# Patient Record
Sex: Male | Born: 1935 | Race: White | Hispanic: No | Marital: Married | Smoking: Never smoker
Health system: Southern US, Community
[De-identification: ages and names within clinical notes are randomized; demographics above are authoritative.]

## PROBLEM LIST (undated history)

## (undated) DIAGNOSIS — I499 Cardiac arrhythmia, unspecified: Secondary | ICD-10-CM

## (undated) DIAGNOSIS — R251 Tremor, unspecified: Secondary | ICD-10-CM

## (undated) DIAGNOSIS — C801 Malignant (primary) neoplasm, unspecified: Secondary | ICD-10-CM

## (undated) DIAGNOSIS — E785 Hyperlipidemia, unspecified: Secondary | ICD-10-CM

## (undated) DIAGNOSIS — C61 Malignant neoplasm of prostate: Secondary | ICD-10-CM

## (undated) DIAGNOSIS — Z8719 Personal history of other diseases of the digestive system: Secondary | ICD-10-CM

## (undated) DIAGNOSIS — I10 Essential (primary) hypertension: Secondary | ICD-10-CM

## (undated) DIAGNOSIS — C4490 Unspecified malignant neoplasm of skin, unspecified: Secondary | ICD-10-CM

## (undated) DIAGNOSIS — E039 Hypothyroidism, unspecified: Secondary | ICD-10-CM

## (undated) DIAGNOSIS — C9590 Leukemia, unspecified not having achieved remission: Secondary | ICD-10-CM

## (undated) DIAGNOSIS — T8859XA Other complications of anesthesia, initial encounter: Secondary | ICD-10-CM

## (undated) DIAGNOSIS — K5790 Diverticulosis of intestine, part unspecified, without perforation or abscess without bleeding: Secondary | ICD-10-CM

## (undated) DIAGNOSIS — T4145XA Adverse effect of unspecified anesthetic, initial encounter: Secondary | ICD-10-CM

## (undated) DIAGNOSIS — K219 Gastro-esophageal reflux disease without esophagitis: Secondary | ICD-10-CM

## (undated) HISTORY — DX: Leukemia, unspecified not having achieved remission: C95.90

## (undated) HISTORY — DX: Malignant neoplasm of prostate: C61

## (undated) HISTORY — DX: Unspecified malignant neoplasm of skin, unspecified: C44.90

## (undated) HISTORY — PX: OTHER SURGICAL HISTORY: SHX169

## (undated) HISTORY — PX: BLADDER TUMOR EXCISION: SHX238

## (undated) HISTORY — PX: HEMORRHOID SURGERY: SHX153

## (undated) HISTORY — PX: CARDIAC CATHETERIZATION: SHX172

## (undated) HISTORY — PX: HERNIA REPAIR: SHX51

## (undated) HISTORY — PX: SHOULDER ARTHROSCOPY: SHX128

## (undated) HISTORY — PX: JOINT REPLACEMENT: SHX530

## (undated) NOTE — *Deleted (*Deleted)
[facility]    SUBJECTIVE: ***   Vitals:   12/16/19 1126 12/16/19 1130 12/16/19 1215 12/16/19 1300  BP: 101/65 119/60 100/66 (!) 100/59  Pulse: 66 65 (!) 59 60  Resp: 18 20 20 20   Temp:      TempSrc:      SpO2: 100% 98% 100% 100%  Weight:      Height:        No intake or output data in the 24 hours ending 12/16/19 1500    PHYSICAL EXAM  General: Well developed, well nourished, in no acute distress HEENT:  Normocephalic and atramatic Neck:  No JVD.  Lungs: Clear bilaterally to auscultation and percussion. Heart: HRRR . Normal S1 and S2 without gallops or murmurs.  Abdomen: Bowel sounds are positive, abdomen soft and non-tender  Msk:  Back normal, normal gait. Normal strength and tone for age. Extremities: No clubbing, cyanosis or edema.   Neuro: Alert and oriented X 3. Psych:  Good affect, responds appropriately   LABS: Basic Metabolic Panel: Recent Labs    12/15/19 2006  NA 139  K 4.3  CL 104  CO2 25  GLUCOSE 152*  BUN 36*  CREATININE 1.48*  CALCIUM 10.2   Liver Function Tests: Recent Labs    12/15/19 2006  AST 21  ALT 14  ALKPHOS 65  BILITOT 0.7  PROT 7.2  ALBUMIN 3.8   No results for input(s): LIPASE, AMYLASE in the last 72 hours. CBC: Recent Labs    12/15/19 2006  WBC 6.6  NEUTROABS 3.7  HGB 12.7*  HCT 37.2*  MCV 105.4*  PLT 129*   Cardiac Enzymes: No results for input(s): CKTOTAL, CKMB, CKMBINDEX, TROPONINI in the last 72 hours. BNP: Invalid input(s): POCBNP D-Dimer: No results for input(s): DDIMER in the last 72 hours. Hemoglobin A1C: No results for input(s): HGBA1C in the last 72 hours. Fasting Lipid Panel: No results for input(s): CHOL, HDL, LDLCALC, TRIG, CHOLHDL, LDLDIRECT in the last 72 hours. Thyroid Function Tests: No results for input(s): TSH, T4TOTAL, T3FREE, THYROIDAB in the last 72 hours.  Invalid input(s): FREET3 Anemia Panel: No results for input(s): VITAMINB12, FOLATE, FERRITIN, TIBC, IRON, RETICCTPCT in the  last 72 hours.  DG Chest 2 View  Result Date: 12/15/2019 CLINICAL DATA:  Upper chest pain which began after eating supper EXAM: CHEST - 2 VIEW COMPARISON:  Esophagram 04/13/2019, radiograph 03/31/2017, CT 07/09/2006 FINDINGS: Large air and fluid containing hiatal hernia which appears similar to most recent to soften g in February though certainly increased in size since 2019. The aorta is calcified. The remaining cardiomediastinal contours are unremarkable. Streaky opacities in the lung bases likely reflect areas atelectatic changes adjacent the hiatal hernia. Additional chronically coarsened interstitial changes are present with apical lucency suggesting some emphysematous changes present comparison CT. No convincing features of edema. No pneumothorax or effusion. No acute osseous or soft tissue abnormality. Degenerative changes are present in the imaged spine and shoulders. IMPRESSION: 1. Large hiatal hernia which appears similar to comparison esophagram from February 2021 though increased in size from more remote comparison is. 2. Streaky opacities in the bases, likely atelectasis and/or scarring. 3. No other acute cardiopulmonary abnormality. 4.  Aortic Atherosclerosis (ICD10-I70.0). Electronically Signed   By: Kreg Shropshire M.D.   On: 12/15/2019 20:18   CT Angio Chest PE W and/or Wo Contrast  Result Date: 12/16/2019 CLINICAL DATA:  New onset atrial fibrillation. Dyspnea and chest pain EXAM: CT ANGIOGRAPHY CHEST WITH CONTRAST TECHNIQUE: Multidetector CT imaging of the chest  was performed using the standard protocol during bolus administration of intravenous contrast. Multiplanar CT image reconstructions and MIPs were obtained to evaluate the vascular anatomy. CONTRAST:  75mL OMNIPAQUE IOHEXOL 350 MG/ML SOLN COMPARISON:  None. FINDINGS: Cardiovascular: Contrast injection is sufficient to demonstrate satisfactory opacification of the pulmonary arteries to the segmental level. There is no pulmonary embolus  or evidence of right heart strain. The size of the main pulmonary artery is normal. Heart size is normal, with no pericardial effusion. The course and caliber of the aorta are normal. There is mild atherosclerotic calcification. Opacification decreased due to pulmonary arterial phase contrast bolus timing. Mediastinum/Nodes: Large hiatal hernia.  No adenopathy. Lungs/Pleura: Airways are patent. No pleural effusion, lobar consolidation, pneumothorax or pulmonary infarction. Upper Abdomen: Contrast bolus timing is not optimized for evaluation of the abdominal organs. The visualized portions of the organs of the upper abdomen are normal. Musculoskeletal: No chest wall abnormality. No bony spinal canal stenosis. Review of the MIP images confirms the above findings. IMPRESSION: 1. No pulmonary embolus or other acute thoracic abnormality. 2. Large hiatal hernia. 3.  Aortic atherosclerosis (ICD10-I70.0). Electronically Signed   By: Deatra Robinson M.D.   On: 12/16/2019 02:58     Echo ***  TELEMETRY: ***:  ASSESSMENT AND PLAN:  Principal Problem:   Chest pain Active Problems:   Thrombocytopenia (HCC)   CLL (chronic lymphocytic leukemia) (HCC)   Acquired hypothyroidism   Benign essential hypertension   Chronic kidney disease (CKD) stage G3a/A1, moderately decreased glomerular filtration rate (GFR) between 45-59 mL/min/1.73 square meter and albuminuria creatinine ratio less than 30 mg/g (HCC)   Diabetes mellitus (HCC)   Hiatal hernia   Postural dizziness with presyncope   New onset Atrial fibrillation with slow ventricular response [facility])   Plan   Plan   Alwyn Pea, MD 12/16/2019 3:00 PM

---

## 2003-12-06 ENCOUNTER — Ambulatory Visit: Payer: Self-pay | Admitting: Internal Medicine

## 2003-12-19 ENCOUNTER — Ambulatory Visit: Payer: Self-pay | Admitting: Internal Medicine

## 2004-04-03 ENCOUNTER — Ambulatory Visit: Payer: Self-pay | Admitting: Internal Medicine

## 2004-04-17 ENCOUNTER — Ambulatory Visit: Payer: Self-pay | Admitting: Internal Medicine

## 2004-10-23 ENCOUNTER — Ambulatory Visit: Payer: Self-pay | Admitting: Ophthalmology

## 2005-09-25 ENCOUNTER — Ambulatory Visit: Payer: Self-pay | Admitting: Physician Assistant

## 2006-02-24 ENCOUNTER — Encounter: Payer: Self-pay | Admitting: Internal Medicine

## 2006-03-20 ENCOUNTER — Encounter: Payer: Self-pay | Admitting: Internal Medicine

## 2006-07-09 ENCOUNTER — Ambulatory Visit: Payer: Self-pay | Admitting: Internal Medicine

## 2006-08-04 ENCOUNTER — Emergency Department: Payer: Self-pay | Admitting: Emergency Medicine

## 2006-08-04 ENCOUNTER — Other Ambulatory Visit: Payer: Self-pay

## 2006-10-16 ENCOUNTER — Ambulatory Visit: Payer: Self-pay | Admitting: Surgery

## 2007-01-25 ENCOUNTER — Ambulatory Visit: Payer: Self-pay | Admitting: Unknown Physician Specialty

## 2007-03-03 ENCOUNTER — Ambulatory Visit: Payer: Self-pay | Admitting: Unknown Physician Specialty

## 2007-03-24 ENCOUNTER — Ambulatory Visit: Payer: Self-pay | Admitting: Unknown Physician Specialty

## 2008-05-11 ENCOUNTER — Emergency Department: Payer: Self-pay | Admitting: Emergency Medicine

## 2009-06-06 ENCOUNTER — Ambulatory Visit: Payer: Self-pay | Admitting: Surgery

## 2009-10-05 ENCOUNTER — Emergency Department: Payer: Self-pay | Admitting: Emergency Medicine

## 2010-02-07 ENCOUNTER — Ambulatory Visit: Payer: Self-pay | Admitting: Surgery

## 2010-02-15 ENCOUNTER — Ambulatory Visit: Payer: Self-pay | Admitting: Surgery

## 2010-02-17 DIAGNOSIS — K5790 Diverticulosis of intestine, part unspecified, without perforation or abscess without bleeding: Secondary | ICD-10-CM

## 2010-02-17 HISTORY — DX: Diverticulosis of intestine, part unspecified, without perforation or abscess without bleeding: K57.90

## 2010-02-19 LAB — PATHOLOGY REPORT

## 2010-04-04 ENCOUNTER — Ambulatory Visit: Payer: Self-pay | Admitting: Internal Medicine

## 2010-08-07 ENCOUNTER — Emergency Department: Payer: Self-pay | Admitting: Emergency Medicine

## 2011-06-27 ENCOUNTER — Ambulatory Visit: Payer: Self-pay | Admitting: Surgery

## 2011-06-30 LAB — PATHOLOGY REPORT

## 2012-02-25 ENCOUNTER — Ambulatory Visit: Payer: Self-pay | Admitting: Orthopedic Surgery

## 2013-01-29 LAB — CBC WITH DIFFERENTIAL/PLATELET
Basophil #: 0 10*3/uL (ref 0.0–0.1)
Basophil %: 0.4 %
Eosinophil #: 0.2 10*3/uL (ref 0.0–0.7)
Eosinophil %: 1.4 %
HGB: 13.9 g/dL (ref 13.0–18.0)
Lymphocyte #: 2.6 10*3/uL (ref 1.0–3.6)
Monocyte %: 10.2 %
Neutrophil #: 7.4 10*3/uL — ABNORMAL HIGH (ref 1.4–6.5)
Neutrophil %: 65.5 %
Platelet: 155 10*3/uL (ref 150–440)
RBC: 4.09 10*6/uL — ABNORMAL LOW (ref 4.40–5.90)
RDW: 12.6 % (ref 11.5–14.5)
WBC: 11.4 10*3/uL — ABNORMAL HIGH (ref 3.8–10.6)

## 2013-01-29 LAB — COMPREHENSIVE METABOLIC PANEL
Albumin: 3.3 g/dL — ABNORMAL LOW (ref 3.4–5.0)
Alkaline Phosphatase: 78 U/L
Bilirubin,Total: 0.8 mg/dL (ref 0.2–1.0)
Calcium, Total: 9.3 mg/dL (ref 8.5–10.1)
Chloride: 105 mmol/L (ref 98–107)
Co2: 29 mmol/L (ref 21–32)
Creatinine: 1.33 mg/dL — ABNORMAL HIGH (ref 0.60–1.30)
EGFR (African American): 59 — ABNORMAL LOW
Glucose: 111 mg/dL — ABNORMAL HIGH (ref 65–99)
Total Protein: 7.6 g/dL (ref 6.4–8.2)

## 2013-01-29 LAB — URINALYSIS, COMPLETE
Bilirubin,UR: NEGATIVE
Ketone: NEGATIVE
Leukocyte Esterase: NEGATIVE
Nitrite: NEGATIVE
Ph: 5 (ref 4.5–8.0)
RBC,UR: <1 /HPF (ref 0–5)
Specific Gravity: 1.014 (ref 1.003–1.030)
WBC UR: <1 /HPF (ref 0–5)

## 2013-01-30 LAB — BASIC METABOLIC PANEL
Anion Gap: 2 — ABNORMAL LOW (ref 7–16)
Calcium, Total: 8.3 mg/dL — ABNORMAL LOW (ref 8.5–10.1)
Chloride: 108 mmol/L — ABNORMAL HIGH (ref 98–107)
Co2: 30 mmol/L (ref 21–32)
EGFR (African American): 57 — ABNORMAL LOW
EGFR (Non-African Amer.): 49 — ABNORMAL LOW
Glucose: 101 mg/dL — ABNORMAL HIGH (ref 65–99)
Osmolality: 280 (ref 275–301)
Potassium: 4.4 mmol/L (ref 3.5–5.1)

## 2013-01-30 LAB — CBC WITH DIFFERENTIAL/PLATELET
Basophil #: 0.1 10*3/uL (ref 0.0–0.1)
Basophil %: 0.8 %
HCT: 35.3 % — ABNORMAL LOW (ref 40.0–52.0)
Lymphocyte %: 49 %
MCH: 34.1 pg — ABNORMAL HIGH (ref 26.0–34.0)
MCHC: 33.4 g/dL (ref 32.0–36.0)
MCV: 102 fL — ABNORMAL HIGH (ref 80–100)
Monocyte #: 0.7 x10 3/mm (ref 0.2–1.0)
Neutrophil #: 2.2 10*3/uL (ref 1.4–6.5)
Neutrophil %: 34.6 %
Platelet: 122 10*3/uL — ABNORMAL LOW (ref 150–440)
RBC: 3.46 10*6/uL — ABNORMAL LOW (ref 4.40–5.90)
RDW: 12.5 % (ref 11.5–14.5)
WBC: 6.4 10*3/uL (ref 3.8–10.6)

## 2013-01-31 ENCOUNTER — Inpatient Hospital Stay: Payer: Self-pay | Admitting: Internal Medicine

## 2013-01-31 LAB — CBC WITH DIFFERENTIAL/PLATELET
Basophil #: 0.1 10*3/uL (ref 0.0–0.1)
Basophil %: 1.1 %
Eosinophil %: 5.2 %
HCT: 34.1 % — ABNORMAL LOW (ref 40.0–52.0)
HGB: 11.6 g/dL — ABNORMAL LOW (ref 13.0–18.0)
Lymphocyte #: 2.6 10*3/uL (ref 1.0–3.6)
Lymphocyte %: 48.3 %
MCH: 34.2 pg — ABNORMAL HIGH (ref 26.0–34.0)
MCHC: 33.9 g/dL (ref 32.0–36.0)
MCV: 101 fL — ABNORMAL HIGH (ref 80–100)
Monocyte #: 0.6 x10 3/mm (ref 0.2–1.0)
Monocyte %: 10.4 %

## 2013-01-31 LAB — BASIC METABOLIC PANEL
Anion Gap: 4 — ABNORMAL LOW (ref 7–16)
BUN: 11 mg/dL (ref 7–18)
Calcium, Total: 8.1 mg/dL — ABNORMAL LOW (ref 8.5–10.1)
EGFR (African American): >60
EGFR (Non-African Amer.): 54 — ABNORMAL LOW
Potassium: 3.9 mmol/L (ref 3.5–5.1)

## 2013-02-01 LAB — OCCULT BLOOD X 1 CARD TO LAB, STOOL: Occult Blood, Feces: NEGATIVE

## 2013-05-02 DIAGNOSIS — I10 Essential (primary) hypertension: Secondary | ICD-10-CM | POA: Diagnosis present

## 2013-05-02 DIAGNOSIS — E119 Type 2 diabetes mellitus without complications: Secondary | ICD-10-CM

## 2013-05-02 DIAGNOSIS — E785 Hyperlipidemia, unspecified: Secondary | ICD-10-CM | POA: Insufficient documentation

## 2013-05-22 ENCOUNTER — Emergency Department: Payer: Self-pay | Admitting: Emergency Medicine

## 2013-05-22 LAB — CBC
HCT: 41.8 % (ref 40.0–52.0)
HGB: 13.6 g/dL (ref 13.0–18.0)
MCH: 32.8 pg (ref 26.0–34.0)
MCHC: 32.5 g/dL (ref 32.0–36.0)
MCV: 101 fL — ABNORMAL HIGH (ref 80–100)
Platelet: 130 10*3/uL — ABNORMAL LOW (ref 150–440)
RBC: 4.13 10*6/uL — ABNORMAL LOW (ref 4.40–5.90)
RDW: 13.2 % (ref 11.5–14.5)
WBC: 4.9 10*3/uL (ref 3.8–10.6)

## 2013-05-22 LAB — COMPREHENSIVE METABOLIC PANEL
ALBUMIN: 3.9 g/dL (ref 3.4–5.0)
ALK PHOS: 68 U/L
Anion Gap: 6 — ABNORMAL LOW (ref 7–16)
BUN: 21 mg/dL — ABNORMAL HIGH (ref 7–18)
Bilirubin,Total: 0.6 mg/dL (ref 0.2–1.0)
CHLORIDE: 107 mmol/L (ref 98–107)
Calcium, Total: 8.8 mg/dL (ref 8.5–10.1)
Co2: 26 mmol/L (ref 21–32)
Creatinine: 1.3 mg/dL (ref 0.60–1.30)
EGFR (African American): >60
EGFR (Non-African Amer.): 52 — ABNORMAL LOW
Glucose: 90 mg/dL (ref 65–99)
Osmolality: 280 (ref 275–301)
Potassium: 4.3 mmol/L (ref 3.5–5.1)
SGOT(AST): 18 U/L (ref 15–37)
SGPT (ALT): 18 U/L (ref 12–78)
SODIUM: 139 mmol/L (ref 136–145)
Total Protein: 8 g/dL (ref 6.4–8.2)

## 2013-05-22 LAB — URINALYSIS, COMPLETE
Bacteria: NONE SEEN
Bilirubin,UR: NEGATIVE
Blood: NEGATIVE
GLUCOSE, UR: NEGATIVE mg/dL (ref 0–75)
KETONE: NEGATIVE
Leukocyte Esterase: NEGATIVE
Nitrite: NEGATIVE
Ph: 5 (ref 4.5–8.0)
Protein: NEGATIVE
Specific Gravity: 1.016 (ref 1.003–1.030)
Squamous Epithelial: <1

## 2013-08-10 ENCOUNTER — Ambulatory Visit: Payer: Self-pay | Admitting: Surgery

## 2013-08-13 LAB — PATHOLOGY REPORT

## 2014-05-30 ENCOUNTER — Observation Stay
Admit: 2014-05-30 | Disposition: A | Discharge status: Home / Self Care | Payer: Self-pay | Attending: Internal Medicine | Admitting: Internal Medicine

## 2014-05-30 LAB — BASIC METABOLIC PANEL
ANION GAP: 5 — AB (ref 7–16)
BUN: 24 mg/dL — AB
CHLORIDE: 103 mmol/L
CREATININE: 1.29 mg/dL — AB
Calcium, Total: 9.4 mg/dL
Co2: 28 mmol/L
EGFR (African American): >60
GFR CALC NON AF AMER: 52 — AB
GLUCOSE: 171 mg/dL — AB
Potassium: 4.3 mmol/L
Sodium: 136 mmol/L

## 2014-05-30 LAB — CBC
HCT: 40.3 % (ref 40.0–52.0)
HGB: 13.3 g/dL (ref 13.0–18.0)
MCH: 33.6 pg (ref 26.0–34.0)
MCHC: 33.1 g/dL (ref 32.0–36.0)
MCV: 101 fL — AB (ref 80–100)
PLATELETS: 126 10*3/uL — AB (ref 150–440)
RBC: 3.97 10*6/uL — AB (ref 4.40–5.90)
RDW: 13.3 % (ref 11.5–14.5)
WBC: 10.1 10*3/uL (ref 3.8–10.6)

## 2014-05-30 LAB — TROPONIN I
TROPONIN-I: 0.04 ng/mL — AB
Troponin-I: 0.03 ng/mL

## 2014-05-31 LAB — CK-MB: CK-MB: 1 ng/mL

## 2014-05-31 LAB — TROPONIN I: Troponin-I: 0.04 ng/mL — ABNORMAL HIGH

## 2014-05-31 LAB — BASIC METABOLIC PANEL
ANION GAP: 4 — AB (ref 7–16)
BUN: 27 mg/dL — AB
Calcium, Total: 8.5 mg/dL — ABNORMAL LOW
Chloride: 107 mmol/L
Co2: 26 mmol/L
Creatinine: 1.23 mg/dL
EGFR (African American): >60
GFR CALC NON AF AMER: 55 — AB
GLUCOSE: 144 mg/dL — AB
POTASSIUM: 4.6 mmol/L
SODIUM: 137 mmol/L

## 2014-05-31 LAB — CBC WITH DIFFERENTIAL/PLATELET
BASOS ABS: 0 10*3/uL (ref 0.0–0.1)
Basophil %: 0.1 %
Eosinophil #: 0 10*3/uL (ref 0.0–0.7)
Eosinophil %: 0 %
HCT: 37.2 % — ABNORMAL LOW (ref 40.0–52.0)
HGB: 12.4 g/dL — ABNORMAL LOW (ref 13.0–18.0)
LYMPHS PCT: 16 %
Lymphocyte #: 1.4 10*3/uL (ref 1.0–3.6)
MCH: 33.6 pg (ref 26.0–34.0)
MCHC: 33.3 g/dL (ref 32.0–36.0)
MCV: 101 fL — ABNORMAL HIGH (ref 80–100)
Monocyte #: 0.5 x10 3/mm (ref 0.2–1.0)
Monocyte %: 5.3 %
NEUTROS PCT: 78.6 %
Neutrophil #: 7 10*3/uL — ABNORMAL HIGH (ref 1.4–6.5)
Platelet: 126 10*3/uL — ABNORMAL LOW (ref 150–440)
RBC: 3.68 10*6/uL — ABNORMAL LOW (ref 4.40–5.90)
RDW: 13.4 % (ref 11.5–14.5)
WBC: 8.9 10*3/uL (ref 3.8–10.6)

## 2014-05-31 LAB — LIPID PANEL
Cholesterol: 183 mg/dL
HDL Cholesterol: 38 mg/dL — ABNORMAL LOW
Ldl Cholesterol, Calc: 124 mg/dL — ABNORMAL HIGH
Triglycerides: 106 mg/dL
VLDL CHOLESTEROL, CALC: 21 mg/dL

## 2014-06-01 LAB — CBC WITH DIFFERENTIAL/PLATELET
BASOS PCT: 0.1 %
Basophil #: 0 10*3/uL (ref 0.0–0.1)
EOS ABS: 0 10*3/uL (ref 0.0–0.7)
EOS PCT: 0.1 %
HCT: 36.1 % — ABNORMAL LOW (ref 40.0–52.0)
HGB: 11.9 g/dL — ABNORMAL LOW (ref 13.0–18.0)
LYMPHS PCT: 24.6 %
Lymphocyte #: 1.8 10*3/uL (ref 1.0–3.6)
MCH: 33.2 pg (ref 26.0–34.0)
MCHC: 32.9 g/dL (ref 32.0–36.0)
MCV: 101 fL — ABNORMAL HIGH (ref 80–100)
Monocyte #: 0.5 x10 3/mm (ref 0.2–1.0)
Monocyte %: 6.8 %
NEUTROS PCT: 68.4 %
Neutrophil #: 5 10*3/uL (ref 1.4–6.5)
PLATELETS: 122 10*3/uL — AB (ref 150–440)
RBC: 3.58 10*6/uL — AB (ref 4.40–5.90)
RDW: 13.2 % (ref 11.5–14.5)
WBC: 7.4 10*3/uL (ref 3.8–10.6)

## 2014-06-01 LAB — BASIC METABOLIC PANEL
Anion Gap: 6 — ABNORMAL LOW (ref 7–16)
BUN: 34 mg/dL — ABNORMAL HIGH
CHLORIDE: 109 mmol/L
CREATININE: 1.22 mg/dL
Calcium, Total: 8.1 mg/dL — ABNORMAL LOW
Co2: 23 mmol/L
EGFR (African American): >60
EGFR (Non-African Amer.): 56 — ABNORMAL LOW
Glucose: 131 mg/dL — ABNORMAL HIGH
POTASSIUM: 4.3 mmol/L
Sodium: 138 mmol/L

## 2014-06-09 NOTE — H&P (Signed)
PATIENT NAME:  Allen Chang, Allen Chang MR#:  [id number] DATE OF BIRTH:  May 14, 1932  DATE OF ADMISSION:  01/29/2013  PRIMARY CARE PHYSICIAN:  Dr. Ginette Pitman.   CHIEF COMPLAINT:  Left lower quadrant abdominal pain.   HISTORY OF PRESENT ILLNESS:  This is a very pleasant 81 year old male with a history of hypertension, tremors, who presents with the above complaint. Over the past few days, the patient has had increasing left lower quadrant abdominal pain to the point that he has had decreased p.o. appetite. He came in this morning due to the severity of the pain, rating at about an 8/10, no exacerbating or relieving factors. He does state that over the past couple days, he has had more popcorn and nuts, and he notes he is not supposed to be eating these.    REVIEW OF SYSTEMS: CONSTITUTIONAL:  No fatigue, fever and weakness. No weight loss or weight gain.  EYES:  No blurred or double vision, glaucoma or cataracts.  EAR, NOSE, THROAT: No ear pain, hearing loss, seasonal allergies, postnasal drip.  RESPIRATORY:   No cough, wheezing, hemoptysis, COPD.  CARDIOVASCULAR: No chest pain, orthopnea, edema, dyspnea on exertion, palpitations or syncope.  GASTROINTESTINAL:  No nausea, vomiting, diarrhea. Positive left lower quadrant abdominal pain, no melena or ulcers. GENITOURINARY:  No dysuria, hematuria.  ENDOCRINE:  No polyuria or polydipsia.  HEMATOLOGIC AND LYMPHATICS:  No anemia or easy bruising.  SKIN:  No rash or lesions.  MUSCULOSKELETAL:  No limited activity. He is a Psychologist, sport and exercise.  NEUROLOGIC:  No history of CVA, TIA or seizures.  PSYCHIATRIC:  No history of anxiety or depression.   PAST MEDICAL HISTORY:  1.  Hypertension.  2.  Prostate cancer.  3.  Tremors.  4.  GERD.   MEDICATIONS:  1.  Primidone 50 mg daily.  2.  Lisinopril 10 mg daily.  3.  Omeprazole 20 mg daily.   ALLERGIES:  No known drug allergies.    PAST SURGICAL HISTORY: 1.  Hernia.  2.  Polyps.  3.  Hemorrhoidectomy.  4.  Prostate cancer  seeds.   SOCIAL HISTORY:  No tobacco, alcohol or drug use.   FAMILY HISTORY:  No history of hypertension or cancers.   PHYSICAL EXAMINATION:  VITAL SIGNS:  Temperature is 98.3, pulse is 75, respirations 20, blood pressure 125/74, 94% on room air.  GENERAL:  The patient is alert, oriented, not in acute distress.  HEENT:  Head is atraumatic. Pupils are round and reactive. Sclerae anicteric. Mucous membranes are moist.  OROPHARYNX:  Clear.  NECK:  Supple without JVD, carotid bruit or enlarged thyroid.  CARDIOVASCULAR:  Regular rate and rhythm. No murmurs, gallops, or rubs. PMI is not displaced.  LUNGS:  Clear to auscultation without crackles, rales, rhonchi or wheezing. Normal to percussion. Normal respiratory rise and fall.  GASTROINTESTINAL:  He has got diffuse tenderness at the left lower quadrant with some guarding. No rebound. No hernia is noted. Good bowel sounds.   Abdomen is nondistended. No ecchymosis or rigidity.  MUSCULOSKELETAL:  No pathology to digits or nails. Extremities move x 4.  SKIN:  Inspection within normal limits, well-hydrated, no diaphoresis  NEUROLOGIC:  Cranial nerves II through XII are grossly intact. Motor strength is 4/4 bilateral upper and lower.  BACK:  No CVA or vertebral tenderness.   LABORATORY, DIAGNOSTIC AND RADIOLOGICAL DATA:  White blood cells is 11.4, hemoglobin 13.9, hematocrit 41.1, platelets are 155, sodium 136, potassium 4.5, chloride 105, bicarbonate 29, BUN 18, creatinine 1.33, glucose is 111. Bilirubin  is 0.8, alk phos 78, AST is 23, ALT 18, total protein 7.6, albumin 3.3.   Urinalysis shows no LCE or nitrites.   CT scan of the abdomen showed mild, uncomplicated low descending colon diverticulitis. No abscess or extraluminal air.   ASSESSMENT AND PLAN:  This is a 80-year-old male who presents with left lower quadrant pain, found to have a diverticulitis.  1.  Diverticulitis. The patient does have some significant guarding but no rebound on  physical examination. His CT is positive for diverticulitis but no complications so this is an uncomplicated diverticulitis. I have admitted the patient for observation. He will need IV antibiotics, clear liquid diet and surgical consultation with Dr. Wilson Smith, his previous surgeon. The patient should avoid any nuts and seeds and we will ask for a dietian consult as well.  2.  Hypertension. We will continue lisinopril.  3.  A history tremors. Continue primidone.  4.  A history of gastroesophageal reflux disease. Continue omeprazole.   CODE STATUS:  The patient is FULL CODE.   TIME SPENT:  Approximately 40 minutes.   ____________________________  P. , MD spm:jm D: 01/29/2013 11:24:00 ET T: 01/29/2013 11:53:38 ET JOB#: [id number]  cc:  P. , MD, <Dictator> Vishwanath Hande, MD  P  MD ELECTRONICALLY SIGNED 01/29/2013 13:29 

## 2014-06-09 NOTE — Discharge Summary (Signed)
PATIENT NAME:  CHRISTIAAN, STREBECK MR#:  [id number] DATE OF BIRTH:  Jun 02, 1932  DATE OF ADMISSION:  01/31/2013 DATE OF DISCHARGE:  02/01/2013  DIAGNOSES AT TIME OF DISCHARGE:  1.  Acute diverticulitis.  2.  Hypertension.  3.  History of tremors. 4.  History of gastroesophageal reflux disease.   CHIEF COMPLAINT:  Left lower quadrant abdominal pain.  HISTORY OF PRESENT ILLNESS: Allen Chang is a 80 year old man with a history of hypertension and tremors, who presented to the ED complaining of the left lower quadrant abdominal pain. The patient states that the pain had been increasing gradually, and also stated that his appetite was down. He denies any vomiting. No fevers. No chills.   PAST MEDICAL HISTORY: Significant hypertension, prostate cancer, tremors and gastroesophageal reflux disease.   PHYSICAL EXAMINATION:  VITAL SIGNS: Temp was 98.3, pulse was 75, respirations 20, blood pressure 125/74, O2 sat 94% on room air.  GENERAL:  He was not in distress.  HEENT:  [place]/AT. Oropharynx was clear.  NECK: supple.  HEART: S1, S2.  RESPIRATORY:  Lungs were clear to auscultation.  ABDOMEN:  Soft. Left lower quadrant tenderness noted with some guarding. No rebound. EXTREMITIES:  No edema.  NEUROLOGIC:  Nonfocal.   LABORATORY DATA: WBC count 11.4, hemoglobin 13.9, hematocrit 41.1, platelets 155. Sodium 136, potassium 4.5, chloride 105, bicarb 29, BUN 18, creatinine 1.32, glucose 111, bilirubin 0.8. Alk phos was 78. AST was 23, ALT of 18.   CT scan of the abdomen showed mild uncomplicated descending colon diverticulitis. No abscess or extraluminal air.  The patient was admitted to [facility] and started on intravenous Cipro and Flagyl. He was also seen in consultation by surgeon, Dr. Jamal Collin, who felt that he could be continued on his antibiotic regimen, and the patient symptomatically improved. His diet was gradually advanced, and he was stable at the time of discharge. The patient was discharged home on the  following medications: Cipro 5 mg p.o. bid for 10 days, Flagyl 5 mg t.i.d. for 10 days, docusate sodium 100 mg p.o. b.i.d., omeprazole 20 mg once a day, lisinopril 20 mg once a day, multivitamin 1 tablet a day, and primidone 50 mg p.o. b.i.d.   The patient was advised low-sodium diet and follow up with me, Dr. Ginette Pitman, in 1 to 2 weeks' time.   Total time spent on discharging this patient, 35 minutes.   ____________________________ Tracie Harrier, MD vh:dmm D: 02/01/2013 12:08:00 ET T: 02/01/2013 12:39:08 ET JOB#: [id number]  cc: Tracie Harrier, MD, <Dictator> Tracie Harrier MD ELECTRONICALLY SIGNED 02/08/2013 17:19

## 2014-06-09 NOTE — Consult Note (Signed)
PATIENT NAME:  Allen Chang, Allen Chang MR#:  [id number] DATE OF BIRTH:  [birth year]  DATE OF CONSULTATION:  01/29/2013  REQUESTING PHYSICIAN:  Dr. Bettey Costa    CONSULTING PHYSICIAN:  S.G. Jamal Collin, MD  REASON FOR CONSULTATION: Sigmoid diverticulitis.   HISTORY OF PRESENT ILLNESS:  This is a pleasant 81 year old male who was doing well up until 2 days ago when he developed some pain in the left side of the abdomen. The pain has since persisted and got more and more uncomfortable and he presented to the Emergency Room today. His bowel movements have been normal. He has had no nausea or vomiting and no fever or chills. The pain is fairly significant and fairly well localized to the left side of the abdomen in the mid to lower part. The patient reports that he had had a similar episode many years ago and was told he had a mild diverticulitis. He has had no intervening symptoms since that time until now.   PAST MEDICAL HISTORY: The patient has had a history of prostate cancer with radiation seeds and apparently has done well. He has had a previous left inguinal hernia repair and hemorrhoidectomy. He has history of colon polyps and has had surveillance colonoscopies and is due for one according to him, the mid part of next year. He has hypertension, under control and history of some tremors and GERD.   PHYSICAL EXAMINATION: GENERAL: The patient appears to be a very pleasant male who was not in any acute distress.  VITAL SIGNS:  He is afebrile. His vital signs are noted to be in normal range  NECK: Supple. No nodes or masses palpable.  EYES:  Clear with no icterus.  LUNGS: Clear to auscultation and percussion.  HEART: Sinus rhythm without any murmurs.  ABDOMEN: Reveals some mild distention and some fairly well localized tenderness in the left mid abdominal area, encroaching the left lower quadrant. There is some minimal voluntary guarding and mild rebound. Bowel sounds are active. No hernias noted in the left  groin area, but the patient does have a small right inguinal hernia.   LABORATORY DATA: Shows white count of 11,000. His chemistries are remarkable for creatinine of 1.3, BUN is 18. A CT scan has been done. This was reviewed and shows there is a focal area of diverticulitis in the lower descending colon, and this corresponds in location to the site of the patient's pain. This is fairly well localized with no evidence of free fluid or any loculated fluid or abscess. No free air is identified. A small right inguinal hernia was also noted on this exam.   IMPRESSION: A patient with diverticulitis involving the left lower descending colon which appears to be fairly mild at this point.   RECOMMENDATIONS: The patient can be continued on current antibiotic and if stable and improved, can be discharged home in the next day or 2 and continue treatment as outpatient with oral antibiotics. The right inguinal hernia is relatively small and the patient was not even aware of this and it is obviously asymptomatic and can be followed. If any further developments occur within this hospital stay and requires repeat evaluation, I will be available and I will let Dr. [name] about the patient's admission here since he has done his prior surgeries.   Thank you for allowing me to evaluate and help in the care of this patient.     ____________________________ S.Robinette Haines, MD sgs:dp D: 01/29/2013 14:54:24 ET T: 01/29/2013 15:34:31 ET JOB#: [id number]  cc: S.G. Jamal Collin, MD, <Dictator> [facility] Robinette Haines MD ELECTRONICALLY SIGNED 01/30/2013 10:10

## 2014-06-11 NOTE — Op Note (Signed)
PATIENT NAME:  Allen Chang, Allen Chang MR#:  [id number] DATE OF BIRTH:  January 12, 1933  DATE OF PROCEDURE:  06/27/2011  PREOPERATIVE DIAGNOSIS: History colonic polyps.   POSTOPERATIVE DIAGNOSES:  1. Colonic polyp.  2. Diverticulosis.   PROCEDURES:  1. Colonoscopy. 2. Snare polypectomy.   SURGEON: Loreli Dollar, MD   ANESTHESIA: Intravenous sedation.   INDICATIONS: This 81 year old male had a history of a tubular adenoma removed from the ascending colon and another tubular adenoma removed from the rectum and a hyperplastic polyp removed from the rectum in 2011, also previous findings of diverticulosis and is now brought back for follow-up colonoscopy.   PROCEDURE: The patient was placed on the stretcher in the left lateral decubitus position and monitored with pulse oximetry, intermittent blood pressure recordings, and cardiac monitor. He was given oxygen via nasal cannula at a rate of 2 L/min. He was sedated by the anesthesia staff.   The Olympus video colonoscope was inserted through the rectum and manipulated up into the colon. There was marked tortuosity of the colon and advanced the scope around to the ascending colon and appeared to be very near the cecum but did not identify the ileocecal valve. The light was far into the right lower abdomen. Scope was gradually pulled back. Colon preparation was good. A small amount of bilious feculent material was aspirated. The mucosa was examined and this part of the bowel saw no polyps or tumors. The scope was gradually pulled back and did identify a number of diverticula of the sigmoid colon. The scope was pulled back to some 12 cm from the anal os and there was a finding of a small polyp on the anterior wall which initially did a biopsy. There was some scant bleeding and subsequently the polyp was completely resected with the snare and cautery. There was no bleeding after the polypectomy and the polyp was retrieved and submitted with the biopsy in formalin  for routine pathology. The scope was reinserted and passed back up to the polypectomy site. Hemostasis was intact. The scope was gradually pulled back seeing no other polyps in the rectum. The scope was retroflexed to view the distal rectum which appeared normal. The scope was then removed. Digital anorectal exam demonstrated no palpable mass.   The patient tolerated the procedure satisfactorily and was then moved to the recovery room for postoperative care.   ____________________________ J. [name], MD jws:drc D: 06/27/2011 09:18:25 ET T: 06/27/2011 11:15:44 ET JOB#: [id number]  cc: Loreli Dollar, MD, <Dictator> Loreli Dollar MD ELECTRONICALLY SIGNED 06/27/2011 18:55

## 2014-06-12 ENCOUNTER — Ambulatory Visit
Admit: 2014-06-12 | Disposition: A | Discharge status: Home / Self Care | Payer: Self-pay | Attending: Family Medicine | Admitting: Family Medicine

## 2014-06-18 NOTE — Consult Note (Signed)
PATIENT NAME:  Allen Chang, GEDNEY MR#:  [id number] DATE OF BIRTH:  1932/07/11  DATE OF CONSULTATION:  05/31/2014  REFERRING PHYSICIAN:   CONSULTING PHYSICIAN:  Isaias Cowman, MD  PRIMARY CARE PHYSICIAN: Tracie Harrier, MD  CHIEF COMPLAINT: Chest pain.   REASON FOR CONSULTATION: Consultation requested for evaluation of chest pain and elevated troponin.   HISTORY OF PRESENT ILLNESS: The patient is a 81 year old gentleman with history of essential hypertension, who presents with new onset chest pain. The patient reports that he was in his usual state of health until the day of admission when he developed substernal chest discomfort which was severe in nature, rated 8/10. Discomfort radiated up to his neck and jaw. The patient went to the local fire station where apparent telemetry strip was performed. The episode lasted 20 to 25 minutes. He was brought to [facility] Emergency Room via EMS. Initial EKG was unremarkable. The patient had borderline elevated troponin of 0.04. The patient was admitted to telemetry where he has remained chest pain-free. He ate a full breakfast this morning.   PAST MEDICAL HISTORY: 1.  Essential hypertension.  2.  Gastroesophageal reflux disease.   MEDICATIONS: Lisinopril 20 mg daily, Aleve 220 mg b.i.d., primidone 50 mg b.i.d., flaxseed oil 1 capsule daily, Colace 100 mg b.i.d., MiraLax 17 grams daily, fish oil capsules 1000 mg daily, Prilosec 20 mg daily.   SOCIAL HISTORY: The patient denies tobacco abuse.   FAMILY HISTORY: Positive for coronary artery disease.   REVIEW OF SYSTEMS: CONSTITUTIONAL: No fever or chills.  EYES: No blurry vision.  EARS: No hearing loss.  RESPIRATORY: No shortness of breath.  CARDIOVASCULAR: Chest pain as described above.  GASTROINTESTINAL: The patient has had a history of reflux disease.  GENITOURINARY: No dysuria or hematuria.  ENDOCRINE: No polyuria or polydipsia.  MUSCULOSKELETAL: No arthralgias or myalgias.  NEUROLOGICAL: No  focal muscle weakness or numbness.  PSYCHOLOGICAL: No depression or anxiety.   PHYSICAL EXAMINATION: VITAL SIGNS: Blood pressure 143/81, pulse 72, respirations 18, temperature 97.7, pulse oximetry 96%.  HEENT: Pupils equal and reactive to light and accommodation.  NECK: Supple without thyromegaly.  LUNGS: Clear.  CARDIOVASCULAR: Normal JVP. Normal PMI. Regular rate and rhythm. Normal S1, S2. No appreciable gallop, murmur, or rub.  ABDOMEN: Soft and nontender. Pulses were intact bilaterally.  MUSCULOSKELETAL: Normal muscle tone.  NEUROLOGIC: The patient is alert and oriented x 3. Motor and sensory both grossly intact.   IMPRESSION: A 81 year old gentleman with new onset chest pain lasting 20 to 25 minutes with borderline elevated troponin and nondiagnostic EKG. Presentation worrisome for unstable angina. The patient had full breakfast today.   RECOMMENDATIONS: 1. Agree with overall current therapy.  2. After a lengthy discussion with the patient considering the risks, benefits, and alternatives of stress test versus cardiac catheterization, we have agreed to proceed with cardiac catheterization in light of severe symptoms at rest. The risks, benefits, and alternatives were explained and informed written consent was obtained. Cardiac catheterization is scheduled for 06/01/2014.     ____________________________ Isaias Cowman, MD ap:at D: 05/31/2014 09:23:35 ET T: 05/31/2014 09:50:19 ET JOB#: [id number]  cc: Isaias Cowman, MD, <Dictator> Isaias Cowman MD ELECTRONICALLY SIGNED 06/13/2014 17:17

## 2014-06-18 NOTE — Discharge Summary (Signed)
PATIENT NAME:  Allen Chang, Allen Chang MR#:  [id number] DATE OF BIRTH:  17-Jun-1932  DATE OF ADMISSION:  05/30/2014 DATE OF DISCHARGE:  06/01/2014  DISCHARGE DIAGNOSES:  1.  Chest pain with cardiac catheterization showing normal coronaries.  2.  Gastroesophageal reflux disease.  3.  Acute renal failure secondary to dehydration.  4.  Hypertension.   CHIEF COMPLAINT: Chest pain.   HISTORY OF PRESENT ILLNESS: Allen Chang is a 81 year old gentleman with a history of hypertension and gastroesophageal reflux disease who presents to the ED complaining of chest pain. The patient describes pain as retrosternal in location and rates it as 7 to 8 out of 10, sharp in quality and lasted approximately 30 minutes. The patient managed to drive home and subsequently came to the Emergency Room for further evaluation.   PAST MEDICAL HISTORY: Significant for hypertension and GERD. Please see H and P for full details.   HOSPITAL COURSE: The patient was admitted to [facility]. Initial labs showed a mild elevation of troponin of 0.04. He was also seen by cardiology. On initial labs, glucose 171, BUN 24, creatinine 1.29 and that improved to 1.22. Troponin was 0.04. Hemoglobin initially was 13.3, WBC count 10.1, and platelet count 126,000. The patient underwent cardiac catheterization by Dr. Saralyn Pilar, cardiologist, and EF was calculated at 54%. The coronary circulation was left dominant. Angiography showed minor luminal irregularities of the proximal LAD. Distal LAD also showed minor luminal irregularities, but otherwise normal. The patient was stable. He was chest pain-free. He was ambulated and it was felt that his chest pain is most likely noncardiac. He was discharged in stable condition.  DISCHARGE MEDICATIONS:  1.  Aspirin 81 mg a day. 2.  Atorvastatin 20 mg once daily. 3.  Omeprazole 20 mg p.o. b.i.d. 4.  Polyethylene glycol 17 grams once a day. 5.  Flaxseed oil 1 capsule once a day. 6.  Bifidobacterium infantis 4 mg once  a day. 7.  Docusate sodium 1 capsule b.i.d. 8.  Lisinopril 20 mg once a day. 9.  Multivitamin 1 tablet once a day. 10.  Fish oil 1000 mg once a day. 11.  Primidone 50 mg p.o. b.i.d.  DISCHARGE INSTRUCTIONS: He has been advised to stop using NSAIDs such as Aleve and advised to follow up with me, Dr. Ginette Pitman, in 1 to 2 weeks' time. He has been advised to call the office with any questions or concerns. The patient is stable at the time of discharge.   TOTAL TIME SPENT IN DISCHARGING THE PATIENT: 35 minutes.  ____________________________ Tracie Harrier, MD vh:sb D: 06/02/2014 13:02:59 ET T: 06/02/2014 14:08:32 ET JOB#: [id number]  cc: Tracie Harrier, MD, <Dictator> Tracie Harrier MD ELECTRONICALLY SIGNED 06/14/2014 13:28

## 2014-06-18 NOTE — H&P (Signed)
PATIENT NAME:  Allen Chang, Allen Chang MR#:  [id number] DATE OF BIRTH:  Jul 11, 1932  DATE OF ADMISSION:  05/30/2014  REFERRING PHYSICIAN:  Larae Grooms, MD.    PRIMARY CARE PHYSICIAN:  Dr. Ginette Pitman, [facility].    CHIEF COMPLAINT:  Chest pain.     HISTORY OF PRESENT ILLNESS:  A 81 year old Caucasian male with history of  hypertension essential, gastroesophageal reflux disease, presenting with chest pain described as acute onset of chest pain which occurred at rest, retrosternal location, is was sharp in quality, radiation to head, intensity 7-8 out of 10. No worsening or relieving factors. No associated symptoms. Total lasted about 20-25 minutes. Symptoms currently resolved. No further chest pain.   REVIEW OF SYSTEMS:   CONSTITUTIONAL: Denies fevers, chills, fatigue, weakness.  EYES: No blurred vision, double vision, or eye pain.  EARS, NOSE, AND THROAT:  Denies tinnitus, ear pain, hearing loss,  RESPIRATORY:  Denies cough, wheeze, shortness of breath.  CARDIOVASCULAR: Positive for chest pain as described above.  Denies palpitations or edema.  GASTROINTESTINAL: Denies nausea, vomiting, diarrhea, abdominal pain.   GENITOURINARY:  Denies dysuria or hematuria.   ENDOCRINE:  Denies nocturia or thyroid problems.   HEMATOLOGIC:  Denies easy bruising, bleeding.  SKIN:  Denies rashes, lesion. MUSCULOSKELETAL: Denies pain in the neck, back, shoulders, knees, hips, or arthritic symptoms.  NEUROLOGIC: Denies paralysis, paresthesias.  PSYCHIATRIC: Denies anxiety or depressive symptoms.   Otherwise full review of systems by me is negative.   PAST MEDICAL HISTORY: Essential hypertension, gastroesophageal reflux disease,  history of prostate cancer.   SOCIAL HISTORY: No alcohol, tobacco, or drug usage.    FAMILY HISTORY:  Positive for coronary artery disease.   ALLERGIES: TO VOLTAREN.    HOME MEDICATIONS: Include Aleve 220 mg p.o. b.i.d., lisinopril 20 mg p.o. daily, primidone 50 mg p.o. b.i.d.,  flaxseed oil 1 capsule daily, Colace 100 mg p.o. b.i.d., MiraLax 17 grams daily, fish oil 1000 mg p.o. daily, Prilosec 20 mg p.o. daily.   PHYSICAL EXAMINATION:  VITAL SIGNS: Temperature 98.3, heart rate 69, respirations 18, blood pressure 155/76, saturating 100% on room air. Weight 78.5 kilos, BMI 26.3.  GENERAL: Well-nourished, well-developed Caucasian gentleman, in no distress.  HEAD: Normocephalic, atraumatic.  EYES: Pupils equal, reactive to light. Extraocular muscles intact. No scleral icterus. MOUTH: Moist mucosal membranes. Dentition intact. No abscess noted.  EARS, NOSE, AND THROAT: Clear without exudates. No external lesions. NECK: Supple. No thyromegaly or nodules. No JVD. PULMONARY: Clear to auscultation bilaterally without wheezes, rales, rhonchi  Good respiratory effort.   CHEST:  Nontender to palpation.  CARDIOVASCULAR: S1, S2, regular rate and rhythm.  No murmurs, rubs, or gallops. No edema. Pedal pulses 2 + bilaterally.  GASTROINTESTINAL: Soft, nontender, nondistended. No masses.  Positive bowel sounds.  No hepatosplenomegaly.   MUSCULOSKELETAL: No swelling, clubbing, or edema. Range of motion is full in all extremities.   NEUROLOGIC:  Cranial nerves II-XII intact. No gross focal neurologic deficits. Sensation intact. Reflexes intact.  SKIN: No ulceration, lesion, rash, or cyanosis. Skin warm and dry, turgor intact. PSYCHIATRIC: Mood and affect within normal limits. The patient awake, alert, and oriented x 3.  Sensation intact.    LABORATORY DATA: Sodium 136, potassium 4.3, chloride 103, bicarbonate 28, BUN 24, creatinine 1.29, glucose 171. Troponin 0.04. WBC 10.1, hemoglobin 13.3, platelets of 126,000. EKG performed, normal sinus rhythm, no ST or T wave abnormalities, occasional PVCs.   ASSESSMENT AND PLAN:  A 81 year old Caucasian man with history of hypertension essential, gastroesophageal reflux disease presents  with chest pain.    1.  Chest pain, central in location.   Initiate aspirin and statin therapy. Place on telemetry. Cardiac symptoms x 3.   2.  Acute kidney injury: IV fluid hydration. Hold ACE inhibitors. Follow urine output and renal function.   3.  Hypertension, essential. Continue with home medications.  4.  Gastroesophageal esophageal reflux diseasewithout esophagitis PPI therapy.   5.  Venous thromboembolism prophylaxis.  Heparin subcutaneous.    CODE STATUS: Full code.    TIME SPENT:  45 minutes.    ____________________________ Aaron Mose. Hower, MD dkh:bu D: 05/30/2014 20:42:48 ET T: 05/30/2014 21:05:13 ET JOB#: [id number]  cc: Aaron Mose. Hower, MD, <Dictator> DAVID Woodfin Ganja MD ELECTRONICALLY SIGNED 05/31/2014 2:13

## 2014-09-27 ENCOUNTER — Other Ambulatory Visit: Payer: Self-pay | Admitting: Surgery

## 2014-09-27 DIAGNOSIS — M75102 Unspecified rotator cuff tear or rupture of left shoulder, not specified as traumatic: Secondary | ICD-10-CM

## 2014-09-27 DIAGNOSIS — M1712 Unilateral primary osteoarthritis, left knee: Secondary | ICD-10-CM

## 2014-09-27 DIAGNOSIS — M65812 Other synovitis and tenosynovitis, left shoulder: Secondary | ICD-10-CM

## 2014-09-29 ENCOUNTER — Other Ambulatory Visit: Payer: Self-pay

## 2014-09-29 ENCOUNTER — Emergency Department: Payer: Medicare Other

## 2014-09-29 ENCOUNTER — Emergency Department
Admission: EM | Admit: 2014-09-29 | Discharge: 2014-09-30 | Disposition: A | Discharge status: Home / Self Care | Payer: Medicare Other | Attending: Emergency Medicine | Admitting: Emergency Medicine

## 2014-09-29 DIAGNOSIS — K8020 Calculus of gallbladder without cholecystitis without obstruction: Secondary | ICD-10-CM | POA: Insufficient documentation

## 2014-09-29 DIAGNOSIS — R109 Unspecified abdominal pain: Secondary | ICD-10-CM

## 2014-09-29 DIAGNOSIS — R079 Chest pain, unspecified: Secondary | ICD-10-CM | POA: Diagnosis present

## 2014-09-29 DIAGNOSIS — I10 Essential (primary) hypertension: Secondary | ICD-10-CM | POA: Diagnosis not present

## 2014-09-29 DIAGNOSIS — Z79899 Other long term (current) drug therapy: Secondary | ICD-10-CM | POA: Diagnosis not present

## 2014-09-29 HISTORY — DX: Essential (primary) hypertension: I10

## 2014-09-29 LAB — BASIC METABOLIC PANEL
ANION GAP: 8 (ref 5–15)
BUN: 35 mg/dL — AB (ref 6–20)
CALCIUM: 9.6 mg/dL (ref 8.9–10.3)
CHLORIDE: 104 mmol/L (ref 101–111)
CO2: 26 mmol/L (ref 22–32)
Creatinine, Ser: 1.55 mg/dL — ABNORMAL HIGH (ref 0.61–1.24)
GFR calc Af Amer: 47 mL/min — ABNORMAL LOW (ref >60)
GFR calc non Af Amer: 41 mL/min — ABNORMAL LOW (ref >60)
GLUCOSE: 136 mg/dL — AB (ref 65–99)
Potassium: 4.2 mmol/L (ref 3.5–5.1)
Sodium: 138 mmol/L (ref 135–145)

## 2014-09-29 LAB — CBC
HEMATOCRIT: 41.3 % (ref 40.0–52.0)
HEMOGLOBIN: 13.8 g/dL (ref 13.0–18.0)
MCH: 33.7 pg (ref 26.0–34.0)
MCHC: 33.5 g/dL (ref 32.0–36.0)
MCV: 100.7 fL — ABNORMAL HIGH (ref 80.0–100.0)
Platelets: 147 10*3/uL — ABNORMAL LOW (ref 150–440)
RBC: 4.1 MIL/uL — ABNORMAL LOW (ref 4.40–5.90)
RDW: 13.2 % (ref 11.5–14.5)
WBC: 9 10*3/uL (ref 3.8–10.6)

## 2014-09-29 LAB — AMYLASE: Amylase: 38 U/L (ref 28–100)

## 2014-09-29 LAB — TROPONIN I: Troponin I: <0.03 ng/mL (ref <0.031)

## 2014-09-29 LAB — LIPASE, BLOOD: LIPASE: 16 U/L — AB (ref 22–51)

## 2014-09-29 MED ORDER — MORPHINE SULFATE 4 MG/ML IJ SOLN
INTRAMUSCULAR | Status: AC
  Administered 2014-09-29: 4 mg via INTRAVENOUS
  Filled 2014-09-29: qty 1

## 2014-09-29 MED ORDER — ONDANSETRON HCL 4 MG/2ML IJ SOLN
INTRAMUSCULAR | Status: AC
  Administered 2014-09-29: 4 mg via INTRAVENOUS
  Filled 2014-09-29: qty 2

## 2014-09-29 MED ORDER — MORPHINE SULFATE 4 MG/ML IJ SOLN
4.0000 mg | Freq: Once | INTRAMUSCULAR | Status: AC
  Administered 2014-09-29: 4 mg via INTRAVENOUS

## 2014-09-29 MED ORDER — ONDANSETRON HCL 4 MG/2ML IJ SOLN
4.0000 mg | Freq: Once | INTRAMUSCULAR | Status: AC
  Administered 2014-09-29: 4 mg via INTRAVENOUS

## 2014-09-29 NOTE — ED Provider Notes (Signed)
[facility] Emergency Department Provider Note ___________________________________________  Time seen: Approximately 9:29 PM  I have reviewed the triage vital signs and the nursing notes.   HISTORY  Chief Complaint Abdominal Pain and Chest Pain  HPI Allen Chang is a 81 y.o. male who is complaining that he's having severe right upper quadrant and midepigastric abdominal pain this been off and on today but got much worse this evening. Patient states that he was out of his tractor vomiting and the pain just doubled him over. Patient was diagnosed back in April with gallstones and even had a cardiac catheter at that time that showed he had normal coronary arteries. Patient denies any associated vomiting or change in his bowels but states that he did get some nauseated when the pain was really persistent. Patient states that he is just at the point where he doesn't feel like he can handle this pain at home. Patient denies any fever, chills, urinary symptoms, or again change in his bowels. Patient states his pain on scale of 0-10 right now is about a 2 but it was up to a 10 prior to arrival.   Past Medical History  Diagnosis Date  . Hypertension     There are no active problems to display for this patient.   Past Surgical History  Procedure Laterality Date  . Cardiac catheterization      Current Outpatient Rx  Name  Route  Sig  Dispense  Refill  . Flaxseed, Linseed, (FLAX SEED OIL) 1000 MG CAPS   Oral   Take 1 capsule by mouth daily.         Marland Kitchen lisinopril (PRINIVIL,ZESTRIL) 20 MG tablet   Oral   Take 20 mg by mouth daily.         . Multiple Vitamins-Minerals (MULTIVITAMIN WITH MINERALS) tablet   Oral   Take 1 tablet by mouth daily.         . Omega-3 Fatty Acids (FISH OIL) 1000 MG CAPS   Oral   Take 1 capsule by mouth daily.         Marland Kitchen omeprazole (PRILOSEC) 20 MG capsule   Oral   Take 20 mg by mouth daily.         . primidone  (MYSOLINE) 50 MG tablet   Oral   Take 50 mg by mouth 2 (two) times daily.           Allergies Voltaren  History reviewed. No pertinent family history.  Social History Social History  Substance Use Topics  . Smoking status: Never Smoker   . Smokeless tobacco: Never Used  . Alcohol Use: No    Review of Systems Constitutional: No fever/chills Eyes: No visual changes. ENT: No sore throat. Cardiovascular: Denies chest pain. Respiratory: Denies shortness of breath. Gastrointestinal: Severe midepigastric and right upper quadrant abdominal pain that doubled him over in pain..  Mild associated nausea but, no vomiting.  No diarrhea.  No constipation. Genitourinary: Negative for dysuria. Musculoskeletal: Negative for back pain. Skin: Negative for rash. Neurological: Negative for headaches, focal weakness or numbness. 10-point ROS otherwise negative.  ____________________________________________   PHYSICAL EXAM:  VITAL SIGNS: ED Triage Vitals  Enc Vitals Group     BP 09/29/14 1827 162/81 mmHg     Pulse Rate 09/29/14 1827 61     Resp 09/29/14 1827 20     Temp 09/29/14 1827 98 F (36.7 C)     Temp Source 09/29/14 1827 Oral     SpO2 09/29/14  1827 99 %     Weight 09/29/14 1827 174 lb (78.926 kg)     Height 09/29/14 1827 5\' 9"  (1.753 m)     Head Cir --      Peak Flow --      Pain Score 09/29/14 1827 6     Pain Loc --      Pain Edu? --      Excl. in [place]? --     Constitutional: Alert and oriented. Well appearing and in mild distress secondary to his pain. Patient though states his pain has eased up some since he's in the ER. While he was in the waiting room patient was given some morphine and Zofran and he said that helped tremendously.. Eyes: Conjunctivae are normal. PERRL. EOMI. Head: Atraumatic. Nose: No congestion/rhinnorhea. Mouth/Throat: Mucous membranes are moist.  Oropharynx non-erythematous. Neck: No stridor.   Cardiovascular: Normal rate, regular rhythm. Grossly  normal heart sounds.  Good peripheral circulation. Respiratory: Normal respiratory effort.  No retractions. Lungs CTAB. Gastrointestinal: Soft and tender to palpation in his midepigastric and right upper quadrant with positive Murphy sign.. No distention. No abdominal bruits. No CVA tenderness. Musculoskeletal: No lower extremity tenderness nor edema.  No joint effusions. Neurologic:  Normal speech and language. No gross focal neurologic deficits are appreciated. No gait instability. Skin:  Skin is warm, dry and intact. No rash noted. Psychiatric: Mood and affect are normal. Speech and behavior are normal.  ____________________________________________   LABS (all labs ordered are listed, but only abnormal results are displayed)  Labs Reviewed  BASIC METABOLIC PANEL - Abnormal; Notable for the following:    Glucose, Bld 136 (*)    BUN 35 (*)    Creatinine, Ser 1.55 (*)    GFR calc non Af Amer 41 (*)    GFR calc Af Amer 47 (*)    All other components within normal limits  CBC - Abnormal; Notable for the following:    RBC 4.10 (*)    MCV 100.7 (*)    Platelets 147 (*)    All other components within normal limits  LIPASE, BLOOD - Abnormal; Notable for the following:    Lipase 16 (*)    All other components within normal limits  TROPONIN I  AMYLASE   ____________________________________________  EKG  ED ECG REPORT I, Ruby Cola, the attending physician, personally viewed and interpreted this ECG.   Date: 09/29/2014  EKG Time: 1821  Rate: 60  Rhythm: normal sinus rhythm  Axis: Normal axis deviation  Intervals:none  ST&T Change: No changes  ____________________________________________  RADIOLOGY ultrasound pending  Dg Chest 2 View  09/29/2014   CLINICAL DATA:  Sudden onset of epigastric/chest pain, nausea  EXAM: CHEST  2 VIEW  COMPARISON:  05/30/2014  FINDINGS: Lungs are clear.  No pleural effusion or pneumothorax.  The heart is normal in size.  Degenerative changes  of the visualized thoracolumbar spine.  IMPRESSION: No evidence of acute cardiopulmonary disease.   Electronically Signed   By: Julian Hy M.D.   On: 09/29/2014 19:26    ____________________________________________   PROCEDURES  Procedure(s) performed: None  Critical Care performed: No  ____________________________________________   INITIAL IMPRESSION / ASSESSMENT AND PLAN / ED COURSE  Pertinent labs & imaging results that were available during my care of the patient were reviewed by me and considered in my medical decision making (see chart for details).  ----------------------------------------- 10:27 PM on 09/29/2014 ----------------------------------------- Patient is going to get a repeat ultrasound. Ultrasound reviewed  from April 2016 did show that he had multiple gallstones. Patient is comfortable on his pain at this time and is awaiting ultrasound.  ____________________________________________ ----------------------------------------- 11:41 PM on 09/29/2014 -----------------------------------------  Patient has been discussed with Dr. Burt Knack who is awaiting his ultrasound results.Pt will be signed out to Dr. Thomasene Lot at 12 midnight.  FINAL CLINICAL IMPRESSION(S) / ED DIAGNOSES  Final diagnoses:  Acute abdominal pain  Gallstones  Intractable pain      Ruby Cola, MD 09/29/14 2343

## 2014-09-29 NOTE — ED Notes (Signed)
Pt reports epigastric pain x 1 day, worse when trying to swallow.  Pt reports similar pain in past and dx with gall stones.  Pt NAD at this time.

## 2014-09-29 NOTE — ED Notes (Signed)
Pt c/o sudden onset epigastric/chest pain that started around 5pm or just before with nausa..denies SOB or other sx..states he had similar sx in the past 6 months and had cardiac cath that was normal, states after cath, had ultrasound that showed a gall stone.Marland Kitchen

## 2014-09-30 ENCOUNTER — Emergency Department: Payer: Medicare Other

## 2014-09-30 DIAGNOSIS — K8020 Calculus of gallbladder without cholecystitis without obstruction: Secondary | ICD-10-CM | POA: Diagnosis not present

## 2014-09-30 MED ORDER — OXYCODONE-ACETAMINOPHEN 5-325 MG PO TABS
0.5000 | ORAL_TABLET | ORAL | Status: DC | PRN
Start: 2014-09-30 — End: 2014-12-14

## 2014-09-30 NOTE — ED Provider Notes (Signed)
-----------------------------------------   2:16 AM on 09/30/2014 -----------------------------------------  The patient's ultrasound shows cholelithiasis but no other abnormality. He is not having any thickening. There is no cholecystic fluid. His white blood cell count is normal at 9000. He has no elevation of LFTs.  At this time, he is pain-free and appears comfortable. He does report he had some pain earlier and that comes and goes. The family tells me that he has had 3-4 attacks total, including his first episode in May. They're concerned because he attacks seem to be occurring more frequently.  I have spoken with Dr. Phoebe Perch about the patient's condition and ultrasound report. He advises that the patient can go home if his pain is under control. They will see him in the office on Monday.  I have spoke with the patient about the options of going home versus admission to the hospital. He prefers to go home.   I will prescribe Percocet for him. Given his advanced age, I will ask him to take only half a tablet of time, at least until he and his family see how the medication affects him.    Final diagnosis: Cholelithiasis Pain, right upper quadrant  Ahmed Prima, MD 09/30/14 0225

## 2014-09-30 NOTE — ED Notes (Signed)
Pt taken to US

## 2014-09-30 NOTE — Discharge Instructions (Signed)
You do have a gallstone, however there is no sign of swelling or inflammation around the gallbladder. There is no fluid around the gallbladder. Your blood tests overall look good. We spoke of admission to the hospital for observation versus discharge home. He preferred to go home if possible. This was discussed with Dr. Phoebe Perch. He thinks it's reasonable for you to go home and follow-up with him or his colleagues in the office on Monday. Take Percocet if needed for the pain. Take one half of a tablet, but if the pain is more severely may take one whole tablet. Return to the emergency department if your pain is uncontrolled, if you have any fevers, if you're throwing up, or feel other urgent concerns.  Biliary Colic  Biliary colic is a steady or irregular pain in the upper abdomen. It is usually under the right side of the rib cage. It happens when gallstones interfere with the normal flow of bile from the gallbladder. Bile is a liquid that helps to digest fats. Bile is made in the liver and stored in the gallbladder. When you eat a meal, bile passes from the gallbladder through the cystic duct and the common bile duct into the small intestine. There, it mixes with partially digested food. If a gallstone blocks either of these ducts, the normal flow of bile is blocked. The muscle cells in the bile duct contract forcefully to try to move the stone. This causes the pain of biliary colic.  SYMPTOMS   A person with biliary colic usually complains of pain in the upper abdomen. This pain can be:  In the center of the upper abdomen just below the breastbone.  In the upper-right part of the abdomen, near the gallbladder and liver.  Spread back toward the right shoulder blade.  Nausea and vomiting.  The pain usually occurs after eating.  Biliary colic is usually triggered by the digestive system's demand for bile. The demand for bile is high after fatty meals. Symptoms can also occur when a person  who has been fasting suddenly eats a very large meal. Most episodes of biliary colic pass after 1 to 5 hours. After the most intense pain passes, your abdomen may continue to ache mildly for about 24 hours. DIAGNOSIS  After you describe your symptoms, your caregiver will perform a physical exam. He or she will pay attention to the upper right portion of your belly (abdomen). This is the area of your liver and gallbladder. An ultrasound will help your caregiver look for gallstones. Specialized scans of the gallbladder may also be done. Blood tests may be done, especially if you have fever or if your pain persists. PREVENTION  Biliary colic can be prevented by controlling the risk factors for gallstones. Some of these risk factors, such as heredity, increasing age, and pregnancy are a normal part of life. Obesity and a high-fat diet are risk factors you can change through a healthy lifestyle. Women going through menopause who take hormone replacement therapy (estrogen) are also more likely to develop biliary colic. TREATMENT   Pain medication may be prescribed.  You may be encouraged to eat a fat-free diet.  If the first episode of biliary colic is severe, or episodes of colic keep retuning, surgery to remove the gallbladder (cholecystectomy) is usually recommended. This procedure can be done through small incisions using an instrument called a laparoscope. The procedure often requires a brief stay in the hospital. Some people can leave the hospital the same day. It  is the most widely used treatment in people troubled by painful gallstones. It is effective and safe, with no complications in more than 90% of cases.  If surgery cannot be done, medication that dissolves gallstones may be used. This medication is expensive and can take months or years to work. Only small stones will dissolve.  Rarely, medication to dissolve gallstones is combined with a procedure called shock-wave lithotripsy. This  procedure uses carefully aimed shock waves to break up gallstones. In many people treated with this procedure, gallstones form again within a few years. PROGNOSIS  If gallstones block your cystic duct or common bile duct, you are at risk for repeated episodes of biliary colic. There is also a 25% chance that you will develop a gallbladder infection(acute cholecystitis), or some other complication of gallstones within 10 to 20 years. If you have surgery, schedule it at a time that is convenient for you and at a time when you are not sick. HOME CARE INSTRUCTIONS   Drink plenty of clear fluids.  Avoid fatty, greasy or fried foods, or any foods that make your pain worse.  Take medications as directed. SEEK MEDICAL CARE IF:   You develop a fever over 100.5 F (38.1 C).  Your pain gets worse over time.  You develop nausea that prevents you from eating and drinking.  You develop vomiting. SEEK IMMEDIATE MEDICAL CARE IF:   You have continuous or severe belly (abdominal) pain which is not relieved with medications.  You develop nausea and vomiting which is not relieved with medications.  You have symptoms of biliary colic and you suddenly develop a fever and shaking chills. This may signal cholecystitis. Call your caregiver immediately.  You develop a yellow color to your skin or the white part of your eyes (jaundice). Document Released: 07/07/2005 Document Revised: 04/28/2011 Document Reviewed: 09/16/2007 [facility] Patient Information 2015 [place], [place]. This information is not intended to replace advice given to you by your health care provider. Make sure you discuss any questions you have with your health care provider.

## 2014-10-05 ENCOUNTER — Ambulatory Visit
Admission: RE | Admit: 2014-10-05 | Discharge: 2014-10-05 | Disposition: A | Discharge status: Home / Self Care | Payer: Medicare Other | Source: Ambulatory Visit | Attending: Surgery | Admitting: Surgery

## 2014-10-05 DIAGNOSIS — X58XXXA Exposure to other specified factors, initial encounter: Secondary | ICD-10-CM | POA: Diagnosis not present

## 2014-10-05 DIAGNOSIS — M1712 Unilateral primary osteoarthritis, left knee: Secondary | ICD-10-CM | POA: Diagnosis present

## 2014-10-05 DIAGNOSIS — M75112 Incomplete rotator cuff tear or rupture of left shoulder, not specified as traumatic: Secondary | ICD-10-CM | POA: Insufficient documentation

## 2014-10-05 DIAGNOSIS — M65812 Other synovitis and tenosynovitis, left shoulder: Secondary | ICD-10-CM

## 2014-10-05 DIAGNOSIS — M75102 Unspecified rotator cuff tear or rupture of left shoulder, not specified as traumatic: Secondary | ICD-10-CM | POA: Diagnosis present

## 2014-10-05 DIAGNOSIS — M7552 Bursitis of left shoulder: Secondary | ICD-10-CM | POA: Diagnosis not present

## 2014-10-05 DIAGNOSIS — S83242A Other tear of medial meniscus, current injury, left knee, initial encounter: Secondary | ICD-10-CM | POA: Insufficient documentation

## 2014-10-09 ENCOUNTER — Encounter
Admission: RE | Admit: 2014-10-09 | Discharge: 2014-10-09 | Disposition: A | Discharge status: Home / Self Care | Payer: Medicare Other | Source: Ambulatory Visit | Attending: Surgery | Admitting: Surgery

## 2014-10-09 DIAGNOSIS — N183 Chronic kidney disease, stage 3 (moderate): Secondary | ICD-10-CM | POA: Diagnosis not present

## 2014-10-09 DIAGNOSIS — Z7982 Long term (current) use of aspirin: Secondary | ICD-10-CM | POA: Diagnosis not present

## 2014-10-09 DIAGNOSIS — R1013 Epigastric pain: Secondary | ICD-10-CM | POA: Diagnosis not present

## 2014-10-09 DIAGNOSIS — K8010 Calculus of gallbladder with chronic cholecystitis without obstruction: Secondary | ICD-10-CM | POA: Diagnosis not present

## 2014-10-09 DIAGNOSIS — M1990 Unspecified osteoarthritis, unspecified site: Secondary | ICD-10-CM | POA: Diagnosis not present

## 2014-10-09 DIAGNOSIS — Z885 Allergy status to narcotic agent status: Secondary | ICD-10-CM | POA: Diagnosis not present

## 2014-10-09 DIAGNOSIS — M25512 Pain in left shoulder: Secondary | ICD-10-CM | POA: Diagnosis not present

## 2014-10-09 DIAGNOSIS — Z886 Allergy status to analgesic agent status: Secondary | ICD-10-CM | POA: Diagnosis not present

## 2014-10-09 DIAGNOSIS — R001 Bradycardia, unspecified: Secondary | ICD-10-CM | POA: Diagnosis not present

## 2014-10-09 DIAGNOSIS — Z7901 Long term (current) use of anticoagulants: Secondary | ICD-10-CM | POA: Diagnosis not present

## 2014-10-09 DIAGNOSIS — Z86010 Personal history of colonic polyps: Secondary | ICD-10-CM | POA: Diagnosis not present

## 2014-10-09 DIAGNOSIS — Z8262 Family history of osteoporosis: Secondary | ICD-10-CM | POA: Diagnosis not present

## 2014-10-09 DIAGNOSIS — R079 Chest pain, unspecified: Secondary | ICD-10-CM | POA: Diagnosis not present

## 2014-10-09 DIAGNOSIS — M94269 Chondromalacia, unspecified knee: Secondary | ICD-10-CM | POA: Diagnosis not present

## 2014-10-09 DIAGNOSIS — Z9889 Other specified postprocedural states: Secondary | ICD-10-CM | POA: Diagnosis not present

## 2014-10-09 DIAGNOSIS — Z8249 Family history of ischemic heart disease and other diseases of the circulatory system: Secondary | ICD-10-CM | POA: Diagnosis not present

## 2014-10-09 DIAGNOSIS — M1712 Unilateral primary osteoarthritis, left knee: Secondary | ICD-10-CM | POA: Diagnosis not present

## 2014-10-09 DIAGNOSIS — K219 Gastro-esophageal reflux disease without esophagitis: Secondary | ICD-10-CM | POA: Diagnosis not present

## 2014-10-09 DIAGNOSIS — Z79891 Long term (current) use of opiate analgesic: Secondary | ICD-10-CM | POA: Diagnosis not present

## 2014-10-09 DIAGNOSIS — Z807 Family history of other malignant neoplasms of lymphoid, hematopoietic and related tissues: Secondary | ICD-10-CM | POA: Diagnosis not present

## 2014-10-09 DIAGNOSIS — I129 Hypertensive chronic kidney disease with stage 1 through stage 4 chronic kidney disease, or unspecified chronic kidney disease: Secondary | ICD-10-CM | POA: Diagnosis not present

## 2014-10-09 HISTORY — DX: Hyperlipidemia, unspecified: E78.5

## 2014-10-09 HISTORY — DX: Gastro-esophageal reflux disease without esophagitis: K21.9

## 2014-10-09 HISTORY — DX: Adverse effect of unspecified anesthetic, initial encounter: T41.45XA

## 2014-10-09 HISTORY — DX: Tremor, unspecified: R25.1

## 2014-10-09 HISTORY — DX: Other complications of anesthesia, initial encounter: T88.59XA

## 2014-10-09 HISTORY — DX: Malignant (primary) neoplasm, unspecified: C80.1

## 2014-10-09 NOTE — Patient Instructions (Addendum)
  Your procedure is scheduled on: 10/09/14 @ 12:00 noon Report to Day Surgery     __x__ 1. Do not eat food or drink liquids after midnight. No gum chewing or hard candies.     ____ 2. No Alcohol for 24 hours before or after surgery.   ____ 3. Bring all medications with you on the day of surgery if instructed.    __x__ 4. Notify your doctor if there is any change in your medical condition     (cold, fever, infections).     Do not wear jewelry, make-up, hairpins, clips or nail polish.  Do not wear lotions, powders, or perfumes. You may wear deodorant.  Do not shave 48 hours prior to surgery. Men may shave face and neck.  Do not bring valuables to the hospital.    [facility] is not responsible for any belongings or valuables.               Contacts, dentures or bridgework may not be worn into surgery.  Leave your suitcase in the car. After surgery it may be brought to your room.  For patients admitted to the hospital, discharge time is determined by your                treatment team.   Patients discharged the day of surgery will not be allowed to drive home.   Please read over the following fact sheets that you were given:      ____ Take these medicines the morning of surgery with A SIP OF WATER:    1. lisinopril (PRINIVIL,ZESTRIL) 20 MG tablet  2. omeprazole (PRILOSEC) 20 MG capsule  3. primidone (MYSOLINE) 50 MG tablet  4.  5.  6.  ____ Fleet Enema (as directed)   _x___ Use CHG Soap as directed  ____ Use inhalers on the day of surgery  ____ Stop metformin 2 days prior to surgery    ____ Take 1/2 of usual insulin dose the night before surgery and none on the morning of surgery.   __x__ Stop Coumadin/Plavix/aspirin on Stopped Aspirin 1 week ago  ____ Stop Anti-inflammatories on    _x_ Stop supplements until after surgery.  Stop fish oil today  ____ Bring C-Pap to the hospital.

## 2014-10-10 ENCOUNTER — Ambulatory Visit: Payer: Medicare Other

## 2014-10-10 ENCOUNTER — Encounter
Admission: RE | Disposition: A | Discharge status: Home / Self Care | Payer: Self-pay | Source: Ambulatory Visit | Attending: Internal Medicine

## 2014-10-10 ENCOUNTER — Ambulatory Visit: Payer: Medicare Other | Admitting: Anesthesiology

## 2014-10-10 ENCOUNTER — Observation Stay
Admission: RE | Admit: 2014-10-10 | Discharge: 2014-10-11 | Disposition: A | Discharge status: Home / Self Care | Payer: Medicare Other | Source: Ambulatory Visit | Attending: Internal Medicine | Admitting: Internal Medicine

## 2014-10-10 ENCOUNTER — Encounter: Payer: Self-pay | Admitting: *Deleted

## 2014-10-10 ENCOUNTER — Other Ambulatory Visit: Payer: Self-pay | Admitting: Internal Medicine

## 2014-10-10 DIAGNOSIS — M94269 Chondromalacia, unspecified knee: Secondary | ICD-10-CM | POA: Insufficient documentation

## 2014-10-10 DIAGNOSIS — Z8262 Family history of osteoporosis: Secondary | ICD-10-CM | POA: Insufficient documentation

## 2014-10-10 DIAGNOSIS — M25512 Pain in left shoulder: Secondary | ICD-10-CM | POA: Insufficient documentation

## 2014-10-10 DIAGNOSIS — Z807 Family history of other malignant neoplasms of lymphoid, hematopoietic and related tissues: Secondary | ICD-10-CM | POA: Insufficient documentation

## 2014-10-10 DIAGNOSIS — R001 Bradycardia, unspecified: Secondary | ICD-10-CM | POA: Diagnosis present

## 2014-10-10 DIAGNOSIS — Z86010 Personal history of colonic polyps: Secondary | ICD-10-CM | POA: Insufficient documentation

## 2014-10-10 DIAGNOSIS — Z7901 Long term (current) use of anticoagulants: Secondary | ICD-10-CM | POA: Insufficient documentation

## 2014-10-10 DIAGNOSIS — Z7982 Long term (current) use of aspirin: Secondary | ICD-10-CM | POA: Insufficient documentation

## 2014-10-10 DIAGNOSIS — N209 Urinary calculus, unspecified: Secondary | ICD-10-CM

## 2014-10-10 DIAGNOSIS — Z885 Allergy status to narcotic agent status: Secondary | ICD-10-CM | POA: Insufficient documentation

## 2014-10-10 DIAGNOSIS — K219 Gastro-esophageal reflux disease without esophagitis: Secondary | ICD-10-CM | POA: Insufficient documentation

## 2014-10-10 DIAGNOSIS — K8010 Calculus of gallbladder with chronic cholecystitis without obstruction: Secondary | ICD-10-CM | POA: Diagnosis not present

## 2014-10-10 DIAGNOSIS — R079 Chest pain, unspecified: Secondary | ICD-10-CM | POA: Insufficient documentation

## 2014-10-10 DIAGNOSIS — I129 Hypertensive chronic kidney disease with stage 1 through stage 4 chronic kidney disease, or unspecified chronic kidney disease: Secondary | ICD-10-CM | POA: Insufficient documentation

## 2014-10-10 DIAGNOSIS — R1013 Epigastric pain: Secondary | ICD-10-CM | POA: Insufficient documentation

## 2014-10-10 DIAGNOSIS — Z9889 Other specified postprocedural states: Secondary | ICD-10-CM | POA: Insufficient documentation

## 2014-10-10 DIAGNOSIS — Z8249 Family history of ischemic heart disease and other diseases of the circulatory system: Secondary | ICD-10-CM | POA: Insufficient documentation

## 2014-10-10 DIAGNOSIS — M1990 Unspecified osteoarthritis, unspecified site: Secondary | ICD-10-CM | POA: Insufficient documentation

## 2014-10-10 DIAGNOSIS — Z79891 Long term (current) use of opiate analgesic: Secondary | ICD-10-CM | POA: Insufficient documentation

## 2014-10-10 DIAGNOSIS — Z886 Allergy status to analgesic agent status: Secondary | ICD-10-CM | POA: Insufficient documentation

## 2014-10-10 DIAGNOSIS — M1712 Unilateral primary osteoarthritis, left knee: Secondary | ICD-10-CM | POA: Insufficient documentation

## 2014-10-10 DIAGNOSIS — N183 Chronic kidney disease, stage 3 (moderate): Secondary | ICD-10-CM | POA: Insufficient documentation

## 2014-10-10 HISTORY — PX: CHOLECYSTECTOMY: SHX55

## 2014-10-10 LAB — BASIC METABOLIC PANEL
Anion gap: 6 (ref 5–15)
BUN: 18 mg/dL (ref 6–20)
CHLORIDE: 104 mmol/L (ref 101–111)
CO2: 27 mmol/L (ref 22–32)
CREATININE: 1.29 mg/dL — AB (ref 0.61–1.24)
Calcium: 8.9 mg/dL (ref 8.9–10.3)
GFR calc non Af Amer: 51 mL/min — ABNORMAL LOW (ref >60)
GFR, EST AFRICAN AMERICAN: 59 mL/min — AB (ref >60)
Glucose, Bld: 141 mg/dL — ABNORMAL HIGH (ref 65–99)
Potassium: 4.7 mmol/L (ref 3.5–5.1)
SODIUM: 137 mmol/L (ref 135–145)

## 2014-10-10 LAB — CBC
HCT: 39 % — ABNORMAL LOW (ref 40.0–52.0)
HEMOGLOBIN: 12.8 g/dL — AB (ref 13.0–18.0)
MCH: 33.2 pg (ref 26.0–34.0)
MCHC: 32.8 g/dL (ref 32.0–36.0)
MCV: 101.2 fL — ABNORMAL HIGH (ref 80.0–100.0)
Platelets: 116 10*3/uL — ABNORMAL LOW (ref 150–440)
RBC: 3.85 MIL/uL — AB (ref 4.40–5.90)
RDW: 13 % (ref 11.5–14.5)
WBC: 13.8 10*3/uL — AB (ref 3.8–10.6)

## 2014-10-10 LAB — MRSA PCR SCREENING: MRSA BY PCR: NEGATIVE

## 2014-10-10 SURGERY — LAPAROSCOPIC CHOLECYSTECTOMY WITH INTRAOPERATIVE CHOLANGIOGRAM
Anesthesia: General | Wound class: Clean

## 2014-10-10 MED ORDER — GARLIC 1000 MG PO CAPS: 1.0000 | ORAL_CAPSULE | Freq: Every morning | ORAL | Status: DC

## 2014-10-10 MED ORDER — SODIUM CHLORIDE 0.9 % IV SOLN
INTRAVENOUS | Status: DC | PRN
  Administered 2014-10-10: 700 mL via INTRAMUSCULAR

## 2014-10-10 MED ORDER — FLAX SEED OIL 1000 MG PO CAPS: 1.0000 | ORAL_CAPSULE | Freq: Every day | ORAL | Status: DC

## 2014-10-10 MED ORDER — FENTANYL CITRATE (PF) 100 MCG/2ML IJ SOLN
INTRAMUSCULAR | Status: AC
  Filled 2014-10-10: qty 2

## 2014-10-10 MED ORDER — OMEGA-3-ACID ETHYL ESTERS 1 G PO CAPS
1.0000 g | ORAL_CAPSULE | Freq: Every day | ORAL | Status: DC
  Administered 2014-10-11: 1 g via ORAL
  Filled 2014-10-10: qty 1

## 2014-10-10 MED ORDER — OXYCODONE-ACETAMINOPHEN 5-325 MG PO TABS: 1.0000 | ORAL_TABLET | ORAL | Status: DC | PRN

## 2014-10-10 MED ORDER — PROPOFOL 10 MG/ML IV BOLUS
INTRAVENOUS | Status: DC | PRN
  Administered 2014-10-10: 120 mg via INTRAVENOUS
  Administered 2014-10-10: 30 mg via INTRAVENOUS
  Administered 2014-10-10: 20 mg via INTRAVENOUS

## 2014-10-10 MED ORDER — LISINOPRIL 10 MG PO TABS
10.0000 mg | ORAL_TABLET | Freq: Every day | ORAL | Status: DC
  Administered 2014-10-11: 10 mg via ORAL
  Filled 2014-10-10 (×2): qty 1

## 2014-10-10 MED ORDER — HEPARIN SODIUM (PORCINE) 5000 UNIT/ML IJ SOLN
INTRAMUSCULAR | Status: AC
  Filled 2014-10-10: qty 1

## 2014-10-10 MED ORDER — ROCURONIUM BROMIDE 100 MG/10ML IV SOLN
INTRAVENOUS | Status: DC | PRN
  Administered 2014-10-10: 10 mg via INTRAVENOUS
  Administered 2014-10-10: 35 mg via INTRAVENOUS
  Administered 2014-10-10: 5 mg via INTRAVENOUS

## 2014-10-10 MED ORDER — LORATADINE 10 MG PO TBDP: 10.0000 mg | ORAL_TABLET | Freq: Every day | ORAL | Status: DC

## 2014-10-10 MED ORDER — GLUCOSAMINE-CHONDROITIN 500-400 MG PO TABS: 1.0000 | ORAL_TABLET | Freq: Two times a day (BID) | ORAL | Status: DC

## 2014-10-10 MED ORDER — ONDANSETRON HCL 4 MG/2ML IJ SOLN
INTRAMUSCULAR | Status: AC
  Filled 2014-10-10: qty 2

## 2014-10-10 MED ORDER — ENOXAPARIN SODIUM 40 MG/0.4ML ~~LOC~~ SOLN: 40.0000 mg | SUBCUTANEOUS | Status: DC

## 2014-10-10 MED ORDER — ACETAMINOPHEN 500 MG PO TABS
1000.0000 mg | ORAL_TABLET | Freq: Once | ORAL | Status: AC
  Administered 2014-10-10: 1000 mg via ORAL

## 2014-10-10 MED ORDER — ACETAMINOPHEN 325 MG PO TABS
650.0000 mg | ORAL_TABLET | ORAL | Status: DC | PRN
  Administered 2014-10-11 (×2): 650 mg via ORAL
  Filled 2014-10-10 (×2): qty 2

## 2014-10-10 MED ORDER — ADULT MULTIVITAMIN W/MINERALS CH
1.0000 | ORAL_TABLET | Freq: Every day | ORAL | Status: DC
  Administered 2014-10-11: 1 via ORAL
  Filled 2014-10-10: qty 1

## 2014-10-10 MED ORDER — NEOSTIGMINE METHYLSULFATE 10 MG/10ML IV SOLN
INTRAVENOUS | Status: DC | PRN
  Administered 2014-10-10: 3 mg via INTRAVENOUS

## 2014-10-10 MED ORDER — BLACK CHERRY CONCENTRATE PO LIQD: 2.0000 | Freq: Every day | ORAL | Status: DC

## 2014-10-10 MED ORDER — GLYCOPYRROLATE 0.2 MG/ML IJ SOLN
INTRAMUSCULAR | Status: DC | PRN
  Administered 2014-10-10: 0.2 mg via INTRAVENOUS
  Administered 2014-10-10: 0.4 mg via INTRAVENOUS

## 2014-10-10 MED ORDER — LIDOCAINE HCL (CARDIAC) 20 MG/ML IV SOLN
INTRAVENOUS | Status: DC | PRN
  Administered 2014-10-10: 100 mg via INTRAVENOUS

## 2014-10-10 MED ORDER — LORATADINE 10 MG PO TABS
10.0000 mg | ORAL_TABLET | Freq: Every day | ORAL | Status: DC
  Administered 2014-10-11: 10 mg via ORAL
  Filled 2014-10-10 (×2): qty 1

## 2014-10-10 MED ORDER — LABETALOL HCL 5 MG/ML IV SOLN
INTRAVENOUS | Status: DC | PRN
  Administered 2014-10-10: 10 mg via INTRAVENOUS

## 2014-10-10 MED ORDER — FENTANYL CITRATE (PF) 100 MCG/2ML IJ SOLN
25.0000 ug | INTRAMUSCULAR | Status: DC | PRN
  Administered 2014-10-10 (×4): 25 ug via INTRAVENOUS

## 2014-10-10 MED ORDER — PANTOPRAZOLE SODIUM 40 MG PO TBEC
40.0000 mg | DELAYED_RELEASE_TABLET | Freq: Every day | ORAL | Status: DC
  Administered 2014-10-11: 40 mg via ORAL
  Filled 2014-10-10: qty 1

## 2014-10-10 MED ORDER — PRIMIDONE 50 MG PO TABS
50.0000 mg | ORAL_TABLET | Freq: Two times a day (BID) | ORAL | Status: DC
  Administered 2014-10-11 (×2): 50 mg via ORAL
  Filled 2014-10-10 (×3): qty 1

## 2014-10-10 MED ORDER — FENTANYL CITRATE (PF) 100 MCG/2ML IJ SOLN
INTRAMUSCULAR | Status: DC | PRN
  Administered 2014-10-10: 100 ug via INTRAVENOUS
  Administered 2014-10-10 (×2): 50 ug via INTRAVENOUS

## 2014-10-10 MED ORDER — EPHEDRINE SULFATE 50 MG/ML IJ SOLN
INTRAMUSCULAR | Status: DC | PRN
  Administered 2014-10-10: 10 mg via INTRAVENOUS

## 2014-10-10 MED ORDER — ONDANSETRON HCL 4 MG/2ML IJ SOLN
4.0000 mg | Freq: Once | INTRAMUSCULAR | Status: AC | PRN
  Administered 2014-10-10: 4 mg via INTRAVENOUS

## 2014-10-10 MED ORDER — ONDANSETRON HCL 4 MG/2ML IJ SOLN: 4.0000 mg | Freq: Four times a day (QID) | INTRAMUSCULAR | Status: DC | PRN

## 2014-10-10 MED ORDER — ONDANSETRON HCL 4 MG/2ML IJ SOLN
INTRAMUSCULAR | Status: DC | PRN
  Administered 2014-10-10: 4 mg via INTRAVENOUS

## 2014-10-10 MED ORDER — DOCUSATE SODIUM 100 MG PO CAPS
100.0000 mg | ORAL_CAPSULE | Freq: Two times a day (BID) | ORAL | Status: DC
  Administered 2014-10-11 (×2): 100 mg via ORAL
  Filled 2014-10-10 (×2): qty 1

## 2014-10-10 MED ORDER — LACTATED RINGERS IV SOLN
INTRAVENOUS | Status: DC
  Administered 2014-10-10: 13:00:00 via INTRAVENOUS
  Administered 2014-10-10: 1000 mL via INTRAVENOUS
  Administered 2014-10-11: 06:00:00 via INTRAVENOUS

## 2014-10-10 MED ORDER — ACETAMINOPHEN 500 MG PO TABS
ORAL_TABLET | ORAL | Status: AC
  Filled 2014-10-10: qty 2

## 2014-10-10 MED ORDER — SODIUM CHLORIDE 0.9 % IV SOLN
INTRAVENOUS | Status: DC | PRN
  Administered 2014-10-10: 10 mL

## 2014-10-10 SURGICAL SUPPLY — 37 items
APPLIER CLIP ROT 10 11.4 M/L (STAPLE) ×3
CANISTER SUCT 1200ML W/VALVE (MISCELLANEOUS) ×3 IMPLANT
CANNULA DILATOR 12 W/SLV (CANNULA) ×2 IMPLANT
CANNULA DILATOR 12MM W/SLV (CANNULA) ×1
CATH REDDICK CHOLANGI 4FR 50CM (CATHETERS) ×3 IMPLANT
CHLORAPREP W/TINT 26ML (MISCELLANEOUS) ×3 IMPLANT
CLIP APPLIE ROT 10 11.4 M/L (STAPLE) ×1 IMPLANT
CLOSURE WOUND 1/2 X4 (GAUZE/BANDAGES/DRESSINGS) ×1
DRAPE SHEET LG 3/4 BI-LAMINATE (DRAPES) ×3 IMPLANT
GAUZE SPONGE 4X4 12PLY STRL (GAUZE/BANDAGES/DRESSINGS) ×3 IMPLANT
GLOVE BIO SURGEON STRL SZ7.5 (GLOVE) ×9 IMPLANT
GOWN STRL REUS W/ TWL LRG LVL3 (GOWN DISPOSABLE) ×3 IMPLANT
GOWN STRL REUS W/TWL LRG LVL3 (GOWN DISPOSABLE) ×6
IRRIGATION STRYKERFLOW (MISCELLANEOUS) ×1 IMPLANT
IRRIGATOR STRYKERFLOW (MISCELLANEOUS) ×3
IV NS 1000ML (IV SOLUTION) ×2
IV NS 1000ML BAXH (IV SOLUTION) ×1 IMPLANT
KIT RM TURNOVER STRD PROC AR (KITS) ×3 IMPLANT
LABEL OR SOLS (LABEL) ×3 IMPLANT
NDL INSUFF ACCESS 14 VERSASTEP (NEEDLE) ×3 IMPLANT
NEEDLE FILTER BLUNT 18X 1/2SAF (NEEDLE) ×2
NEEDLE FILTER BLUNT 18X1 1/2 (NEEDLE) ×1 IMPLANT
NS IRRIG 500ML POUR BTL (IV SOLUTION) ×3 IMPLANT
PACK LAP CHOLECYSTECTOMY (MISCELLANEOUS) ×3 IMPLANT
PAD GROUND ADULT SPLIT (MISCELLANEOUS) ×3 IMPLANT
POUCH ENDO CATCH 10MM SPEC (MISCELLANEOUS) ×3 IMPLANT
SCISSORS METZENBAUM CVD 33 (INSTRUMENTS) ×3 IMPLANT
SEAL FOR SCOPE WARMER C3101 (MISCELLANEOUS) ×3 IMPLANT
SLEEVE ENDOPATH XCEL 5M (ENDOMECHANICALS) ×3 IMPLANT
STRIP CLOSURE SKIN 1/2X4 (GAUZE/BANDAGES/DRESSINGS) ×2 IMPLANT
SUT CHROMIC 5 0 RB 1 27 (SUTURE) ×3 IMPLANT
SUT VIC AB 0 CT2 27 (SUTURE) ×3 IMPLANT
SYR 3ML LL SCALE MARK (SYRINGE) ×3 IMPLANT
TROCAR XCEL NON-BLD 11X100MML (ENDOMECHANICALS) ×3 IMPLANT
TROCAR XCEL NON-BLD 5MMX100MML (ENDOMECHANICALS) ×3 IMPLANT
TUBING INSUFFLATOR HI FLOW (MISCELLANEOUS) ×3 IMPLANT
WATER STERILE IRR 1000ML POUR (IV SOLUTION) ×3 IMPLANT

## 2014-10-10 NOTE — OR Nursing (Signed)
Report given to Wylene Men RN

## 2014-10-10 NOTE — H&P (Signed)
  He reports no change in condition since the day of the office visit.  I discussed plan for laparoscopic cholecystectomy

## 2014-10-10 NOTE — Progress Notes (Signed)
After surgery he went to the recovery room and later to the outpatient surgery department.  The heart monitor demonstrated bradycardia with heart rate dipping of slow was 29. Blood pressure was stable.  He reports no dizziness and no difficulty breathing and no chest pain.  He is awake alert and oriented.  Lung sounds are clear.  Heart regular rhythm.  EKG with first-degree AV block and sinus bradycardia  I reviewed the anesthesia record.  He did have hypotension with induction of anesthesia and received ephedrine subsequent blood pressure 1 up to 200. He was given labetalol and blood pressure returned to normal.  I reviewed that he had cardiac catheterization recently which showed some minor luminal irregularities but no significant coronary disease.  Impression 1st degree AV block with sinus bradycardia  I discussed this with Dr. Doy Hutching in internal medicine who will arrange for internal medicine consultation and overnight observation  I anticipate he can take a regular diet and take Tylenol or Percocet if needed for pain

## 2014-10-10 NOTE — Op Note (Signed)
Dr Tressia Miners in to see pt

## 2014-10-10 NOTE — Anesthesia Postprocedure Evaluation (Signed)
  Anesthesia Post-op Note  Patient: Allen Chang  Procedure(s) Performed: Procedure(s): LAPAROSCOPIC CHOLECYSTECTOMY WITH INTRAOPERATIVE CHOLANGIOGRAM (N/A)  Anesthesia type:General  Patient location: PACU  Post pain: Pain level controlled  Post assessment: Post-op Vital signs reviewed, Patient's Cardiovascular Status Stable, Respiratory Function Stable, Patent Airway and No signs of Nausea or vomiting  Post vital signs: Reviewed and stable  Last Vitals:  Filed Vitals:   10/10/14 1645  BP: 125/72  Pulse: 62  Temp:   Resp: 18    Level of consciousness: awake, alert  and patient cooperative  Complications: No apparent anesthesia complications

## 2014-10-10 NOTE — H&P (Signed)
Allen Chang at [facility] NAME: Allen Chang    MR#:  [id number]  DATE OF BIRTH:  1932/06/25  DATE OF ADMISSION:  10/10/2014  PRIMARY CARE PHYSICIAN: Tracie Harrier, MD   REQUESTING/REFERRING PHYSICIAN: Dr. [name]  CHIEF COMPLAINT:  No chief complaint on file. Bradycardia  HISTORY OF PRESENT ILLNESS:  Allen Chang  is a 81 y.o. male with a known history of HTN, GERD, CKD stage 3 at baseline had an elective cholecystectomy done for cholelithiasis and cholecystitis. Post op noted to be bradycardic in 30's and as low as 25 and required medical admission. Pre op cardiac cath done in April 2016- mild luminal irregularities but no significant CAD. Not on beta blockers or calcium channel blockers as outpatient. During surgery, received fentanyl, propofol and 1 dose of labetelol for elevated blood pressure. Asymptomatic now, EKG with sinus brady only- first degree heart block., PR interval of 235ms. Strong radial pulse on exam, but slow Will admit for observation and check labs for electrolytes.  PAST MEDICAL HISTORY:   Past Medical History  Diagnosis Date  . Hypertension   . GERD (gastroesophageal reflux disease)   . Elevated lipids   . Cancer     prostate  . Complication of anesthesia   . Tremors of nervous system   . Renal disease     PAST SURGICAL HISTORY:   Past Surgical History  Procedure Laterality Date  . Cardiac catheterization    . Hemorrhoid surgery    . Hernia repair Left     x2  . Bladder tumor excision    . Shoulder arthroscopy Right   . Prostate seeding      SOCIAL HISTORY:   Social History  Substance Use Topics  . Smoking status: Never Smoker   . Smokeless tobacco: Never Used  . Alcohol Use: No    FAMILY HISTORY:   Family History  Problem Relation Age of Onset  . Bone cancer Father   . Hypertension Mother   . Osteoporosis Mother     DRUG ALLERGIES:   Allergies  Allergen Reactions  .  Percocet [Oxycodone-Acetaminophen] Other (See Comments)    Hallucination   . Voltaren [Diclofenac Sodium] Palpitations    REVIEW OF SYSTEMS:   Review of Systems  Constitutional: Negative for fever, chills, weight loss and malaise/fatigue.  HENT: Negative for ear discharge, ear pain, hearing loss, nosebleeds and tinnitus.   Eyes: Negative for blurred vision, double vision and photophobia.  Respiratory: Negative for cough, hemoptysis, shortness of breath and wheezing.   Cardiovascular: Negative for chest pain, palpitations, orthopnea and leg swelling.  Gastrointestinal: Positive for nausea and abdominal pain. Negative for heartburn, vomiting, diarrhea, constipation and melena.  Genitourinary: Negative for dysuria, urgency, frequency and hematuria.  Musculoskeletal: Negative for myalgias, back pain and neck pain.  Skin: Negative for rash.  Neurological: Negative for dizziness, tingling, tremors, sensory change, speech change, focal weakness and headaches.  Endo/Heme/Allergies: Does not bruise/bleed easily.  Psychiatric/Behavioral: Negative for depression.    MEDICATIONS AT HOME:   Prior to Admission medications   Medication Sig Start Date End Date Taking? Authorizing Provider  Flaxseed, Linseed, (FLAX SEED OIL) 1000 MG CAPS Take 1 capsule by mouth daily.   Yes Historical Provider, MD  Garlic 0962 MG CAPS Take 1 capsule by mouth every morning.   Yes Historical Provider, MD  glucosamine-chondroitin 500-400 MG tablet Take 1 tablet by mouth 2 (two) times daily.   Yes Historical Provider, MD  lisinopril (PRINIVIL,ZESTRIL) 20  MG tablet Take 10 mg by mouth daily.    Yes Historical Provider, MD  loratadine (CLARITIN REDITABS) 10 MG dissolvable tablet Take 10 mg by mouth daily.   Yes Historical Provider, MD  Misc Natural Products (BLACK CHERRY CONCENTRATE) LIQD Take 2 Doses by mouth daily.   Yes Historical Provider, MD  Multiple Vitamins-Minerals (MULTIVITAMIN WITH MINERALS) tablet Take 1 tablet  by mouth daily.   Yes Historical Provider, MD  Omega-3 Fatty Acids (FISH OIL) 1000 MG CAPS Take 1 capsule by mouth daily.   Yes Historical Provider, MD  omeprazole (PRILOSEC) 20 MG capsule Take 20 mg by mouth daily.   Yes Historical Provider, MD  primidone (MYSOLINE) 50 MG tablet Take 50 mg by mouth 2 (two) times daily.   Yes Historical Provider, MD  aspirin 81 MG tablet Take 81 mg by mouth daily.    Historical Provider, MD  docusate sodium (COLACE) 100 MG capsule Take 100 mg by mouth 2 (two) times daily.    Historical Provider, MD  oxyCODONE-acetaminophen (ROXICET) 5-325 MG per tablet Take 0.5 tablets by mouth every 4 (four) hours as needed for moderate pain. Patient not taking: Reported on 10/10/2014 09/30/14   Ahmed Prima, MD      VITAL SIGNS:  Blood pressure 141/65, pulse 45, temperature 96.1 F (35.6 C), temperature source Tympanic, resp. rate 16, height 5\' 8"  (1.727 m), weight 78.926 kg (174 lb), SpO2 98 %.  PHYSICAL EXAMINATION:   Physical Exam  GENERAL:  81 y.o.-year-old patient lying in the bed with no acute distress.  EYES: Pupils equal, round, reactive to light and accommodation. No scleral icterus. Extraocular muscles intact.  HEENT: Head atraumatic, normocephalic. Oropharynx and nasopharynx clear.  NECK:  Supple, no jugular venous distention. No thyroid enlargement, no tenderness.  LUNGS: Normal breath sounds bilaterally, no wheezing, rales,rhonchi or crepitation. No use of accessory muscles of respiration.  CARDIOVASCULAR: S1, S2 normal. Slow rate but regular rhythm, strong radial pulse. No murmurs, rubs, or gallops.  ABDOMEN: Tenderness in RUQ at surgical site, abdomen with no bleeding. Dressing in place. Hypoactive bowel sounds present.. No organomegaly or mass.  EXTREMITIES: No pedal edema, cyanosis, or clubbing.  NEUROLOGIC: Cranial nerves II through XII are intact. Muscle strength 5/5 in all extremities. Sensation intact. Gait not checked.  PSYCHIATRIC: The patient  is alert and oriented x 3.  SKIN: No obvious rash, lesion, or ulcer.   LABORATORY PANEL:   CBC No results for input(s): WBC, HGB, HCT, PLT in the last 168 hours. ------------------------------------------------------------------------------------------------------------------  Chemistries  No results for input(s): NA, K, CL, CO2, GLUCOSE, BUN, CREATININE, CALCIUM, MG, AST, ALT, ALKPHOS, BILITOT in the last 168 hours.  Invalid input(s): GFRCGP ------------------------------------------------------------------------------------------------------------------  Cardiac Enzymes No results for input(s): TROPONINI in the last 168 hours. ------------------------------------------------------------------------------------------------------------------  RADIOLOGY:  Dg Cholangiogram Operative  10/10/2014   CLINICAL DATA:  Cholelithiasis.  EXAM: INTRAOPERATIVE CHOLANGIOGRAM  TECHNIQUE: Cholangiographic images from the C-arm fluoroscopic device were submitted for interpretation post-operatively. Please see the procedural report for the amount of contrast and the fluoroscopy time utilized.  COMPARISON:  Ultrasound 09/30/2014  FINDINGS: Contrast opacification of the extrahepatic biliary system. Contrast drains into the duodenum. No large filling defects or stones.  IMPRESSION: Patent biliary system without an obstructing stone or lesion.   Electronically Signed   By: Markus Daft M.D.   On: 10/10/2014 15:48    EKG:   Orders placed or performed during the hospital encounter of 10/10/14  . EKG 12-Lead  . EKG 12-Lead  IMPRESSION AND PLAN:   Demarquis Osley  is a 81 y.o. male with a known history of HTN, GERD, CKD stage 3 at baseline had an elective cholecystectomy done for cholelithiasis and cholecystitis. Post op noted to be bradycardic in 30's and as low as 25 and required medical admission.  #1 Sinus Bradycardia- marked sinus brady - admit under obs to step down unit - pacer pads and prn  atropine - patient is not symptomatic - Cards consult in AM - Check labs for electrolytes - Avoid rate limiting drugs - Recent cardiac cath normal  #2 Post cholecystectomy- s/p lap chole - can be started on regular diet from AM - [facility] for discharge from surgical standpoint per Dr. Tamala Julian when medically cleared - patient cannot take percocet as causes hallucinations- morphine is ok for pain if HR improves - for now, tylenol prn  #3 HTN- on lisinopril  #4 GERD- Protonix  #5 DVT prophylaxis- on lovenox  All the records are reviewed and case discussed with ED provider. Management plans discussed with the patient, family and they are in agreement.  CODE STATUS: FULL CODE  TOTAL TIME TAKING CARE OF THIS PATIENT: 50 minutes.    Gladstone Lighter M.D on 10/10/2014 at 7:29 PM  Between 7am to 6pm - Pager - [phone]  After 6pm go to [email or web address] - password [email or web address]  Office  [phone]  CC: Primary care physician; Tracie Harrier, MD

## 2014-10-10 NOTE — Discharge Instructions (Signed)
Take acetaminophen or acetaminophen with oxycodone as needed for pain. May resume aspirin on Thursday. Remove dressings on Wednesday. May shower Thursday.

## 2014-10-10 NOTE — Progress Notes (Signed)
Bathroom void prior to OR transport

## 2014-10-10 NOTE — Transfer of Care (Signed)
Immediate Anesthesia Transfer of Care Note  Patient: Allen Chang  Procedure(s) Performed: Procedure(s): LAPAROSCOPIC CHOLECYSTECTOMY WITH INTRAOPERATIVE CHOLANGIOGRAM (N/A)  Patient Location: PACU  Anesthesia Type:General  Level of Consciousness: awake, alert , oriented and patient cooperative  Airway & Oxygen Therapy: Patient Spontanous Breathing and Patient connected to nasal cannula oxygen  Post-op Assessment: Report given to RN and Post -op Vital signs reviewed and stable  Post vital signs: Reviewed and stable  Last Vitals:  Filed Vitals:   10/10/14 1629  BP: 172/74  Pulse:   Temp: 36.3 C  Resp: 14    Complications: No apparent anesthesia complications

## 2014-10-10 NOTE — Anesthesia Preprocedure Evaluation (Signed)
Anesthesia Evaluation  Patient identified by MRN, date of birth, ID band Patient awake    Reviewed: Allergy & Precautions, NPO status , Patient's Chart, lab work & pertinent test results  Airway Mallampati: II  TM Distance: >3 FB     Dental  (+) Chipped   Pulmonary neg pulmonary ROS,  breath sounds clear to auscultation  Pulmonary exam normal       Cardiovascular hypertension, Normal cardiovascular exam    Neuro/Psych negative neurological ROS  negative psych ROS   GI/Hepatic GERD-  Medicated and Controlled,  Endo/Other  negative endocrine ROS  Renal/GU Renal InsufficiencyRenal disease  negative genitourinary   Musculoskeletal negative musculoskeletal ROS (+)   Abdominal Normal abdominal exam  (+)   Peds negative pediatric ROS (+)  Hematology negative hematology ROS (+)   Anesthesia Other Findings Patient denies problem with previous  Reproductive/Obstetrics                             Anesthesia Physical Anesthesia Plan  ASA: II  Anesthesia Plan: General   Post-op Pain Management:    Induction: Intravenous  Airway Management Planned: Oral ETT  Additional Equipment:   Intra-op Plan:   Post-operative Plan: Extubation in OR  Informed Consent: I have reviewed the patients History and Physical, chart, labs and discussed the procedure including the risks, benefits and alternatives for the proposed anesthesia with the patient or authorized representative who has indicated his/her understanding and acceptance.   Dental advisory given  Plan Discussed with: CRNA and Surgeon  Anesthesia Plan Comments:         Anesthesia Quick Evaluation

## 2014-10-10 NOTE — Op Note (Signed)
OPERATIVE REPORT  PREOPERATIVE DIAGNOSIS:  Chronic cholecystitis cholelithiasis  POSTOPERATIVE DIAGNOSIS: Chronic cholecystitis cholelithiasis  PROCEDURE: Laparoscopic cholecystectomy with cholangiogram  ANESTHESIA: General  SURGEON: [name] M.D.  INDICATIONS: He is a history of epigastric pain and ultrasound findings of gallstones.   With the patient on the operating table in the supine position under general endotracheal anesthesia the abdomen was prepared with ChloraPrep solution and draped in a sterile manner. A short incision was made in the inferior aspect of the umbilicus and carried down to the deep fascia which was grasped with a laryngeal hook. A Veress needle was inserted aspirated and irrigated with a saline solution. The peritoneal cavity was insufflated with carbon dioxide. The 10 mm 0 laparoscope was inserted to view the peritoneal cavity. Another incision was made in the epigastrium slightly to the right of the midline to introduce an 11 mm cannula. 2 incisions were made in the lateral aspect of the right upper quadrant to introduce 2   5 mm cannulas. Initial inspection revealed that there was a smooth appearance of the liver. There was some moderate gaseous distention in the stomach. I had the anesthetist insert an oral gastric tube which decompressed the stomach.  The gallbladder was retracted towards the right shoulder.  The gallbladder neck was retracted inferiorly and laterally.  The porta hepatis was identified. The gallbladder was mobilized with incision of the visceral peritoneum. The cystic duct was dissected free from surrounding structures. The cystic artery was dissected free from surrounding structures. A critical view of safety was demonstrated the cystic artery was controlled with endoclips and divided which allowed better exposure of the cystic duct. An Endo Clip was placed across the cystic duct adjacent to the gallbladder neck. An incision was made in the  cystic duct to introduce a Reddick catheter. The cholangiogram was done with injection of half-strength Conray 60 dye. This demonstrated the bile ducts and flow of dye into the duodenum. No retained stones were identified. The cholangiogram appeared normal. The Reddick catheter was removed. The cystic duct was doubly ligated with endoclips and divided. The gallbladder was dissected free from the liver with use of hook and cautery and blunt dissection. There was some leakage of bile out of the gallbladder which was aspirated. The site was irrigated with a heparinized saline solution and further aspirated. Bleeding was minimal and hemostasis subsequently was intact. The gallbladder was placed into an Endo Catch bag and brought out through the infraumbilical incision and was sent with palpable stones for routine pathology. The infraumbilical port was reinserted. The right upper quadrant was further inspected and could see hemostasis was intact. The site was irrigated and aspirated with a heparinized saline solution. The laparoscopic instruments and ports were removed. The fascial defect below the umbilicus was closed with a 0 Vicryl suture.The skin incisions were closed with interrupted 5-0 chromic subcutaneous suture benzoin and Steri-Strips. Gauze dressings were applied with paper tape.  The patient appeared to be in satisfactory condition and was prepared for transfer to the recovery room  [place].D.

## 2014-10-10 NOTE — OR Nursing (Signed)
Dr Nicholes Stairs in with pt

## 2014-10-10 NOTE — Anesthesia Procedure Notes (Signed)
Procedure Name: Intubation Date/Time: 10/10/2014 2:44 PM Performed by: Rosaria Ferries, Valgene Deloatch Pre-anesthesia Checklist: Patient identified, Emergency Drugs available, Suction available and Patient being monitored Patient Re-evaluated:Patient Re-evaluated prior to inductionOxygen Delivery Method: Circle system utilized Preoxygenation: Pre-oxygenation with 100% oxygen Intubation Type: IV induction Laryngoscope Size: Mac and 3 Grade View: Grade I Tube size: 7.0 mm Number of attempts: 1 Secured at: 23 cm Tube secured with: Tape Dental Injury: Teeth and Oropharynx as per pre-operative assessment

## 2014-10-10 NOTE — OR Nursing (Signed)
Dr Rosey Bath in to review v/s and ekg

## 2014-10-10 NOTE — OR Nursing (Signed)
Dr Rosey Bath notified of hr in 64s. 12-lead ekg ordered.

## 2014-10-11 ENCOUNTER — Encounter: Payer: Self-pay | Admitting: Surgery

## 2014-10-11 DIAGNOSIS — K8010 Calculus of gallbladder with chronic cholecystitis without obstruction: Secondary | ICD-10-CM | POA: Diagnosis not present

## 2014-10-11 LAB — BASIC METABOLIC PANEL
Anion gap: 5 (ref 5–15)
BUN: 18 mg/dL (ref 6–20)
CO2: 25 mmol/L (ref 22–32)
CREATININE: 1.23 mg/dL (ref 0.61–1.24)
Calcium: 8.5 mg/dL — ABNORMAL LOW (ref 8.9–10.3)
Chloride: 105 mmol/L (ref 101–111)
GFR calc Af Amer: >60 mL/min (ref >60)
GFR, EST NON AFRICAN AMERICAN: 54 mL/min — AB (ref >60)
Glucose, Bld: 124 mg/dL — ABNORMAL HIGH (ref 65–99)
POTASSIUM: 4.5 mmol/L (ref 3.5–5.1)
SODIUM: 135 mmol/L (ref 135–145)

## 2014-10-11 LAB — CBC
HCT: 37.5 % — ABNORMAL LOW (ref 40.0–52.0)
HEMOGLOBIN: 12.6 g/dL — AB (ref 13.0–18.0)
MCH: 33.7 pg (ref 26.0–34.0)
MCHC: 33.7 g/dL (ref 32.0–36.0)
MCV: 99.9 fL (ref 80.0–100.0)
Platelets: 111 10*3/uL — ABNORMAL LOW (ref 150–440)
RBC: 3.75 MIL/uL — ABNORMAL LOW (ref 4.40–5.90)
RDW: 13 % (ref 11.5–14.5)
WBC: 12.5 10*3/uL — ABNORMAL HIGH (ref 3.8–10.6)

## 2014-10-11 NOTE — Discharge Summary (Signed)
Physician Discharge Summary  Allen Chang GGY:[id number] DOB: 1932-04-21 DOA: 10/10/2014  PCP: Tracie Harrier, MD  Admit date: 10/10/2014 Discharge date: 10/11/2014  Time spent: 30 minutes  Recommendations for Outpatient Follow-up:  D/c Home today. Call office to make appt in 1-2 weeks    Discharge Diagnoses:   1  Bradycardia following Cholecystectomy secondary to meds    Discharge Condition: Stable  Diet recommendation: 2 gms sodium  Filed Weights   10/10/14 1152  Weight: 78.926 kg (174 lb)    History of present illness:  Allen Chang is a 81 year old male with a history of Hypertension, GERD stage III CKD who had an elective cholecystectomy done for cholelithiasis and postoperatively was noted to be bradycardic with heart rate as low as 25. Patient had received intravenous labetalol for hypertensive episode earlier and was felt that the bradycardia was most likely secondary to the labetalol .  Hospital Course:  Patient was observed in the intensive care unit with close cardiac monitoring  He was also seen in consultation by cardiologist Dr. Randalyn Rhea Patient had a recent cardiac catheterization with that was unremarkable . He did not have any syncopal episodes . Felt that he did not need any further cardiac workup at this time unless he develops new symptom patient was ambulated and was stable at time of discharge He remained in sinus rhythm.  his heart rate was around 58 at time of discharge . He has been advised follow-up with Dr. Tamala Julian -general surgeon and also cardiologist Dr. fat as an outpatient .   Procedures:  Lap cholecystectomy  Consultations: Dr. Ubaldo Glassing: Cardiologist Dr. Tamala Julian- Surgeon  Discharge Exam: Filed Vitals:   10/11/14 1300  BP: 127/75  Pulse: 57  Temp:   Resp: 15    General: Not in distress Cardiovascular: S1 S2 Respiratory: Clear to auscultation Abdomen: Mild tenderness over incision  Discharge Instructions   Discharge  Instructions    Discharge instructions    Complete by:  As directed   Ambulate as tolerated.          Current Discharge Medication List    CONTINUE these medications which have NOT CHANGED   Details  Flaxseed, Linseed, (FLAX SEED OIL) 1000 MG CAPS Take 1 capsule by mouth daily.    Garlic 0350 MG CAPS Take 1 capsule by mouth every morning.    glucosamine-chondroitin 500-400 MG tablet Take 1 tablet by mouth 2 (two) times daily.    lisinopril (PRINIVIL,ZESTRIL) 20 MG tablet Take 10 mg by mouth daily.     loratadine (CLARITIN REDITABS) 10 MG dissolvable tablet Take 10 mg by mouth daily.    Misc Natural Products (BLACK CHERRY CONCENTRATE) LIQD Take 2 Doses by mouth daily.    Multiple Vitamins-Minerals (MULTIVITAMIN WITH MINERALS) tablet Take 1 tablet by mouth daily.    Omega-3 Fatty Acids (FISH OIL) 1000 MG CAPS Take 1 capsule by mouth daily.    omeprazole (PRILOSEC) 20 MG capsule Take 20 mg by mouth daily.    primidone (MYSOLINE) 50 MG tablet Take 50 mg by mouth 2 (two) times daily.    aspirin 81 MG tablet Take 81 mg by mouth daily.    docusate sodium (COLACE) 100 MG capsule Take 100 mg by mouth 2 (two) times daily.    oxyCODONE-acetaminophen (ROXICET) 5-325 MG per tablet Take 0.5 tablets by mouth every 4 (four) hours as needed for moderate pain. Qty: 12 tablet, Refills: 0       Allergies  Allergen Reactions  . Percocet [Oxycodone-Acetaminophen] Other (See Comments)  Hallucination   . Voltaren [Diclofenac Sodium] Palpitations   Follow-up Information    Follow up with [name], MD In 2 weeks.   Specialty:  Surgery   Contact information:   [address] [place] [id number] [phone]        The results of significant diagnostics from this hospitalization (including imaging, microbiology, ancillary and laboratory) are listed below for reference.    Significant Diagnostic Studies: Dg Chest 2 View  09/29/2014   CLINICAL DATA:  Sudden onset of  epigastric/chest pain, nausea  EXAM: CHEST  2 VIEW  COMPARISON:  05/30/2014  FINDINGS: Lungs are clear.  No pleural effusion or pneumothorax.  The heart is normal in size.  Degenerative changes of the visualized thoracolumbar spine.  IMPRESSION: No evidence of acute cardiopulmonary disease.   Electronically Signed   By: Julian Hy M.D.   On: 09/29/2014 19:26   Dg Cholangiogram Operative  10/10/2014   CLINICAL DATA:  Cholelithiasis.  EXAM: INTRAOPERATIVE CHOLANGIOGRAM  TECHNIQUE: Cholangiographic images from the C-arm fluoroscopic device were submitted for interpretation post-operatively. Please see the procedural report for the amount of contrast and the fluoroscopy time utilized.  COMPARISON:  Ultrasound 09/30/2014  FINDINGS: Contrast opacification of the extrahepatic biliary system. Contrast drains into the duodenum. No large filling defects or stones.  IMPRESSION: Patent biliary system without an obstructing stone or lesion.   Electronically Signed   By: Markus Daft M.D.   On: 10/10/2014 15:48   Mr Shoulder Left Wo Contrast  10/05/2014   CLINICAL DATA:  Left shoulder and upper arm pain for 1 year.  EXAM: MRI OF THE LEFT SHOULDER WITHOUT CONTRAST  TECHNIQUE: Multiplanar, multisequence MR imaging of the shoulder was performed. No intravenous contrast was administered.  COMPARISON:  None.  FINDINGS: Rotator cuff: Severe tendinosis of the supraspinatus tendon with a high-grade partial-thickness articular surface tear. Moderate tendinosis of the infraspinatus tendon without a tear. Teres minor tendon is intact. Partial tear of the subscapularis with severe attenuation of the subscapularis tendon.  Muscles: No atrophy or fatty replacement of nor abnormal signal within, the muscles of the rotator cuff.  Biceps long head: Medial subluxation of the long head of the biceps tendon with moderate tendinosis.  Acromioclavicular Joint: Severe degenerative changes of the acromioclavicular joint. Type II acromion.  Small amount of subacromial/subdeltoid bursal fluid.  Glenohumeral Joint: No significant joint effusion. No chondral defect.  Labrum: Limited evaluation of the labrum secondary to lack of intra-articular fluid. There is a posterior inferior labral tear with a small paralabral cyst.  Bones:  No marrow signal abnormality.  No fracture or dislocation.  IMPRESSION: 1. Severe tendinosis of the supraspinatus tendon with a high-grade partial-thickness articular surface tear. 2. Moderate tendinosis of the infraspinatus tendon without a tear. 3. Partial tear of the subscapularis with severe attenuation of the subscapularis tendon. 4. Medial subluxation of the long head of the biceps tendon with moderate tendinosis. 5. Mild subacromial/subdeltoid bursitis.   Electronically Signed   By: Kathreen Devoid   On: 10/05/2014 14:54   Mr Knee Left  Wo Contrast  10/05/2014   CLINICAL DATA:  Left knee pain.  Osteoarthritis of the left knee.  EXAM: MRI OF THE LEFT KNEE WITHOUT CONTRAST  TECHNIQUE: Multiplanar, multisequence MR imaging of the knee was performed. No intravenous contrast was administered.  COMPARISON:  None.  FINDINGS: MENISCI  Medial meniscus: Attenuation of the posterior horn of the medial meniscus consistent with a tear.  Lateral meniscus:  Intact.  LIGAMENTS  Cruciates:  Intact ACL and PCL.  Collaterals: Mild thickening of the proximal MCL likely secondary to prior injury. MCL is otherwise intact. Lateral collateral ligament complex is intact.  CARTILAGE  Patellofemoral: High-grade partial-thickness cartilage loss of the patellofemoral compartment.  Medial: Full-thickness cartilage loss of the medial femoral condyle and medial tibial plateau with mild subchondral reactive marrow changes and marginal osteophytosis.  Lateral:  Mild chondromalacia of the lateral femoral condyle.  Joint: Moderate joint effusion. Thickened suprapatellar plica. Normal Hoffa's fat.  Popliteal Fossa:  Small Baker cyst.  Intact popliteus tendon.   Extensor Mechanism:  Intact.  Bones: No focal marrow signal abnormality. No fracture or dislocation.  IMPRESSION: 1. Attenuation of the posterior horn of the medial meniscus consistent with a tear. 2. High-grade partial-thickness cartilage loss of the patellofemoral compartment. 3. Full-thickness cartilage loss of the medial femoral condyle and medial tibial plateau with mild subchondral reactive marrow changes and marginal osteophytosis.   Electronically Signed   By: Kathreen Devoid   On: 10/05/2014 15:27   US Abdomen Limited Ruq  09/30/2014   CLINICAL DATA:  Acute onset of epigastric abdominal pain. Initial encounter.  EXAM: US ABDOMEN LIMITED - RIGHT UPPER QUADRANT  COMPARISON:  CT of the abdomen and pelvis performed 05/22/2013, and right upper quadrant ultrasound performed 06/12/2014  FINDINGS: Gallbladder:  Multiple prominent mobile stones are seen, measuring up to 1.6 cm. No gallbladder wall thickening or pericholecystic fluid is seen. No ultrasonographic Murphy's sign is elicited.  Common bile duct:  Diameter: 0.3 cm, within normal limits in caliber.  Liver:  A vague 1.5 x 0.9 x 0.8 cm hypoechoic lesion within the right hepatic lobe is grossly stable from 2015 and likely benign. Within normal limits in parenchymal echogenicity.  IMPRESSION: 1. Cholelithiasis; gallbladder otherwise unremarkable. 2. 1.5 cm hypoechoic lesion within the right hepatic lobe is grossly stable from 2015 and likely benign.   Electronically Signed   By: Garald Balding M.D.   On: 09/30/2014 01:20    Microbiology: Recent Results (from the past 240 hour(s))  MRSA PCR Screening     Status: None   Collection Time: 10/10/14  9:30 PM  Result Value Ref Range Status   MRSA by PCR NEGATIVE NEGATIVE Final    Comment:        The GeneXpert MRSA Assay (FDA approved for NASAL specimens only), is one component of a comprehensive MRSA colonization surveillance program. It is not intended to diagnose MRSA infection nor to guide  or monitor treatment for MRSA infections.      Labs: Basic Metabolic Panel:  Recent Labs Lab 10/10/14 2132 10/11/14 0613  NA 137 135  K 4.7 4.5  CL 104 105  CO2 27 25  GLUCOSE 141* 124*  BUN 18 18  CREATININE 1.29* 1.23  CALCIUM 8.9 8.5*   Liver Function Tests: No results for input(s): AST, ALT, ALKPHOS, BILITOT, PROT, ALBUMIN in the last 168 hours. No results for input(s): LIPASE, AMYLASE in the last 168 hours. No results for input(s): AMMONIA in the last 168 hours. CBC:  Recent Labs Lab 10/10/14 2132 10/11/14 0613  WBC 13.8* 12.5*  HGB 12.8* 12.6*  HCT 39.0* 37.5*  MCV 101.2* 99.9  PLT 116* 111*   Cardiac Enzymes: No results for input(s): CKTOTAL, CKMB, CKMBINDEX, TROPONINI in the last 168 hours. BNP: BNP (last 3 results) No results for input(s): BNP in the last 8760 hours.  ProBNP (last 3 results) No results for input(s): PROBNP in the last 8760 hours.  CBG: No  results for input(s): GLUCAP in the last 168 hours.     SignedTracie Harrier   10/11/2014, 1:23 PM

## 2014-10-11 NOTE — Progress Notes (Signed)
RN notified surgeon, Dr. [name], about patient's right shoulder pain post surgery (patient also has a history of surgery in that shoulder). Patient requested ice and RN applied. MD stated that could be positional from surgery but also could be pain from surgery (diaphragm can be irritated and refer pain to shoulder, if this case MD told RN ice might not help but regular pain medication should). No other orders. MD did okay patient for discharge and informed RN that follow up appointment already made with MD and patient already has his pain medication at home for post surgery.

## 2014-10-11 NOTE — Consult Note (Signed)
[facility]  CARDIOLOGY CONSULT NOTE  Patient ID: Allen Chang MRN: [id number] DOB/AGE: 1932/12/16 81 y.o.  Admit date: 10/10/2014 Referring Physician Hande Primary Physician [facility] Primary Cardiologist   Reason for Consultation   Bradycardia perioperatively HPI:  Patient is an 81 year old male with a history of insignificant coronary disease by cardiac catheterization several years ago.   he underwent cholecystectomy for acute cholecystitis.  He was noted to be bradycardic during induction of anesthesia.  His heart rate was in the 25-35 range.  He did well with the remainder of his surgery.  He has had improved heart rate postop.  He is on no rate-related medications.  His heart rate currently is 65-75.  Denies rapid or irregular heartbeat syncope or presyncope.  He denies chest pain.  Denies previously noting slow heart rate.  ROS Review of Systems - History obtained from the patient General ROS: positive for  - Abdominal pain Respiratory ROS: no cough, shortness of breath, or wheezing Cardiovascular ROS: no chest pain or dyspnea on exertion Gastrointestinal ROS: positive for - abdominal pain Neurological ROS: no TIA or stroke symptoms   Past Medical History  Diagnosis Date  . Hypertension   . GERD (gastroesophageal reflux disease)   . Elevated lipids   . Cancer     prostate  . Complication of anesthesia   . Tremors of nervous system   . Renal disease     CKD stage 3    Family History  Problem Relation Age of Onset  . Bone cancer Father   . Hypertension Mother   . Osteoporosis Mother     Social History   Social History  . Marital Status: Married    Spouse Name: N/A  . Number of Children: N/A  . Years of Education: N/A   Occupational History  . Not on file.   Social History Main Topics  . Smoking status: Never Smoker   . Smokeless tobacco: Never Used  . Alcohol Use: No  . Drug Use: No  . Sexual Activity: Yes   Other  Topics Concern  . Not on file   Social History Narrative    Past Surgical History  Procedure Laterality Date  . Cardiac catheterization    . Hemorrhoid surgery    . Hernia repair Left     x2  . Bladder tumor excision    . Shoulder arthroscopy Right   . Prostate seeding    . Cholecystectomy N/A 10/10/2014    Procedure: LAPAROSCOPIC CHOLECYSTECTOMY WITH INTRAOPERATIVE CHOLANGIOGRAM;  Surgeon: Leonie Green, MD;  Location: [facility] ORS;  Service: General;  Laterality: N/A;     Prescriptions prior to admission  Medication Sig Dispense Refill Last Dose  . Flaxseed, Linseed, (FLAX SEED OIL) 1000 MG CAPS Take 1 capsule by mouth daily.   6/60/6301 at am  . Garlic 6010 MG CAPS Take 1 capsule by mouth every morning.   10/09/2014 at am  . glucosamine-chondroitin 500-400 MG tablet Take 1 tablet by mouth 2 (two) times daily.   10/09/2014 at am  . lisinopril (PRINIVIL,ZESTRIL) 20 MG tablet Take 10 mg by mouth daily.    10/09/2014 at 0800  . loratadine (CLARITIN REDITABS) 10 MG dissolvable tablet Take 10 mg by mouth daily.   10/09/2014 at am  . Misc Natural Products (BLACK CHERRY CONCENTRATE) LIQD Take 2 Doses by mouth daily.   10/09/2014 at am  . Multiple Vitamins-Minerals (MULTIVITAMIN WITH MINERALS) tablet Take 1 tablet by mouth daily.  10/09/2014 at am  . Omega-3 Fatty Acids (FISH OIL) 1000 MG CAPS Take 1 capsule by mouth daily.   10/09/2014 at am  . omeprazole (PRILOSEC) 20 MG capsule Take 20 mg by mouth daily.   10/10/2014 at 0800  . primidone (MYSOLINE) 50 MG tablet Take 50 mg by mouth 2 (two) times daily.   10/10/2014 at 0800  . aspirin 81 MG tablet Take 81 mg by mouth daily.   "it's been 3 or 4 days"  . docusate sodium (COLACE) 100 MG capsule Take 100 mg by mouth 2 (two) times daily.   Not Taking at "it's been a while since I took that"  . oxyCODONE-acetaminophen (ROXICET) 5-325 MG per tablet Take 0.5 tablets by mouth every 4 (four) hours as needed for moderate pain. (Patient not taking: Reported  on 10/10/2014) 12 tablet 0 Not Taking at Unknown time    Physical Exam: Blood pressure 106/56, pulse 57, temperature 98.4 F (36.9 C), temperature source Oral, resp. rate 18, height 5\' 8"  (1.727 m), weight 78.926 kg (174 lb), SpO2 95 %.    General appearance: alert and cooperative Resp: clear to auscultation bilaterally Cardio: regular rate and rhythm, S1, S2 normal, no murmur, click, rub or gallop GI: abnormal findings:  mild tenderness in the upper abdomen Extremities: extremities normal, atraumatic, no cyanosis or edema Pulses: 2+ and symmetric Neurologic: Grossly normal Labs:   Lab Results  Component Value Date   WBC 12.5* 10/11/2014   HGB 12.6* 10/11/2014   HCT 37.5* 10/11/2014   MCV 99.9 10/11/2014   PLT 111* 10/11/2014     Recent Labs Lab 10/11/14 0613  NA 135  K 4.5  CL 105  CO2 25  BUN 18  CREATININE 1.23  CALCIUM 8.5*  GLUCOSE 124*   Lab Results  Component Value Date   TROPONINI <0.03 09/29/2014      EKG:  Initially sinus bradycardia currently sinus rhythm with no arrhythmias or ischemia  ASSESSMENT AND PLAN:   81 year old with no prior cardiac history with unremarkable cardiac catheterization recently admitted with acute cholecystitis.  Noted be bradycardic during induction of anesthesia for cholecystectomy.  He has had no syncopal episodes prior to or since the surgery.  His heart rate currently is 60-70 in sinus rhythm.  Episode was likely secondary to anesthesia induction.  Would not proceed with any further cardiac workup in hospital.  Okay for discharge from cardiac standpoint.  Should follow-up with his primary care provider and if desired with Cardiology for further evaluation to a consider a 48 hour Holter monitor as an outpatient.  Would avoid rate-related medications also. Signed: Teodoro Spray MD, [facility] 10/11/2014, 9:10 AM

## 2014-10-11 NOTE — Progress Notes (Signed)
Pt received from PACU. Marland Kitchen A xox 3. No complaints of CP or SOB.  S brady on CM. Sao2 97%. Lung sound clear. LR is in progress.  Resting iin bed with out any distress.

## 2014-10-11 NOTE — Progress Notes (Signed)
Patient ID: Allen Chang, male   DOB: 1932-07-28, 81 y.o.   MRN: [id number] He reports minimal discomfort at his navel.  He does feel hungry.  No nausea.  Vital signs are stable.  Abdomen soft with no significant tenderness.  Dressings are dry.  Impression satisfactory progress following laparoscopic cholecystectomy with findings of chronic cholecystitis cholelithiasis.  Postoperative bradycardia is improved.  Can likely go home today.  He has an appointment for 2 weeks from now with me.  Patient will remove dressings later today  [name]  MD

## 2014-10-11 NOTE — Progress Notes (Signed)
PROGRESS NOTE  Allen Chang:[id number] DOB: 23-Jul-1932 DOA: 10/10/2014 PCP: Tracie Harrier, MD  Subjective  81 y/o male with hx of HTN, GERD, CKD admitted after he was noted to be bradycardic following Cholecystectomy. Pt had received a dose of labetelol during surgery for elevated BP. This am in SR- h.rate 19 Feels well Consultants:  Dr. Tamala Julian- Surgery   Objective: BP 106/56 mmHg  Pulse 57  Temp(Src) 98.4 F (36.9 C) (Oral)  Resp 18  Ht 5\' 8"  (1.727 m)  Wt 78.926 kg (174 lb)  BMI 26.46 kg/m2  SpO2 95%  Intake/Output Summary (Last 24 hours) at 10/11/14 0827 Last data filed at 10/11/14 0737  Gross per 24 hour  Intake   2000 ml  Output    775 ml  Net   1225 ml   Filed Weights   10/10/14 1152  Weight: 78.926 kg (174 lb)    Exam:  General: Well developed, well nourished male, in NAD HEENT: PERRL; OP moist without lesions. Neck: supple, trachea midline, no thyromegaly Chest: normal to palpation Lungs: clear bilaterally without retractions or wheezes Cardiovascular: RRR, no murmur, no gallop; distal pulses 2+ Abdomen: soft, nontender, nondistended, positive bowel sounds Rt UQ- mild tenderness at incision site  Extremities: no clubbing, cyanosis, edema Neuro: alert and oriented, moves all extremities Derm: no significant rashes or nodules; good skin turgor Lymph: no cervical or supraclavicular lymphadenopathy   Labs and imaging studies were reviewed*  Data Reviewed: Basic Metabolic Panel:  Recent Labs Lab 10/10/14 2132 10/11/14 0613  NA 137 135  K 4.7 4.5  CL 104 105  CO2 27 25  GLUCOSE 141* 124*  BUN 18 18  CREATININE 1.29* 1.23  CALCIUM 8.9 8.5*   Liver Function Tests: No results for input(s): AST, ALT, ALKPHOS, BILITOT, PROT, ALBUMIN in the last 168 hours. No results for input(s): LIPASE, AMYLASE in the last 168 hours. No results for input(s): AMMONIA in the last 168 hours. CBC:  Recent Labs Lab 10/10/14 2132 10/11/14 0613  WBC  13.8* 12.5*  HGB 12.8* 12.6*  HCT 39.0* 37.5*  MCV 101.2* 99.9  PLT 116* 111*   Cardiac Enzymes:   No results for input(s): CKTOTAL, CKMB, CKMBINDEX, TROPONINI in the last 168 hours. BNP (last 3 results) No results for input(s): BNP in the last 8760 hours.  ProBNP (last 3 results) No results for input(s): PROBNP in the last 8760 hours.  CBG: No results for input(s): GLUCAP in the last 168 hours.  Recent Results (from the past 240 hour(s))  MRSA PCR Screening     Status: None   Collection Time: 10/10/14  9:30 PM  Result Value Ref Range Status   MRSA by PCR NEGATIVE NEGATIVE Final    Comment:        The GeneXpert MRSA Assay (FDA approved for NASAL specimens only), is one component of a comprehensive MRSA colonization surveillance program. It is not intended to diagnose MRSA infection nor to guide or monitor treatment for MRSA infections.      Studies: Dg Cholangiogram Operative  10/10/2014   CLINICAL DATA:  Cholelithiasis.  EXAM: INTRAOPERATIVE CHOLANGIOGRAM  TECHNIQUE: Cholangiographic images from the C-arm fluoroscopic device were submitted for interpretation post-operatively. Please see the procedural report for the amount of contrast and the fluoroscopy time utilized.  COMPARISON:  Ultrasound 09/30/2014  FINDINGS: Contrast opacification of the extrahepatic biliary system. Contrast drains into the duodenum. No large filling defects or stones.  IMPRESSION: Patent biliary system without an obstructing stone or lesion.  Electronically Signed   By: Markus Daft M.D.   On: 10/10/2014 15:48    Scheduled Meds: . docusate sodium  100 mg Oral BID  . enoxaparin (LOVENOX) injection  40 mg Subcutaneous Q24H  . lisinopril  10 mg Oral Daily  . loratadine  10 mg Oral Daily  . multivitamin with minerals  1 tablet Oral Daily  . omega-3 acid ethyl esters  1 g Oral Daily  . pantoprazole  40 mg Oral Daily  . primidone  50 mg Oral BID    Continuous Infusions: . lactated ringers 50  mL/hr at 10/11/14 0559    Assessment/Plan: 1 Sinus Bradycardia most likely secondary to Labetolol Currently in SR . H rate of 57 2 S/p Lab Chole- Doing well 3 HTN: On Lisinopril 4 GERD; On protonix. Ambulate today. If h.rate remains stable- may be able to go home later today   Code Status: Full  Family Communication: Wife        10/11/2014, 8:27 AM  LOS: 1 day

## 2014-10-11 NOTE — Progress Notes (Signed)
Discharge instructions reviewed with patient. Questions answered. Patient to follow up per MD instructions.

## 2014-10-12 LAB — SURGICAL PATHOLOGY

## 2014-12-06 ENCOUNTER — Encounter
Admission: RE | Admit: 2014-12-06 | Discharge: 2014-12-06 | Disposition: A | Discharge status: Home / Self Care | Payer: Medicare Other | Source: Ambulatory Visit | Attending: Surgery | Admitting: Surgery

## 2014-12-06 DIAGNOSIS — M179 Osteoarthritis of knee, unspecified: Secondary | ICD-10-CM | POA: Insufficient documentation

## 2014-12-06 DIAGNOSIS — Z01818 Encounter for other preprocedural examination: Secondary | ICD-10-CM | POA: Diagnosis not present

## 2014-12-06 HISTORY — DX: Diverticulosis of intestine, part unspecified, without perforation or abscess without bleeding: K57.90

## 2014-12-06 LAB — SURGICAL PCR SCREEN
MRSA, PCR: NEGATIVE
STAPHYLOCOCCUS AUREUS: NEGATIVE

## 2014-12-06 LAB — URINALYSIS COMPLETE WITH MICROSCOPIC (ARMC ONLY)
Bacteria, UA: NONE SEEN
Bilirubin Urine: NEGATIVE
GLUCOSE, UA: NEGATIVE mg/dL
HGB URINE DIPSTICK: NEGATIVE
Ketones, ur: NEGATIVE mg/dL
Leukocytes, UA: NEGATIVE
Nitrite: NEGATIVE
Protein, ur: NEGATIVE mg/dL
Specific Gravity, Urine: 1.024 (ref 1.005–1.030)
pH: 5 (ref 5.0–8.0)

## 2014-12-06 LAB — BASIC METABOLIC PANEL
ANION GAP: 8 (ref 5–15)
BUN: 25 mg/dL — ABNORMAL HIGH (ref 6–20)
CALCIUM: 9.6 mg/dL (ref 8.9–10.3)
CO2: 31 mmol/L (ref 22–32)
Chloride: 102 mmol/L (ref 101–111)
Creatinine, Ser: 1.24 mg/dL (ref 0.61–1.24)
GFR, EST NON AFRICAN AMERICAN: 53 mL/min — AB (ref >60)
Glucose, Bld: 71 mg/dL (ref 65–99)
POTASSIUM: 3.9 mmol/L (ref 3.5–5.1)
Sodium: 141 mmol/L (ref 135–145)

## 2014-12-06 LAB — CBC
HCT: 41.7 % (ref 40.0–52.0)
Hemoglobin: 13.8 g/dL (ref 13.0–18.0)
MCH: 33.5 pg (ref 26.0–34.0)
MCHC: 33.2 g/dL (ref 32.0–36.0)
MCV: 101.1 fL — ABNORMAL HIGH (ref 80.0–100.0)
PLATELETS: 136 10*3/uL — AB (ref 150–440)
RBC: 4.13 MIL/uL — AB (ref 4.40–5.90)
RDW: 13.5 % (ref 11.5–14.5)
WBC: 7.3 10*3/uL (ref 3.8–10.6)

## 2014-12-06 LAB — ABO/RH: ABO/RH(D): A POS

## 2014-12-06 LAB — PROTIME-INR
INR: 0.98
PROTHROMBIN TIME: 13.2 s (ref 11.4–15.0)

## 2014-12-06 LAB — TYPE AND SCREEN
ABO/RH(D): A POS
ANTIBODY SCREEN: NEGATIVE

## 2014-12-06 LAB — APTT: APTT: 29 s (ref 24–36)

## 2014-12-06 LAB — SEDIMENTATION RATE: SED RATE: 7 mm/h (ref 0–20)

## 2014-12-06 NOTE — Patient Instructions (Signed)
  Your procedure is scheduled on: Tuesday December 12, 2014. Report to Same Day Surgery. To find out your arrival time please call [phone] between 1PM - 3PM on Monday December 11, 2014.  Remember: Instructions that are not followed completely may result in serious medical risk, up to and including death, or upon the discretion of your surgeon and anesthesiologist your surgery may need to be rescheduled.    _x___ 1. Do not eat food or drink liquids after midnight. No gum chewing or hard candies.     ____ 2. No Alcohol for 24 hours before or after surgery.   ____ 3. Bring all medications with you on the day of surgery if instructed.    _x___ 4. Notify your doctor if there is any change in your medical condition     (cold, fever, infections).     Do not wear jewelry, make-up, hairpins, clips or nail polish.  Do not wear lotions, powders, or perfumes. You may wear deodorant.  Do not shave 48 hours prior to surgery. Men may shave face and neck.  Do not bring valuables to the hospital.    [facility] is not responsible for any belongings or valuables.               Contacts, dentures or bridgework may not be worn into surgery.  Leave your suitcase in the car. After surgery it may be brought to your room.  For patients admitted to the hospital, discharge time is determined by your treatment team.   Patients discharged the day of surgery will not be allowed to drive home.    Please read over the following fact sheets that you were given:   [facility] Preparing for Surgery  _x___ Take these medicines the morning of surgery with A SIP OF WATER:    1. lisinopril (PRINIVIL,ZESTRIL)   2. omeprazole (PRILOSEC)    ____ Fleet Enema (as directed)   _x___ Use CHG Soap as directed  ____ Use inhalers on the day of surgery  ____ Stop metformin 2 days prior to surgery    ____ Take 1/2 of usual insulin dose the night before surgery and none on the morning of surgery.   _x___ Stopped  aspirin 11/19/2014.  _x__ Stop Anti-inflammatories Aleve.  Can take Tylenol for pain.   _x___ Stop supplements until after surgery.  Flaxseed, garlic,glucosamine-chondroitin, fish oil.  ____ Bring C-Pap to the hospital.

## 2014-12-08 LAB — URINE CULTURE: SPECIAL REQUESTS: NORMAL

## 2014-12-12 ENCOUNTER — Inpatient Hospital Stay: Payer: Medicare Other | Admitting: *Deleted

## 2014-12-12 ENCOUNTER — Encounter
Admission: RE | Disposition: A | Discharge status: Home / Self Care | Payer: Self-pay | Source: Ambulatory Visit | Attending: Surgery

## 2014-12-12 ENCOUNTER — Encounter: Payer: Self-pay | Admitting: *Deleted

## 2014-12-12 ENCOUNTER — Inpatient Hospital Stay: Payer: Medicare Other

## 2014-12-12 ENCOUNTER — Inpatient Hospital Stay
Admission: RE | Admit: 2014-12-12 | Discharge: 2014-12-14 | DRG: 470 | Disposition: A | Discharge status: Home / Self Care | Payer: Medicare Other | Source: Ambulatory Visit | Attending: Surgery | Admitting: Surgery

## 2014-12-12 DIAGNOSIS — Z8546 Personal history of malignant neoplasm of prostate: Secondary | ICD-10-CM | POA: Diagnosis not present

## 2014-12-12 DIAGNOSIS — K219 Gastro-esophageal reflux disease without esophagitis: Secondary | ICD-10-CM | POA: Diagnosis present

## 2014-12-12 DIAGNOSIS — I10 Essential (primary) hypertension: Secondary | ICD-10-CM | POA: Diagnosis present

## 2014-12-12 DIAGNOSIS — M179 Osteoarthritis of knee, unspecified: Secondary | ICD-10-CM | POA: Diagnosis present

## 2014-12-12 DIAGNOSIS — Z96652 Presence of left artificial knee joint: Secondary | ICD-10-CM

## 2014-12-12 HISTORY — DX: Cardiac arrhythmia, unspecified: I49.9

## 2014-12-12 HISTORY — PX: PARTIAL KNEE ARTHROPLASTY: SHX2174

## 2014-12-12 SURGERY — ARTHROPLASTY, KNEE, UNICOMPARTMENTAL
Anesthesia: Spinal | Site: Knee | Laterality: Left | Wound class: Clean

## 2014-12-12 MED ORDER — CEFAZOLIN SODIUM-DEXTROSE 2-3 GM-% IV SOLR
INTRAVENOUS | Status: AC
  Filled 2014-12-12: qty 50

## 2014-12-12 MED ORDER — PROPOFOL 500 MG/50ML IV EMUL
INTRAVENOUS | Status: DC | PRN
  Administered 2014-12-12: 25 ug/kg/min via INTRAVENOUS

## 2014-12-12 MED ORDER — GLUCOSAMINE-CHONDROITIN 500-400 MG PO TABS: 1.0000 | ORAL_TABLET | Freq: Two times a day (BID) | ORAL | Status: DC

## 2014-12-12 MED ORDER — OXYCODONE HCL 5 MG PO TABS
5.0000 mg | ORAL_TABLET | ORAL | Status: DC | PRN
  Administered 2014-12-13 (×2): 5 mg via ORAL
  Administered 2014-12-13: 10 mg via ORAL
  Administered 2014-12-13 – 2014-12-14 (×2): 5 mg via ORAL
  Filled 2014-12-12 (×3): qty 1
  Filled 2014-12-12: qty 2
  Filled 2014-12-12: qty 1

## 2014-12-12 MED ORDER — DIPHENHYDRAMINE HCL 12.5 MG/5ML PO ELIX: 12.5000 mg | ORAL_SOLUTION | ORAL | Status: DC | PRN

## 2014-12-12 MED ORDER — FENTANYL CITRATE (PF) 100 MCG/2ML IJ SOLN: 25.0000 ug | INTRAMUSCULAR | Status: DC | PRN

## 2014-12-12 MED ORDER — BLACK CHERRY CONCENTRATE PO LIQD: 2.0000 | Freq: Every day | ORAL | Status: DC

## 2014-12-12 MED ORDER — ACETAMINOPHEN 500 MG PO TABS
1000.0000 mg | ORAL_TABLET | Freq: Four times a day (QID) | ORAL | Status: AC
  Administered 2014-12-12 – 2014-12-13 (×4): 1000 mg via ORAL
  Filled 2014-12-12 (×4): qty 2

## 2014-12-12 MED ORDER — OMEGA-3-ACID ETHYL ESTERS 1 G PO CAPS
1.0000 g | ORAL_CAPSULE | Freq: Every day | ORAL | Status: DC
  Administered 2014-12-13 – 2014-12-14 (×2): 1 g via ORAL
  Filled 2014-12-12 (×2): qty 1

## 2014-12-12 MED ORDER — ADULT MULTIVITAMIN W/MINERALS CH
1.0000 | ORAL_TABLET | Freq: Every day | ORAL | Status: DC
  Administered 2014-12-13 – 2014-12-14 (×2): 1 via ORAL
  Filled 2014-12-12 (×2): qty 1

## 2014-12-12 MED ORDER — ACETAMINOPHEN 650 MG RE SUPP: 650.0000 mg | Freq: Four times a day (QID) | RECTAL | Status: DC | PRN

## 2014-12-12 MED ORDER — DOCUSATE SODIUM 100 MG PO CAPS
100.0000 mg | ORAL_CAPSULE | Freq: Two times a day (BID) | ORAL | Status: DC
  Administered 2014-12-12 – 2014-12-14 (×4): 100 mg via ORAL
  Filled 2014-12-12 (×4): qty 1

## 2014-12-12 MED ORDER — LORATADINE 10 MG PO TBDP: 10.0000 mg | ORAL_TABLET | Freq: Every day | ORAL | Status: DC

## 2014-12-12 MED ORDER — NEOMYCIN-POLYMYXIN B GU 40-200000 IR SOLN
Status: DC | PRN
  Administered 2014-12-12: 16 mL

## 2014-12-12 MED ORDER — ACETAMINOPHEN 325 MG PO TABS: 650.0000 mg | ORAL_TABLET | Freq: Four times a day (QID) | ORAL | Status: DC | PRN

## 2014-12-12 MED ORDER — ONDANSETRON HCL 4 MG PO TABS: 4.0000 mg | ORAL_TABLET | Freq: Four times a day (QID) | ORAL | Status: DC | PRN

## 2014-12-12 MED ORDER — ONDANSETRON HCL 4 MG/2ML IJ SOLN: 4.0000 mg | Freq: Once | INTRAMUSCULAR | Status: DC | PRN

## 2014-12-12 MED ORDER — GARLIC 1000 MG PO CAPS: 1.0000 | ORAL_CAPSULE | Freq: Every morning | ORAL | Status: DC

## 2014-12-12 MED ORDER — ASPIRIN 81 MG PO TABS: 81.0000 mg | ORAL_TABLET | Freq: Every day | ORAL | Status: DC

## 2014-12-12 MED ORDER — NEOMYCIN-POLYMYXIN B GU 40-200000 IR SOLN
Status: AC
  Filled 2014-12-12: qty 20

## 2014-12-12 MED ORDER — BUPIVACAINE LIPOSOME 1.3 % IJ SUSP
INTRAMUSCULAR | Status: AC
  Filled 2014-12-12: qty 20

## 2014-12-12 MED ORDER — METOCLOPRAMIDE HCL 10 MG PO TABS
5.0000 mg | ORAL_TABLET | Freq: Three times a day (TID) | ORAL | Status: DC | PRN
Start: 2014-12-12 — End: 2014-12-14

## 2014-12-12 MED ORDER — LISINOPRIL 10 MG PO TABS
10.0000 mg | ORAL_TABLET | Freq: Every day | ORAL | Status: DC
  Administered 2014-12-12 – 2014-12-14 (×3): 10 mg via ORAL
  Filled 2014-12-12 (×3): qty 1

## 2014-12-12 MED ORDER — LACTATED RINGERS IV SOLN
INTRAVENOUS | Status: DC
  Administered 2014-12-12 (×2): via INTRAVENOUS

## 2014-12-12 MED ORDER — TRAMADOL HCL 50 MG PO TABS
50.0000 mg | ORAL_TABLET | Freq: Four times a day (QID) | ORAL | Status: DC | PRN
  Administered 2014-12-13: 50 mg via ORAL
  Filled 2014-12-12: qty 2

## 2014-12-12 MED ORDER — BUPIVACAINE-EPINEPHRINE (PF) 0.5% -1:200000 IJ SOLN
INTRAMUSCULAR | Status: AC
  Filled 2014-12-12: qty 30

## 2014-12-12 MED ORDER — ASPIRIN 81 MG PO CHEW
81.0000 mg | CHEWABLE_TABLET | Freq: Every day | ORAL | Status: DC
  Administered 2014-12-13 – 2014-12-14 (×2): 81 mg via ORAL
  Filled 2014-12-12 (×2): qty 1

## 2014-12-12 MED ORDER — PANTOPRAZOLE SODIUM 40 MG PO TBEC
40.0000 mg | DELAYED_RELEASE_TABLET | Freq: Every day | ORAL | Status: DC
  Administered 2014-12-13 – 2014-12-14 (×2): 40 mg via ORAL
  Filled 2014-12-12 (×2): qty 1

## 2014-12-12 MED ORDER — ONDANSETRON HCL 4 MG/2ML IJ SOLN: 4.0000 mg | Freq: Four times a day (QID) | INTRAMUSCULAR | Status: DC | PRN

## 2014-12-12 MED ORDER — FLEET ENEMA 7-19 GM/118ML RE ENEM: 1.0000 | ENEMA | Freq: Once | RECTAL | Status: DC | PRN

## 2014-12-12 MED ORDER — HYDROMORPHONE HCL 1 MG/ML IJ SOLN
0.5000 mg | INTRAMUSCULAR | Status: DC | PRN
  Administered 2014-12-12 – 2014-12-13 (×4): 0.5 mg via INTRAVENOUS
  Filled 2014-12-12 (×3): qty 1

## 2014-12-12 MED ORDER — MAGNESIUM HYDROXIDE 400 MG/5ML PO SUSP
30.0000 mL | Freq: Every day | ORAL | Status: DC | PRN
Start: 2014-12-12 — End: 2014-12-14

## 2014-12-12 MED ORDER — PRIMIDONE 50 MG PO TABS
50.0000 mg | ORAL_TABLET | Freq: Two times a day (BID) | ORAL | Status: DC
  Administered 2014-12-12 – 2014-12-14 (×5): 50 mg via ORAL
  Filled 2014-12-12 (×5): qty 1

## 2014-12-12 MED ORDER — TRANEXAMIC ACID 1000 MG/10ML IV SOLN
INTRAVENOUS | Status: AC
  Filled 2014-12-12: qty 10

## 2014-12-12 MED ORDER — CEFAZOLIN SODIUM-DEXTROSE 2-3 GM-% IV SOLR
2.0000 g | Freq: Three times a day (TID) | INTRAVENOUS | Status: AC
  Administered 2014-12-12 – 2014-12-13 (×3): 2 g via INTRAVENOUS
  Filled 2014-12-12 (×3): qty 50

## 2014-12-12 MED ORDER — SODIUM CHLORIDE 0.9 % IJ SOLN
INTRAMUSCULAR | Status: AC
  Filled 2014-12-12: qty 50

## 2014-12-12 MED ORDER — ENOXAPARIN SODIUM 40 MG/0.4ML ~~LOC~~ SOLN
40.0000 mg | SUBCUTANEOUS | Status: DC
  Administered 2014-12-13 – 2014-12-14 (×2): 40 mg via SUBCUTANEOUS
  Filled 2014-12-12 (×2): qty 0.4

## 2014-12-12 MED ORDER — KCL IN DEXTROSE-NACL 20-5-0.9 MEQ/L-%-% IV SOLN
INTRAVENOUS | Status: DC
Start: 2014-12-12 — End: 2014-12-14
  Administered 2014-12-12 – 2014-12-13 (×2): via INTRAVENOUS
  Filled 2014-12-12 (×5): qty 1000

## 2014-12-12 MED ORDER — LORATADINE 10 MG PO TABS
10.0000 mg | ORAL_TABLET | Freq: Every day | ORAL | Status: DC
  Administered 2014-12-13 – 2014-12-14 (×2): 10 mg via ORAL
  Filled 2014-12-12 (×2): qty 1

## 2014-12-12 MED ORDER — BUPIVACAINE-EPINEPHRINE (PF) 0.5% -1:200000 IJ SOLN
INTRAMUSCULAR | Status: DC | PRN
  Administered 2014-12-12: 30 mL via PERINEURAL

## 2014-12-12 MED ORDER — METOCLOPRAMIDE HCL 5 MG/ML IJ SOLN: 5.0000 mg | Freq: Three times a day (TID) | INTRAMUSCULAR | Status: DC | PRN

## 2014-12-12 MED ORDER — CEFAZOLIN SODIUM-DEXTROSE 2-3 GM-% IV SOLR
2.0000 g | Freq: Once | INTRAVENOUS | Status: AC
  Administered 2014-12-12: 2 g via INTRAVENOUS

## 2014-12-12 MED ORDER — EPHEDRINE SULFATE 50 MG/ML IJ SOLN
INTRAMUSCULAR | Status: DC | PRN
  Administered 2014-12-12 (×4): 10 mg via INTRAVENOUS

## 2014-12-12 MED ORDER — SODIUM CHLORIDE 0.9 % IV SOLN
INTRAVENOUS | Status: DC | PRN
  Administered 2014-12-12: 60 mL

## 2014-12-12 MED ORDER — FLAX SEED OIL 1000 MG PO CAPS: 1.0000 | ORAL_CAPSULE | Freq: Every day | ORAL | Status: DC

## 2014-12-12 MED ORDER — BISACODYL 10 MG RE SUPP
10.0000 mg | Freq: Every day | RECTAL | Status: DC | PRN
Start: 2014-12-12 — End: 2014-12-14

## 2014-12-12 MED ORDER — TRANEXAMIC ACID 1000 MG/10ML IV SOLN
1000.0000 mg | INTRAVENOUS | Status: DC | PRN
  Administered 2014-12-12: 1000 mg via TOPICAL

## 2014-12-12 SURGICAL SUPPLY — 75 items
BANDAGE ELASTIC 6 CLIP ST LF (GAUZE/BANDAGES/DRESSINGS) ×3 IMPLANT
BLADE BOVIE TIP EXT 4 (BLADE) ×3 IMPLANT
BLADE SURG SZ10 CARB STEEL (BLADE) ×12 IMPLANT
BNDG COHESIVE 4X5 TAN STRL (GAUZE/BANDAGES/DRESSINGS) ×3 IMPLANT
BNDG COHESIVE 6X5 TAN STRL LF (GAUZE/BANDAGES/DRESSINGS) ×3 IMPLANT
BNDG ESMARK 6X12 TAN STRL LF (GAUZE/BANDAGES/DRESSINGS) ×3 IMPLANT
BONE CEMENT PALACOSE (Orthopedic Implant) ×3 IMPLANT
BOWL CEMENT MIX W SPATULA BONE (MISCELLANEOUS) ×3 IMPLANT
BOWL CEMENT MIX W/ADAPTER (MISCELLANEOUS) ×3 IMPLANT
CANISTER SUCT 1200ML W/VALVE (MISCELLANEOUS) ×3 IMPLANT
CANISTER SUCT 3000ML (MISCELLANEOUS) ×3 IMPLANT
CAPT KNEE PARTIAL 2 ×3 IMPLANT
CATH FOL LEG HOLDER (MISCELLANEOUS) ×3 IMPLANT
CATH TRAY METER 16FR LF (MISCELLANEOUS) ×3 IMPLANT
CEMENT BONE PALACOSE (Orthopedic Implant) ×1 IMPLANT
CHLORAPREP W/TINT 26ML (MISCELLANEOUS) ×3 IMPLANT
CNTNR SPEC C3OZ STD GRAD LEK (MISCELLANEOUS) ×1 IMPLANT
CONT SPEC 3OZ W/LID STRL (MISCELLANEOUS) ×2
COOLER POLAR GLACIER W/PUMP (MISCELLANEOUS) ×3 IMPLANT
COVER MAYO STAND STRL (DRAPES) ×3 IMPLANT
DRAPE C-ARM XRAY 36X54 (DRAPES) ×3 IMPLANT
DRAPE INCISE IOBAN 66X45 STRL (DRAPES) ×3 IMPLANT
DRAPE POUCH INSTRU U-SHP 10X18 (DRAPES) ×3 IMPLANT
DRAPE TABLE BACK 80X90 (DRAPES) ×3 IMPLANT
DRSG OPSITE POSTOP 4X12 (GAUZE/BANDAGES/DRESSINGS) IMPLANT
DRSG OPSITE POSTOP 4X14 (GAUZE/BANDAGES/DRESSINGS) IMPLANT
DRSG OPSITE POSTOP 4X6 (GAUZE/BANDAGES/DRESSINGS) IMPLANT
ELECT CAUTERY BLADE 6.4 (BLADE) ×3 IMPLANT
GAUZE PETRO XEROFOAM 1X8 (MISCELLANEOUS) ×3 IMPLANT
GAUZE SPONGE 4X4 12PLY STRL (GAUZE/BANDAGES/DRESSINGS) ×3 IMPLANT
GLOVE BIO SURGEON STRL SZ7.5 (GLOVE) ×6 IMPLANT
GLOVE BIO SURGEON STRL SZ8 (GLOVE) ×6 IMPLANT
GLOVE BIOGEL PI IND STRL 8 (GLOVE) ×1 IMPLANT
GLOVE BIOGEL PI INDICATOR 8 (GLOVE) ×2
GLOVE INDICATOR 8.0 STRL GRN (GLOVE) ×3 IMPLANT
GOWN STRL REUS W/ TWL LRG LVL3 (GOWN DISPOSABLE) ×2 IMPLANT
GOWN STRL REUS W/ TWL XL LVL3 (GOWN DISPOSABLE) ×1 IMPLANT
GOWN STRL REUS W/TWL LRG LVL3 (GOWN DISPOSABLE) ×4
GOWN STRL REUS W/TWL XL LVL3 (GOWN DISPOSABLE) ×2
GRADUATE 1200CC STRL 31836 (MISCELLANEOUS) ×3 IMPLANT
HANDLE YANKAUER SUCT BULB TIP (MISCELLANEOUS) ×3 IMPLANT
HANDPIECE SUCTION TUBG SURGILV (MISCELLANEOUS) ×3 IMPLANT
HOOD PEEL AWAY FACE SHEILD DIS (HOOD) ×9 IMPLANT
MAT BLUE FLOOR 46X72 FLO (MISCELLANEOUS) ×3 IMPLANT
NDL SAFETY 18GX1.5 (NEEDLE) ×3 IMPLANT
NEEDLE SPNL 18GX3.5 QUINCKE PK (NEEDLE) ×3 IMPLANT
NEEDLE SPNL 20GX3.5 QUINCKE YW (NEEDLE) ×3 IMPLANT
NS IRRIG 1000ML POUR BTL (IV SOLUTION) ×3 IMPLANT
PACK ARTHROSCOPY KNEE (MISCELLANEOUS) ×3 IMPLANT
PACK BLADE SAW RECIP 70 3 PT (BLADE) ×3 IMPLANT
PAD GROUND ADULT SPLIT (MISCELLANEOUS) ×3 IMPLANT
PAD WRAPON POLAR KNEE (MISCELLANEOUS) ×1 IMPLANT
PADDING CAST 4IN STRL (MISCELLANEOUS) ×4
PADDING CAST BLEND 4X4 STRL (MISCELLANEOUS) ×2 IMPLANT
PENCIL ELECTRO HAND CTR (MISCELLANEOUS) ×3 IMPLANT
SOL .9 NS 3000ML IRR  AL (IV SOLUTION) ×2
SOL .9 NS 3000ML IRR UROMATIC (IV SOLUTION) ×1 IMPLANT
SPONGE LAP 18X18 5 PK (GAUZE/BANDAGES/DRESSINGS) IMPLANT
SPONGE XRAY 4X4 16PLY STRL (MISCELLANEOUS) IMPLANT
STAPLER SKIN PROX 35W (STAPLE) ×3 IMPLANT
STRAP SAFETY BODY (MISCELLANEOUS) ×3 IMPLANT
SUCTION FRAZIER TIP 10 FR DISP (SUCTIONS) ×3 IMPLANT
SUT VIC AB 0 CT1 36 (SUTURE) ×12 IMPLANT
SUT VIC AB 2-0 CT1 27 (SUTURE) ×8
SUT VIC AB 2-0 CT1 TAPERPNT 27 (SUTURE) ×4 IMPLANT
SUT VIC AB 2-0 CT2 27 (SUTURE) ×6 IMPLANT
SYR 20CC LL (SYRINGE) ×3 IMPLANT
SYR 30ML LL (SYRINGE) ×6 IMPLANT
SYR BULB IRRIG 60ML STRL (SYRINGE) ×3 IMPLANT
SYRINGE 10CC LL (SYRINGE) ×3 IMPLANT
TAPE TRANSPORE STRL 2 31045 (GAUZE/BANDAGES/DRESSINGS) ×3 IMPLANT
TUBING CONNECTING 10 (TUBING) ×2 IMPLANT
TUBING CONNECTING 10' (TUBING) ×1
WATER STERILE IRR 1000ML POUR (IV SOLUTION) IMPLANT
WRAPON POLAR PAD KNEE (MISCELLANEOUS) ×3

## 2014-12-12 NOTE — Anesthesia Preprocedure Evaluation (Addendum)
Anesthesia Evaluation  Patient identified by MRN, date of birth, ID band Patient awake    Reviewed: Allergy & Precautions, NPO status , Patient's Chart, lab work & pertinent test results  History of Anesthesia Complications (+) POST - OP SPINAL HEADACHE and history of anesthetic complications  Airway Mallampati: III  TM Distance: >3 FB Neck ROM: Limited    Dental  (+) Chipped   Pulmonary neg pulmonary ROS,    Pulmonary exam normal breath sounds clear to auscultation       Cardiovascular hypertension, Pt. on medications Normal cardiovascular exam+ dysrhythmias Atrial Fibrillation      Neuro/Psych negative neurological ROS  negative psych ROS   GI/Hepatic Neg liver ROS, GERD  Medicated and Controlled,  Endo/Other  negative endocrine ROS  Renal/GU negative Renal ROS  negative genitourinary   Musculoskeletal negative musculoskeletal ROS (+)   Abdominal Normal abdominal exam  (+)   Peds negative pediatric ROS (+)  Hematology negative hematology ROS (+)   Anesthesia Other Findings   Reproductive/Obstetrics                            Anesthesia Physical Anesthesia Plan  ASA: III  Anesthesia Plan: Spinal   Post-op Pain Management:    Induction: Intravenous  Airway Management Planned: Nasal Cannula  Additional Equipment:   Intra-op Plan:   Post-operative Plan:   Informed Consent: I have reviewed the patients History and Physical, chart, labs and discussed the procedure including the risks, benefits and alternatives for the proposed anesthesia with the patient or authorized representative who has indicated his/her understanding and acceptance.   Dental advisory given  Plan Discussed with: CRNA and Surgeon  Anesthesia Plan Comments: (Appears patient had a spinal headache about 45-50 years ago, but patient reassured that we use smaller needles now and the chances are less.  Patient  prefers spinal and is agreeable to procedure)        Anesthesia Quick Evaluation

## 2014-12-12 NOTE — Progress Notes (Signed)
PT Cancellation Note  Patient Details Name: Allen Chang MRN: [id number] DOB: 1932-07-16   Cancelled Treatment:    Reason Eval/Treat Not Completed: Other (comment) (See PT note for further details) PT attempted consult, however pt currently with no sensation surrounding proximal and distal LLE. Pt currently not appropriate for PT consult; will attempt at later time/date when pt has full strength and sensation.    Janyth Contes 12/12/2014, 3:32 PM Janyth Contes, SPT. [phone]

## 2014-12-12 NOTE — NC FL2 (Signed)
[place] LEVEL OF CARE SCREENING TOOL     IDENTIFICATION  Patient Name: ISIAC BREIGHNER Birthdate: 05-24-1932 Sex: male Admission Date (Current Location): 12/12/2014  [facility] and [place] Number:  [facility] )  Facility and Address:      Provider Number: [id number]   Attending Physician Name and Address:  Corky Mull, MD  Relative Name and Address:       Current Level of Care: Hospital Recommended Level of Care: Nursing Facility Prior Approval Number:    Date Approved/Denied:   PASRR Number:    Discharge Plan: SNF    Current Diagnoses: Patient Active Problem List   Diagnosis Date Noted  . Status post left partial knee replacement 12/12/2014  . Bradycardia 10/10/2014  . Severe sinus bradycardia 10/10/2014    DISORIENTED AMBULATORY STATUS BLADDER BOWEL  Constantly Semi-Ambulatory Indwelling catheter Continent  INAPPROPRIATE BEHAVIOR FUNCTIONAL LIMITATIONS COMMUNICATION OF NEEDS RESPIRATION   (N/A)  (N/A) Verbally Normal (Room Air )  PERSONAL CARE ASSISTANCE ACTIVITIES/SOCIAL SKIN NUTRITION STATUS  Bathing, Dressing (Extensive Assist ) Active Other (Comment) (Incision: Left Knee) Diet (Diet: Heart Healthy )  PHYSICIAN VISITS NEUROLOGICAL    30 days  (N/A)     SPECIAL CARE FACTORS FREQUENCY  PT (By licensed PT) (PT and OT )     PT Frequency:  (5)             Current Medications (12/12/2014): Current Facility-Administered Medications  Medication Dose Route Frequency Provider Last Rate Last Dose  . [START ON 12/13/2014] acetaminophen (TYLENOL) tablet 650 mg  650 mg Oral Q6H PRN Corky Mull, MD       Or  . Derrill Memo ON 12/13/2014] acetaminophen (TYLENOL) suppository 650 mg  650 mg Rectal Q6H PRN Corky Mull, MD      . acetaminophen (TYLENOL) tablet 1,000 mg  1,000 mg Oral 4 times per day Corky Mull, MD   1,000 mg at 12/12/14 1414  . aspirin chewable tablet 81 mg  81 mg Oral Daily Corky Mull, MD   81 mg at 12/12/14 1221  . bisacodyl  (DULCOLAX) suppository 10 mg  10 mg Rectal Daily PRN Corky Mull, MD      . ceFAZolin (ANCEF) 2-3 GM-% IVPB SOLR           . ceFAZolin (ANCEF) IVPB 2 g/50 mL premix  2 g Intravenous 3 times per day Corky Mull, MD      . dextrose 5 % and 0.9 % NaCl with KCl 20 mEq/L infusion   Intravenous Continuous Corky Mull, MD 75 mL/hr at 12/12/14 1237    . diphenhydrAMINE (BENADRYL) 12.5 MG/5ML elixir 12.5-25 mg  12.5-25 mg Oral Q4H PRN Corky Mull, MD      . docusate sodium (COLACE) capsule 100 mg  100 mg Oral BID Corky Mull, MD   100 mg at 12/12/14 1221  . [START ON 12/13/2014] enoxaparin (LOVENOX) injection 40 mg  40 mg Subcutaneous Q24H Corky Mull, MD      . HYDROmorphone (DILAUDID) injection 0.5-1 mg  0.5-1 mg Intravenous Q2H PRN Corky Mull, MD   0.5 mg at 12/12/14 1243  . lisinopril (PRINIVIL,ZESTRIL) tablet 10 mg  10 mg Oral Daily Corky Mull, MD   10 mg at 12/12/14 1415  . loratadine (CLARITIN) tablet 10 mg  10 mg Oral Daily Corky Mull, MD   10 mg at 12/12/14 1222  . magnesium hydroxide (MILK OF MAGNESIA) suspension 30 mL  30 mL Oral Daily PRN Corky Mull, MD      . metoCLOPramide (REGLAN) tablet 5-10 mg  5-10 mg Oral Q8H PRN Corky Mull, MD       Or  . metoCLOPramide (REGLAN) injection 5-10 mg  5-10 mg Intravenous Q8H PRN Corky Mull, MD      . multivitamin with minerals tablet 1 tablet  1 tablet Oral Daily Corky Mull, MD   1 tablet at 12/12/14 1223  . omega-3 acid ethyl esters (LOVAZA) capsule 1 g  1 g Oral Daily Corky Mull, MD   1 g at 12/12/14 1223  . ondansetron (ZOFRAN) tablet 4 mg  4 mg Oral Q6H PRN Corky Mull, MD       Or  . ondansetron [facility]) injection 4 mg  4 mg Intravenous Q6H PRN Corky Mull, MD      . oxyCODONE (Oxy IR/ROXICODONE) immediate release tablet 5-10 mg  5-10 mg Oral Q4H PRN Corky Mull, MD      . pantoprazole (PROTONIX) EC tablet 40 mg  40 mg Oral Daily Corky Mull, MD   40 mg at 12/12/14 1223  . primidone (MYSOLINE) tablet 50 mg  50 mg Oral BID  Corky Mull, MD   50 mg at 12/12/14 1415  . sodium phosphate (FLEET) 7-19 GM/118ML enema 1 enema  1 enema Rectal Once PRN Corky Mull, MD      . traMADol Veatrice Bourbon) tablet 50-100 mg  50-100 mg Oral Q6H PRN Corky Mull, MD       Do not use this list as official medication orders. Please verify with discharge summary.  Discharge Medications:   Medication List    ASK your doctor about these medications        aspirin 81 MG tablet  Take 81 mg by mouth daily. In am     Black Cherry Concentrate Liqd  Take 2 Doses by mouth daily.     docusate sodium 100 MG capsule  Commonly known as:  COLACE  Take 200 mg by mouth daily.     Fish Oil 1000 MG Caps  Take 1 capsule by mouth daily.     Flax Seed Oil 1000 MG Caps  Take 1 capsule by mouth daily.     Garlic 8185 MG Caps  Take 1 capsule by mouth every morning.     glucosamine-chondroitin 500-400 MG tablet  Take 1 tablet by mouth 2 (two) times daily. Glucosamine 1500 mg and Chondrotoin 1200 mg     lisinopril 20 MG tablet  Commonly known as:  PRINIVIL,ZESTRIL  Take 10 mg by mouth daily. In am     loratadine 10 MG dissolvable tablet  Commonly known as:  CLARITIN REDITABS  Take 10 mg by mouth daily. In am     multivitamin with minerals tablet  Take 1 tablet by mouth daily. In am     omeprazole 20 MG capsule  Commonly known as:  PRILOSEC  Take 20 mg by mouth 2 (two) times daily before a meal.     oxyCODONE-acetaminophen 5-325 MG tablet  Commonly known as:  ROXICET  Take 0.5 tablets by mouth every 4 (four) hours as needed for moderate pain.     primidone 50 MG tablet  Commonly known as:  MYSOLINE  Take 50 mg by mouth 2 (two) times daily.        Relevant Imaging Results:  Relevant Lab Results:  Recent Labs    Additional Information  (  Full Code. Allergies: Percocet Oxycodone-acetaminophen, Voltaren Diclofenac Sodium)  Loralyn Freshwater, LCSW

## 2014-12-12 NOTE — H&P (Signed)
Paper H&P to be scanned into permanent record. H&P reviewed. No changes. 

## 2014-12-12 NOTE — Progress Notes (Signed)
Pt admitted from PACU. Clear liquids tolerated, diet advanced. Tolerates CPM machine to L knee. No further c/o pain, IV Dilaudid given x 1 for breakthrough pain. Sensation, pulses intact to LLE. Family at bedside. No DME needs at this time, plans to discharge to home w/ spouse, date unknown.

## 2014-12-12 NOTE — NC FL2 (Cosign Needed)
[place] LEVEL OF CARE SCREENING TOOL     IDENTIFICATION  Patient Name: Allen Chang Birthdate: 1932-07-10 Sex: male Admission Date (Current Location): 12/12/2014  [facility] and [place] Number:  [facility] )  Facility and Address:      Provider Number: [id number]   Attending Physician Name and Address:  Corky Mull, MD  Relative Name and Address:       Current Level of Care: Hospital Recommended Level of Care: Nursing Facility Prior Approval Number:    Date Approved/Denied:   PASRR Number:    Discharge Plan: SNF    Current Diagnoses: Patient Active Problem List   Diagnosis Date Noted  . Status post left partial knee replacement 12/12/2014  . Bradycardia 10/10/2014  . Severe sinus bradycardia 10/10/2014    DISORIENTED AMBULATORY STATUS BLADDER BOWEL  Constantly Semi-Ambulatory Indwelling catheter Continent  INAPPROPRIATE BEHAVIOR FUNCTIONAL LIMITATIONS COMMUNICATION OF NEEDS RESPIRATION   (N/A)  (N/A) Verbally Normal (Room Air )  PERSONAL CARE ASSISTANCE ACTIVITIES/SOCIAL SKIN NUTRITION STATUS  Bathing, Dressing (Extensive Assist ) Active Other (Comment) (Incision: Left Knee) Diet (Diet: Heart Healthy )  PHYSICIAN VISITS NEUROLOGICAL    30 days  (N/A)     SPECIAL CARE FACTORS FREQUENCY  PT (By licensed PT) (PT and OT )     PT Frequency:  (5)             Current Medications (12/12/2014): Current Facility-Administered Medications  Medication Dose Route Frequency Provider Last Rate Last Dose  . [START ON 12/13/2014] acetaminophen (TYLENOL) tablet 650 mg  650 mg Oral Q6H PRN Corky Mull, MD       Or  . Derrill Memo ON 12/13/2014] acetaminophen (TYLENOL) suppository 650 mg  650 mg Rectal Q6H PRN Corky Mull, MD      . acetaminophen (TYLENOL) tablet 1,000 mg  1,000 mg Oral 4 times per day Corky Mull, MD   1,000 mg at 12/12/14 1414  . aspirin chewable tablet 81 mg  81 mg Oral Daily Corky Mull, MD   81 mg at 12/12/14 1221  . bisacodyl  (DULCOLAX) suppository 10 mg  10 mg Rectal Daily PRN Corky Mull, MD      . ceFAZolin (ANCEF) 2-3 GM-% IVPB SOLR           . ceFAZolin (ANCEF) IVPB 2 g/50 mL premix  2 g Intravenous 3 times per day Corky Mull, MD      . dextrose 5 % and 0.9 % NaCl with KCl 20 mEq/L infusion   Intravenous Continuous Corky Mull, MD 75 mL/hr at 12/12/14 1237    . diphenhydrAMINE (BENADRYL) 12.5 MG/5ML elixir 12.5-25 mg  12.5-25 mg Oral Q4H PRN Corky Mull, MD      . docusate sodium (COLACE) capsule 100 mg  100 mg Oral BID Corky Mull, MD   100 mg at 12/12/14 1221  . [START ON 12/13/2014] enoxaparin (LOVENOX) injection 40 mg  40 mg Subcutaneous Q24H Corky Mull, MD      . HYDROmorphone (DILAUDID) injection 0.5-1 mg  0.5-1 mg Intravenous Q2H PRN Corky Mull, MD   0.5 mg at 12/12/14 1243  . lisinopril (PRINIVIL,ZESTRIL) tablet 10 mg  10 mg Oral Daily Corky Mull, MD   10 mg at 12/12/14 1415  . loratadine (CLARITIN) tablet 10 mg  10 mg Oral Daily Corky Mull, MD   10 mg at 12/12/14 1222  . magnesium hydroxide (MILK OF MAGNESIA) suspension 30 mL  30 mL Oral Daily PRN Corky Mull, MD      . metoCLOPramide (REGLAN) tablet 5-10 mg  5-10 mg Oral Q8H PRN Corky Mull, MD       Or  . metoCLOPramide (REGLAN) injection 5-10 mg  5-10 mg Intravenous Q8H PRN Corky Mull, MD      . multivitamin with minerals tablet 1 tablet  1 tablet Oral Daily Corky Mull, MD   1 tablet at 12/12/14 1223  . omega-3 acid ethyl esters (LOVAZA) capsule 1 g  1 g Oral Daily Corky Mull, MD   1 g at 12/12/14 1223  . ondansetron (ZOFRAN) tablet 4 mg  4 mg Oral Q6H PRN Corky Mull, MD       Or  . ondansetron [facility]) injection 4 mg  4 mg Intravenous Q6H PRN Corky Mull, MD      . oxyCODONE (Oxy IR/ROXICODONE) immediate release tablet 5-10 mg  5-10 mg Oral Q4H PRN Corky Mull, MD      . pantoprazole (PROTONIX) EC tablet 40 mg  40 mg Oral Daily Corky Mull, MD   40 mg at 12/12/14 1223  . primidone (MYSOLINE) tablet 50 mg  50 mg Oral BID  Corky Mull, MD   50 mg at 12/12/14 1415  . sodium phosphate (FLEET) 7-19 GM/118ML enema 1 enema  1 enema Rectal Once PRN Corky Mull, MD      . traMADol Veatrice Bourbon) tablet 50-100 mg  50-100 mg Oral Q6H PRN Corky Mull, MD       Do not use this list as official medication orders. Please verify with discharge summary.  Discharge Medications:   Medication List    ASK your doctor about these medications        aspirin 81 MG tablet  Take 81 mg by mouth daily. In am     Black Cherry Concentrate Liqd  Take 2 Doses by mouth daily.     docusate sodium 100 MG capsule  Commonly known as:  COLACE  Take 200 mg by mouth daily.     Fish Oil 1000 MG Caps  Take 1 capsule by mouth daily.     Flax Seed Oil 1000 MG Caps  Take 1 capsule by mouth daily.     Garlic 8502 MG Caps  Take 1 capsule by mouth every morning.     glucosamine-chondroitin 500-400 MG tablet  Take 1 tablet by mouth 2 (two) times daily. Glucosamine 1500 mg and Chondrotoin 1200 mg     lisinopril 20 MG tablet  Commonly known as:  PRINIVIL,ZESTRIL  Take 10 mg by mouth daily. In am     loratadine 10 MG dissolvable tablet  Commonly known as:  CLARITIN REDITABS  Take 10 mg by mouth daily. In am     multivitamin with minerals tablet  Take 1 tablet by mouth daily. In am     omeprazole 20 MG capsule  Commonly known as:  PRILOSEC  Take 20 mg by mouth 2 (two) times daily before a meal.     oxyCODONE-acetaminophen 5-325 MG tablet  Commonly known as:  ROXICET  Take 0.5 tablets by mouth every 4 (four) hours as needed for moderate pain.     primidone 50 MG tablet  Commonly known as:  MYSOLINE  Take 50 mg by mouth 2 (two) times daily.        Relevant Imaging Results:  Relevant Lab Results:  Recent Labs    Additional Information  (  Full Code. Allergies: Percocet Oxycodone-acetaminophen, Voltaren Diclofenac Sodium)  Loralyn Freshwater, LCSW

## 2014-12-12 NOTE — Anesthesia Procedure Notes (Signed)
Spinal Patient location during procedure: OR Start time: 12/12/2014 7:35 AM End time: 12/12/2014 7:39 AM Staffing Anesthesiologist: Alvin Critchley Performed by: anesthesiologist  Preanesthetic Checklist Completed: patient identified, site marked, surgical consent, pre-op evaluation, timeout performed, IV checked, risks and benefits discussed and monitors and equipment checked Spinal Block Patient position: sitting Prep: Betadine and site prepped and draped Patient monitoring: heart rate, cardiac monitor, continuous pulse ox and blood pressure Approach: right paramedian Location: L3-4 Injection technique: single-shot Needle Needle type: [place]  Needle gauge: 25 G Needle length: 9 cm Assessment Sensory level: T8 Additional Notes Time out called.  Patient placed in sitting position and prepped and draped in sterile fashion.   Easy spinal as above.  No blood or paresthesias.  Clear, colorless CSF in all 4 quadrants.   Patient tolerated the procedure well.  Skin wheal made with 1% Lidocaine plain  Easy X 1 attempt.

## 2014-12-12 NOTE — Transfer of Care (Signed)
Immediate Anesthesia Transfer of Care Note  Patient: Allen Chang  Procedure(s) Performed: Procedure(s): UNICOMPARTMENTAL KNEE (Left)  Patient Location: PACU  Anesthesia Type:MAC and Spinal  Level of Consciousness: awake, alert  and oriented  Airway & Oxygen Therapy: Patient Spontanous Breathing and Patient connected to nasal cannula oxygen  Post-op Assessment: Report given to RN and Post -op Vital signs reviewed and stable  Post vital signs: Reviewed and stable  Last Vitals:  Filed Vitals:   12/12/14 1012  BP: 112/65  Pulse: 62  Temp: 36.2 C  Resp: 22    Complications: No apparent anesthesia complications

## 2014-12-12 NOTE — Op Note (Signed)
12/12/2014  10:05 AM  Patient:   Allen Chang  Pre-Op Diagnosis:   Osteoarthritis of medial compartment, left knee.  Post-Op Diagnosis:   Same.  Procedure:   Left unicondylar knee arthroplasty.  Surgeon:   Pascal Lux, MD  Assistant:   Cameron Proud, PA-C  Anesthesia:   Spinal  Findings:   As above.  Complications:   None  EBL:   10 cc  Fluids:   600 cc crystalloid  UOP:   125 cc  TT:   100 minutes at 300 mmHg  Drains:   None  Closure:   Staples  Implants:   All-cemented Biomet Oxford system with a Small femoral component, an "A" sized tibial tray, and a 4 mm meniscal bearing insert.  Brief Clinical Note:   The patient is an 81 year old male with a several year history of progressively worsening medial sided left knee pain. His symptoms have worsened despite medications, activity modification, injections, etc. His history and examination consistent with degenerative joint disease, primarily involving the medial compartment, as confirmed by MRI scan. The patient presents at this time for a left partial knee replacement.  Procedure:   The patient was brought into the operating room and a spinal placed by the anesthesiologist. The patient was lain in the supine position and a Foley catheter inserted. The patient was repositioned so that the non-surgical leg was placed in a flexed and abducted position in the yellow fin leg holder while the surgical site was placed over the Biomet leg holder. The left lower extremity was prepped with ChloraPrep solution before being draped sterilely. Preoperative antibiotics were administered. After performing a timeout to verify the appropriate surgical site, the limb was exsanguinated with an Esmarch and the tourniquet inflated to 300 mmHg. A standard anterior approach to the knee was made through an approximately 3.5-4 inch incision. The incision was carried down through the subcutaneous tissues to expose the superficial retinaculum. This  was split the length the incision and medial flap elevated sufficiently to expose the medial retinaculum. This was incised along the medial border of the patella tendon and extended proximally along the medial border of the patella, leaving a 3-4 mm cuff of tissue. The soft tissues were elevated off the anteromedial aspect of the proximal tibia. The anterior portion of the meniscus was removed after performing a subtotal excision of the infrapatellar fat pad. The anterior cruciate ligament was inspected and found to be in excellent condition. Osteophytes were removed from the notch using a quarter-inch osteotome. There were significant degenerative changes of both the femur and tibia on the medial side. The medial femoral condyle was sized using the small and medium sizers. It was felt that the small guide best optimized the contour of the femur. This was left in place and the external tibial guide positioned. The coupling device was used to connect the guide to the medial femoral condylar sizer to optimize appropriate orientation. Two guide pins were inserted into the cutting block before the coupling device and sizer were removed. The appropriate tibial cut was made using the oscillating and reciprocating saws. The piece was removed in its entirety and taken to the back table where it was sized and found to be optimally replicated by an "A" sized component. The 8 mm spacer was inserted to verify that sufficient bone had been removed.  Attention was directed to femoral side. The intramedullary canal was accessed through a 4 mm drill hole. The intramedullary guide was positioned before the guide  for the femoral condylar holes was positioned. The appropriate coupling device connected this guide to the intramedullary guide before both drill holes were created in the distal aspect of the medial femoral condyle. The devices were removed and the posterior condylar cutting block inserted. The appropriate cut was made  using the reciprocating saw and this piece removed. The #0 spigot was inserted and the initial bone milling performed. A trial femoral component was inserted and both the flexion and extension gaps measured. In flexion, the gap measured 8 mm whereas in extension, it measured 7 mm. Therefore, the #1 spigot was selected and the secondary bone milling performed. Repeat sizing demonstrated symmetric flexion and extension gaps. The bone was removed from the postero-medial and postero-lateral aspects of the femoral condyle, as well as from the beneath the collar of the spigot. Bone also was removed from the anterior portion of the femur so as to minimize any potential impingement with the meniscal bearing insert. The trial components removed and several drill holes placed into the distal femoral condyle to further augment cement fixation.  Attention was redirected to the tibial side. The "A" sized tibial tray was positioned and temporarily secured using the appropriate spiked nail. The keel was created using the bi-bladed reciprocating saw and hoe. The keeled "A" sized trial tibial tray was inserted to be sure that it seated properly. At this point, a total of 20 cc of Exparel diluted out to 60 cc with normal saline and 30 cc of 0.5% Sensorcaine was injected in and around the posterior and medial capsular tissues, as well as the peri-incisional tissues to help with postoperative pain control.  The bony surfaces were prepared for cementing by irrigating them thoroughly with bacitracin saline solution using the jet lavage system before packing them with a dry Ray-Tec sponge. Meanwhile, cement was being mixed on the back table. When the cement was ready, the tibial tray was cemented in first. The excess cement was removed using a Surveyor, quantity after impacting it into place. Next, the femoral component was impacted into place. Again the excess cement was removed using a Surveyor, quantity. The 5 mm spacer was inserted  and the knee brought into near full extension while the cement hardened. Once the cement hardened, the spacer was removed and the 4 mm meniscal bearing insert was trialed. This demonstrated excellent tracking while the knee was placed through a range of motion, and showed no evidence towards subluxation or dislocation. Therefore, the permanent 4 mm meniscal bearing insert was snapped into position after verifying that no cement in the retained posteriorly. Again the knee was placed through a range of motion with the findings as described above.  The wound was copiously irrigated with bacitracin saline solution via the jet lavage system before the retinacular layer was reapproximated using #0 Vicryl interrupted sutures. At this point, 1 g of transexemic acid in 10 cc of normal saline was injected intra-articularly. The subcutaneous tissues were closed in two layers using 2-0 Vicryl interrupted sutures before the skin was closed using staples. A sterile occlusive dressing was applied to the knee before the patient was awakened. The patient was transferred back to his hospital bed and returned to the recovery room in satisfactory condition after tolerating the procedure well. A Polar Care device was applied to the knee as well.

## 2014-12-12 NOTE — NC FL2 (Deleted)
[place] LEVEL OF CARE SCREENING TOOL     IDENTIFICATION  Patient Name: Allen Chang Birthdate: 1932-09-16 Sex: male Admission Date (Current Location): 12/12/2014  [facility] and [place] Number:  [facility] )  Facility and Address:      Provider Number: [id number]   Attending Physician Name and Address:  Corky Mull, MD  Relative Name and Address:       Current Level of Care: Hospital Recommended Level of Care: Nursing Facility Prior Approval Number:    Date Approved/Denied:   PASRR Number:    Discharge Plan: SNF    Current Diagnoses: Patient Active Problem List   Diagnosis Date Noted  . Status post left partial knee replacement 12/12/2014  . Bradycardia 10/10/2014  . Severe sinus bradycardia 10/10/2014  Actinic Keratosis Allergic state Anemia Asthma without status asthmaticus Prostate cancer Chronic Kindey disease stage 3 GERD Hiatal hernia Hyperlipidemia Hypertension Occasional tremors Osteoarthritis (bilateral)   DISORIENTED AMBULATORY STATUS BLADDER BOWEL  Constantly Semi-Ambulatory Indwelling catheter Continent  INAPPROPRIATE BEHAVIOR FUNCTIONAL LIMITATIONS COMMUNICATION OF NEEDS RESPIRATION   (N/A)  (N/A) Verbally Normal (Room Air )  PERSONAL CARE ASSISTANCE ACTIVITIES/SOCIAL SKIN NUTRITION STATUS  Bathing, Dressing (Extensive Assist ) Active Other (Comment) (Incision: Left Knee) Diet (Diet: Heart Healthy )  PHYSICIAN VISITS NEUROLOGICAL    30 days  (N/A)     SPECIAL CARE FACTORS FREQUENCY  PT (By licensed PT) (PT and OT )     PT Frequency:  (5)             Current Medications (12/12/2014): Current Facility-Administered Medications  Medication Dose Route Frequency Provider Last Rate Last Dose  . [START ON 12/13/2014] acetaminophen (TYLENOL) tablet 650 mg  650 mg Oral Q6H PRN Corky Mull, MD       Or  . Derrill Memo ON 12/13/2014] acetaminophen (TYLENOL) suppository 650 mg  650 mg Rectal Q6H PRN Corky Mull, MD       . acetaminophen (TYLENOL) tablet 1,000 mg  1,000 mg Oral 4 times per day Corky Mull, MD   1,000 mg at 12/12/14 1414  . aspirin chewable tablet 81 mg  81 mg Oral Daily Corky Mull, MD   81 mg at 12/12/14 1221  . bisacodyl (DULCOLAX) suppository 10 mg  10 mg Rectal Daily PRN Corky Mull, MD      . ceFAZolin (ANCEF) 2-3 GM-% IVPB SOLR           . ceFAZolin (ANCEF) IVPB 2 g/50 mL premix  2 g Intravenous 3 times per day Corky Mull, MD      . dextrose 5 % and 0.9 % NaCl with KCl 20 mEq/L infusion   Intravenous Continuous Corky Mull, MD 75 mL/hr at 12/12/14 1237    . diphenhydrAMINE (BENADRYL) 12.5 MG/5ML elixir 12.5-25 mg  12.5-25 mg Oral Q4H PRN Corky Mull, MD      . docusate sodium (COLACE) capsule 100 mg  100 mg Oral BID Corky Mull, MD   100 mg at 12/12/14 1221  . [START ON 12/13/2014] enoxaparin (LOVENOX) injection 40 mg  40 mg Subcutaneous Q24H Corky Mull, MD      . HYDROmorphone (DILAUDID) injection 0.5-1 mg  0.5-1 mg Intravenous Q2H PRN Corky Mull, MD   0.5 mg at 12/12/14 1243  . lisinopril (PRINIVIL,ZESTRIL) tablet 10 mg  10 mg Oral Daily Corky Mull, MD   10 mg at 12/12/14 1415  . loratadine (CLARITIN) tablet 10 mg  10 mg Oral Daily Corky Mull, MD   10 mg at 12/12/14 1222  . magnesium hydroxide (MILK OF MAGNESIA) suspension 30 mL  30 mL Oral Daily PRN Corky Mull, MD      . metoCLOPramide (REGLAN) tablet 5-10 mg  5-10 mg Oral Q8H PRN Corky Mull, MD       Or  . metoCLOPramide (REGLAN) injection 5-10 mg  5-10 mg Intravenous Q8H PRN Corky Mull, MD      . multivitamin with minerals tablet 1 tablet  1 tablet Oral Daily Corky Mull, MD   1 tablet at 12/12/14 1223  . omega-3 acid ethyl esters (LOVAZA) capsule 1 g  1 g Oral Daily Corky Mull, MD   1 g at 12/12/14 1223  . ondansetron (ZOFRAN) tablet 4 mg  4 mg Oral Q6H PRN Corky Mull, MD       Or  . ondansetron [facility]) injection 4 mg  4 mg Intravenous Q6H PRN Corky Mull, MD      . oxyCODONE (Oxy IR/ROXICODONE)  immediate release tablet 5-10 mg  5-10 mg Oral Q4H PRN Corky Mull, MD      . pantoprazole (PROTONIX) EC tablet 40 mg  40 mg Oral Daily Corky Mull, MD   40 mg at 12/12/14 1223  . primidone (MYSOLINE) tablet 50 mg  50 mg Oral BID Corky Mull, MD   50 mg at 12/12/14 1415  . sodium phosphate (FLEET) 7-19 GM/118ML enema 1 enema  1 enema Rectal Once PRN Corky Mull, MD      . traMADol Veatrice Bourbon) tablet 50-100 mg  50-100 mg Oral Q6H PRN Corky Mull, MD       Do not use this list as official medication orders. Please verify with discharge summary.  Discharge Medications:   Medication List    ASK your doctor about these medications        aspirin 81 MG tablet  Take 81 mg by mouth daily. In am     Black Cherry Concentrate Liqd  Take 2 Doses by mouth daily.     docusate sodium 100 MG capsule  Commonly known as:  COLACE  Take 200 mg by mouth daily.     Fish Oil 1000 MG Caps  Take 1 capsule by mouth daily.     Flax Seed Oil 1000 MG Caps  Take 1 capsule by mouth daily.     Garlic 9937 MG Caps  Take 1 capsule by mouth every morning.     glucosamine-chondroitin 500-400 MG tablet  Take 1 tablet by mouth 2 (two) times daily. Glucosamine 1500 mg and Chondrotoin 1200 mg     lisinopril 20 MG tablet  Commonly known as:  PRINIVIL,ZESTRIL  Take 10 mg by mouth daily. In am     loratadine 10 MG dissolvable tablet  Commonly known as:  CLARITIN REDITABS  Take 10 mg by mouth daily. In am     multivitamin with minerals tablet  Take 1 tablet by mouth daily. In am     omeprazole 20 MG capsule  Commonly known as:  PRILOSEC  Take 20 mg by mouth 2 (two) times daily before a meal.     oxyCODONE-acetaminophen 5-325 MG tablet  Commonly known as:  ROXICET  Take 0.5 tablets by mouth every 4 (four) hours as needed for moderate pain.     primidone 50 MG tablet  Commonly known as:  MYSOLINE  Take 50 mg by mouth  2 (two) times daily.        Relevant Imaging Results:  Relevant Lab  Results:  Recent Labs    Additional Information  (Full Code. Allergies: Percocet Oxycodone-acetaminophen, Voltaren Diclofenac Sodium)  Loralyn Freshwater, LCSW

## 2014-12-13 LAB — BASIC METABOLIC PANEL
ANION GAP: 6 (ref 5–15)
BUN: 14 mg/dL (ref 6–20)
CALCIUM: 8.1 mg/dL — AB (ref 8.9–10.3)
CO2: 24 mmol/L (ref 22–32)
Chloride: 107 mmol/L (ref 101–111)
Creatinine, Ser: 1.07 mg/dL (ref 0.61–1.24)
Glucose, Bld: 125 mg/dL — ABNORMAL HIGH (ref 65–99)
Potassium: 4.2 mmol/L (ref 3.5–5.1)
SODIUM: 137 mmol/L (ref 135–145)

## 2014-12-13 LAB — CBC WITH DIFFERENTIAL/PLATELET
BASOS ABS: 0 10*3/uL (ref 0–0.1)
Basophils Relative: 0 %
EOS PCT: 3 %
Eosinophils Absolute: 0.3 10*3/uL (ref 0–0.7)
HCT: 35 % — ABNORMAL LOW (ref 40.0–52.0)
Hemoglobin: 12 g/dL — ABNORMAL LOW (ref 13.0–18.0)
Lymphocytes Relative: 36 %
Lymphs Abs: 3.4 10*3/uL (ref 1.0–3.6)
MCH: 34.5 pg — ABNORMAL HIGH (ref 26.0–34.0)
MCHC: 34.4 g/dL (ref 32.0–36.0)
MCV: 100.4 fL — ABNORMAL HIGH (ref 80.0–100.0)
MONO ABS: 0.7 10*3/uL (ref 0.2–1.0)
Monocytes Relative: 8 %
NEUTROS ABS: 4.9 10*3/uL (ref 1.4–6.5)
Neutrophils Relative %: 53 %
PLATELETS: 105 10*3/uL — AB (ref 150–440)
RBC: 3.48 MIL/uL — ABNORMAL LOW (ref 4.40–5.90)
RDW: 12.9 % (ref 11.5–14.5)
WBC: 9.2 10*3/uL (ref 3.8–10.6)

## 2014-12-13 LAB — SURGICAL PATHOLOGY

## 2014-12-13 NOTE — Progress Notes (Signed)
Patient ambulated down hallway and around nurses station this afternoon with PT. Was able to climb stairs, and met all goals with PT. Plan for D/C  w/ home health most likely tomorrow. Tolerated activity well, PRN Oxy given with good effect. Family continues at bedside. No BM passed, resident requested prune juice at lunchtime. Will continue to monitor.

## 2014-12-13 NOTE — Progress Notes (Signed)
Pt ambulated to chair with P/T this AM. Tolerated well. Foley cath D/C'd by night shift nursing staff, pt able to urinate this AM without difficulty. Uses urinal with assistance d/t tremors of both hands. Ate 100% of breakfast this AM. No BM,  is passing large amts of gas per pt. Family visiting at bedside. Mood is pleasant and motivation is good.

## 2014-12-13 NOTE — Evaluation (Signed)
Physical Therapy Evaluation Patient Details Name: Allen Chang MRN: [id number] DOB: 13-Jan-1933 Today's Date: 12/13/2014   History of Present Illness  Pt was admitted to the hospital s/p L partial knee replacement on 12/12/14  Clinical Impression  Pt presents with hx of HTN, GERD, tremors, cancer, and dysrhythmia. Pt was able to perform 10 SLRs successfully and without assistance, therefore KI was not donned on LLE. Examination reveals that pt performs bed mobility at mod I, transfers at supervision, and ambulation of 30 ft at [facility]. Pt shows great strength with transfers and ambulation. He has no buckling and tolerates his pain very well. Pt will be progressed in ambulation this afternoon and possibly will attempt stairs if within pt's tolerance. 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     Follow Up Recommendations Home health PT;Supervision - Intermittent    Equipment Recommendations  None recommended by PT    Recommendations for Other Services       Precautions / Restrictions Precautions Precautions: Fall;Knee Precaution Booklet Issued: No Restrictions Weight Bearing Restrictions: Yes Other Position/Activity Restrictions: WBAT (Per Dr. Roland Rack and Cameron Proud )      Mobility  Bed Mobility Overal bed mobility: Modified Independent             General bed mobility comments: Pt performs bed mobility with good use of side rails and is able to control his   Transfers Overall transfer level: Needs assistance Equipment used: Rolling walker (2 wheeled) Transfers: Sit to/from Stand Sit to Stand: Supervision         General transfer comment: Pt able to perform transfer with good confidence and functional strength. He needs cue on hand placement. Steady with no LOB during transfers  Ambulation/Gait Ambulation/Gait assistance: Min  guard Ambulation Distance (Feet): 30 Feet (F/B) Assistive device: Rolling walker (2 wheeled) Gait Pattern/deviations: Decreased step length - right;Decreased step length - left;Step-through pattern;Decreased stride length Gait velocity: decreased Gait velocity interpretation: Below normal speed for age/gender General Gait Details: Pt demonstrates reciprocal gait pattern with decreased step lengths bilaterally. He needs minor cues for sequencing of RW with gait. He is very stable with forward/back ambulation of 30 ft  Stairs            Wheelchair Mobility    Modified Rankin (Stroke Patients Only)       Balance Overall balance assessment: No apparent balance deficits (not formally assessed)                                           Pertinent Vitals/Pain Pain Assessment: 0-10 Pain Score: 4  Pain Location: R knee Pain Intervention(s): Limited activity within patient's tolerance;Monitored during session;Premedicated before session;Ice applied    Home Living Family/patient expects to be discharged to:: Private residence Living Arrangements: Spouse/significant other Available Help at Discharge: Family;Available 24 hours/day (wife) Type of Home: House Home Access: Stairs to enter Entrance Stairs-Rails: Right Entrance Stairs-Number of Steps: 3 Home Layout: One level Home Equipment: Walker - 2 wheels;Cane - single point      Prior Function Level of Independence: Independent with assistive device(s)         Comments: Pt is currently still working as a Psychologist, sport and exercise. He is very active on a daily basis and indep with ambulation, ADLs, and  still driving      Hand Dominance        Extremity/Trunk Assessment   Upper Extremity Assessment: Overall WFL for tasks assessed           Lower Extremity Assessment: LLE deficits/detail   LLE Deficits / Details: Gross MMT 3/5 LLE  (able to perform full AROM)     Communication   Communication: No difficulties   Cognition Arousal/Alertness: Awake/alert Behavior During Therapy: WFL for tasks assessed/performed Overall Cognitive Status: Within Functional Limits for tasks assessed                      General Comments      Exercises Total Joint Exercises Goniometric ROM: L knee AAROM: 5 - 116 degrees Other Exercises Other Exercises: Pt performed bilateral therex x 10 reps at supervision for proper technique. Exercises included: ankle pumps, quad sets, glute sets, SLR, hip abd, and heel slides      Assessment/Plan    PT Assessment Patient needs continued PT services  PT Diagnosis Difficulty walking;Abnormality of gait;Acute pain   PT Problem List Decreased strength;Decreased range of motion;Decreased activity tolerance;Decreased mobility;Pain  PT Treatment Interventions DME instruction;Gait training;Stair training;Functional mobility training;Therapeutic activities;Therapeutic exercise;Balance training;Neuromuscular re-education   PT Goals (Current goals can be found in the Care Plan section) Acute Rehab PT Goals Patient Stated Goal: to return home PT Goal Formulation: With patient Time For Goal Achievement: 01/27/15 Potential to Achieve Goals: Good    Frequency BID   Barriers to discharge        Co-evaluation               End of Session Equipment Utilized During Treatment: Gait belt Activity Tolerance: Patient tolerated treatment well Patient left: in chair;with call bell/phone within reach;with chair alarm set;with SCD's reapplied Nurse Communication: Mobility status         Time: 3094-0768 PT Time Calculation (min) (ACUTE ONLY): 24 min   Charges:         PT G CodesJanyth Contes 12-28-2014, 11:40 AM  Janyth Contes, SPT. [phone]

## 2014-12-13 NOTE — Progress Notes (Signed)
POD 1. VSS. Pain relieved with IV pain meds. Neurochecks WDL. Polarcare on and running. IS at bedside and pt. Instructed on use. Rested quietly throughout the night.

## 2014-12-13 NOTE — Progress Notes (Signed)
Clinical Social Worker (CSW) received SNF consult. PT is recommending home health. RN Case Manager aware of above. Please reconsult if future social work needs arise. CSW signing off.   Pier Bosher Morgan, LCSWA [phone] 

## 2014-12-13 NOTE — Progress Notes (Signed)
Physical Therapy Treatment Patient Details Name: Allen Chang MRN: [id number] DOB: [birth year] Today's Date: 12/13/2014    History of Present Illness Pt was admitted to the hospital s/p L partial knee replacement on 12/12/14    PT Comments    Pt has met all PT goals this afternoon by ambulating around nursing station and demonstrating safe navigation of stairs. He is very willing to participate in therapy tasks and continues to show good strength and endurance relative to him being POD 1. He still has strength, ROM, and gait deficits that will benefit from skilled PT in order for him to return home safely. Pt is was given exercises packet and demonstrates eagerness to learn further therex.   Follow Up Recommendations  Home health PT;Supervision - Intermittent     Equipment Recommendations  None recommended by PT    Recommendations for Other Services       Precautions / Restrictions Precautions Precautions: Fall;Knee Precaution Booklet Issued: No Restrictions Weight Bearing Restrictions: Yes Other Position/Activity Restrictions: WBAT    Mobility  Bed Mobility Overal bed mobility: Modified Independent             General bed mobility comments: Pt performs bed mobility with good use of side rails and is able to control his   Transfers Overall transfer level: Needs assistance Equipment used: Rolling walker (2 wheeled) Transfers: Sit to/from Stand Sit to Stand: Supervision         General transfer comment: Pt able to perform transfer with good confidence and functional strength. He needs cue on hand placement. Steady with no LOB during transfers  Ambulation/Gait Ambulation/Gait assistance: Min guard Ambulation Distance (Feet): 220 Feet Assistive device: Rolling walker (2 wheeled) Gait Pattern/deviations: Step-through pattern;Decreased stance time - right;Decreased stance time - left Gait velocity: decreased Gait velocity interpretation: Below normal speed for  age/gender General Gait Details: Pt able to ambulate considerable distance this afternoon. He requires cues for step length in order to create symmetrical reciprocal gait pattern   Stairs Stairs: Yes Stairs assistance: Min guard Stair Management: One rail Right Number of Stairs: 4 General stair comments: Pt needs cues for sequence of stepping with stairs, as well as cues for advancing hands on hand rail. He is stable with stair navigation, requiring increased time to perform task.   Wheelchair Mobility    Modified Rankin (Stroke Patients Only)       Balance Overall balance assessment: No apparent balance deficits (not formally assessed)                                  Cognition Arousal/Alertness: Awake/alert Behavior During Therapy: WFL for tasks assessed/performed Overall Cognitive Status: Within Functional Limits for tasks assessed                      Exercises Total Joint Exercises Goniometric ROM: L knee AAROM: 5 - 116 degrees Other Exercises Other Exercises: Pt performed bilateral therex x 10 reps at supervision for proper technique. Exercises included: ankle pumps, quad sets, glute sets, SLR, hip abd, and heel slides    General Comments        Pertinent Vitals/Pain Pain Assessment: 0-10 Pain Score: 4  Pain Location: L knee Pain Intervention(s): Limited activity within patient's tolerance;Monitored during session;Premedicated before session;Utilized relaxation techniques    Home Living Family/patient expects to be discharged to:: Private residence Living Arrangements: Spouse/significant other Available Help at Discharge: Family;Available 24 hours/day (  wife) Type of Home: House Home Access: Stairs to enter Entrance Stairs-Rails: Right Home Layout: One level Home Equipment: Environmental consultant - 2 wheels;Cane - single point      Prior Function Level of Independence: Independent with assistive device(s)      Comments: Pt is currently still  working as a Psychologist, sport and exercise. He is very active on a daily basis and indep with ambulation, ADLs, and still driving    PT Goals (current goals can now be found in the care plan section) Acute Rehab PT Goals Patient Stated Goal: to practice stairs  PT Goal Formulation: With patient Time For Goal Achievement: 01/27/15 Potential to Achieve Goals: Good Progress towards PT goals: Progressing toward goals    Frequency  BID    PT Plan Current plan remains appropriate    Co-evaluation             End of Session Equipment Utilized During Treatment: Gait belt Activity Tolerance: Patient tolerated treatment well Patient left: with call bell/phone within reach;with SCD's reapplied;in bed;with bed alarm set     Time: 1342-1406 PT Time Calculation (min) (ACUTE ONLY): 24 min  Charges:  $Gait Training: 8-22 mins $Therapeutic Exercise: 8-22 mins                    G CodesJanyth Contes 12-29-14, 3:06 PM  Janyth Contes, SPT. [phone]

## 2014-12-13 NOTE — Progress Notes (Signed)
  Subjective: 1 Day Post-Op Procedure(s) (LRB): UNICOMPARTMENTAL KNEE (Left) Patient reports pain as mild.   Patient is well, and has had no acute complaints or problems Plan is to go Home after hospital stay. Negative for chest pain and shortness of breath Fever: no Gastrointestinal:Negative for nausea and vomiting  Objective: Vital signs in last 24 hours: Temp:  [97 F (36.1 C)-98.1 F (36.7 C)] 98.1 F (36.7 C) (10/26 0740) Pulse Rate:  [54-86] 62 (10/26 0740) Resp:  [16-22] 18 (10/26 0740) BP: (108-177)/(60-96) 134/66 mmHg (10/26 0740) SpO2:  [93 %-100 %] 99 % (10/26 0740) FiO2 (%):  [21 %] 21 % (10/25 1109)  Intake/Output from previous day:  Intake/Output Summary (Last 24 hours) at 12/13/14 0807 Last data filed at 12/13/14 0554  Gross per 24 hour  Intake 3346.25 ml  Output   2360 ml  Net 986.25 ml    Intake/Output this shift:    Labs:  Recent Labs  12/13/14 0406  HGB 12.0*    Recent Labs  12/13/14 0406  WBC 9.2  RBC 3.48*  HCT 35.0*  PLT 105*    Recent Labs  12/13/14 0406  NA 137  K 4.2  CL 107  CO2 24  BUN 14  CREATININE 1.07  GLUCOSE 125*  CALCIUM 8.1*   No results for input(s): LABPT, INR in the last 72 hours.   EXAM General - Patient is Alert, Appropriate and Oriented Extremity - Neurologically intact ABD soft Sensation intact distally Intact pulses distally Dorsiflexion/Plantar flexion intact Incision: scant drainage No cellulitis present Dressing/Incision - blood tinged drainage Motor Function - intact, moving foot and toes well on exam.   ACE wrap removed today to observed the underlying incision. Abdomen soft on exam with normal BS.  Past Medical History  Diagnosis Date  . Hypertension   . GERD (gastroesophageal reflux disease)   . Elevated lipids   . Tremors of nervous system   . Complication of anesthesia     bradycardia, in ICU for 24 hour after galbladder surgery.   . Cancer [facility])     prostate   .  Diverticulosis 2012  . Dysrhythmia     Assessment/Plan: 1 Day Post-Op Procedure(s) (LRB): UNICOMPARTMENTAL KNEE (Left) Active Problems:   Status post left partial knee replacement  Estimated body mass index is 26.16 kg/(m^2) as calculated from the following:   Height as of this encounter: 5\' 8"  (1.727 m).   Weight as of this encounter: 78.019 kg (172 lb). Advance diet Up with therapy D/C IV fluids when tolerating PO intake.  Labs reviewed.  CBC and BMP ordered for tomorrow morning. Pt is doing well with no acute complaints. Foley removed this AM.  Instructed patient that he will need to urinate without Foley before discharge. Pt to start with PT today.  Unable to work with PT yesterday due to block still being in-place. Plan is to discharge home tomorrow morning pending PT and pain control.  DVT Prophylaxis - Lovenox, Foot Pumps and TED hose Weight-Bearing as tolerated to left leg  J. Cameron Proud, PA-C [facility] 12/13/2014, 8:07 AM

## 2014-12-13 NOTE — Progress Notes (Signed)
Physical Therapy Treatment Patient Details Name: Allen Chang MRN: [id number] DOB: Feb 20, 1932 Today's Date: 12/13/2014    History of Present Illness Pt was admitted to the hospital s/p L partial knee replacement on 12/12/14    PT Comments    Pt has met all PT goals this afternoon by ambulating around nursing station and demonstrating safe navigation of stairs. He is very willing to participate in therapy tasks and continues to show good strength and endurance relative to him being POD 1. He still has strength, ROM, and gait deficits that will benefit from skilled PT in order for him to return home safely. Pt is was given exercises packet and demonstrates eagerness to learn further therex.   Follow Up Recommendations  Home health PT;Supervision - Intermittent     Equipment Recommendations  None recommended by PT    Recommendations for Other Services       Precautions / Restrictions Precautions Precautions: Fall;Knee Precaution Booklet Issued: No Restrictions Weight Bearing Restrictions: Yes Other Position/Activity Restrictions: WBAT    Mobility  Bed Mobility Overal bed mobility: Modified Independent             General bed mobility comments: Pt performs bed mobility with good use of side rails and is able to control his   Transfers Overall transfer level: Needs assistance Equipment used: Rolling walker (2 wheeled) Transfers: Sit to/from Stand Sit to Stand: Supervision         General transfer comment: Pt able to perform transfer with good confidence and functional strength. He needs cue on hand placement. Steady with no LOB during transfers  Ambulation/Gait Ambulation/Gait assistance: Min guard Ambulation Distance (Feet): 220 Feet Assistive device: Rolling walker (2 wheeled) Gait Pattern/deviations: Step-through pattern;Decreased stance time - right;Decreased stance time - left Gait velocity: decreased Gait velocity interpretation: Below normal speed for  age/gender General Gait Details: Pt able to ambulate considerable distance this afternoon. He requires cues for step length in order to create symmetrical reciprocal gait pattern   Stairs            Wheelchair Mobility    Modified Rankin (Stroke Patients Only)       Balance Overall balance assessment: No apparent balance deficits (not formally assessed)                                  Cognition Arousal/Alertness: Awake/alert Behavior During Therapy: WFL for tasks assessed/performed Overall Cognitive Status: Within Functional Limits for tasks assessed                      Exercises Total Joint Exercises Goniometric ROM: L knee AAROM: 5 - 116 degrees Other Exercises Other Exercises: Pt performed bilateral therex x 10 reps at supervision for proper technique. Exercises included: ankle pumps, quad sets, glute sets, SLR, hip abd, and heel slides    General Comments        Pertinent Vitals/Pain Pain Assessment: 0-10 Pain Score: 4  Pain Location: L knee Pain Intervention(s): Limited activity within patient's tolerance;Monitored during session;Premedicated before session;Utilized relaxation techniques    Home Living Family/patient expects to be discharged to:: Private residence Living Arrangements: Spouse/significant other Available Help at Discharge: Family;Available 24 hours/day (wife) Type of Home: House Home Access: Stairs to enter Entrance Stairs-Rails: Right Home Layout: One level Home Equipment: Environmental consultant - 2 wheels;Cane - single point      Prior Function Level of Independence: Independent with assistive device(s)  Comments: Pt is currently still working as a Psychologist, sport and exercise. He is very active on a daily basis and indep with ambulation, ADLs, and still driving    PT Goals (current goals can now be found in the care plan section) Acute Rehab PT Goals Patient Stated Goal: to practice stairs  PT Goal Formulation: With patient Time For Goal  Achievement: 01/27/15 Potential to Achieve Goals: Good Progress towards PT goals: Progressing toward goals    Frequency  BID    PT Plan Current plan remains appropriate    Co-evaluation             End of Session Equipment Utilized During Treatment: Gait belt Activity Tolerance: Patient tolerated treatment well Patient left: with call bell/phone within reach;with SCD's reapplied;in bed;with bed alarm set     Time: 1342-1406 PT Time Calculation (min) (ACUTE ONLY): 24 min  Charges:  $Therapeutic Exercise: 8-22 mins                    G CodesJanyth Contes Jan 09, 2015, 2:52 PM  Janyth Contes, SPT. [phone]

## 2014-12-13 NOTE — Progress Notes (Signed)
Foley d/c'd at 0600 

## 2014-12-13 NOTE — Care Management Note (Addendum)
Case Management Note  Patient Details  Name: Allen Chang MRN: [id number] Date of Birth: 09-04-1932  Subjective/Objective:                  Met with patient and his wife to discuss discharge planning. Patient agrees to SNF or home health at discharge. He prefers [facility]. He has a bedside commode and rolling walker at home. He uses [facility] for Rx [phone]. PT evaluation pending.   Action/Plan: List of home health agencies left with patient on day of surgery.RNCM will continue to follow. Referral made to Jerry/Tim with [facility]. Lovenox 15m #14 will be called in to [facility] if PT makes HHPT recommendation.   Expected Discharge Date:                  Expected Discharge Plan:     In-House Referral:  Clinical Social Work  Discharge planning Services  CM Consult  Post Acute Care Choice:  Home Health Choice offered to:  Patient, Spouse  DME Arranged:  N/A DME Agency:     HH Arranged:  PT HH Agency:  [facility] Status of Service:  In process, will continue to follow  Medicare Important Message Given:    Date Medicare IM Given:    Medicare IM give by:    Date Additional Medicare IM Given:    Additional Medicare Important Message give by:     If discussed at [facility] Stay Meetings, dates discussed:    Additional Comments: Lovenox $37.45.  AMarshell Garfinkel RN 12/13/2014, 8:50 AM

## 2014-12-14 LAB — CBC
HCT: 37.1 % — ABNORMAL LOW (ref 40.0–52.0)
HEMOGLOBIN: 12.5 g/dL — AB (ref 13.0–18.0)
MCH: 33.8 pg (ref 26.0–34.0)
MCHC: 33.6 g/dL (ref 32.0–36.0)
MCV: 100.6 fL — ABNORMAL HIGH (ref 80.0–100.0)
PLATELETS: 115 10*3/uL — AB (ref 150–440)
RBC: 3.69 MIL/uL — ABNORMAL LOW (ref 4.40–5.90)
RDW: 13.1 % (ref 11.5–14.5)
WBC: 11.1 10*3/uL — ABNORMAL HIGH (ref 3.8–10.6)

## 2014-12-14 LAB — BASIC METABOLIC PANEL
ANION GAP: 8 (ref 5–15)
BUN: 38 mg/dL — ABNORMAL HIGH (ref 6–20)
CALCIUM: 8.5 mg/dL — AB (ref 8.9–10.3)
CO2: 23 mmol/L (ref 22–32)
CREATININE: 1.16 mg/dL (ref 0.61–1.24)
Chloride: 103 mmol/L (ref 101–111)
GFR calc non Af Amer: 58 mL/min — ABNORMAL LOW (ref >60)
GLUCOSE: 168 mg/dL — AB (ref 65–99)
Potassium: 4.5 mmol/L (ref 3.5–5.1)
Sodium: 134 mmol/L — ABNORMAL LOW (ref 135–145)

## 2014-12-14 MED ORDER — OXYCODONE HCL 5 MG PO TABS: 5.0000 mg | ORAL_TABLET | ORAL | Status: DC | PRN

## 2014-12-14 MED ORDER — TRAMADOL HCL 50 MG PO TABS: 50.0000 mg | ORAL_TABLET | Freq: Four times a day (QID) | ORAL | Status: DC | PRN

## 2014-12-14 NOTE — Discharge Instructions (Signed)

## 2014-12-14 NOTE — Progress Notes (Signed)
Physical Therapy Treatment Patient Details Name: Allen Chang MRN: [id number] DOB: 1932-06-20 Today's Date: 12/14/2014    History of Present Illness Pt was admitted to the hospital s/p L partial knee replacement on 12/12/14    PT Comments    Pt continues to improve with quantity and quality of ambulation. He is able to ambulate and stand for up to 3 minutes before continuing ambulation without any significant fatigue. He demonstrates understanding of his HEP before HHPT arrives and his wife continues to be of big support to him. He still has strength, ROM, and gait deficits that will continue to benefit from skilled PT in order for him to return to PLOF.    Follow Up Recommendations  Home health PT;Supervision - Intermittent     Equipment Recommendations  None recommended by PT    Recommendations for Other Services       Precautions / Restrictions Precautions Precautions: Fall;Knee Precaution Booklet Issued: No Restrictions Weight Bearing Restrictions: Yes Other Position/Activity Restrictions: WBAT    Mobility  Bed Mobility Overal bed mobility: Modified Independent             General bed mobility comments: Pt performs bed mobility with good use of side rails and is able to control his   Transfers Overall transfer level: Needs assistance Equipment used: Rolling walker (2 wheeled) Transfers: Sit to/from Stand Sit to Stand: Supervision         General transfer comment: Pt able to perform transfer with good confidence and functional strength. He needs cue on hand placement. Steady with no LOB during transfers  Ambulation/Gait Ambulation/Gait assistance: Min guard Ambulation Distance (Feet): 250 Feet Assistive device: Rolling walker (2 wheeled) Gait Pattern/deviations: Step-through pattern;Decreased stride length Gait velocity: decreased Gait velocity interpretation: Below normal speed for age/gender General Gait Details: Pt performing well with reciprocal  gait. Still requires cues to maintain symmetry of step    Stairs            Wheelchair Mobility    Modified Rankin (Stroke Patients Only)       Balance Overall balance assessment: No apparent balance deficits (not formally assessed)                                  Cognition Arousal/Alertness: Awake/alert Behavior During Therapy: WFL for tasks assessed/performed Overall Cognitive Status: Within Functional Limits for tasks assessed                      Exercises Total Joint Exercises Goniometric ROM: L knee AAROM: 4 - 122 degrees  Other Exercises Other Exercises: Pt performed bilateral therex x 15 reps at supervision for proper technique. Exercises included: ankle pumps, quad sets, glute sets, SLR, hip abd, and heel slides    General Comments        Pertinent Vitals/Pain Pain Assessment: 0-10 Pain Score: 4  Pain Location: L knee Pain Intervention(s): Limited activity within patient's tolerance;Monitored during session;Premedicated before session;Ice applied    Home Living                      Prior Function            PT Goals (current goals can now be found in the care plan section) Acute Rehab PT Goals Patient Stated Goal: to walk PT Goal Formulation: With patient Time For Goal Achievement: 01/27/15 Potential to Achieve Goals: Good Progress towards PT goals:  Progressing toward goals    Frequency  BID    PT Plan Current plan remains appropriate    Co-evaluation             End of Session Equipment Utilized During Treatment: Gait belt Activity Tolerance: Patient tolerated treatment well Patient left: in chair;with call bell/phone within reach;with family/visitor present;with SCD's reapplied;with chair alarm set     Time: 2585-2778 PT Time Calculation (min) (ACUTE ONLY): 23 min  Charges:                       G CodesJanyth Contes 2014-12-17, 4:19 PM Janyth Contes, SPT. [phone]

## 2014-12-14 NOTE — Anesthesia Postprocedure Evaluation (Signed)
2 Anesthesia Post-op Note  Patient: Allen Chang  Procedure(s) Performed: Procedure(s): UNICOMPARTMENTAL KNEE (Left)  Anesthesia type:Spinal  Patient location: PACU  Post pain: Pain level controlled  Post assessment: Post-op Vital signs reviewed, Patient's Cardiovascular Status Stable, Respiratory Function Stable, Patent Airway and No signs of Nausea or vomiting  Post vital signs: Reviewed and stable  Last Vitals:  Filed Vitals:   12/14/14 0400  BP: 122/64  Pulse: 103  Temp: 37.3 C  Resp: 18    Level of consciousness: awake, alert  and patient cooperative  Complications: No apparent anesthesia complications

## 2014-12-14 NOTE — Discharge Summary (Signed)
Physician Discharge Summary  Patient ID: Allen Chang MRN: [id number] DOB/AGE: Jul 07, 1932 81 y.o.  Admit date: 12/12/2014 Discharge date: 12/14/2014  Admission Diagnoses:  OSTEOARTHRITIS Osteoarthritis of medial compartment, left knee  Discharge Diagnoses: Patient Active Problem List   Diagnosis Date Noted  . Status post left partial knee replacement 12/12/2014  . Bradycardia 10/10/2014  . Severe sinus bradycardia 10/10/2014  Osteoarthritis of medial compartment, left knee  Past Medical History  Diagnosis Date  . Hypertension   . GERD (gastroesophageal reflux disease)   . Elevated lipids   . Tremors of nervous system   . Complication of anesthesia     bradycardia, in ICU for 24 hour after galbladder surgery.   . Cancer [facility])     prostate   . Diverticulosis 2012  . Dysrhythmia      Transfusion: none   Consultants (if any):  none  Discharged Condition: Improved  Hospital Course: Allen Chang is an 81 y.o. male who was admitted 12/12/2014 with a diagnosis of Osteoarthritis of medial compartment, left knee and went to the operating room on 12/12/2014 and underwent the above named procedures.    Surgeries: Procedure(s): UNICOMPARTMENTAL KNEE on 12/12/2014 Patient tolerated the surgery well. Taken to PACU where she was stabilized and then transferred to the orthopedic floor.  Started on Lovenox 40mg  q 24 hrs. Foot pumps applied bilaterally at 80 mm. Heels elevated on bed with rolled towels. No evidence of DVT. Negative Homan. Physical therapy started on day #1 for gait training and transfer. OT started day #1 for ADL and assisted devices.  Patient's IV , foley were d/c on POD1  Implants: All-cemented Biomet [place] system with a Small femoral component, an "A" sized tibial tray, and a 4 mm meniscal bearing insert.  He was given perioperative antibiotics:  Anti-infectives    Start     Dose/Rate Route Frequency Ordered Stop   12/12/14 1600  ceFAZolin (ANCEF) IVPB  2 g/50 mL premix     2 g 100 mL/hr over 30 Minutes Intravenous 3 times per day 12/12/14 1413 12/13/14 0618   12/12/14 0551  ceFAZolin (ANCEF) 2-3 GM-% IVPB SOLR    Comments:  Ronnell Freshwater: cabinet override      12/12/14 0551 12/12/14 1759   12/12/14 0115  ceFAZolin (ANCEF) IVPB 2 g/50 mL premix     2 g 100 mL/hr over 30 Minutes Intravenous  Once 12/12/14 0110 12/12/14 0810    .  He was given sequential compression devices, early ambulation, and Lovenox for DVT prophylaxis.  He benefited maximally from the hospital stay and there were no complications.    Recent vital signs:  Filed Vitals:   12/14/14 0819  BP: 138/82  Pulse: 98  Temp: 99.4 F (37.4 C)  Resp:     Recent laboratory studies:  Lab Results  Component Value Date   HGB 12.5* 12/14/2014   HGB 12.0* 12/13/2014   HGB 13.8 12/06/2014   Lab Results  Component Value Date   WBC 11.1* 12/14/2014   PLT 115* 12/14/2014   Lab Results  Component Value Date   INR 0.98 12/06/2014   Lab Results  Component Value Date   NA 134* 12/14/2014   K 4.5 12/14/2014   CL 103 12/14/2014   CO2 23 12/14/2014   BUN 38* 12/14/2014   CREATININE 1.16 12/14/2014   GLUCOSE 168* 12/14/2014    Discharge Medications:     Medication List    STOP taking these medications  oxyCODONE-acetaminophen 5-325 MG tablet  Commonly known as:  ROXICET      TAKE these medications        aspirin 81 MG tablet  Take 81 mg by mouth daily. In am     Black Cherry Concentrate Liqd  Take 2 Doses by mouth daily.     docusate sodium 100 MG capsule  Commonly known as:  COLACE  Take 200 mg by mouth daily.     Fish Oil 1000 MG Caps  Take 1 capsule by mouth daily.     Flax Seed Oil 1000 MG Caps  Take 1 capsule by mouth daily.     Garlic 3428 MG Caps  Take 1 capsule by mouth every morning.     glucosamine-chondroitin 500-400 MG tablet  Take 1 tablet by mouth 2 (two) times daily. Glucosamine 1500 mg and Chondrotoin 1200 mg      lisinopril 20 MG tablet  Commonly known as:  PRINIVIL,ZESTRIL  Take 10 mg by mouth daily. In am     loratadine 10 MG dissolvable tablet  Commonly known as:  CLARITIN REDITABS  Take 10 mg by mouth daily. In am     multivitamin with minerals tablet  Take 1 tablet by mouth daily. In am     omeprazole 20 MG capsule  Commonly known as:  PRILOSEC  Take 20 mg by mouth 2 (two) times daily before a meal.     oxyCODONE 5 MG immediate release tablet  Commonly known as:  Oxy IR/ROXICODONE  Take 1-2 tablets (5-10 mg total) by mouth every 4 (four) hours as needed for breakthrough pain.     primidone 50 MG tablet  Commonly known as:  MYSOLINE  Take 50 mg by mouth 2 (two) times daily.     traMADol 50 MG tablet  Commonly known as:  ULTRAM  Take 1-2 tablets (50-100 mg total) by mouth every 6 (six) hours as needed for moderate pain.        Diagnostic Studies: Dg Knee Left Port  12/12/2014  CLINICAL DATA:  Left knee postoperative films EXAM: PORTABLE LEFT KNEE - 1-2 VIEW COMPARISON:  10/05/2014 FINDINGS: Orthopedic hardware is seen in the tibia and femoral aspects of the medial compartment. Postoperative air in fluid in the patellofemoral joint. IMPRESSION: Postoperative change Electronically Signed   By: Skipper Cliche M.D.   On: 12/12/2014 11:00    Disposition: 01-Home or Self Care Pt is stable and ready for discharge.  Pt is POD1 left partial knee replacement of the medial compartment.  Pt's IV and Foley were d/c on POD1, pt is tolerating po intake with no nausea and is urinating without a foley.  He has had a BM and his pain is under control with po medication.  He was able to ambulate 220 feet with PT.  He is doing well and is ready for d/c home with home health PT following PT session today.  Will discharge him with tramadol, oxycodone and lovenox for DVT prophylaxis.  He will follow-up with [facility] in 10-14 days for staple removal.      Follow-up Information    Follow up with  [facility] .   Specialty:  [place] information:   [address] [place] [phone]      Follow up with Judson Roch, PA-C In 14 days.   Specialty:  Physician Assistant   Why:  For staple removal, For wound re-check   Contact  information:   [place] [address] [phone]      Signed: Judson Roch PA-C 12/14/2014, 8:27 AM

## 2014-12-14 NOTE — Care Management (Signed)
Patient discharging home today followed by [place]. I have notified Jerry/Tim with [facility] of patient discharge. No further RNCM needs. Case closed.

## 2014-12-14 NOTE — Progress Notes (Signed)
  Subjective: 2 Days Post-Op Procedure(s) (LRB): UNICOMPARTMENTAL KNEE (Left) Patient reports pain as mild.   Patient is well, and has had no acute complaints or problems Plan is to go home with homehealth PT after hospital stay. Negative for chest pain and shortness of breath Fever: no Gastrointestinal:Negative for nausea and vomiting  Objective: Vital signs in last 24 hours: Temp:  [98.3 F (36.8 C)-100.5 F (38.1 C)] 99.1 F (37.3 C) (10/27 0400) Pulse Rate:  [103-115] 103 (10/27 0400) Resp:  [18] 18 (10/27 0400) BP: (122-155)/(64-82) 138/82 mmHg (10/27 0819) SpO2:  [93 %-95 %] 95 % (10/27 0400)  Intake/Output from previous day:  Intake/Output Summary (Last 24 hours) at 12/14/14 0820 Last data filed at 12/14/14 0400  Gross per 24 hour  Intake  716.5 ml  Output   1725 ml  Net -1008.5 ml    Intake/Output this shift:    Labs:  Recent Labs  12/13/14 0406 12/14/14 0409  HGB 12.0* 12.5*    Recent Labs  12/13/14 0406 12/14/14 0409  WBC 9.2 11.1*  RBC 3.48* 3.69*  HCT 35.0* 37.1*  PLT 105* 115*    Recent Labs  12/13/14 0406 12/14/14 0409  NA 137 134*  K 4.2 4.5  CL 107 103  CO2 24 23  BUN 14 38*  CREATININE 1.07 1.16  GLUCOSE 125* 168*  CALCIUM 8.1* 8.5*   No results for input(s): LABPT, INR in the last 72 hours.   EXAM General - Patient is Alert, Appropriate and Oriented Extremity - ABD soft Neurovascular intact Dorsiflexion/Plantar flexion intact Incision: scant drainage No cellulitis present Dressing/Incision - blood tinged drainage Motor Function - intact, moving foot and toes well on exam.   Abdomen soft on exam, with normal BS.  Past Medical History  Diagnosis Date  . Hypertension   . GERD (gastroesophageal reflux disease)   . Elevated lipids   . Tremors of nervous system   . Complication of anesthesia     bradycardia, in ICU for 24 hour after galbladder surgery.   . Cancer [facility])     prostate   . Diverticulosis 2012  .  Dysrhythmia     Assessment/Plan: 2 Days Post-Op Procedure(s) (LRB): UNICOMPARTMENTAL KNEE (Left) Active Problems:   Status post left partial knee replacement  Estimated body mass index is 26.16 kg/(m^2) as calculated from the following:   Height as of this encounter: 5\' 8"  (1.727 m).   Weight as of this encounter: 78.019 kg (172 lb). Advance diet Up with therapy Discharge home with home health   Temp 99.4.  Encouraged incentive spirometer. Pt has had a BM and is urinating without the foley. Ambulated 220 feet yesterday with PT with the assistance of a walker. Lovenox has been called into the pharmacy.  Will discharge with tramadol and oxycodone as needed for pain control. Will follow-up in the office in 2 weeks for staple removal. Plan to d/c home today with homehealth.   DVT Prophylaxis - Lovenox, Foot Pumps and TED hose Weight-Bearing as tolerated to left leg  J. Cameron Proud, PA-C [facility] Orthopaedic Surgery 12/14/2014, 8:20 AM

## 2014-12-14 NOTE — Care Management Important Message (Signed)
Important Message  Patient Details  Name: Allen Chang MRN: [id number] Date of Birth: 1932/09/29   Medicare Important Message Given:  Yes-second notification given    Marshell Garfinkel, RN 12/14/2014, 8:04 AM

## 2014-12-14 NOTE — Progress Notes (Signed)
DISCHARGE INSTRUCTIONS:  Pts VSS, discharge instructions and prescription given to pt and wife at the bedside. Pt verbalized understanding. Pt wheeled to car.

## 2015-08-09 ENCOUNTER — Emergency Department
Admission: EM | Admit: 2015-08-09 | Discharge: 2015-08-09 | Disposition: A | Discharge status: Home / Self Care | Payer: Medicare Other | Attending: Emergency Medicine | Admitting: Emergency Medicine

## 2015-08-09 ENCOUNTER — Emergency Department: Payer: Medicare Other

## 2015-08-09 DIAGNOSIS — I10 Essential (primary) hypertension: Secondary | ICD-10-CM | POA: Insufficient documentation

## 2015-08-09 DIAGNOSIS — Z7982 Long term (current) use of aspirin: Secondary | ICD-10-CM | POA: Diagnosis not present

## 2015-08-09 DIAGNOSIS — K5732 Diverticulitis of large intestine without perforation or abscess without bleeding: Secondary | ICD-10-CM | POA: Insufficient documentation

## 2015-08-09 DIAGNOSIS — Z8546 Personal history of malignant neoplasm of prostate: Secondary | ICD-10-CM | POA: Diagnosis not present

## 2015-08-09 DIAGNOSIS — Z79899 Other long term (current) drug therapy: Secondary | ICD-10-CM | POA: Diagnosis not present

## 2015-08-09 DIAGNOSIS — R1012 Left upper quadrant pain: Secondary | ICD-10-CM | POA: Diagnosis present

## 2015-08-09 LAB — URINALYSIS COMPLETE WITH MICROSCOPIC (ARMC ONLY)
BACTERIA UA: NONE SEEN
BILIRUBIN URINE: NEGATIVE
GLUCOSE, UA: NEGATIVE mg/dL
HGB URINE DIPSTICK: NEGATIVE
Ketones, ur: NEGATIVE mg/dL
Leukocytes, UA: NEGATIVE
Nitrite: NEGATIVE
PH: 5 (ref 5.0–8.0)
Protein, ur: NEGATIVE mg/dL
RBC / HPF: NONE SEEN RBC/hpf (ref 0–5)
Specific Gravity, Urine: 1.018 (ref 1.005–1.030)

## 2015-08-09 LAB — COMPREHENSIVE METABOLIC PANEL
ALBUMIN: 4.1 g/dL (ref 3.5–5.0)
ALT: 15 U/L — AB (ref 17–63)
AST: 21 U/L (ref 15–41)
Alkaline Phosphatase: 81 U/L (ref 38–126)
Anion gap: 7 (ref 5–15)
BUN: 23 mg/dL — AB (ref 6–20)
CHLORIDE: 103 mmol/L (ref 101–111)
CO2: 26 mmol/L (ref 22–32)
CREATININE: 1.38 mg/dL — AB (ref 0.61–1.24)
Calcium: 9 mg/dL (ref 8.9–10.3)
GFR calc Af Amer: 54 mL/min — ABNORMAL LOW (ref >60)
GFR calc non Af Amer: 46 mL/min — ABNORMAL LOW (ref >60)
Glucose, Bld: 75 mg/dL (ref 65–99)
POTASSIUM: 4.6 mmol/L (ref 3.5–5.1)
SODIUM: 136 mmol/L (ref 135–145)
Total Bilirubin: 0.6 mg/dL (ref 0.3–1.2)
Total Protein: 7.3 g/dL (ref 6.5–8.1)

## 2015-08-09 LAB — CBC
HEMATOCRIT: 36.3 % — AB (ref 40.0–52.0)
Hemoglobin: 12.5 g/dL — ABNORMAL LOW (ref 13.0–18.0)
MCH: 32.9 pg (ref 26.0–34.0)
MCHC: 34.5 g/dL (ref 32.0–36.0)
MCV: 95.3 fL (ref 80.0–100.0)
PLATELETS: 135 10*3/uL — AB (ref 150–440)
RBC: 3.81 MIL/uL — ABNORMAL LOW (ref 4.40–5.90)
RDW: 15.2 % — AB (ref 11.5–14.5)
WBC: 10.6 10*3/uL (ref 3.8–10.6)

## 2015-08-09 LAB — LIPASE, BLOOD: LIPASE: 15 U/L (ref 11–51)

## 2015-08-09 MED ORDER — METRONIDAZOLE 500 MG PO TABS
500.0000 mg | ORAL_TABLET | Freq: Once | ORAL | Status: AC
  Administered 2015-08-09: 500 mg via ORAL

## 2015-08-09 MED ORDER — METRONIDAZOLE 500 MG PO TABS: 500.0000 mg | ORAL_TABLET | Freq: Three times a day (TID) | ORAL | Status: AC

## 2015-08-09 MED ORDER — CIPROFLOXACIN HCL 500 MG PO TABS: 500.0000 mg | ORAL_TABLET | Freq: Two times a day (BID) | ORAL | Status: AC

## 2015-08-09 MED ORDER — CIPROFLOXACIN HCL 500 MG PO TABS
ORAL_TABLET | ORAL | Status: AC
  Filled 2015-08-09: qty 1

## 2015-08-09 MED ORDER — CIPROFLOXACIN HCL 500 MG PO TABS
500.0000 mg | ORAL_TABLET | Freq: Once | ORAL | Status: AC
  Administered 2015-08-09: 500 mg via ORAL

## 2015-08-09 MED ORDER — SODIUM CHLORIDE 0.9 % IV BOLUS (SEPSIS)
1000.0000 mL | Freq: Once | INTRAVENOUS | Status: AC
  Administered 2015-08-09: 1000 mL via INTRAVENOUS

## 2015-08-09 MED ORDER — TRAMADOL HCL 50 MG PO TABS
100.0000 mg | ORAL_TABLET | Freq: Once | ORAL | Status: AC
  Administered 2015-08-09: 100 mg via ORAL

## 2015-08-09 MED ORDER — TRAMADOL HCL 50 MG PO TABS
ORAL_TABLET | ORAL | Status: AC
  Filled 2015-08-09: qty 2

## 2015-08-09 MED ORDER — METRONIDAZOLE 500 MG PO TABS
ORAL_TABLET | ORAL | Status: AC
  Filled 2015-08-09: qty 1

## 2015-08-09 MED ORDER — DIATRIZOATE MEGLUMINE & SODIUM 66-10 % PO SOLN
15.0000 mL | Freq: Once | ORAL | Status: AC
  Administered 2015-08-09: 15 mL via ORAL

## 2015-08-09 MED ORDER — IOPAMIDOL (ISOVUE-300) INJECTION 61%
75.0000 mL | Freq: Once | INTRAVENOUS | Status: AC | PRN
  Administered 2015-08-09: 75 mL via INTRAVENOUS

## 2015-08-09 MED ORDER — TRAMADOL HCL 50 MG PO TABS: 50.0000 mg | ORAL_TABLET | Freq: Four times a day (QID) | ORAL | Status: DC | PRN

## 2015-08-09 NOTE — ED Notes (Signed)
MD Paduchowski at bedside  

## 2015-08-09 NOTE — ED Notes (Signed)
Patient transported to CT 

## 2015-08-09 NOTE — Discharge Instructions (Signed)
Diverticulitis °Diverticulitis is inflammation or infection of small pouches in your colon that form when you have a condition called diverticulosis. The pouches in your colon are called diverticula. Your colon, or large intestine, is where water is absorbed and stool is formed. °Complications of diverticulitis can include: °· Bleeding. °· Severe infection. °· Severe pain. °· Perforation of your colon. °· Obstruction of your colon. °CAUSES  °Diverticulitis is caused by bacteria. °Diverticulitis happens when stool becomes trapped in diverticula. This allows bacteria to grow in the diverticula, which can lead to inflammation and infection. °RISK FACTORS °People with diverticulosis are at risk for diverticulitis. Eating a diet that does not include enough fiber from fruits and vegetables may make diverticulitis more likely to develop. °SYMPTOMS  °Symptoms of diverticulitis may include: °· Abdominal pain and tenderness. The pain is normally located on the left side of the abdomen, but may occur in other areas. °· Fever and chills. °· Bloating. °· Cramping. °· Nausea. °· Vomiting. °· Constipation. °· Diarrhea. °· Blood in your stool. °DIAGNOSIS  °Your health care provider will ask you about your medical history and do a physical exam. You may need to have tests done because many medical conditions can cause the same symptoms as diverticulitis. Tests may include: °· Blood tests. °· Urine tests. °· Imaging tests of the abdomen, including X-rays and CT scans. °When your condition is under control, your health care provider may recommend that you have a colonoscopy. A colonoscopy can show how severe your diverticula are and whether something else is causing your symptoms. °TREATMENT  °Most cases of diverticulitis are mild and can be treated at home. Treatment may include: °· Taking over-the-counter pain medicines. °· Following a clear liquid diet. °· Taking antibiotic medicines by mouth for 7-10 days. °More severe cases may  be treated at a hospital. Treatment may include: °· Not eating or drinking. °· Taking prescription pain medicine. °· Receiving antibiotic medicines through an IV tube. °· Receiving fluids and nutrition through an IV tube. °· Surgery. °HOME CARE INSTRUCTIONS  °· Follow your health care provider's instructions carefully. °· Follow a full liquid diet or other diet as directed by your health care provider. After your symptoms improve, your health care provider may tell you to change your diet. He or she may recommend you eat a high-fiber diet. Fruits and vegetables are good sources of fiber. Fiber makes it easier to pass stool. °· Take fiber supplements or probiotics as directed by your health care provider. °· Only take medicines as directed by your health care provider. °· Keep all your follow-up appointments. °SEEK MEDICAL CARE IF:  °· Your pain does not improve. °· You have a hard time eating food. °· Your bowel movements do not return to normal. °SEEK IMMEDIATE MEDICAL CARE IF:  °· Your pain becomes worse. °· Your symptoms do not get better. °· Your symptoms suddenly get worse. °· You have a fever. °· You have repeated vomiting. °· You have bloody or black, tarry stools. °MAKE SURE YOU:  °· Understand these instructions. °· Will watch your condition. °· Will get help right away if you are not doing well or get worse. °  °This information is not intended to replace advice given to you by your health care provider. Make sure you discuss any questions you have with your health care provider. °  °Document Released: 11/13/2004 Document Revised: 02/08/2013 Document Reviewed: 12/29/2012 °Elsevier Interactive Patient Education ©2016 [employer] Inc. ° °

## 2015-08-09 NOTE — ED Notes (Signed)
Pt arrives to ER via POV c/o lower left sided abdominal pain X 2 day. Pt hx of diverticulitis. Pt states he is experiencing constipation at this time. Denies NVD. Pt alert and oriented X4, active, cooperative, pt in NAD. RR even and unlabored, color WNL.

## 2015-08-09 NOTE — ED Provider Notes (Signed)
[facility] Emergency Department Provider Note  Time seen: 7:04 PM  I have reviewed the triage vital signs and the nursing notes.   HISTORY  Chief Complaint Abdominal Pain    HPI Allen Chang is a 82 y.o. male with a past medical history of hypertension, gastric reflux, diverticulitis 6 years ago, who presents the emergency department left-sided abdominal pain. According to the patient since yesterday he has been experiencing left upper quadrant pain. Denies diarrhea. States a small bowel movement this morning. Denies nausea or vomiting. Does state decreased appetite. Denies fever. Denies dysuria. Describes his abdominal pain is moderate 6/10 dull aching sensation in the left abdomen.  Past Medical History  Diagnosis Date  . Hypertension   . GERD (gastroesophageal reflux disease)   . Elevated lipids   . Tremors of nervous system   . Complication of anesthesia     bradycardia, in ICU for 24 hour after galbladder surgery.   . Cancer [facility])     prostate   . Diverticulosis 2012  . Dysrhythmia     Patient Active Problem List   Diagnosis Date Noted  . Status post left partial knee replacement 12/12/2014  . Bradycardia 10/10/2014  . Severe sinus bradycardia 10/10/2014    Past Surgical History  Procedure Laterality Date  . Cardiac catheterization    . Hemorrhoid surgery    . Hernia repair Left     x2  . Bladder tumor excision    . Shoulder arthroscopy Right   . Prostate seeding    . Cholecystectomy N/A 10/10/2014    Procedure: LAPAROSCOPIC CHOLECYSTECTOMY WITH INTRAOPERATIVE CHOLANGIOGRAM;  Surgeon: Leonie Green, MD;  Location: [facility] ORS;  Service: General;  Laterality: N/A;  . Partial knee arthroplasty Left 12/12/2014    Procedure: UNICOMPARTMENTAL KNEE;  Surgeon: Corky Mull, MD;  Location: [facility] ORS;  Service: Orthopedics;  Laterality: Left;    Current Outpatient Rx  Name  Route  Sig  Dispense  Refill  . aspirin 81 MG tablet   Oral  Take 81 mg by mouth daily. In am         . docusate sodium (COLACE) 100 MG capsule   Oral   Take 200 mg by mouth daily.          . Flaxseed, Linseed, (FLAX SEED OIL) 1000 MG CAPS   Oral   Take 1 capsule by mouth daily.         . Garlic 123XX123 MG CAPS   Oral   Take 1 capsule by mouth every morning.         Marland Kitchen glucosamine-chondroitin 500-400 MG tablet   Oral   Take 1 tablet by mouth 2 (two) times daily. Glucosamine 1500 mg and Chondrotoin 1200 mg         . lisinopril (PRINIVIL,ZESTRIL) 20 MG tablet   Oral   Take 10 mg by mouth daily. In am         . loratadine (CLARITIN REDITABS) 10 MG dissolvable tablet   Oral   Take 10 mg by mouth daily. In am         . Misc Natural Products (BLACK CHERRY CONCENTRATE) LIQD   Oral   Take 2 Doses by mouth daily.         . Multiple Vitamins-Minerals (MULTIVITAMIN WITH MINERALS) tablet   Oral   Take 1 tablet by mouth daily. In am         . Omega-3 Fatty Acids (FISH OIL) 1000 MG CAPS  Oral   Take 1 capsule by mouth daily.         Marland Kitchen omeprazole (PRILOSEC) 20 MG capsule   Oral   Take 20 mg by mouth 2 (two) times daily before a meal.          . oxyCODONE (OXY IR/ROXICODONE) 5 MG immediate release tablet   Oral   Take 1-2 tablets (5-10 mg total) by mouth every 4 (four) hours as needed for breakthrough pain.   60 tablet   0   . primidone (MYSOLINE) 50 MG tablet   Oral   Take 50 mg by mouth 2 (two) times daily.         . traMADol (ULTRAM) 50 MG tablet   Oral   Take 1-2 tablets (50-100 mg total) by mouth every 6 (six) hours as needed for moderate pain.   40 tablet   0     Allergies Percocet and Voltaren  Family History  Problem Relation Age of Onset  . Bone cancer Father   . Hypertension Mother   . Osteoporosis Mother     Social History Social History  Substance Use Topics  . Smoking status: Never Smoker   . Smokeless tobacco: Never Used  . Alcohol Use: No    Review of Systems Constitutional:  Negative for fever. Cardiovascular: Negative for chest pain. Respiratory: Negative for shortness of breath. Gastrointestinal: Positive for left-sided abdominal pain. Musculoskeletal: Negative for back pain. Neurological: Negative for headache 10-point ROS otherwise negative.  ____________________________________________   PHYSICAL EXAM:  VITAL SIGNS: ED Triage Vitals  Enc Vitals Group     BP 08/09/15 1639 120/66 mmHg     Pulse Rate 08/09/15 1639 64     Resp 08/09/15 1639 20     Temp 08/09/15 1639 98.3 F (36.8 C)     Temp Source 08/09/15 1639 Oral     SpO2 08/09/15 1639 99 %     Weight 08/09/15 1639 180 lb (81.647 kg)     Height --      Head Cir --      Peak Flow --      Pain Score 08/09/15 1640 5     Pain Loc --      Pain Edu? --      Excl. in [place]? --     Constitutional: Alert and oriented. Well appearing and in no distress. Eyes: Normal exam ENT   Head: Normocephalic and atraumatic.   Mouth/Throat: Mucous membranes are moist. Cardiovascular: Normal rate, regular rhythm. No murmur Respiratory: Normal respiratory effort without tachypnea nor retractions. Breath sounds are clear Gastrointestinal: Soft, moderate left upper quadrant tenderness palpation. No rebound or guarding. Musculoskeletal: Nontender with normal range of motion in all extremities.  Neurologic:  Normal speech and language. No gross focal neurologic deficits  Skin:  Skin is warm, dry and intact.  Psychiatric: Mood and affect are normal.   ____________________________________________   RADIOLOGY  CT consistent with sigmoid diverticulitis.  ____________________________________________   INITIAL IMPRESSION / ASSESSMENT AND PLAN / ED COURSE  Pertinent labs & imaging results that were available during my care of the patient were reviewed by me and considered in my medical decision making (see chart for details).  CT consistent with uncomplicated sigmoid diverticulitis. Patient states mild  to moderate discomfort. We'll dose Ultram, Cipro and Flagyl. Labs are largely within normal limits including normal white blood cell count. We'll discharge to 2 weeks of ciprofloxacin, Flagyl and Ultram as needed. Patient agreeable to plan.  ____________________________________________  FINAL CLINICAL IMPRESSION(S) / ED DIAGNOSES  Sigmoid diverticulitis   Harvest Dark, MD 08/09/15 [phone]

## 2015-08-09 NOTE — ED Notes (Signed)
  Reviewed d/c instructions, follow-up care, and prescriptions with pt. Pt verbalized understanding 

## 2016-01-29 ENCOUNTER — Encounter
Admission: RE | Admit: 2016-01-29 | Discharge: 2016-01-29 | Disposition: A | Discharge status: Home / Self Care | Payer: Medicare Other | Source: Ambulatory Visit | Attending: Surgery | Admitting: Surgery

## 2016-01-29 DIAGNOSIS — Z01818 Encounter for other preprocedural examination: Secondary | ICD-10-CM | POA: Insufficient documentation

## 2016-01-29 HISTORY — DX: Personal history of other diseases of the digestive system: Z87.19

## 2016-01-29 LAB — CBC
HCT: 36.5 % — ABNORMAL LOW (ref 40.0–52.0)
Hemoglobin: 12.4 g/dL — ABNORMAL LOW (ref 13.0–18.0)
MCH: 34 pg (ref 26.0–34.0)
MCHC: 34.1 g/dL (ref 32.0–36.0)
MCV: 99.8 fL (ref 80.0–100.0)
PLATELETS: 131 10*3/uL — AB (ref 150–440)
RBC: 3.66 MIL/uL — ABNORMAL LOW (ref 4.40–5.90)
RDW: 13.7 % (ref 11.5–14.5)
WBC: 11.2 10*3/uL — ABNORMAL HIGH (ref 3.8–10.6)

## 2016-01-29 LAB — DIFFERENTIAL
BASOS PCT: 0 %
Basophils Absolute: 0 10*3/uL (ref 0–0.1)
EOS ABS: 0.1 10*3/uL (ref 0–0.7)
EOS PCT: 1 %
Lymphocytes Relative: 60 %
Lymphs Abs: 6.7 10*3/uL — ABNORMAL HIGH (ref 1.0–3.6)
MONO ABS: 0.6 10*3/uL (ref 0.2–1.0)
Monocytes Relative: 5 %
NEUTROS ABS: 3.8 10*3/uL (ref 1.4–6.5)
Neutrophils Relative %: 34 %

## 2016-01-29 NOTE — Patient Instructions (Signed)
  Your procedure is scheduled on: February 07, 2016 (Thursday) Report to Same Day Surgery 2nd floor medical mall [facility] Entrance-take elevator on left to 2nd floor.  Check in with surgery information desk.) To find out your arrival time please call [phone] between 1PM - 3PM on February 06, 2016 (Wednesday)  Remember: Instructions that are not followed completely may result in serious medical risk, up to and including death, or upon the discretion of your surgeon and anesthesiologist your surgery may need to be rescheduled.    _x___ 1. Do not eat food or drink liquids after midnight. No gum chewing or hard candies.     __x__ 2. No Alcohol for 24 hours before or after surgery.   __x__3. No Smoking for 24 prior to surgery.   ____  4. Bring all medications with you on the day of surgery if instructed.    __x__ 5. Notify your doctor if there is any change in your medical condition     (cold, fever, infections).     Do not wear jewelry, make-up, hairpins, clips or nail polish.  Do not wear lotions, powders, or perfumes. You may wear deodorant.  Do not shave 48 hours prior to surgery. Men may shave face and neck.  Do not bring valuables to the hospital.    [facility] is not responsible for any belongings or valuables.               Contacts, dentures or bridgework may not be worn into surgery.  Leave your suitcase in the car. After surgery it may be brought to your room.  For patients admitted to the hospital, discharge time is determined by your treatment team.   Patients discharged the day of surgery will not be allowed to drive home.  You will need someone to drive you home and stay with you the night of your procedure.    Please read over the following fact sheets that you were given:   [facility] Preparing for Surgery and or MRSA Information   _x___ Take these medicines the morning of surgery with A SIP OF WATER:    1. Lisinopril  2. Omeprazole (Omeprazole at bedtime  Wednesday night)  3. Primidone  4.  5.  6.  ____Fleets enema or Magnesium Citrate as directed.   _x___ Use CHG Soap or sage wipes as directed on instruction sheet   ____ Use inhalers on the day of surgery and bring to hospital day of surgery  ____ Stop metformin 2 days prior to surgery    ____ Take 1/2 of usual insulin dose the night before surgery and none on the morning of           surgery.   _x___ Stop Aspirin, Coumadin, Pllavix ,Eliquis, Effient, or Pradaxa (Ask Dr. Roland Rack when to stop Aspirin)  x__ Stop Anti-inflammatories such as Advil, Aleve, Ibuprofen, Motrin, Naproxen,          Naprosyn, Goodies powders or aspirin products. Ok to take Tylenol. (Stop Aleve now)   __x__ Stop supplements until after surgery.  (Stop Omega-3, Garlic, Flaxseed and [place] now)  ____ Bring C-Pap to the hospital.

## 2016-02-06 MED ORDER — BUPIVACAINE HCL (PF) 0.5 % IJ SOLN
INTRAMUSCULAR | Status: AC
Start: 2016-02-06 — End: 2016-02-06
  Filled 2016-02-06: qty 30

## 2016-02-07 ENCOUNTER — Encounter
Admission: RE | Disposition: A | Discharge status: Home / Self Care | Payer: Self-pay | Source: Ambulatory Visit | Attending: Surgery

## 2016-02-07 ENCOUNTER — Ambulatory Visit
Admission: RE | Admit: 2016-02-07 | Discharge: 2016-02-07 | Disposition: A | Discharge status: Home / Self Care | Payer: Medicare Other | Source: Ambulatory Visit | Attending: Surgery | Admitting: Surgery

## 2016-02-07 ENCOUNTER — Ambulatory Visit: Payer: Medicare Other | Admitting: Anesthesiology

## 2016-02-07 ENCOUNTER — Encounter: Payer: Self-pay | Admitting: *Deleted

## 2016-02-07 DIAGNOSIS — Z888 Allergy status to other drugs, medicaments and biological substances status: Secondary | ICD-10-CM | POA: Diagnosis not present

## 2016-02-07 DIAGNOSIS — K449 Diaphragmatic hernia without obstruction or gangrene: Secondary | ICD-10-CM | POA: Diagnosis not present

## 2016-02-07 DIAGNOSIS — Z85828 Personal history of other malignant neoplasm of skin: Secondary | ICD-10-CM | POA: Insufficient documentation

## 2016-02-07 DIAGNOSIS — M7542 Impingement syndrome of left shoulder: Secondary | ICD-10-CM | POA: Diagnosis not present

## 2016-02-07 DIAGNOSIS — E785 Hyperlipidemia, unspecified: Secondary | ICD-10-CM | POA: Diagnosis not present

## 2016-02-07 DIAGNOSIS — Z8546 Personal history of malignant neoplasm of prostate: Secondary | ICD-10-CM | POA: Diagnosis not present

## 2016-02-07 DIAGNOSIS — Z885 Allergy status to narcotic agent status: Secondary | ICD-10-CM | POA: Insufficient documentation

## 2016-02-07 DIAGNOSIS — M19012 Primary osteoarthritis, left shoulder: Secondary | ICD-10-CM | POA: Insufficient documentation

## 2016-02-07 DIAGNOSIS — M75122 Complete rotator cuff tear or rupture of left shoulder, not specified as traumatic: Secondary | ICD-10-CM | POA: Diagnosis not present

## 2016-02-07 DIAGNOSIS — I129 Hypertensive chronic kidney disease with stage 1 through stage 4 chronic kidney disease, or unspecified chronic kidney disease: Secondary | ICD-10-CM | POA: Diagnosis not present

## 2016-02-07 DIAGNOSIS — S43432A Superior glenoid labrum lesion of left shoulder, initial encounter: Secondary | ICD-10-CM | POA: Insufficient documentation

## 2016-02-07 DIAGNOSIS — J45909 Unspecified asthma, uncomplicated: Secondary | ICD-10-CM | POA: Diagnosis not present

## 2016-02-07 DIAGNOSIS — Z7982 Long term (current) use of aspirin: Secondary | ICD-10-CM | POA: Insufficient documentation

## 2016-02-07 DIAGNOSIS — X58XXXA Exposure to other specified factors, initial encounter: Secondary | ICD-10-CM | POA: Insufficient documentation

## 2016-02-07 DIAGNOSIS — K219 Gastro-esophageal reflux disease without esophagitis: Secondary | ICD-10-CM | POA: Diagnosis not present

## 2016-02-07 DIAGNOSIS — N183 Chronic kidney disease, stage 3 (moderate): Secondary | ICD-10-CM | POA: Insufficient documentation

## 2016-02-07 HISTORY — PX: SHOULDER ARTHROSCOPY WITH OPEN ROTATOR CUFF REPAIR: SHX6092

## 2016-02-07 SURGERY — ARTHROSCOPY, SHOULDER WITH REPAIR, ROTATOR CUFF, OPEN
Anesthesia: General | Laterality: Left | Wound class: Clean

## 2016-02-07 MED ORDER — FENTANYL CITRATE (PF) 100 MCG/2ML IJ SOLN: 25.0000 ug | INTRAMUSCULAR | Status: DC | PRN

## 2016-02-07 MED ORDER — BUPIVACAINE HCL (PF) 0.5 % IJ SOLN
INTRAMUSCULAR | Status: AC
  Filled 2016-02-07: qty 30

## 2016-02-07 MED ORDER — SUGAMMADEX SODIUM 200 MG/2ML IV SOLN
INTRAVENOUS | Status: AC
  Filled 2016-02-07: qty 2

## 2016-02-07 MED ORDER — PROPOFOL 10 MG/ML IV BOLUS
INTRAVENOUS | Status: AC
  Filled 2016-02-07: qty 20

## 2016-02-07 MED ORDER — OXYCODONE HCL 5 MG PO TABS: 5.0000 mg | ORAL_TABLET | ORAL | 0 refills | Status: DC | PRN

## 2016-02-07 MED ORDER — ONDANSETRON HCL 4 MG/2ML IJ SOLN
INTRAMUSCULAR | Status: DC | PRN
  Administered 2016-02-07: 4 mg via INTRAVENOUS

## 2016-02-07 MED ORDER — PROPOFOL 10 MG/ML IV BOLUS
INTRAVENOUS | Status: DC | PRN
  Administered 2016-02-07: 120 mg via INTRAVENOUS

## 2016-02-07 MED ORDER — MIDAZOLAM HCL 2 MG/2ML IJ SOLN
INTRAMUSCULAR | Status: AC
  Administered 2016-02-07: 1 mg via INTRAVENOUS
  Filled 2016-02-07: qty 2

## 2016-02-07 MED ORDER — BUPIVACAINE-EPINEPHRINE 0.5% -1:200000 IJ SOLN
INTRAMUSCULAR | Status: DC | PRN
  Administered 2016-02-07: 30 mL

## 2016-02-07 MED ORDER — LIDOCAINE HCL (PF) 1 % IJ SOLN
INTRAMUSCULAR | Status: AC
  Filled 2016-02-07: qty 5

## 2016-02-07 MED ORDER — OXYCODONE HCL 5 MG PO TABS: 5.0000 mg | ORAL_TABLET | ORAL | Status: DC | PRN

## 2016-02-07 MED ORDER — SUGAMMADEX SODIUM 200 MG/2ML IV SOLN
INTRAVENOUS | Status: DC | PRN
  Administered 2016-02-07: 50 mg via INTRAVENOUS

## 2016-02-07 MED ORDER — CEFAZOLIN SODIUM-DEXTROSE 2-4 GM/100ML-% IV SOLN
INTRAVENOUS | Status: AC
  Filled 2016-02-07: qty 100

## 2016-02-07 MED ORDER — GLYCOPYRROLATE 0.2 MG/ML IJ SOLN
INTRAMUSCULAR | Status: DC | PRN
  Administered 2016-02-07: 0.2 mg via INTRAVENOUS

## 2016-02-07 MED ORDER — EPINEPHRINE PF 1 MG/ML IJ SOLN
INTRAMUSCULAR | Status: DC | PRN
  Administered 2016-02-07: 1 mg

## 2016-02-07 MED ORDER — DEXAMETHASONE SODIUM PHOSPHATE 4 MG/ML IJ SOLN
INTRAMUSCULAR | Status: DC | PRN
  Administered 2016-02-07: 5 mg via INTRAVENOUS

## 2016-02-07 MED ORDER — ONDANSETRON HCL 4 MG/2ML IJ SOLN
INTRAMUSCULAR | Status: AC
  Filled 2016-02-07: qty 2

## 2016-02-07 MED ORDER — LIDOCAINE 2% (20 MG/ML) 5 ML SYRINGE
INTRAMUSCULAR | Status: AC
  Filled 2016-02-07: qty 5

## 2016-02-07 MED ORDER — LIDOCAINE HCL (PF) 2 % IJ SOLN
INTRAMUSCULAR | Status: DC | PRN
  Administered 2016-02-07: 3 mL via INTRADERMAL

## 2016-02-07 MED ORDER — EPHEDRINE SULFATE 50 MG/ML IJ SOLN
INTRAMUSCULAR | Status: DC | PRN
  Administered 2016-02-07 (×2): 5 mg via INTRAVENOUS

## 2016-02-07 MED ORDER — ROPIVACAINE HCL 5 MG/ML IJ SOLN
INTRAMUSCULAR | Status: DC | PRN
  Administered 2016-02-07: 30 mL via EPIDURAL

## 2016-02-07 MED ORDER — EPINEPHRINE PF 1 MG/ML IJ SOLN
INTRAMUSCULAR | Status: AC
  Filled 2016-02-07: qty 3

## 2016-02-07 MED ORDER — METOCLOPRAMIDE HCL 5 MG/ML IJ SOLN: 5.0000 mg | Freq: Three times a day (TID) | INTRAMUSCULAR | Status: DC | PRN

## 2016-02-07 MED ORDER — ROPIVACAINE HCL 5 MG/ML IJ SOLN
INTRAMUSCULAR | Status: AC
  Filled 2016-02-07: qty 40

## 2016-02-07 MED ORDER — ACETAMINOPHEN 10 MG/ML IV SOLN
INTRAVENOUS | Status: DC | PRN
  Administered 2016-02-07: 1000 mg via INTRAVENOUS

## 2016-02-07 MED ORDER — ONDANSETRON HCL 4 MG/2ML IJ SOLN: 4.0000 mg | Freq: Four times a day (QID) | INTRAMUSCULAR | Status: DC | PRN

## 2016-02-07 MED ORDER — ACETAMINOPHEN 10 MG/ML IV SOLN
INTRAVENOUS | Status: AC
  Filled 2016-02-07: qty 100

## 2016-02-07 MED ORDER — FENTANYL CITRATE (PF) 250 MCG/5ML IJ SOLN
INTRAMUSCULAR | Status: AC
  Filled 2016-02-07: qty 5

## 2016-02-07 MED ORDER — FENTANYL CITRATE (PF) 100 MCG/2ML IJ SOLN
INTRAMUSCULAR | Status: AC
  Filled 2016-02-07: qty 2

## 2016-02-07 MED ORDER — LIDOCAINE HCL (CARDIAC) 20 MG/ML IV SOLN
INTRAVENOUS | Status: DC | PRN
  Administered 2016-02-07: 100 mg via INTRAVENOUS

## 2016-02-07 MED ORDER — ROCURONIUM BROMIDE 100 MG/10ML IV SOLN
INTRAVENOUS | Status: DC | PRN
  Administered 2016-02-07: 50 mg via INTRAVENOUS

## 2016-02-07 MED ORDER — PHENYLEPHRINE HCL 10 MG/ML IJ SOLN
INTRAVENOUS | Status: DC | PRN
  Administered 2016-02-07: 30 ug/min via INTRAVENOUS

## 2016-02-07 MED ORDER — METOCLOPRAMIDE HCL 10 MG PO TABS: 5.0000 mg | ORAL_TABLET | Freq: Three times a day (TID) | ORAL | Status: DC | PRN

## 2016-02-07 MED ORDER — MIDAZOLAM HCL 2 MG/2ML IJ SOLN
1.0000 mg | Freq: Once | INTRAMUSCULAR | Status: AC
  Administered 2016-02-07: 1 mg via INTRAVENOUS

## 2016-02-07 MED ORDER — CEFAZOLIN SODIUM-DEXTROSE 2-4 GM/100ML-% IV SOLN
2.0000 g | Freq: Once | INTRAVENOUS | Status: AC
  Administered 2016-02-07: 2 g via INTRAVENOUS

## 2016-02-07 MED ORDER — FENTANYL CITRATE (PF) 100 MCG/2ML IJ SOLN
INTRAMUSCULAR | Status: DC | PRN
  Administered 2016-02-07: 100 ug via INTRAVENOUS
  Administered 2016-02-07: 250 ug via INTRAVENOUS

## 2016-02-07 MED ORDER — LACTATED RINGERS IV SOLN
INTRAVENOUS | Status: DC
  Administered 2016-02-07: 10:00:00 via INTRAVENOUS

## 2016-02-07 MED ORDER — ROCURONIUM BROMIDE 10 MG/ML (PF) SYRINGE
PREFILLED_SYRINGE | INTRAVENOUS | Status: AC
  Filled 2016-02-07: qty 10

## 2016-02-07 MED ORDER — ONDANSETRON HCL 4 MG PO TABS: 4.0000 mg | ORAL_TABLET | Freq: Four times a day (QID) | ORAL | Status: DC | PRN

## 2016-02-07 MED ORDER — ONDANSETRON HCL 4 MG/2ML IJ SOLN: 4.0000 mg | Freq: Once | INTRAMUSCULAR | Status: DC | PRN

## 2016-02-07 SURGICAL SUPPLY — 45 items
ANCHOR JUGGERKNOT WTAP NDL 2.9 (Anchor) ×12 IMPLANT
ANCHOR SUT QUATTRO KNTLS 4.5 (Anchor) ×3 IMPLANT
BIT DRILL JUGRKNT W/NDL BIT2.9 (DRILL) ×3 IMPLANT
BLADE FULL RADIUS 3.5 (BLADE) ×3 IMPLANT
BLADE SHAVER 4.5X7 STR FR (MISCELLANEOUS) ×3 IMPLANT
BUR ACROMIONIZER 4.0 (BURR) ×3 IMPLANT
BUR BR 5.5 WIDE MOUTH (BURR) ×3 IMPLANT
CANNULA SHAVER 8MMX76MM (CANNULA) ×3 IMPLANT
CHLORAPREP W/TINT 26ML (MISCELLANEOUS) ×6 IMPLANT
COVER MAYO STAND STRL (DRAPES) ×3 IMPLANT
DRAPE IMP U-DRAPE 54X76 (DRAPES) ×6 IMPLANT
DRILL JUGGERKNOT W/NDL BIT 2.9 (DRILL) ×9
DRSG OPSITE POSTOP 4X8 (GAUZE/BANDAGES/DRESSINGS) ×3 IMPLANT
ELECT REM PT RETURN 9FT ADLT (ELECTROSURGICAL) ×3
ELECTRODE REM PT RTRN 9FT ADLT (ELECTROSURGICAL) ×1 IMPLANT
GAUZE PETRO XEROFOAM 1X8 (MISCELLANEOUS) ×3 IMPLANT
GAUZE SPONGE 4X4 12PLY STRL (GAUZE/BANDAGES/DRESSINGS) ×3 IMPLANT
GLOVE BIO SURGEON STRL SZ7.5 (GLOVE) ×6 IMPLANT
GLOVE BIO SURGEON STRL SZ8 (GLOVE) ×6 IMPLANT
GLOVE BIOGEL PI IND STRL 8 (GLOVE) ×1 IMPLANT
GLOVE BIOGEL PI INDICATOR 8 (GLOVE) ×2
GLOVE INDICATOR 8.0 STRL GRN (GLOVE) ×3 IMPLANT
GOWN STRL REUS W/ TWL LRG LVL3 (GOWN DISPOSABLE) ×2 IMPLANT
GOWN STRL REUS W/ TWL XL LVL3 (GOWN DISPOSABLE) ×1 IMPLANT
GOWN STRL REUS W/TWL LRG LVL3 (GOWN DISPOSABLE) ×4
GOWN STRL REUS W/TWL XL LVL3 (GOWN DISPOSABLE) ×2
GRASPER SUT 15 45D LOW PRO (SUTURE) ×6 IMPLANT
IV LACTATED RINGER IRRG 3000ML (IV SOLUTION) ×4
IV LR IRRIG 3000ML ARTHROMATIC (IV SOLUTION) ×2 IMPLANT
MANIFOLD NEPTUNE II (INSTRUMENTS) ×3 IMPLANT
MASK FACE SPIDER DISP (MASK) ×3 IMPLANT
MAT BLUE FLOOR 46X72 FLO (MISCELLANEOUS) ×3 IMPLANT
NEEDLE REVERSE CUT 1/2 CRC (NEEDLE) ×3 IMPLANT
PACK ARTHROSCOPY SHOULDER (MISCELLANEOUS) ×3 IMPLANT
SLING ARM LRG DEEP (SOFTGOODS) ×3 IMPLANT
STAPLER SKIN PROX 35W (STAPLE) ×3 IMPLANT
STRAP SAFETY BODY (MISCELLANEOUS) ×3 IMPLANT
SUT ETHIBOND 0 MO6 C/R (SUTURE) ×3 IMPLANT
SUT VIC AB 2-0 CT1 27 (SUTURE) ×4
SUT VIC AB 2-0 CT1 TAPERPNT 27 (SUTURE) ×2 IMPLANT
TAPE MICROFOAM 4IN (TAPE) ×3 IMPLANT
TUBING ARTHRO INFLOW-ONLY STRL (TUBING) ×3 IMPLANT
TUBING CONNECTING 10 (TUBING) ×2 IMPLANT
TUBING CONNECTING 10' (TUBING) ×1
WAND HAND CNTRL MULTIVAC 90 (MISCELLANEOUS) ×3 IMPLANT

## 2016-02-07 NOTE — Anesthesia Preprocedure Evaluation (Signed)
Anesthesia Evaluation  Patient identified by MRN, date of birth, ID band Patient awake    Reviewed: Allergy & Precautions, NPO status , Patient's Chart, lab work & pertinent test results  History of Anesthesia Complications (+) history of anesthetic complications  Airway Mallampati: II  TM Distance: >3 FB     Dental  (+) Chipped   Pulmonary neg pulmonary ROS,    Pulmonary exam normal        Cardiovascular hypertension, Pt. on medications Normal cardiovascular exam+ dysrhythmias      Neuro/Psych negative neurological ROS  negative psych ROS   GI/Hepatic Neg liver ROS, hiatal hernia, GERD  Medicated and Controlled,  Endo/Other  negative endocrine ROS  Renal/GU negative Renal ROS  negative genitourinary   Musculoskeletal  (+) Arthritis , Osteoarthritis,    Abdominal Normal abdominal exam  (+)   Peds negative pediatric ROS (+)  Hematology negative hematology ROS (+)   Anesthesia Other Findings   Reproductive/Obstetrics                             Anesthesia Physical Anesthesia Plan  ASA: III  Anesthesia Plan: General   Post-op Pain Management:  Regional for Post-op pain   Induction: Intravenous  Airway Management Planned: Oral ETT  Additional Equipment:   Intra-op Plan:   Post-operative Plan: Extubation in OR  Informed Consent: I have reviewed the patients History and Physical, chart, labs and discussed the procedure including the risks, benefits and alternatives for the proposed anesthesia with the patient or authorized representative who has indicated his/her understanding and acceptance.   Dental advisory given  Plan Discussed with: CRNA and Surgeon  Anesthesia Plan Comments:         Anesthesia Quick Evaluation

## 2016-02-07 NOTE — Transfer of Care (Signed)
Immediate Anesthesia Transfer of Care Note  Patient: Allen Chang  Procedure(s) Performed: Procedure(s): SHOULDER ARTHROSCOPY WITH OPEN ROTATOR CUFF REPAIR (Left)  Patient Location: PACU  Anesthesia Type:General  Level of Consciousness: awake, alert , oriented and patient cooperative  Airway & Oxygen Therapy: Patient Spontanous Breathing and Patient connected to nasal cannula oxygen  Post-op Assessment: Report given to RN and Post -op Vital signs reviewed and stable  Post vital signs: Reviewed and stable  Last Vitals:  Vitals:   02/07/16 1115 02/07/16 1130  BP:    Pulse: (!) 50 (!) 47  Resp: 16 15  Temp:      Last Pain:  Vitals:   02/07/16 1100  TempSrc:   PainSc: 0-No pain         Complications: No apparent anesthesia complications

## 2016-02-07 NOTE — Anesthesia Procedure Notes (Signed)
Anesthesia Regional Block:  Interscalene brachial plexus block  Pre-Anesthetic Checklist: ,, timeout performed, Correct Patient, Correct Site, Correct Laterality, Correct Procedure, Correct Position, site marked, Risks and benefits discussed,  Surgical consent,  Pre-op evaluation,  At surgeon's request and post-op pain management  Laterality: Left  Prep: Maximum Sterile Barrier Precautions used, chloraprep, alcohol swabs       Needles:  Injection technique: Single-shot  Needle Type: Stimiplex     Needle Length: 9cm 9 cm Needle Gauge: 20 and 20 G    Additional Needles:  Procedures: ultrasound guided (picture in chart) and nerve stimulator Interscalene brachial plexus block  Nerve Stimulator or Paresthesia:  Response: biceps flexion, 0.48 mA,   Additional Responses:   Narrative:  Start time: 02/07/2016 10:40 AM End time: 02/07/2016 10:50 AM Injection made incrementally with aspirations every 5 mL.  Performed by: Personally   Additional Notes: Functioning IV was confirmed and monitors were applied.  A 50 mm 22ga Stimuplex needle was used. Sterile prep and drape,hand hygiene and sterile gloves were used.  Negative aspiration and negative test dose prior to incremental administration of local anesthetic. The patient tolerated the procedure well.  Easy injection of 30 cc of 0.5% Ropivacaine.  Prior skin wheal with 2% Lidocaine plain.  Sterile [place] jelly used.

## 2016-02-07 NOTE — Discharge Instructions (Addendum)
AMBULATORY SURGERY  DISCHARGE INSTRUCTIONS   1) The drugs that you were given will stay in your system until tomorrow so for the next 24 hours you should not:  A) Drive an automobile B) Make any legal decisions C) Drink any alcoholic beverage   2) You may resume regular meals tomorrow.  Today it is better to start with liquids and gradually work up to solid foods.  You may eat anything you prefer, but it is better to start with liquids, then soup and crackers, and gradually work up to solid foods.   3) Please notify your doctor immediately if you have any unusual bleeding, trouble breathing, redness and pain at the surgery site, drainage, fever, or pain not relieved by medication.    4) Additional Instructions:    Please contact your physician with any problems or Same Day Surgery at [phone], Monday through Friday 6 am to 4 pm, or [facility] number at [phone].    Keep dressing dry and intact.  May shower after dressing changed on post-op day #4 (Monday).  Cover staples with Band-Aids after drying off. Apply ice frequently to shoulder. Take Aleve 2 tabs BID with meals for 7-10 days, then as necessary. Take oxycodone as prescribed when needed.  May supplement with ES Tylenol if necessary. Keep shoulder immobilizer on at all times except may remove for bathing purposes. Follow-up in 10-14 days or as scheduled.

## 2016-02-07 NOTE — Op Note (Signed)
02/07/2016  1:42 PM  Patient:   Allen Chang  Pre-Op Diagnosis:   Impingement/tendinopathy with near full-thickness rotator cuff tear, left shoulder.  Postoperative diagnosis: Impingement/tendinopathy with full-thickness tears of supraspinatus and subscapularis tendons, degenerative labral tearing, degenerative joint disease, and biceps tendinopathy, left shoulder.  Procedure: Extensive arthroscopic debridement, arthroscopic subacromial decompression, mini-open rotator cuff repairs, and mini-open biceps tenodesis, left shoulder.  Anesthesia: General endotracheal with interscalene block placed preoperatively by the anesthesiologist.  Surgeon:   Pascal Lux, MD  Assistant:   Cameron Proud, PA-C  Findings: As above. There were diffuse grade 2-3 chondromalacial changes involving both the glenoid and humeral head. The subscapularis tendon demonstrated significant articular surface tearing of the superior 50% of the subscapularis tendon and a full-thickness tear of the anterior insertional fibers of the supraspinatus tendon. The biceps tendon demonstrated significant tendinopathy changes with medial subluxation into the torn portion of the subscapularis tendon. There also is extensive labral fraying superiorly, anteriorly, and inferiorly.  Complications: None  Fluids:   950 cc  Estimated blood loss: 10 cc  Tourniquet time: None  Drains: None  Closure: Staples   Brief clinical note: The patient is an 82 year old male with a long history of left shoulder pain. The patient's symptoms have progressed despite medications, activity modification, etc. The patient's history and examination are consistent with impingement/tendinopathy with a rotator cuff tear. These findings were confirmed by MRI scan. The patient presents at this time for definitive management of these shoulder symptoms.  Procedure: The patient underwent placement of an interscalene block by the  anesthesiologist in the preoperative holding area before he was brought into the operating room and lain in the supine position. The patient then underwent general endotracheal intubation and anesthesia before being repositioned in the beach chair position using the beach chair positioner. The left shoulder and upper extremity were prepped with ChloraPrep solution before being draped sterilely. Preoperative antibiotics were administered. A timeout was performed to confirm the proper surgical site before the expected portal sites and incision site were injected with 0.5% Sensorcaine with epinephrine. A posterior portal was created and the glenohumeral joint thoroughly inspected with the findings as described above. An anterior portal was created using an outside-in technique. The labrum and rotator cuff were further probed, again confirming the above-noted findings. The areas of labral fraying and frayed margins of the rotator cuff tears were debrided back to stable margins using the full-radius resector. In addition, areas of loose articular cartilage on the humerus and glenoid also were debrided back to stable margins using the full-radius resector. The ArthroCare wand was inserted and used to release the biceps tendon from its attachment to the labrum superiorly. The ArthroCare wand also was used to obtain hemostasis as well as to "anneal" the labrum superiorly, anteriorly, and inferiorly. The instruments were removed from the joint after suctioning the excess fluid.  The camera was repositioned through the posterior portal into the subacromial space. A separate lateral portal was created using an outside-in technique. The 3.5 mm full-radius resector was introduced and used to perform a subtotal bursectomy. The ArthroCare wand was then inserted and used to remove the periosteal tissue off the undersurface of the anterior third of the acromion as well as to recess the coracoacromial ligament from its attachment  along the anterior and lateral margins of the acromion. The 4.0 mm acromionizing bur was introduced and used to complete the decompression by removing the undersurface of the anterior third of the acromion. The full radius resector  was reintroduced to remove any residual bony debris before the ArthroCare wand was reintroduced to obtain hemostasis. The instruments were then removed from the subacromial space after suctioning the excess fluid.  An approximately 4-5 cm incision was made over the anterolateral aspect of the shoulder beginning at the anterolateral corner of the acromion and extending distally in line with the bicipital groove. This incision was carried down through the subcutaneous tissues to expose the deltoid fascia. The raphae between the anterior and middle thirds was identified and this plane developed to provide access into the subacromial space. Additional bursal tissues were debrided sharply using Metzenbaum scissors. The rotator cuff tear involving the supraspinatus tendon was readily identified. The margins were debrided sharply with a #15 blade and the exposed greater tuberosity roughened with a rongeur. The tear was repaired using two Biomet 2.9 mm JuggerKnot anchors. Several of these sutures were then brought back laterally and secured using a [employer] anchor to create a two-layer closure. An apparent watertight closure was obtained. In addition, the superior fibers of the subscapularis tendon were divided in line with their fibers to expose the denuded portion of the lesser tuberosity. This area also was roughened with a rongeur before a single Biomet 2.9 juggernaut anchor was inserted into the lesser tuberosity. Both sets of sutures were passed through the subscapularis tendon and tied securely to effect the repair of this tendon.  The bicipital groove was identified by palpation and opened for 1-1.5 cm. The biceps tendon stump was retrieved through this defect. The floor of  the bicipital groove was roughened with a curet before another Biomet 2.9 mm JuggerKnot anchor was inserted. Both sets of sutures were passed through the biceps tendon and tied securely to effect the tenodesis. The bicipital sheath was reapproximated using two #0 Ethibond interrupted sutures, incorporating the biceps tendon to further reinforce the tenodesis.  The wound was copiously irrigated with sterile saline solution before the deltoid raphae was reapproximated using 2-0 Vicryl interrupted sutures. The subcutaneous tissues were closed in two layers using 2-0 Vicryl interrupted sutures before the skin was closed using staples. The portal sites also were closed using staples. A sterile bulky dressing was applied to the shoulder before the arm was placed into a shoulder immobilizer. The patient was then awakened, extubated, and returned to the recovery room in satisfactory condition after tolerating the procedure well.

## 2016-02-07 NOTE — H&P (Signed)
Paper H&P to be scanned into permanent record. H&P reviewed. No changes. 

## 2016-02-07 NOTE — Anesthesia Procedure Notes (Signed)
Procedure Name: Intubation Date/Time: 02/07/2016 11:57 AM Performed by: Rosaria Ferries, Adelaine Roppolo Pre-anesthesia Checklist: Patient identified, Emergency Drugs available, Suction available and Patient being monitored Patient Re-evaluated:Patient Re-evaluated prior to inductionOxygen Delivery Method: Circle system utilized Preoxygenation: Pre-oxygenation with 100% oxygen Intubation Type: IV induction Laryngoscope Size: Mac and 3 Grade View: Grade I Number of attempts: 1 Placement Confirmation: ETT inserted through vocal cords under direct vision,  positive ETCO2 and breath sounds checked- equal and bilateral Secured at: 23 cm Tube secured with: Tape Dental Injury: Teeth and Oropharynx as per pre-operative assessment

## 2016-02-08 ENCOUNTER — Encounter: Payer: Self-pay | Admitting: Surgery

## 2016-02-08 ENCOUNTER — Emergency Department: Payer: Medicare Other

## 2016-02-08 ENCOUNTER — Emergency Department
Admission: EM | Admit: 2016-02-08 | Discharge: 2016-02-08 | Disposition: A | Discharge status: Home / Self Care | Payer: Medicare Other | Attending: Emergency Medicine | Admitting: Emergency Medicine

## 2016-02-08 DIAGNOSIS — N189 Chronic kidney disease, unspecified: Secondary | ICD-10-CM | POA: Diagnosis not present

## 2016-02-08 DIAGNOSIS — R42 Dizziness and giddiness: Secondary | ICD-10-CM | POA: Diagnosis present

## 2016-02-08 DIAGNOSIS — M25512 Pain in left shoulder: Secondary | ICD-10-CM | POA: Diagnosis not present

## 2016-02-08 DIAGNOSIS — Z8546 Personal history of malignant neoplasm of prostate: Secondary | ICD-10-CM | POA: Insufficient documentation

## 2016-02-08 DIAGNOSIS — I129 Hypertensive chronic kidney disease with stage 1 through stage 4 chronic kidney disease, or unspecified chronic kidney disease: Secondary | ICD-10-CM | POA: Insufficient documentation

## 2016-02-08 DIAGNOSIS — Z7982 Long term (current) use of aspirin: Secondary | ICD-10-CM | POA: Insufficient documentation

## 2016-02-08 DIAGNOSIS — R079 Chest pain, unspecified: Secondary | ICD-10-CM

## 2016-02-08 DIAGNOSIS — R55 Syncope and collapse: Secondary | ICD-10-CM

## 2016-02-08 DIAGNOSIS — Z79899 Other long term (current) drug therapy: Secondary | ICD-10-CM | POA: Diagnosis not present

## 2016-02-08 LAB — CBC
HCT: 39.7 % — ABNORMAL LOW (ref 40.0–52.0)
HEMOGLOBIN: 13.3 g/dL (ref 13.0–18.0)
MCH: 34 pg (ref 26.0–34.0)
MCHC: 33.6 g/dL (ref 32.0–36.0)
MCV: 101.2 fL — ABNORMAL HIGH (ref 80.0–100.0)
PLATELETS: 161 10*3/uL (ref 150–440)
RBC: 3.92 MIL/uL — AB (ref 4.40–5.90)
RDW: 13.4 % (ref 11.5–14.5)
WBC: 16.6 10*3/uL — ABNORMAL HIGH (ref 3.8–10.6)

## 2016-02-08 LAB — BASIC METABOLIC PANEL
ANION GAP: 9 (ref 5–15)
BUN: 23 mg/dL — ABNORMAL HIGH (ref 6–20)
CALCIUM: 9.1 mg/dL (ref 8.9–10.3)
CO2: 24 mmol/L (ref 22–32)
CREATININE: 1.43 mg/dL — AB (ref 0.61–1.24)
Chloride: 103 mmol/L (ref 101–111)
GFR, EST AFRICAN AMERICAN: 51 mL/min — AB (ref >60)
GFR, EST NON AFRICAN AMERICAN: 44 mL/min — AB (ref >60)
GLUCOSE: 151 mg/dL — AB (ref 65–99)
Potassium: 4 mmol/L (ref 3.5–5.1)
Sodium: 136 mmol/L (ref 135–145)

## 2016-02-08 LAB — TROPONIN I

## 2016-02-08 MED ORDER — TRAMADOL HCL 50 MG PO TABS
ORAL_TABLET | ORAL | Status: AC
  Administered 2016-02-08: 50 mg via ORAL
  Filled 2016-02-08: qty 1

## 2016-02-08 MED ORDER — TRAMADOL HCL 50 MG PO TABS
50.0000 mg | ORAL_TABLET | Freq: Once | ORAL | Status: AC
  Administered 2016-02-08: 50 mg via ORAL

## 2016-02-08 MED ORDER — TRAMADOL HCL 50 MG PO TABS: 50.0000 mg | ORAL_TABLET | Freq: Four times a day (QID) | ORAL | 0 refills | Status: DC | PRN

## 2016-02-08 NOTE — ED Provider Notes (Signed)
[facility] Emergency Department Provider Note  ____________________________________________  Time seen: Approximately 3:26 PM  I have reviewed the triage vital signs and the nursing notes.   HISTORY  Chief Complaint Near Syncope    HPI Allen Chang is a 82 y.o. male who complains of an episode of lightheadedness today around 9:45 AM.  He had outpatient rotator cuff surgery yesterday by Dr. Roland Rack with orthopedics. This was uneventful. He went home and today he is having more pain as expected. At 9 AM he took all his medications including naproxen and 2 tablets of oxycodone 5 mg. 45 minutes later at 945, he started to feel lightheaded. He felt like his vision was darkening and thought he might pass out. He also had an episode of left-sided chest pain which is described as sharp. Nonradiating. No shortness of breath. No vomiting. He was noted to break out in a sweat during that time. He rested and after 5 minutes all symptoms resolved. No exertional component. He was able to walk around this morning without difficulty. Pain was not pleuritic.  . Currently feels a symptomatically, as he has since 10 AM.     Past Medical History:  Diagnosis Date  . Cancer [facility])    prostate   . Complication of anesthesia    bradycardia, in ICU for 24 hour after galbladder surgery.   . Diverticulosis 2012  . Dysrhythmia    Heart skips a beat  . Elevated lipids   . GERD (gastroesophageal reflux disease)   . History of hiatal hernia   . Hypertension   . Tremors of nervous system      Patient Active Problem List   Diagnosis Date Noted  . Status post left partial knee replacement 12/12/2014  . Bradycardia 10/10/2014  . Severe sinus bradycardia 10/10/2014     Past Surgical History:  Procedure Laterality Date  . BLADDER TUMOR EXCISION    . CARDIAC CATHETERIZATION    . CHOLECYSTECTOMY N/A 10/10/2014   Procedure: LAPAROSCOPIC CHOLECYSTECTOMY WITH INTRAOPERATIVE  CHOLANGIOGRAM;  Surgeon: Leonie Green, MD;  Location: [facility] ORS;  Service: General;  Laterality: N/A;  . HEMORRHOID SURGERY    . HERNIA REPAIR Left    x2  . JOINT REPLACEMENT Left    Partial Knee Replacement, Dr. Roland Rack  . PARTIAL KNEE ARTHROPLASTY Left 12/12/2014   Procedure: UNICOMPARTMENTAL KNEE;  Surgeon: Corky Mull, MD;  Location: [facility] ORS;  Service: Orthopedics;  Laterality: Left;  . prostate seeding    . SHOULDER ARTHROSCOPY Right   . SHOULDER ARTHROSCOPY WITH OPEN ROTATOR CUFF REPAIR Left 02/07/2016   Procedure: SHOULDER ARTHROSCOPY WITH OPEN ROTATOR CUFF REPAIR;  Surgeon: Corky Mull, MD;  Location: [facility] ORS;  Service: Orthopedics;  Laterality: Left;     Prior to Admission medications   Medication Sig Start Date End Date Taking? Authorizing Provider  aspirin 81 MG tablet Take 81 mg by mouth daily. In am   Yes Historical Provider, MD  docusate sodium (COLACE) 100 MG capsule Take 200 mg by mouth daily.    Yes Historical Provider, MD  Flaxseed, Linseed, (FLAX SEED OIL) 1000 MG CAPS Take 1 capsule by mouth daily.   Yes Historical Provider, MD  Garlic [id number] MG CAPS Take 1 capsule by mouth every morning.   Yes Historical Provider, MD  latanoprost (XALATAN) 0.005 % ophthalmic solution Place 1 drop into both eyes at bedtime.   Yes Historical Provider, MD  lisinopril (PRINIVIL,ZESTRIL) 20 MG tablet Take 10 mg by mouth daily.  In am   Yes Historical Provider, MD  loratadine (CLARITIN REDITABS) 10 MG dissolvable tablet Take 10 mg by mouth daily. In am   Yes Historical Provider, MD  Misc Natural Products (BLACK CHERRY CONCENTRATE) LIQD Take 15 mLs by mouth daily.    Yes Historical Provider, MD  Multiple Vitamins-Minerals (MULTIVITAMIN WITH MINERALS) tablet Take 1 tablet by mouth daily. In am   Yes Historical Provider, MD  naproxen sodium (ANAPROX) 220 MG tablet Take 440 mg by mouth 2 (two) times daily as needed (pain).   Yes Historical Provider, MD  Omega-3 Fatty Acids (FISH OIL) 1000  MG CAPS Take 1 capsule by mouth daily.   Yes Historical Provider, MD  omeprazole (PRILOSEC) 20 MG capsule Take 20 mg by mouth daily.    Yes Historical Provider, MD  oxyCODONE (ROXICODONE) 5 MG immediate release tablet Take 1-2 tablets (5-10 mg total) by mouth every 4 (four) hours as needed for severe pain. 02/07/16  Yes Corky Mull, MD  polyethylene glycol (MIRALAX / GLYCOLAX) packet Take 17 g by mouth daily.   Yes Historical Provider, MD  primidone (MYSOLINE) 50 MG tablet Take 50 mg by mouth 2 (two) times daily.   Yes Historical Provider, MD  traMADol (ULTRAM) 50 MG tablet Take 1 tablet (50 mg total) by mouth every 6 (six) hours as needed. 02/08/16   Carrie Mew, MD     Allergies Percocet [oxycodone-acetaminophen] and Voltaren [diclofenac sodium]   Family History  Problem Relation Age of Onset  . Bone cancer Father   . Hypertension Mother   . Osteoporosis Mother     Social History Social History  Substance Use Topics  . Smoking status: Never Smoker  . Smokeless tobacco: Never Used  . Alcohol use No    Review of Systems  Constitutional:   No fever or chills.  ENT:   No sore throat. No rhinorrhea. Cardiovascular:   Positive as above chest pain. Respiratory:   No dyspnea or cough. Gastrointestinal:   Negative for abdominal pain, vomiting and diarrhea.  Genitourinary:   Negative for dysuria or difficulty urinating. Musculoskeletal:   Left shoulder pain after surgery. Left upper extremity in an immobilizer Neurological:   Negative for headaches 10-point ROS otherwise negative.  ____________________________________________   PHYSICAL EXAM:  VITAL SIGNS: ED Triage Vitals  Enc Vitals Group     BP 02/08/16 1206 129/75     Pulse Rate 02/08/16 1206 64     Resp 02/08/16 1206 18     Temp 02/08/16 1206 97.9 F (36.6 C)     Temp Source 02/08/16 1206 Oral     SpO2 --      Weight 02/08/16 1208 174 lb (78.9 kg)     Height 02/08/16 1208 5\' 9"  (1.753 m)     Head  Circumference --      Peak Flow --      Pain Score 02/08/16 1208 0     Pain Loc --      Pain Edu? --      Excl. in [place]? --     Vital signs reviewed, nursing assessments reviewed.   Constitutional:   Alert and oriented. Well appearing and in no distress. Eyes:   No scleral icterus. No conjunctival pallor. PERRL. EOMI.  No nystagmus. ENT   Head:   Normocephalic and atraumatic.   Nose:   No congestion/rhinnorhea. No septal hematoma   Mouth/Throat:   MMM, no pharyngeal erythema. No peritonsillar mass.    Neck:   No stridor.  No SubQ emphysema. No meningismus. Hematological/Lymphatic/Immunilogical:   No cervical lymphadenopathy. Cardiovascular:   RRR. Symmetric bilateral radial and DP pulses.  No murmurs.  Respiratory:   Normal respiratory effort without tachypnea nor retractions. Breath sounds are clear and equal bilaterally. No wheezes/rales/rhonchi.Chest wall nontender Gastrointestinal:   Soft and nontender. Non distended. There is no CVA tenderness.  No rebound, rigidity, or guarding. Genitourinary:   deferred Musculoskeletal:   Nontender with normal range of motion in all extremities. No joint effusions.  No lower extremity tenderness.  No edema. No inflammatory changes in the soft tissues. Neurologic:   Normal speech and language.  CN 2-10 normal. Motor grossly intact. No gross focal neurologic deficits are appreciated.  Skin:    Skin is warm, dry and intact. No rash noted.  No petechiae, purpura, or bullae. No crepitus  ____________________________________________    LABS (pertinent positives/negatives) (all labs ordered are listed, but only abnormal results are displayed) Labs Reviewed  BASIC METABOLIC PANEL - Abnormal; Notable for the following:       Result Value   Glucose, Bld 151 (*)    BUN 23 (*)    Creatinine, Ser 1.43 (*)    GFR calc non Af Amer 44 (*)    GFR calc Af Amer 51 (*)    All other components within normal limits  CBC - Abnormal; Notable for  the following:    WBC 16.6 (*)    RBC 3.92 (*)    HCT 39.7 (*)    MCV 101.2 (*)    All other components within normal limits  TROPONIN I  TROPONIN I  URINALYSIS, COMPLETE (UACMP) WITH MICROSCOPIC   ____________________________________________   EKG  Interpreted by me Sinus rhythm rate of 58. Normal axis and intervals. Normal QRS ST segments and T waves. Unremarkable EKG.  ____________________________________________    RADIOLOGY  Chest x-ray unremarkable  ____________________________________________   PROCEDURES Procedures  ____________________________________________   INITIAL IMPRESSION / ASSESSMENT AND PLAN / ED COURSE  Pertinent labs & imaging results that were available during my care of the patient were reviewed by me and considered in my medical decision making (see chart for details).  Patient presents with brief episode of atypical chest pain and lightheadedness that started after taking 10 mg of oxycodone. He is an 82 year old male not normally on opioid medications. He just had surgery yesterday and this was his first dose after the regional nerve block wore off from surgery. Salt is to be a reaction to medication. Due to his age and comorbidities, checked a chest x-ray EKG troponin 2, which were all unremarkable. Labs revealed a leukocytosis of 16,000 which I think is related to his postop state. Don't think he has any acute infection. Does not appear to have any soft tissue infection or pneumonia or UTI.  Case discussed with Dr. Rudene Christians for orthopedics, no new recommendations. I'll change his oxycodone and tramadol. He has follow-up in 4 days. Discharge home, return precautions given.  Considering the patient's symptoms, medical history, and physical examination today, I have low suspicion for ACS, PE, TAD, pneumothorax, carditis, mediastinitis, pneumonia, CHF, or sepsis.       Clinical Course    ____________________________________________   FINAL  CLINICAL IMPRESSION(S) / ED DIAGNOSES  Final diagnoses:  Near syncope  Chronic kidney disease, unspecified CKD stage  Nonspecific chest pain      New Prescriptions   TRAMADOL (ULTRAM) 50 MG TABLET    Take 1 tablet (50 mg total) by mouth every 6 (six) hours  as needed.     Portions of this note were generated with dragon dictation software. Dictation errors may occur despite best attempts at proofreading.    Carrie Mew, MD 02/08/16 1537

## 2016-02-08 NOTE — ED Triage Notes (Signed)
Pt presents to ED from home via EMS c/o near syncope post ingestion of oxycontin due to left shoulder/arm surgery. Pt states he felt that he was going to pass out but didnt

## 2016-02-08 NOTE — Discharge Instructions (Signed)
Discontinue oxycodone.  Start taking tramadol as prescribed for pain, in addition to naproxen and acetaminophen.    We discussed today's events and findings with Dr. Rudene Christians.  There are no new instructions regarding your surgery at this time.  Follow up with Dr. Roland Rack on  Tuesday.    Your EKG and labs today were unremarkable.

## 2016-02-12 NOTE — Anesthesia Postprocedure Evaluation (Signed)
Anesthesia Post Note  Patient: Allen Chang  Procedure(s) Performed: Procedure(s) (LRB): SHOULDER ARTHROSCOPY WITH OPEN ROTATOR CUFF REPAIR (Left)  Patient location during evaluation: PACU Anesthesia Type: General Level of consciousness: awake and alert and oriented Pain management: pain level controlled Vital Signs Assessment: post-procedure vital signs reviewed and stable Respiratory status: spontaneous breathing Cardiovascular status: blood pressure returned to baseline Anesthetic complications: no Comments: Patient tolerated the GOT and Interscalene block well.  No apparent issues.     Last Vitals:  Vitals:   02/07/16 1446 02/07/16 1513  BP: (!) 151/80 (!) 146/79  Pulse: (!) 51 (!) 51  Resp: 16 16  Temp:      Last Pain:  Vitals:   02/08/16 0803  TempSrc:   PainSc: 5                  Kirstyn Lean

## 2016-10-01 DIAGNOSIS — N1831 Chronic kidney disease, stage 3a: Secondary | ICD-10-CM

## 2016-10-01 DIAGNOSIS — E039 Hypothyroidism, unspecified: Secondary | ICD-10-CM | POA: Diagnosis present

## 2016-10-01 DIAGNOSIS — N183 Chronic kidney disease, stage 3 unspecified: Secondary | ICD-10-CM

## 2016-10-01 HISTORY — DX: Chronic kidney disease, stage 3 unspecified: N18.30

## 2016-10-17 ENCOUNTER — Inpatient Hospital Stay: Payer: Medicare Other | Attending: Oncology | Admitting: Oncology

## 2016-10-17 ENCOUNTER — Inpatient Hospital Stay: Payer: Medicare Other

## 2016-10-17 ENCOUNTER — Other Ambulatory Visit: Payer: Self-pay

## 2016-10-17 ENCOUNTER — Encounter: Payer: Self-pay | Admitting: Oncology

## 2016-10-17 VITALS — BP 117/59 | HR 54 | Temp 97.2°F | Ht 69.0 in | Wt 179.3 lb

## 2016-10-17 DIAGNOSIS — K219 Gastro-esophageal reflux disease without esophagitis: Secondary | ICD-10-CM | POA: Diagnosis not present

## 2016-10-17 DIAGNOSIS — Z7982 Long term (current) use of aspirin: Secondary | ICD-10-CM | POA: Insufficient documentation

## 2016-10-17 DIAGNOSIS — Z8546 Personal history of malignant neoplasm of prostate: Secondary | ICD-10-CM | POA: Insufficient documentation

## 2016-10-17 DIAGNOSIS — E611 Iron deficiency: Secondary | ICD-10-CM

## 2016-10-17 DIAGNOSIS — D72820 Lymphocytosis (symptomatic): Secondary | ICD-10-CM

## 2016-10-17 DIAGNOSIS — D539 Nutritional anemia, unspecified: Secondary | ICD-10-CM

## 2016-10-17 DIAGNOSIS — D696 Thrombocytopenia, unspecified: Secondary | ICD-10-CM | POA: Diagnosis not present

## 2016-10-17 DIAGNOSIS — Z85828 Personal history of other malignant neoplasm of skin: Secondary | ICD-10-CM

## 2016-10-17 DIAGNOSIS — I10 Essential (primary) hypertension: Secondary | ICD-10-CM | POA: Diagnosis not present

## 2016-10-17 DIAGNOSIS — K449 Diaphragmatic hernia without obstruction or gangrene: Secondary | ICD-10-CM | POA: Insufficient documentation

## 2016-10-17 LAB — CBC WITH DIFFERENTIAL/PLATELET
Basophils Absolute: 0.1 10*3/uL (ref 0–0.1)
Basophils Relative: 0 %
EOS ABS: 0.3 10*3/uL (ref 0–0.7)
EOS PCT: 1 %
HCT: 39.6 % — ABNORMAL LOW (ref 40.0–52.0)
Hemoglobin: 13.5 g/dL (ref 13.0–18.0)
LYMPHS ABS: 18.3 10*3/uL — AB (ref 1.0–3.6)
LYMPHS PCT: 82 %
MCH: 34.8 pg — AB (ref 26.0–34.0)
MCHC: 34 g/dL (ref 32.0–36.0)
MCV: 102.2 fL — AB (ref 80.0–100.0)
MONO ABS: 0.6 10*3/uL (ref 0.2–1.0)
Monocytes Relative: 3 %
Neutro Abs: 3 10*3/uL (ref 1.4–6.5)
Neutrophils Relative %: 14 %
PLATELETS: 124 10*3/uL — AB (ref 150–440)
RBC: 3.87 MIL/uL — AB (ref 4.40–5.90)
RDW: 13.6 % (ref 11.5–14.5)
WBC: 22.3 10*3/uL — AB (ref 3.8–10.6)

## 2016-10-17 LAB — IRON AND TIBC
Iron: 119 ug/dL (ref 45–182)
Saturation Ratios: 36 % (ref 17.9–39.5)
TIBC: 327 ug/dL (ref 250–450)
UIBC: 208 ug/dL

## 2016-10-17 LAB — FERRITIN: Ferritin: 16 ng/mL — ABNORMAL LOW (ref 24–336)

## 2016-10-17 LAB — VITAMIN B12: Vitamin B-12: 537 pg/mL (ref 180–914)

## 2016-10-17 LAB — FOLATE: Folate: 55.3 ng/mL (ref >5.9)

## 2016-10-17 MED ORDER — FERROUS SULFATE 325 (65 FE) MG PO TBEC: 325.0000 mg | DELAYED_RELEASE_TABLET | Freq: Two times a day (BID) | ORAL | 3 refills | Status: DC

## 2016-10-17 NOTE — Progress Notes (Signed)
Hematology/Oncology Consult note [facility] [phone] [phone]  CONSULT NOTE Patient Care Team: Tracie Harrier, MD as PCP - General (Internal Medicine)  CHIEF COMPLAINTS/PURPOSE OF CONSULTATION:  Lukocytosis   HISTORY OF PRESENTING ILLNESS:  Allen Chang 83 y.o.  male with PMH listed as below who was referred by primary care provider to me for evaluation of leukocytosis. He has a history of prostate cancer which was diagnosed 10 years ago. He underwent brachytherapy. He is not any castration treatment. Per patient, he follows up with Dr.Wolf and most recent PSA is normal.   Patient also reports feeling fatigue lately. Recent labs show leukocytosis, mild anemia, macrocytosis, mild thrombocytopenia, low ferritin. Denies blood in the stool. He was started on over the counter iron supplement but wife who manages patient's medication decides to only let patient to take iron medication every other day.   ROS:  Review of Systems  Constitutional: Positive for fatigue.  HENT:  Negative.   Eyes: Negative.   Respiratory: Negative.   Cardiovascular: Negative.   Gastrointestinal: Negative.   Endocrine: Negative.   Genitourinary: Negative.    Musculoskeletal: Negative.   Skin: Negative.   Neurological: Negative.   Hematological: Negative.   Psychiatric/Behavioral: Negative.     MEDICAL HISTORY:  Past Medical History:  Diagnosis Date  . Cancer [facility])    prostate   . Complication of anesthesia    bradycardia, in ICU for 24 hour after galbladder surgery.   . Diverticulosis 2012  . Dysrhythmia    Heart skips a beat  . Elevated lipids   . GERD (gastroesophageal reflux disease)   . History of hiatal hernia   . Hypertension   . Skin cancer    face, scalp, behind ear,back and hand  . Tremors of nervous system     SURGICAL HISTORY: Past Surgical History:  Procedure Laterality Date  . BLADDER TUMOR EXCISION    . CARDIAC  CATHETERIZATION    . CHOLECYSTECTOMY N/A 10/10/2014   Procedure: LAPAROSCOPIC CHOLECYSTECTOMY WITH INTRAOPERATIVE CHOLANGIOGRAM;  Surgeon: Leonie Green, MD;  Location: [facility] ORS;  Service: General;  Laterality: N/A;  . HEMORRHOID SURGERY    . HERNIA REPAIR Left    x2  . JOINT REPLACEMENT Left    Partial Knee Replacement, Dr. Roland Rack  . PARTIAL KNEE ARTHROPLASTY Left 12/12/2014   Procedure: UNICOMPARTMENTAL KNEE;  Surgeon: Corky Mull, MD;  Location: [facility] ORS;  Service: Orthopedics;  Laterality: Left;  . prostate seeding    . SHOULDER ARTHROSCOPY Right   . SHOULDER ARTHROSCOPY WITH OPEN ROTATOR CUFF REPAIR Left 02/07/2016   Procedure: SHOULDER ARTHROSCOPY WITH OPEN ROTATOR CUFF REPAIR;  Surgeon: Corky Mull, MD;  Location: [facility] ORS;  Service: Orthopedics;  Laterality: Left;    SOCIAL HISTORY: Social History   Social History  . Marital status: Married    Spouse name: N/A  . Number of children: N/A  . Years of education: N/A   Occupational History  . Not on file.   Social History Main Topics  . Smoking status: Never Smoker  . Smokeless tobacco: Never Used  . Alcohol use No  . Drug use: No  . Sexual activity: Yes   Other Topics Concern  . Not on file   Social History Narrative  . No narrative on file    FAMILY HISTORY: Family History  Problem Relation Age of Onset  . Bone cancer Father   . Hypertension Mother   . Osteoporosis Mother   . Breast cancer Sister   .  Cancer Brother   . Cancer Brother   . Lung cancer Brother   . Bladder Cancer Brother   . Melanoma Brother     ALLERGIES:  is allergic to percocet [oxycodone-acetaminophen] and voltaren [diclofenac sodium].  MEDICATIONS:  Current Outpatient Prescriptions  Medication Sig Dispense Refill  . aspirin 81 MG tablet Take 81 mg by mouth daily. In am    . docusate sodium (COLACE) 100 MG capsule Take 200 mg by mouth daily.     . Flaxseed, Linseed, (FLAX SEED OIL) 1000 MG CAPS Take 1 capsule by mouth daily.     . Garlic 5400 MG CAPS Take 1 capsule by mouth every morning.    . latanoprost (XALATAN) 0.005 % ophthalmic solution Place 1 drop into both eyes at bedtime.    Marland Kitchen levothyroxine (SYNTHROID, LEVOTHROID) 25 MCG tablet Take 25 mcg by mouth daily at 12 noon.    Marland Kitchen lisinopril (PRINIVIL,ZESTRIL) 20 MG tablet Take 10 mg by mouth daily. In am    . loratadine (CLARITIN REDITABS) 10 MG dissolvable tablet Take 10 mg by mouth daily. In am    . Misc Natural Products (BLACK CHERRY CONCENTRATE) LIQD Take 15 mLs by mouth daily.     . Multiple Vitamins-Minerals (MULTIVITAMIN WITH MINERALS) tablet Take 1 tablet by mouth daily. In am    . naproxen sodium (ANAPROX) 220 MG tablet Take 440 mg by mouth 2 (two) times daily as needed (pain).    . Omega-3 Fatty Acids (FISH OIL) 1000 MG CAPS Take 1 capsule by mouth daily.    Marland Kitchen omeprazole (PRILOSEC) 20 MG capsule Take 20 mg by mouth daily.     . polyethylene glycol (MIRALAX / GLYCOLAX) packet Take 17 g by mouth daily.    . primidone (MYSOLINE) 50 MG tablet Take 50 mg by mouth 2 (two) times daily.    . ferrous sulfate 325 (65 FE) MG EC tablet Take 1 tablet (325 mg total) by mouth 2 (two) times daily. 60 tablet 3   No current facility-administered medications for this visit.       Marland Kitchen  PHYSICAL EXAMINATION: ECOG PERFORMANCE STATUS: 0 - Asymptomatic Vitals:   10/17/16 1438  BP: (!) 117/59  Pulse: (!) 54  Temp: (!) 97.2 F (36.2 C)   Filed Weights   10/17/16 1438  Weight: 179 lb 5 oz (81.3 kg)    GENERAL:  Alert, no distress and comfortable.  EYES: no pallor or icterus OROPHARYNX: no thrush or ulceration; good dentition  NECK: supple, no masses felt LYMPH:  no palpable lymphadenopathy in the cervical, axillary or inguinal regions LUNGS: clear to auscultation and  No wheeze or crackles HEART/CVS: regular rate & rhythm and no murmurs; No lower extremity edema ABDOMEN: abdomen soft, non-tender and normal bowel sounds Musculoskeletal:no cyanosis of digits and no  clubbing  PSYCH: alert & oriented x 3  NEURO: no focal motor/sensory deficits SKIN:  no rashes or significant lesions  LABORATORY DATA:  I have reviewed the data as listed Lab Results  Component Value Date   WBC 22.3 (H) 10/17/2016   HGB 13.5 10/17/2016   HCT 39.6 (L) 10/17/2016   MCV 102.2 (H) 10/17/2016   PLT 124 (L) 10/17/2016    Recent Labs  02/08/16 1235  NA 136  K 4.0  CL 103  CO2 24  GLUCOSE 151*  BUN 23*  CREATININE 1.43*  CALCIUM 9.1  GFRNONAA 44*  GFRAA 51*    RADIOGRAPHIC STUDIES: I have personally reviewed the radiological images as listed and  agreed with the findings in the report. No results found. no recent images.   ASSESSMENT & PLAN:  1. Lymphocytosis   2. Macrocytic anemia   3. Iron deficiency   4. 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 Will send peripheral blood flow cytometry.  # Iron deficiency anemia Discussed with patient and his wife. Patient subjectively feels improved energy level after taking iron pills.  Discussed about oral iron supplementation or IV iron. Patient choose to take oral iron supplement. Rx sent to pharmacy. If ferritin does not improve in 3 months, can switch to IV iron.  # Macrocytic anemia:  check b12 and folate. ?MDS # Thrombocytopenia; flow cytometry.  All questions were answered. The patient knows to call the clinic with any problems questions or concerns.  Return of visit: 1 week. Thank you for this kind referral and the opportunity to participate in the care of this patient. A copy of today's note is routed to referring provider    [name], MD, PhD Hematology Oncology [facility] at [facility] Pager- [phone] 10/17/2016

## 2016-10-17 NOTE — Progress Notes (Signed)
Patient here today as a new patient  

## 2016-10-21 LAB — COMP PANEL: LEUKEMIA/LYMPHOMA

## 2016-10-21 LAB — COMPREHENSIVE METABOLIC PANEL
ALT: 22 U/L (ref 17–63)
AST: 27 U/L (ref 15–41)
Albumin: 4.4 g/dL (ref 3.5–5.0)
Alkaline Phosphatase: 61 U/L (ref 38–126)
Anion gap: 9 (ref 5–15)
BILIRUBIN TOTAL: 0.4 mg/dL (ref 0.3–1.2)
BUN: 27 mg/dL — ABNORMAL HIGH (ref 6–20)
CHLORIDE: 103 mmol/L (ref 101–111)
CO2: 25 mmol/L (ref 22–32)
CREATININE: 1.64 mg/dL — AB (ref 0.61–1.24)
Calcium: 9.2 mg/dL (ref 8.9–10.3)
GFR, EST AFRICAN AMERICAN: 43 mL/min — AB (ref >60)
GFR, EST NON AFRICAN AMERICAN: 37 mL/min — AB (ref >60)
Glucose, Bld: 85 mg/dL (ref 65–99)
Potassium: 4.9 mmol/L (ref 3.5–5.1)
Sodium: 137 mmol/L (ref 135–145)
TOTAL PROTEIN: 7.1 g/dL (ref 6.5–8.1)

## 2016-10-27 ENCOUNTER — Inpatient Hospital Stay: Payer: Medicare Other

## 2016-10-27 ENCOUNTER — Encounter: Payer: Self-pay | Admitting: Oncology

## 2016-10-27 ENCOUNTER — Other Ambulatory Visit: Payer: Self-pay | Admitting: *Deleted

## 2016-10-27 ENCOUNTER — Inpatient Hospital Stay: Payer: Medicare Other | Attending: Oncology | Admitting: Oncology

## 2016-10-27 VITALS — BP 111/59 | HR 96 | Temp 96.8°F | Resp 18 | Wt 177.2 lb

## 2016-10-27 DIAGNOSIS — C9110 Chronic lymphocytic leukemia of B-cell type not having achieved remission: Secondary | ICD-10-CM

## 2016-10-27 DIAGNOSIS — D696 Thrombocytopenia, unspecified: Secondary | ICD-10-CM | POA: Insufficient documentation

## 2016-10-27 DIAGNOSIS — R251 Tremor, unspecified: Secondary | ICD-10-CM

## 2016-10-27 DIAGNOSIS — R948 Abnormal results of function studies of other organs and systems: Secondary | ICD-10-CM | POA: Diagnosis not present

## 2016-10-27 DIAGNOSIS — I10 Essential (primary) hypertension: Secondary | ICD-10-CM | POA: Diagnosis not present

## 2016-10-27 DIAGNOSIS — Z803 Family history of malignant neoplasm of breast: Secondary | ICD-10-CM | POA: Diagnosis not present

## 2016-10-27 DIAGNOSIS — Z808 Family history of malignant neoplasm of other organs or systems: Secondary | ICD-10-CM

## 2016-10-27 DIAGNOSIS — Z79899 Other long term (current) drug therapy: Secondary | ICD-10-CM

## 2016-10-27 DIAGNOSIS — E611 Iron deficiency: Secondary | ICD-10-CM

## 2016-10-27 DIAGNOSIS — D509 Iron deficiency anemia, unspecified: Secondary | ICD-10-CM | POA: Diagnosis not present

## 2016-10-27 DIAGNOSIS — K449 Diaphragmatic hernia without obstruction or gangrene: Secondary | ICD-10-CM

## 2016-10-27 DIAGNOSIS — Z801 Family history of malignant neoplasm of trachea, bronchus and lung: Secondary | ICD-10-CM

## 2016-10-27 DIAGNOSIS — Z8052 Family history of malignant neoplasm of bladder: Secondary | ICD-10-CM

## 2016-10-27 DIAGNOSIS — K219 Gastro-esophageal reflux disease without esophagitis: Secondary | ICD-10-CM | POA: Diagnosis not present

## 2016-10-27 DIAGNOSIS — Z7982 Long term (current) use of aspirin: Secondary | ICD-10-CM

## 2016-10-27 DIAGNOSIS — C9190 Lymphoid leukemia, unspecified not having achieved remission: Secondary | ICD-10-CM

## 2016-10-27 DIAGNOSIS — Z8719 Personal history of other diseases of the digestive system: Secondary | ICD-10-CM | POA: Diagnosis not present

## 2016-10-27 DIAGNOSIS — Z85828 Personal history of other malignant neoplasm of skin: Secondary | ICD-10-CM

## 2016-10-27 DIAGNOSIS — D72829 Elevated white blood cell count, unspecified: Secondary | ICD-10-CM

## 2016-10-27 DIAGNOSIS — D539 Nutritional anemia, unspecified: Secondary | ICD-10-CM | POA: Diagnosis not present

## 2016-10-27 DIAGNOSIS — Z8546 Personal history of malignant neoplasm of prostate: Secondary | ICD-10-CM

## 2016-10-27 LAB — LACTATE DEHYDROGENASE: LDH: 116 U/L (ref 98–192)

## 2016-10-27 LAB — CBC WITH DIFFERENTIAL/PLATELET
Basophils Absolute: 0.1 10*3/uL (ref 0–0.1)
Basophils Relative: 1 %
EOS PCT: 1 %
Eosinophils Absolute: 0.2 10*3/uL (ref 0–0.7)
HEMATOCRIT: 40 % (ref 40.0–52.0)
Hemoglobin: 13.8 g/dL (ref 13.0–18.0)
LYMPHS PCT: 79 %
Lymphs Abs: 18.6 10*3/uL — ABNORMAL HIGH (ref 1.0–3.6)
MCH: 34.7 pg — ABNORMAL HIGH (ref 26.0–34.0)
MCHC: 34.4 g/dL (ref 32.0–36.0)
MCV: 100.8 fL — AB (ref 80.0–100.0)
MONO ABS: 0.8 10*3/uL (ref 0.2–1.0)
MONOS PCT: 4 %
NEUTROS ABS: 3.5 10*3/uL (ref 1.4–6.5)
Neutrophils Relative %: 15 %
PLATELETS: 137 10*3/uL — AB (ref 150–440)
RBC: 3.97 MIL/uL — ABNORMAL LOW (ref 4.40–5.90)
RDW: 13.5 % (ref 11.5–14.5)
WBC: 23.2 10*3/uL — ABNORMAL HIGH (ref 3.8–10.6)

## 2016-10-27 LAB — URINALYSIS, COMPLETE (UACMP) WITH MICROSCOPIC
Bacteria, UA: NONE SEEN
Bilirubin Urine: NEGATIVE
GLUCOSE, UA: NEGATIVE mg/dL
HGB URINE DIPSTICK: NEGATIVE
KETONES UR: NEGATIVE mg/dL
Leukocytes, UA: NEGATIVE
NITRITE: NEGATIVE
PH: 5 (ref 5.0–8.0)
Protein, ur: NEGATIVE mg/dL
Specific Gravity, Urine: 1.013 (ref 1.005–1.030)
Squamous Epithelial / LPF: NONE SEEN
WBC UA: NONE SEEN WBC/hpf (ref 0–5)

## 2016-10-27 MED ORDER — FERROUS SULFATE 325 (65 FE) MG PO TBEC: 325.0000 mg | DELAYED_RELEASE_TABLET | Freq: Three times a day (TID) | ORAL | 0 refills | Status: DC

## 2016-10-27 NOTE — Progress Notes (Signed)
Hematology/Oncology Follow Up Note [facility] [phone] [phone]  CONSULT NOTE Patient Care Team: Tracie Harrier, MD as PCP - General (Internal Medicine)  CHIEF COMPLAINTS/PURPOSE OF CONSULTATION:  Lukocytosis   HISTORY OF PRESENTING ILLNESS:  Allen Chang 83 y.o.  male with PMH listed as below who was referred by primary care provider to me for evaluation of leukocytosis. He has a history of prostate cancer which was diagnosed 10 years ago. He underwent brachytherapy. He is not any castration treatment. Per patient, he follows up with Dr.Wolf and most recent PSA is normal.   Patient also reports feeling fatigue lately. Recent labs show leukocytosis, mild anemia, macrocytosis, mild thrombocytopenia, low ferritin. Denies blood in the stool. He was started on over the counter iron supplement but wife who manages patient's medication decides to only let patient to take iron medication every other day.   INTERVAL HISTORY Patient presents to discuss about the results. No new complaints. He takes OTC iron supplementation twice a day. He reports energy level has been improving.    ROS:  Review of Systems  Constitutional: Negative.   HENT:  Negative.   Eyes: Negative.   Respiratory: Negative.   Cardiovascular: Negative.   Gastrointestinal: Negative.   Endocrine: Negative.   Genitourinary: Negative.    Musculoskeletal: Negative.   Skin: Negative.   Neurological: Negative.   Hematological: Negative.   Psychiatric/Behavioral: Negative.     MEDICAL HISTORY:  Past Medical History:  Diagnosis Date  . Cancer [facility])    prostate   . Complication of anesthesia    bradycardia, in ICU for 24 hour after galbladder surgery.   . Diverticulosis 2012  . Dysrhythmia    Heart skips a beat  . Elevated lipids   . GERD (gastroesophageal reflux disease)   . History of hiatal hernia   . Hypertension   . Skin cancer    face, scalp, behind  ear,back and hand  . Tremors of nervous system     SURGICAL HISTORY: Past Surgical History:  Procedure Laterality Date  . BLADDER TUMOR EXCISION    . CARDIAC CATHETERIZATION    . CHOLECYSTECTOMY N/A 10/10/2014   Procedure: LAPAROSCOPIC CHOLECYSTECTOMY WITH INTRAOPERATIVE CHOLANGIOGRAM;  Surgeon: Leonie Green, MD;  Location: [facility] ORS;  Service: General;  Laterality: N/A;  . HEMORRHOID SURGERY    . HERNIA REPAIR Left    x2  . JOINT REPLACEMENT Left    Partial Knee Replacement, Dr. Roland Rack  . PARTIAL KNEE ARTHROPLASTY Left 12/12/2014   Procedure: UNICOMPARTMENTAL KNEE;  Surgeon: Corky Mull, MD;  Location: [facility] ORS;  Service: Orthopedics;  Laterality: Left;  . prostate seeding    . SHOULDER ARTHROSCOPY Right   . SHOULDER ARTHROSCOPY WITH OPEN ROTATOR CUFF REPAIR Left 02/07/2016   Procedure: SHOULDER ARTHROSCOPY WITH OPEN ROTATOR CUFF REPAIR;  Surgeon: Corky Mull, MD;  Location: [facility] ORS;  Service: Orthopedics;  Laterality: Left;    SOCIAL HISTORY: Social History   Social History  . Marital status: Married    Spouse name: N/A  . Number of children: N/A  . Years of education: N/A   Occupational History  . Not on file.   Social History Main Topics  . Smoking status: Never Smoker  . Smokeless tobacco: Never Used  . Alcohol use No  . Drug use: No  . Sexual activity: Yes   Other Topics Concern  . Not on file   Social History Narrative  . No narrative on file    FAMILY  HISTORY: Family History  Problem Relation Age of Onset  . Bone cancer Father   . Hypertension Mother   . Osteoporosis Mother   . Breast cancer Sister   . Cancer Brother   . Cancer Brother   . Lung cancer Brother   . Bladder Cancer Brother   . Melanoma Brother     ALLERGIES:  is allergic to percocet [oxycodone-acetaminophen] and voltaren [diclofenac sodium].  MEDICATIONS:  Current Outpatient Prescriptions  Medication Sig Dispense Refill  . aspirin 81 MG tablet Take 81 mg by mouth daily.  In am    . docusate sodium (COLACE) 100 MG capsule Take 200 mg by mouth daily.     . ferrous sulfate 325 (65 FE) MG EC tablet Take 1 tablet (325 mg total) by mouth 3 (three) times daily with meals. 90 tablet 0  . Flaxseed, Linseed, (FLAX SEED OIL) 1000 MG CAPS Take 1 capsule by mouth daily.    . Garlic 7939 MG CAPS Take 1 capsule by mouth every morning.    . latanoprost (XALATAN) 0.005 % ophthalmic solution Place 1 drop into both eyes at bedtime.    Marland Kitchen levothyroxine (SYNTHROID, LEVOTHROID) 25 MCG tablet Take 25 mcg by mouth daily at 12 noon.    Marland Kitchen lisinopril (PRINIVIL,ZESTRIL) 20 MG tablet Take 10 mg by mouth daily. In am    . loratadine (CLARITIN REDITABS) 10 MG dissolvable tablet Take 10 mg by mouth daily. In am    . Misc Natural Products (BLACK CHERRY CONCENTRATE) LIQD Take 15 mLs by mouth daily.     . Multiple Vitamins-Minerals (MULTIVITAMIN WITH MINERALS) tablet Take 1 tablet by mouth daily. In am    . naproxen sodium (ANAPROX) 220 MG tablet Take 440 mg by mouth 2 (two) times daily as needed (pain).    . Omega-3 Fatty Acids (FISH OIL) 1000 MG CAPS Take 1 capsule by mouth daily.    Marland Kitchen omeprazole (PRILOSEC) 20 MG capsule Take 20 mg by mouth daily.     . polyethylene glycol (MIRALAX / GLYCOLAX) packet Take 17 g by mouth daily.    . primidone (MYSOLINE) 50 MG tablet Take 50 mg by mouth 2 (two) times daily.     No current facility-administered medications for this visit.       Marland Kitchen  PHYSICAL EXAMINATION: ECOG PERFORMANCE STATUS: 0 - Asymptomatic Vitals:   10/27/16 1429  BP: (!) 111/59  Pulse: 96  Resp: 18  Temp: (!) 96.8 F (36 C)   Filed Weights   10/27/16 1429  Weight: 177 lb 4 oz (80.4 kg)    GENERAL:  Alert, no distress and comfortable.  EYES: no pallor or icterus OROPHARYNX: no thrush or ulceration; good dentition  NECK: supple, no masses felt LYMPH:  no palpable lymphadenopathy in the cervical, axillary or inguinal regions LUNGS: clear to auscultation and  No wheeze or  crackles HEART/CVS: regular rate & rhythm and no murmurs; No lower extremity edema ABDOMEN: abdomen soft, non-tender and normal bowel sounds Musculoskeletal:no cyanosis of digits and no clubbing  PSYCH: alert & oriented x 3  NEURO: no focal motor/sensory deficits SKIN:  no rashes or significant lesions  LABORATORY DATA:  I have reviewed the data as listed Lab Results  Component Value Date   WBC 23.2 (H) 10/27/2016   HGB 13.8 10/27/2016   HCT 40.0 10/27/2016   MCV 100.8 (H) 10/27/2016   PLT 137 (L) 10/27/2016    Recent Labs  02/08/16 1235 10/17/16 1510  NA 136 137  K 4.0  4.9  CL 103 103  CO2 24 25  GLUCOSE 151* 85  BUN 23* 27*  CREATININE 1.43* 1.64*  CALCIUM 9.1 9.2  GFRNONAA 44* 37*  GFRAA 51* 43*  PROT  --  7.1  ALBUMIN  --  4.4  AST  --  27  ALT  --  22  ALKPHOS  --  61  BILITOT  --  0.4    RADIOGRAPHIC STUDIES: I have personally reviewed the radiological images as listed and agreed with the findings in the report. no recent images.  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   ASSESSMENT & PLAN:  1. CLL (chronic lymphocytic leukemia) ([place])   2. Macrocytic anemia   3. Iron deficiency   4. Thrombocytopenia ([place])    # CLL, will send CLL prognostic FISH panel, LDH, beta microglobulin. Clinically Rai Stage 0.  Obtain US abdomen for evaluation of live and spleen. Thrombocytopenia, maybe CLL related, rule out other etiologies, check hepatitis, HIV  # Iron deficiency anemia, patient subjectively feels improved energy level after taking iron pills.  Discussed about oral iron supplementation or IV iron. Patient choose to take oral iron supplement. Rx sent to pharmacy. If ferritin does not improve in 2 months, can switch to IV iron.  # Macrocytic anemia:  Normal folate and B12. ?MDS or CLL involvement. Possible bone marrow biopsy in the future. Discussed with patient.   # Thrombocytopenia All questions were answered. The  patient knows to call the clinic with any problems questions or concerns.  Return of visit: 2 months.  Total face to face encounter time for this patient visit was 40 min. >50% of the time was  spent in counseling and coordination of care.  Thank you for this kind referral and the opportunity to participate in the care of this patient. A copy of today's note is routed to referring provider Dr.Hande.    [name], MD, PhD Hematology Oncology [facility] at [facility] Pager- [phone] 10/27/2016

## 2016-10-27 NOTE — Progress Notes (Signed)
Patient here today for follow up.  Patient states no new concerns today  

## 2016-10-28 LAB — HEPATITIS PANEL, ACUTE
HEP B S AG: NEGATIVE
Hep A IgM: NEGATIVE
Hep B C IgM: NEGATIVE

## 2016-10-28 LAB — BETA 2 MICROGLOBULIN, SERUM: Beta-2 Microglobulin: 3.3 mg/L — ABNORMAL HIGH (ref 0.6–2.4)

## 2016-10-28 LAB — HIV ANTIBODY (ROUTINE TESTING W REFLEX): HIV SCREEN 4TH GENERATION: NONREACTIVE

## 2016-10-31 LAB — FISH HES LEUKEMIA, 4Q12 REA: PDF LCA: 0

## 2016-11-04 ENCOUNTER — Ambulatory Visit
Admission: RE | Admit: 2016-11-04 | Discharge: 2016-11-04 | Disposition: A | Discharge status: Home / Self Care | Payer: Medicare Other | Source: Ambulatory Visit | Attending: Oncology | Admitting: Oncology

## 2016-11-04 DIAGNOSIS — N281 Cyst of kidney, acquired: Secondary | ICD-10-CM | POA: Diagnosis not present

## 2016-11-04 DIAGNOSIS — Z9049 Acquired absence of other specified parts of digestive tract: Secondary | ICD-10-CM | POA: Diagnosis not present

## 2016-11-04 DIAGNOSIS — D696 Thrombocytopenia, unspecified: Secondary | ICD-10-CM | POA: Diagnosis not present

## 2016-12-28 NOTE — Progress Notes (Signed)
Hematology/Oncology Follow Up Note [facility] [phone] [phone]  CONSULT NOTE Patient Care Team: Tracie Harrier, MD as PCP - General (Internal Medicine)  REASON FOR VISIT Follow up for treatment of CLL and iron deficiency anemia   HISTORY OF PRESENTING ILLNESS:  Allen Chang 83 y.o.  male with PMH listed as below who was referred by primary care provider to me for evaluation of leukocytosis. He has a history of prostate cancer which was diagnosed 10 years ago. He underwent brachytherapy. He is not any castration treatment. Per patient, he follows up with Dr.Wolf and most recent PSA is normal.   Patient also reports feeling fatigue lately. Recent labs show leukocytosis, mild anemia, macrocytosis, mild thrombocytopenia, low ferritin. Denies blood in the stool. He was started on over the counter iron supplement but wife who manages patient's medication decides to only let patient to take iron medication every other day.   INTERVAL HISTORY Patient presents to follow-up for management of CLL and iron deficiency anemia. He has been taking oral ferrous sulfate twice a day. He doesn't have much complaints today. 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 He has good appetite. Denies any fever, chills, chest pain, weight loss, night sweats.  ROS:  Review of Systems  Constitutional: Negative.  Negative for chills and fatigue.  HENT:  Negative.  Negative for lump/mass.   Eyes: Negative.  Negative for eye problems.  Respiratory: Negative.  Negative for chest tightness.   Cardiovascular: Negative.  Negative for chest pain.  Gastrointestinal: Negative.  Negative for abdominal distention.  Endocrine: Negative.  Negative for hot flashes.  Genitourinary: Negative.  Negative for dysuria.   Musculoskeletal: Negative.  Negative for arthralgias.  Skin: Negative.  Negative for  itching.  Neurological: Negative.  Negative for dizziness.  Hematological: Negative.   Psychiatric/Behavioral: Negative.  The patient is not nervous/anxious.     MEDICAL HISTORY:  Past Medical History:  Diagnosis Date  . Cancer [facility])    prostate   . Complication of anesthesia    bradycardia, in ICU for 24 hour after galbladder surgery.   . Diverticulosis 2012  . Dysrhythmia    Heart skips a beat  . Elevated lipids   . GERD (gastroesophageal reflux disease)   . History of hiatal hernia   . Hypertension   . Skin cancer    face, scalp, behind ear,back and hand  . Tremors of nervous system     SURGICAL HISTORY: Past Surgical History:  Procedure Laterality Date  . BLADDER TUMOR EXCISION    . CARDIAC CATHETERIZATION    . HEMORRHOID SURGERY    . HERNIA REPAIR Left    x2  . JOINT REPLACEMENT Left    Partial Knee Replacement, Dr. Roland Rack  . prostate seeding    . SHOULDER ARTHROSCOPY Right     SOCIAL HISTORY: Social History   Socioeconomic History  . Marital status: Married    Spouse name: Not on file  . Number of children: Not on file  . Years of education: Not on file  . Highest education level: Not on file  Social Needs  . Financial resource strain: Not on file  . Food insecurity - worry: Not on file  . Food insecurity - inability: Not on file  . Transportation needs - medical: Not on file  . Transportation needs - non-medical: Not on file  Occupational History  .  Not on file  Tobacco Use  . Smoking status: Never Smoker  . Smokeless tobacco: Never Used  Substance and Sexual Activity  . Alcohol use: No  . Drug use: No  . Sexual activity: Yes  Other Topics Concern  . Not on file  Social History Narrative  . Not on file    FAMILY HISTORY: Family History  Problem Relation Age of Onset  . Bone cancer Father   . Hypertension Mother   . Osteoporosis Mother   . Breast cancer Sister   . Cancer Brother   . Cancer Brother   . Lung cancer Brother   . Bladder  Cancer Brother   . Melanoma Brother     ALLERGIES:  is allergic to percocet [oxycodone-acetaminophen] and voltaren [diclofenac sodium].  MEDICATIONS:  Current Outpatient Medications  Medication Sig Dispense Refill  . aspirin 81 MG tablet Take 81 mg by mouth daily. In am    . docusate sodium (COLACE) 100 MG capsule Take 200 mg by mouth daily.     . ferrous sulfate 325 (65 FE) MG EC tablet Take 1 tablet (325 mg total) by mouth 3 (three) times daily with meals. 90 tablet 0  . Flaxseed, Linseed, (FLAX SEED OIL) 1000 MG CAPS Take 1 capsule by mouth daily.    . Garlic 3244 MG CAPS Take 1 capsule by mouth every morning.    . latanoprost (XALATAN) 0.005 % ophthalmic solution Place 1 drop into both eyes at bedtime.    Marland Kitchen levothyroxine (SYNTHROID, LEVOTHROID) 25 MCG tablet Take 25 mcg by mouth daily at 12 noon.    Marland Kitchen lisinopril (PRINIVIL,ZESTRIL) 20 MG tablet Take 10 mg by mouth daily. In am    . loratadine (CLARITIN REDITABS) 10 MG dissolvable tablet Take 10 mg by mouth daily. In am    . Misc Natural Products (BLACK CHERRY CONCENTRATE) LIQD Take 15 mLs by mouth daily.     . Multiple Vitamins-Minerals (MULTIVITAMIN WITH MINERALS) tablet Take 1 tablet by mouth daily. In am    . naproxen sodium (ANAPROX) 220 MG tablet Take 440 mg by mouth 2 (two) times daily as needed (pain).    . Omega-3 Fatty Acids (FISH OIL) 1000 MG CAPS Take 1 capsule by mouth daily.    Marland Kitchen omeprazole (PRILOSEC) 20 MG capsule Take 20 mg by mouth daily.     . polyethylene glycol (MIRALAX / GLYCOLAX) packet Take 17 g by mouth daily.    . primidone (MYSOLINE) 50 MG tablet Take 50 mg by mouth 2 (two) times daily.     No current facility-administered medications for this visit.       Marland Kitchen  PHYSICAL EXAMINATION: ECOG PERFORMANCE STATUS: 0 - Asymptomatic Vitals:   12/29/16 1413  BP: (!) 143/76  Pulse: (!) 55  Resp: 18  Temp: (!) 97.5 F (36.4 C)   Filed Weights   12/29/16 1413  Weight: 176 lb (79.8 kg)   GENERAL: No distress,  well nourished.  SKIN:  No rashes or significant lesions  HEAD: Normocephalic, No masses, lesions, tenderness or abnormalities  EYES: Conjunctiva are pink, non icteric ENT: External ears normal ,lips , buccal mucosa, and tongue normal and mucous membranes are moist  LYMPH: No palpable cervical and axillary lymphadenopathy  LUNGS: Clear to auscultation, no crackles or wheezes HEART: Regular rate & rhythm, no murmurs, no gallops, S1 normal and S2 normal  ABDOMEN: Abdomen soft, non-tender, normal bowel sounds, I did not appreciate any  masses or organomegaly  MUSCULOSKELETAL: No CVA tenderness and  no tenderness on percussion of the back or rib cage.  EXTREMITIES: No edema, no skin discoloration or tenderness NEURO: Alert & oriented, no focal motor/sensory deficits.    LABORATORY DATA:  I have reviewed the data as listed Lab Results  Component Value Date   WBC 23.2 (H) 10/27/2016   HGB 13.8 10/27/2016   HCT 40.0 10/27/2016   MCV 100.8 (H) 10/27/2016   PLT 137 (L) 10/27/2016   Recent Labs    02/08/16 1235 10/17/16 1510  NA 136 137  K 4.0 4.9  CL 103 103  CO2 24 25  GLUCOSE 151* 85  BUN 23* 27*  CREATININE 1.43* 1.64*  CALCIUM 9.1 9.2  GFRNONAA 44* 37*  GFRAA 51* 43*  PROT  --  7.1  ALBUMIN  --  4.4  AST  --  27  ALT  --  22  ALKPHOS  --  61  BILITOT  --  0.4    RADIOGRAPHIC STUDIES: I have personally reviewed the radiological images as listed and agreed with the findings in the report. no recent images.  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  Cytogenetics revealed Trisomy 12  ASSESSMENT & PLAN:  1. CLL (chronic lymphocytic leukemia) ([place])   2. Macrocytic anemia   3. Iron deficiency   4. Thrombocytopenia ([place])    # CLL, will send CLL prognostic FISH panel, LDH, beta microglobulin. Clinically Rai Stage 0.  US abdomen showed no organomegaly. Thrombocytopenia, maybe CLL related, rule out other etiologies, negative  hepatitis and HIV. Discussed with patient that since he is completely asymptomatic, continue watchful waiting.  # Iron deficiency anemia,patient has been taking oral ferrous sulfate twice a day. His ferritin continue to be mildly low. I discussed with patient after the ferritin labs come back.Plan IV iron with Venofer 253m weeklyfor 4 doses. Allergy reactions/infusion reaction including anaphylactic reaction discussed with patient. Patient voices understanding and willing to proceed. He already has appointment with GI for endoscopy.   # Macrocytic anemia:  Normal folate and B12. ?MDS or CLL involvement. Possible bone marrow biopsy in the future. Discussed with patient.   All questions were answered. The patient knows to call the clinic with any problems questions or concerns.  Return of visit: 3 months. With repeat CBC, iron and ferritin.  ZEarlie Server MD, PhD Hematology Oncology CSturdy Memorial Hospitalat AWestern Nevada Surgical Center IncPager- [phone]/12/2016

## 2016-12-29 ENCOUNTER — Inpatient Hospital Stay: Payer: Medicare Other | Attending: Oncology

## 2016-12-29 ENCOUNTER — Encounter: Payer: Self-pay | Admitting: Oncology

## 2016-12-29 ENCOUNTER — Inpatient Hospital Stay (HOSPITAL_BASED_OUTPATIENT_CLINIC_OR_DEPARTMENT_OTHER): Payer: Medicare Other | Admitting: Oncology

## 2016-12-29 ENCOUNTER — Other Ambulatory Visit: Payer: Self-pay

## 2016-12-29 VITALS — BP 143/76 | HR 55 | Temp 97.5°F | Resp 18 | Wt 176.0 lb

## 2016-12-29 DIAGNOSIS — D696 Thrombocytopenia, unspecified: Secondary | ICD-10-CM | POA: Diagnosis not present

## 2016-12-29 DIAGNOSIS — Z85828 Personal history of other malignant neoplasm of skin: Secondary | ICD-10-CM | POA: Insufficient documentation

## 2016-12-29 DIAGNOSIS — C9110 Chronic lymphocytic leukemia of B-cell type not having achieved remission: Secondary | ICD-10-CM

## 2016-12-29 DIAGNOSIS — K449 Diaphragmatic hernia without obstruction or gangrene: Secondary | ICD-10-CM | POA: Insufficient documentation

## 2016-12-29 DIAGNOSIS — E611 Iron deficiency: Secondary | ICD-10-CM

## 2016-12-29 DIAGNOSIS — Z7982 Long term (current) use of aspirin: Secondary | ICD-10-CM | POA: Diagnosis not present

## 2016-12-29 DIAGNOSIS — D509 Iron deficiency anemia, unspecified: Secondary | ICD-10-CM | POA: Diagnosis not present

## 2016-12-29 DIAGNOSIS — K219 Gastro-esophageal reflux disease without esophagitis: Secondary | ICD-10-CM | POA: Diagnosis not present

## 2016-12-29 DIAGNOSIS — D539 Nutritional anemia, unspecified: Secondary | ICD-10-CM | POA: Insufficient documentation

## 2016-12-29 DIAGNOSIS — I10 Essential (primary) hypertension: Secondary | ICD-10-CM | POA: Insufficient documentation

## 2016-12-29 DIAGNOSIS — Z8546 Personal history of malignant neoplasm of prostate: Secondary | ICD-10-CM | POA: Insufficient documentation

## 2016-12-29 DIAGNOSIS — Z79899 Other long term (current) drug therapy: Secondary | ICD-10-CM | POA: Diagnosis not present

## 2016-12-29 LAB — COMPREHENSIVE METABOLIC PANEL
ALT: 19 U/L (ref 17–63)
ANION GAP: 11 (ref 5–15)
AST: 23 U/L (ref 15–41)
Albumin: 4.3 g/dL (ref 3.5–5.0)
Alkaline Phosphatase: 67 U/L (ref 38–126)
BILIRUBIN TOTAL: 0.7 mg/dL (ref 0.3–1.2)
BUN: 30 mg/dL — AB (ref 6–20)
CHLORIDE: 102 mmol/L (ref 101–111)
CO2: 24 mmol/L (ref 22–32)
Calcium: 9.4 mg/dL (ref 8.9–10.3)
Creatinine, Ser: 1.31 mg/dL — ABNORMAL HIGH (ref 0.61–1.24)
GFR, EST AFRICAN AMERICAN: 57 mL/min — AB (ref >60)
GFR, EST NON AFRICAN AMERICAN: 49 mL/min — AB (ref >60)
Glucose, Bld: 109 mg/dL — ABNORMAL HIGH (ref 65–99)
POTASSIUM: 5.1 mmol/L (ref 3.5–5.1)
Sodium: 137 mmol/L (ref 135–145)
TOTAL PROTEIN: 7.2 g/dL (ref 6.5–8.1)

## 2016-12-29 LAB — CBC WITH DIFFERENTIAL/PLATELET
BASOS ABS: 0.1 10*3/uL (ref 0–0.1)
Basophils Relative: 0 %
Eosinophils Absolute: 0.3 10*3/uL (ref 0–0.7)
Eosinophils Relative: 1 %
HEMATOCRIT: 39.2 % — AB (ref 40.0–52.0)
HEMOGLOBIN: 13 g/dL (ref 13.0–18.0)
LYMPHS ABS: 19.6 10*3/uL — AB (ref 1.0–3.6)
LYMPHS PCT: 83 %
MCH: 35.2 pg — AB (ref 26.0–34.0)
MCHC: 33.3 g/dL (ref 32.0–36.0)
MCV: 105.9 fL — AB (ref 80.0–100.0)
Monocytes Absolute: 0.7 10*3/uL (ref 0.2–1.0)
Monocytes Relative: 3 %
NEUTROS ABS: 3.1 10*3/uL (ref 1.4–6.5)
NEUTROS PCT: 13 %
PLATELETS: 131 10*3/uL — AB (ref 150–440)
RBC: 3.7 MIL/uL — AB (ref 4.40–5.90)
RDW: 13.2 % (ref 11.5–14.5)
WBC: 23.8 10*3/uL — AB (ref 3.8–10.6)

## 2016-12-29 LAB — IRON AND TIBC
Iron: 108 ug/dL (ref 45–182)
SATURATION RATIOS: 31 % (ref 17.9–39.5)
TIBC: 353 ug/dL (ref 250–450)
UIBC: 245 ug/dL

## 2016-12-29 LAB — LACTATE DEHYDROGENASE: LDH: 123 U/L (ref 98–192)

## 2016-12-29 LAB — FERRITIN: FERRITIN: 17 ng/mL — AB (ref 24–336)

## 2016-12-29 MED ORDER — FERROUS SULFATE 325 (65 FE) MG PO TBEC: 325.0000 mg | DELAYED_RELEASE_TABLET | Freq: Three times a day (TID) | ORAL | 0 refills | Status: DC

## 2016-12-29 NOTE — Progress Notes (Signed)
Here for follow up

## 2016-12-30 ENCOUNTER — Other Ambulatory Visit: Payer: Self-pay | Admitting: *Deleted

## 2016-12-30 DIAGNOSIS — E611 Iron deficiency: Secondary | ICD-10-CM

## 2017-01-06 ENCOUNTER — Inpatient Hospital Stay: Payer: Medicare Other

## 2017-01-06 VITALS — BP 109/60 | HR 61 | Temp 95.6°F | Resp 20

## 2017-01-06 DIAGNOSIS — C9110 Chronic lymphocytic leukemia of B-cell type not having achieved remission: Secondary | ICD-10-CM | POA: Diagnosis not present

## 2017-01-06 DIAGNOSIS — E611 Iron deficiency: Secondary | ICD-10-CM

## 2017-01-06 MED ORDER — IRON SUCROSE 20 MG/ML IV SOLN
200.0000 mg | Freq: Once | INTRAVENOUS | Status: AC
  Administered 2017-01-06: 200 mg via INTRAVENOUS
  Filled 2017-01-06: qty 10

## 2017-01-06 MED ORDER — SODIUM CHLORIDE 0.9 % IV SOLN
Freq: Once | INTRAVENOUS | Status: AC
  Administered 2017-01-06: 14:00:00 via INTRAVENOUS
  Filled 2017-01-06: qty 1000

## 2017-01-13 ENCOUNTER — Inpatient Hospital Stay: Payer: Medicare Other

## 2017-01-13 VITALS — BP 108/68 | HR 70

## 2017-01-13 DIAGNOSIS — C9110 Chronic lymphocytic leukemia of B-cell type not having achieved remission: Secondary | ICD-10-CM | POA: Diagnosis not present

## 2017-01-13 DIAGNOSIS — E611 Iron deficiency: Secondary | ICD-10-CM

## 2017-01-13 MED ORDER — SODIUM CHLORIDE 0.9 % IV SOLN
Freq: Once | INTRAVENOUS | Status: AC
  Administered 2017-01-13: 14:00:00 via INTRAVENOUS
  Filled 2017-01-13: qty 1000

## 2017-01-13 MED ORDER — IRON SUCROSE 20 MG/ML IV SOLN
200.0000 mg | Freq: Once | INTRAVENOUS | Status: AC
  Administered 2017-01-13: 200 mg via INTRAVENOUS
  Filled 2017-01-13: qty 10

## 2017-01-20 ENCOUNTER — Inpatient Hospital Stay: Payer: Medicare Other | Attending: Oncology

## 2017-01-20 VITALS — BP 102/59 | HR 60 | Temp 97.1°F | Resp 18

## 2017-01-20 DIAGNOSIS — I10 Essential (primary) hypertension: Secondary | ICD-10-CM | POA: Insufficient documentation

## 2017-01-20 DIAGNOSIS — K219 Gastro-esophageal reflux disease without esophagitis: Secondary | ICD-10-CM | POA: Insufficient documentation

## 2017-01-20 DIAGNOSIS — Z85828 Personal history of other malignant neoplasm of skin: Secondary | ICD-10-CM | POA: Insufficient documentation

## 2017-01-20 DIAGNOSIS — Z7982 Long term (current) use of aspirin: Secondary | ICD-10-CM | POA: Diagnosis not present

## 2017-01-20 DIAGNOSIS — D509 Iron deficiency anemia, unspecified: Secondary | ICD-10-CM | POA: Diagnosis not present

## 2017-01-20 DIAGNOSIS — D539 Nutritional anemia, unspecified: Secondary | ICD-10-CM | POA: Insufficient documentation

## 2017-01-20 DIAGNOSIS — D696 Thrombocytopenia, unspecified: Secondary | ICD-10-CM | POA: Diagnosis not present

## 2017-01-20 DIAGNOSIS — Z79899 Other long term (current) drug therapy: Secondary | ICD-10-CM | POA: Insufficient documentation

## 2017-01-20 DIAGNOSIS — Z8546 Personal history of malignant neoplasm of prostate: Secondary | ICD-10-CM | POA: Diagnosis not present

## 2017-01-20 DIAGNOSIS — K449 Diaphragmatic hernia without obstruction or gangrene: Secondary | ICD-10-CM | POA: Diagnosis not present

## 2017-01-20 DIAGNOSIS — E611 Iron deficiency: Secondary | ICD-10-CM

## 2017-01-20 DIAGNOSIS — C9110 Chronic lymphocytic leukemia of B-cell type not having achieved remission: Secondary | ICD-10-CM | POA: Insufficient documentation

## 2017-01-20 MED ORDER — SODIUM CHLORIDE 0.9 % IV SOLN
Freq: Once | INTRAVENOUS | Status: AC
  Administered 2017-01-20: 14:00:00 via INTRAVENOUS
  Filled 2017-01-20: qty 1000

## 2017-01-20 MED ORDER — IRON SUCROSE 20 MG/ML IV SOLN
200.0000 mg | Freq: Once | INTRAVENOUS | Status: AC
  Administered 2017-01-20: 200 mg via INTRAVENOUS
  Filled 2017-01-20: qty 10

## 2017-01-27 ENCOUNTER — Ambulatory Visit: Payer: Medicare Other

## 2017-02-03 ENCOUNTER — Inpatient Hospital Stay: Payer: Medicare Other

## 2017-02-03 VITALS — BP 116/63 | HR 64 | Temp 98.7°F | Resp 20

## 2017-02-03 DIAGNOSIS — C9110 Chronic lymphocytic leukemia of B-cell type not having achieved remission: Secondary | ICD-10-CM | POA: Diagnosis not present

## 2017-02-03 DIAGNOSIS — E611 Iron deficiency: Secondary | ICD-10-CM

## 2017-02-03 MED ORDER — SODIUM CHLORIDE 0.9 % IV SOLN
Freq: Once | INTRAVENOUS | Status: AC
  Administered 2017-02-03: 14:00:00 via INTRAVENOUS
  Filled 2017-02-03: qty 1000

## 2017-02-03 MED ORDER — IRON SUCROSE 20 MG/ML IV SOLN
200.0000 mg | Freq: Once | INTRAVENOUS | Status: AC
  Administered 2017-02-03: 200 mg via INTRAVENOUS
  Filled 2017-02-03: qty 10

## 2017-02-11 ENCOUNTER — Inpatient Hospital Stay: Payer: Medicare Other

## 2017-02-11 DIAGNOSIS — C9110 Chronic lymphocytic leukemia of B-cell type not having achieved remission: Secondary | ICD-10-CM | POA: Diagnosis not present

## 2017-02-11 DIAGNOSIS — E611 Iron deficiency: Secondary | ICD-10-CM

## 2017-02-11 LAB — CBC WITH DIFFERENTIAL/PLATELET
BASOS ABS: 0.1 10*3/uL (ref 0–0.1)
BASOS PCT: 0 %
EOS ABS: 0.1 10*3/uL (ref 0–0.7)
EOS PCT: 0 %
HCT: 42.3 % (ref 40.0–52.0)
Hemoglobin: 14 g/dL (ref 13.0–18.0)
LYMPHS ABS: 14.5 10*3/uL — AB (ref 1.0–3.6)
Lymphocytes Relative: 61 %
MCH: 34.6 pg — AB (ref 26.0–34.0)
MCHC: 33.1 g/dL (ref 32.0–36.0)
MCV: 104.6 fL — ABNORMAL HIGH (ref 80.0–100.0)
Monocytes Absolute: 0.7 10*3/uL (ref 0.2–1.0)
Monocytes Relative: 3 %
Neutro Abs: 8.5 10*3/uL — ABNORMAL HIGH (ref 1.4–6.5)
Neutrophils Relative %: 36 %
PLATELETS: 133 10*3/uL — AB (ref 150–440)
RBC: 4.04 MIL/uL — AB (ref 4.40–5.90)
RDW: 12.8 % (ref 11.5–14.5)
WBC: 23.8 10*3/uL — AB (ref 3.8–10.6)

## 2017-02-11 LAB — IRON AND TIBC
IRON: 106 ug/dL (ref 45–182)
Saturation Ratios: 41 % — ABNORMAL HIGH (ref 17.9–39.5)
TIBC: 261 ug/dL (ref 250–450)
UIBC: 155 ug/dL

## 2017-02-11 LAB — FERRITIN: FERRITIN: 117 ng/mL (ref 24–336)

## 2017-02-16 ENCOUNTER — Ambulatory Visit: Payer: Medicare Other | Admitting: Anesthesiology

## 2017-02-16 ENCOUNTER — Ambulatory Visit
Admission: RE | Admit: 2017-02-16 | Discharge: 2017-02-16 | Disposition: A | Discharge status: Home / Self Care | Payer: Medicare Other | Source: Ambulatory Visit | Attending: Unknown Physician Specialty | Admitting: Unknown Physician Specialty

## 2017-02-16 ENCOUNTER — Encounter: Payer: Self-pay | Admitting: Anesthesiology

## 2017-02-16 ENCOUNTER — Encounter
Admission: RE | Disposition: A | Discharge status: Home / Self Care | Payer: Self-pay | Source: Ambulatory Visit | Attending: Unknown Physician Specialty

## 2017-02-16 DIAGNOSIS — K640 First degree hemorrhoids: Secondary | ICD-10-CM | POA: Diagnosis not present

## 2017-02-16 DIAGNOSIS — D509 Iron deficiency anemia, unspecified: Secondary | ICD-10-CM | POA: Diagnosis not present

## 2017-02-16 DIAGNOSIS — K5730 Diverticulosis of large intestine without perforation or abscess without bleeding: Secondary | ICD-10-CM | POA: Diagnosis not present

## 2017-02-16 DIAGNOSIS — K2950 Unspecified chronic gastritis without bleeding: Secondary | ICD-10-CM | POA: Diagnosis not present

## 2017-02-16 DIAGNOSIS — C61 Malignant neoplasm of prostate: Secondary | ICD-10-CM | POA: Insufficient documentation

## 2017-02-16 DIAGNOSIS — K219 Gastro-esophageal reflux disease without esophagitis: Secondary | ICD-10-CM | POA: Insufficient documentation

## 2017-02-16 DIAGNOSIS — I10 Essential (primary) hypertension: Secondary | ICD-10-CM | POA: Insufficient documentation

## 2017-02-16 DIAGNOSIS — K2980 Duodenitis without bleeding: Secondary | ICD-10-CM | POA: Diagnosis not present

## 2017-02-16 DIAGNOSIS — Z09 Encounter for follow-up examination after completed treatment for conditions other than malignant neoplasm: Secondary | ICD-10-CM | POA: Diagnosis present

## 2017-02-16 DIAGNOSIS — Z7982 Long term (current) use of aspirin: Secondary | ICD-10-CM | POA: Insufficient documentation

## 2017-02-16 DIAGNOSIS — Z8719 Personal history of other diseases of the digestive system: Secondary | ICD-10-CM | POA: Diagnosis not present

## 2017-02-16 DIAGNOSIS — E039 Hypothyroidism, unspecified: Secondary | ICD-10-CM | POA: Diagnosis not present

## 2017-02-16 DIAGNOSIS — K319 Disease of stomach and duodenum, unspecified: Secondary | ICD-10-CM | POA: Diagnosis not present

## 2017-02-16 DIAGNOSIS — Z888 Allergy status to other drugs, medicaments and biological substances status: Secondary | ICD-10-CM | POA: Diagnosis not present

## 2017-02-16 DIAGNOSIS — Z79899 Other long term (current) drug therapy: Secondary | ICD-10-CM | POA: Insufficient documentation

## 2017-02-16 DIAGNOSIS — D124 Benign neoplasm of descending colon: Secondary | ICD-10-CM | POA: Insufficient documentation

## 2017-02-16 DIAGNOSIS — D128 Benign neoplasm of rectum: Secondary | ICD-10-CM | POA: Insufficient documentation

## 2017-02-16 DIAGNOSIS — Z885 Allergy status to narcotic agent status: Secondary | ICD-10-CM | POA: Insufficient documentation

## 2017-02-16 DIAGNOSIS — K449 Diaphragmatic hernia without obstruction or gangrene: Secondary | ICD-10-CM | POA: Diagnosis not present

## 2017-02-16 HISTORY — DX: Hypothyroidism, unspecified: E03.9

## 2017-02-16 HISTORY — PX: ESOPHAGOGASTRODUODENOSCOPY (EGD) WITH PROPOFOL: SHX5813

## 2017-02-16 HISTORY — PX: COLONOSCOPY WITH PROPOFOL: SHX5780

## 2017-02-16 SURGERY — COLONOSCOPY WITH PROPOFOL
Anesthesia: General

## 2017-02-16 MED ORDER — PROPOFOL 500 MG/50ML IV EMUL
INTRAVENOUS | Status: AC
  Filled 2017-02-16: qty 50

## 2017-02-16 MED ORDER — BUTAMBEN-TETRACAINE-BENZOCAINE 2-2-14 % EX AERO
INHALATION_SPRAY | CUTANEOUS | Status: AC
  Filled 2017-02-16: qty 5

## 2017-02-16 MED ORDER — EPHEDRINE SULFATE 50 MG/ML IJ SOLN
INTRAMUSCULAR | Status: DC | PRN
  Administered 2017-02-16 (×3): 10 mg via INTRAVENOUS

## 2017-02-16 MED ORDER — FENTANYL CITRATE (PF) 100 MCG/2ML IJ SOLN
INTRAMUSCULAR | Status: DC | PRN
  Administered 2017-02-16: 50 ug via INTRAVENOUS

## 2017-02-16 MED ORDER — LIDOCAINE HCL (CARDIAC) 20 MG/ML IV SOLN
INTRAVENOUS | Status: DC | PRN
  Administered 2017-02-16: 30 mg via INTRAVENOUS

## 2017-02-16 MED ORDER — PROPOFOL 500 MG/50ML IV EMUL
INTRAVENOUS | Status: DC | PRN
  Administered 2017-02-16: 100 ug/kg/min via INTRAVENOUS

## 2017-02-16 MED ORDER — LIDOCAINE HCL (PF) 2 % IJ SOLN
INTRAMUSCULAR | Status: AC
  Filled 2017-02-16: qty 10

## 2017-02-16 MED ORDER — SODIUM CHLORIDE 0.9 % IV SOLN
INTRAVENOUS | Status: DC
  Administered 2017-02-16: 08:00:00 via INTRAVENOUS

## 2017-02-16 MED ORDER — BUTAMBEN-TETRACAINE-BENZOCAINE 2-2-14 % EX AERO
INHALATION_SPRAY | CUTANEOUS | Status: DC | PRN
  Administered 2017-02-16: 2 via TOPICAL

## 2017-02-16 MED ORDER — PHENYLEPHRINE HCL 10 MG/ML IJ SOLN
INTRAMUSCULAR | Status: DC | PRN
  Administered 2017-02-16 (×2): 50 ug via INTRAVENOUS

## 2017-02-16 MED ORDER — FENTANYL CITRATE (PF) 100 MCG/2ML IJ SOLN
INTRAMUSCULAR | Status: AC
  Filled 2017-02-16: qty 2

## 2017-02-16 MED ORDER — PIPERACILLIN-TAZOBACTAM 3.375 G IVPB
3.3750 g | Freq: Once | INTRAVENOUS | Status: AC
  Administered 2017-02-16: 3.375 g via INTRAVENOUS

## 2017-02-16 MED ORDER — PIPERACILLIN-TAZOBACTAM 3.375 G IVPB
INTRAVENOUS | Status: AC
  Filled 2017-02-16: qty 50

## 2017-02-16 MED ORDER — EPHEDRINE SULFATE 50 MG/ML IJ SOLN
INTRAMUSCULAR | Status: AC
  Filled 2017-02-16: qty 1

## 2017-02-16 NOTE — Anesthesia Preprocedure Evaluation (Signed)
Anesthesia Evaluation  Patient identified by MRN, date of birth, ID band Patient awake    Reviewed: Allergy & Precautions, NPO status , Patient's Chart, lab work & pertinent test results, reviewed documented beta blocker date and time   History of Anesthesia Complications (+) history of anesthetic complications  Airway Mallampati: III  TM Distance: >3 FB     Dental  (+) Chipped   Pulmonary           Cardiovascular hypertension, Pt. on medications + dysrhythmias      Neuro/Psych    GI/Hepatic hiatal hernia, GERD  ,  Endo/Other    Renal/GU      Musculoskeletal   Abdominal   Peds  Hematology  (+) anemia ,   Anesthesia Other Findings   Reproductive/Obstetrics                             Anesthesia Physical Anesthesia Plan  ASA: III  Anesthesia Plan: General   Post-op Pain Management:    Induction: Intravenous  PONV Risk Score and Plan:   Airway Management Planned:   Additional Equipment:   Intra-op Plan:   Post-operative Plan:   Informed Consent: I have reviewed the patients History and Physical, chart, labs and discussed the procedure including the risks, benefits and alternatives for the proposed anesthesia with the patient or authorized representative who has indicated his/her understanding and acceptance.     Plan Discussed with: CRNA  Anesthesia Plan Comments:         Anesthesia Quick Evaluation

## 2017-02-16 NOTE — H&P (Signed)
Primary Care Physician:  Tracie Harrier, MD Primary Gastroenterologist:  Dr. Vira Agar  Pre-Procedure History & Physical: HPI:  Allen Chang is a 83 y.o. male is here for an colonoscopy and upper endoscopy   Past Medical History:  Diagnosis Date  . Cancer [facility])    prostate   . Complication of anesthesia    bradycardia, in ICU for 24 hour after galbladder surgery.   . Diverticulosis 2012  . Dysrhythmia    Heart skips a beat  . Elevated lipids   . GERD (gastroesophageal reflux disease)   . History of hiatal hernia   . Hypertension   . Hypothyroidism   . Skin cancer    face, scalp, behind ear,back and hand  . Tremors of nervous system     Past Surgical History:  Procedure Laterality Date  . BLADDER TUMOR EXCISION    . CARDIAC CATHETERIZATION    . CHOLECYSTECTOMY N/A 10/10/2014   Procedure: LAPAROSCOPIC CHOLECYSTECTOMY WITH INTRAOPERATIVE CHOLANGIOGRAM;  Surgeon: Leonie Green, MD;  Location: [facility] ORS;  Service: General;  Laterality: N/A;  . HEMORRHOID SURGERY    . HERNIA REPAIR Left    x2  . JOINT REPLACEMENT Left    Partial Knee Replacement, Dr. Roland Rack  . PARTIAL KNEE ARTHROPLASTY Left 12/12/2014   Procedure: UNICOMPARTMENTAL KNEE;  Surgeon: Corky Mull, MD;  Location: [facility] ORS;  Service: Orthopedics;  Laterality: Left;  . prostate seeding    . SHOULDER ARTHROSCOPY Right   . SHOULDER ARTHROSCOPY WITH OPEN ROTATOR CUFF REPAIR Left 02/07/2016   Procedure: SHOULDER ARTHROSCOPY WITH OPEN ROTATOR CUFF REPAIR;  Surgeon: Corky Mull, MD;  Location: [facility] ORS;  Service: Orthopedics;  Laterality: Left;    Prior to Admission medications   Medication Sig Start Date End Date Taking? Authorizing Provider  lisinopril (PRINIVIL,ZESTRIL) 20 MG tablet Take 10 mg by mouth daily. In am   Yes [provider]  aspirin 81 MG tablet Take 81 mg by mouth daily. In am    [provider]  docusate sodium (COLACE) 100 MG capsule Take 200 mg by mouth daily.      [provider]  ferrous sulfate 325 (65 FE) MG EC tablet Take 1 tablet (325 mg total) 3 (three) times daily with meals by mouth. 12/29/16   [name], MD  Flaxseed, Linseed, (FLAX SEED OIL) 1000 MG CAPS Take 1 capsule by mouth daily.    [provider]  Garlic 3810 MG CAPS Take 1 capsule by mouth every morning.    [provider]  latanoprost (XALATAN) 0.005 % ophthalmic solution Place 1 drop into both eyes at bedtime.    [provider]  levothyroxine (SYNTHROID, LEVOTHROID) 25 MCG tablet Take 25 mcg by mouth daily at 12 noon. 04/02/16 04/02/17  [provider]  loratadine (CLARITIN REDITABS) 10 MG dissolvable tablet Take 10 mg by mouth daily. In am    [provider]  Misc Natural Products (BLACK CHERRY CONCENTRATE) LIQD Take 15 mLs by mouth daily.     [provider]  Multiple Vitamins-Minerals (MULTIVITAMIN WITH MINERALS) tablet Take 1 tablet by mouth daily. In am    [provider]  Omega-3 Fatty Acids (FISH OIL) 1000 MG CAPS Take 1 capsule by mouth daily.    [provider]  omeprazole (PRILOSEC) 20 MG capsule Take 20 mg by mouth daily.     [provider]  polyethylene glycol (MIRALAX / GLYCOLAX) packet Take 17 g by mouth daily.    [provider]  primidone (MYSOLINE) 50 MG tablet Take 50 mg by mouth 2 (two) times daily.    [provider]    Allergies as of 02/04/2017 - Review Complete 12/29/2016  Allergen Reaction Noted  . Percocet [oxycodone-acetaminophen] Other (See Comments) 10/09/2014  . Voltaren [diclofenac sodium] Palpitations 09/29/2014    Family History  Problem Relation Age of Onset  . Bone cancer Father   . Hypertension Mother   . Osteoporosis Mother   . Breast cancer Sister   . Cancer Brother   . Cancer Brother   . Lung cancer Brother   . Bladder Cancer Brother   . Melanoma Brother     Social History   Socioeconomic History  . Marital status: Married     Spouse name: Not on file  . Number of children: Not on file  . Years of education: Not on file  . Highest education level: Not on file  Social Needs  . Financial resource strain: Not on file  . Food insecurity - worry: Not on file  . Food insecurity - inability: Not on file  . Transportation needs - medical: Not on file  . Transportation needs - non-medical: Not on file  Occupational History  . Not on file  Tobacco Use  . Smoking status: Never Smoker  . Smokeless tobacco: Never Used  Substance and Sexual Activity  . Alcohol use: No  . Drug use: No  . Sexual activity: Yes  Other Topics Concern  . Not on file  Social History Narrative  . Not on file    Review of Systems: See HPI, otherwise negative ROS  Physical Exam: There were no vitals taken for this visit. General:   Alert,  pleasant and cooperative in NAD Head:  Normocephalic and atraumatic. Neck:  Supple; no masses or thyromegaly. Lungs:  Clear throughout to auscultation.    Heart:  Regular rate and rhythm. Abdomen:  Soft, nontender and nondistended. Normal bowel sounds, without guarding, and without rebound.   Neurologic:  Alert and  oriented x4;  grossly normal neurologically.  Impression/Plan: Allen Chang is here for an colonoscopy to be performed for colonoscopy and EGD  Risks, benefits, limitations, and alternatives regarding  endoscopy and colonoscopy have been reviewed with the patient.  Questions have been answered.  All parties agreeable.   Gaylyn Cheers, MD  02/16/2017, 7:29 AM

## 2017-02-16 NOTE — Anesthesia Postprocedure Evaluation (Signed)
Anesthesia Post Note  Patient: Allen Chang  Procedure(s) Performed: COLONOSCOPY WITH PROPOFOL (N/A ) ESOPHAGOGASTRODUODENOSCOPY (EGD) WITH PROPOFOL (N/A )  Patient location during evaluation: Endoscopy Anesthesia Type: General Level of consciousness: awake and alert Pain management: pain level controlled Vital Signs Assessment: post-procedure vital signs reviewed and stable Respiratory status: spontaneous breathing, nonlabored ventilation, respiratory function stable and patient connected to nasal cannula oxygen Cardiovascular status: blood pressure returned to baseline and stable Postop Assessment: no apparent nausea or vomiting Anesthetic complications: no     Last Vitals:  Vitals:   02/16/17 0857 02/16/17 0907  BP:  (!) 99/44  Pulse:    Resp:    Temp: (!) 36.1 C   SpO2: 100% 100%    Last Pain:  Vitals:   02/16/17 0927  TempSrc:   PainSc: 0-No pain                 Nolie Bignell S

## 2017-02-16 NOTE — Op Note (Signed)
[facility] Gastroenterology Patient Name: Allen Chang Procedure Date: 02/16/2017 8:06 AM MRN: [id number] Account #: [id number] Date of Birth: [birth year] Admit Type: Outpatient Age: 83 Room: [facility] ENDO ROOM 3 Gender: Male Note Status: Finalized Procedure:            Upper GI endoscopy Indications:          Unexplained iron deficiency anemia Providers:            Manya Silvas, MD Referring MD:         Tracie Harrier, MD (Referring MD) Medicines:            Propofol per Anesthesia Complications:        No immediate complications. Procedure:            Pre-Anesthesia Assessment:                       - After reviewing the risks and benefits, the patient                        was deemed in satisfactory condition to undergo the                        procedure.                       After obtaining informed consent, the endoscope was                        passed under direct vision. Throughout the procedure,                        the patient's blood pressure, pulse, and oxygen                        saturations were monitored continuously. The Endoscope                        was introduced through the mouth, and advanced to the                        second part of duodenum. The upper GI endoscopy was                        accomplished without difficulty. The patient tolerated                        the procedure well. Findings:      The examined esophagus was normal. GEJ 34cm.      A medium-sized hiatal hernia was present.      Two, small non-bleeding healing erosions were found in the gastric       antrum. There were no stigmata of recent bleeding. Biopsies were taken       with a cold forceps for histology. Biopsies were taken with a cold       forceps for Helicobacter pylori testing.      A few small nodules with a localized distribution were found in the       duodenal bulb. Biopsies were taken with a cold forceps for histology.       Second  portion normal. Impression:           -  Normal esophagus.                       - Medium-sized hiatal hernia.                       - Non-bleeding erosive gastropathy. Biopsied.                       - Nodule found in the duodenum. Biopsied. Recommendation:       - Await pathology results.                       - Perform a colonoscopy as previously scheduled. Manya Silvas, MD 02/16/2017 8:26:45 AM This report has been signed electronically. Number of Addenda: 0 Note Initiated On: 02/16/2017 8:06 AM      [facility]

## 2017-02-16 NOTE — Anesthesia Procedure Notes (Signed)
Performed by: Cook-Martin, Sibley Rolison Pre-anesthesia Checklist: Patient identified, Emergency Drugs available, Suction available, Patient being monitored and Timeout performed Patient Re-evaluated:Patient Re-evaluated prior to induction Oxygen Delivery Method: Nasal cannula Preoxygenation: Pre-oxygenation with 100% oxygen Induction Type: IV induction Placement Confirmation: positive ETCO2 and CO2 detector       

## 2017-02-16 NOTE — Op Note (Signed)
[facility] Gastroenterology Patient Name: Allen Chang Procedure Date: 02/16/2017 8:06 AM MRN: [id number] Account #: [id number] Date of Birth: 08/04/1932 Admit Type: Outpatient Age: 83 Room: [facility] ENDO ROOM 3 Gender: Male Note Status: Finalized Procedure:            Colonoscopy Indications:          High risk colon cancer surveillance: Personal history                        of colonic polyps Providers:            Manya Silvas, MD Referring MD:         Tracie Harrier, MD (Referring MD) Medicines:            Propofol per Anesthesia Complications:        No immediate complications. Procedure:            Pre-Anesthesia Assessment:                       - After reviewing the risks and benefits, the patient                        was deemed in satisfactory condition to undergo the                        procedure.                       After obtaining informed consent, the colonoscope was                        passed under direct vision. Throughout the procedure,                        the patient's blood pressure, pulse, and oxygen                        saturations were monitored continuously. The                        Colonoscope was introduced through the anus and                        advanced to the the cecum, identified by appendiceal                        orifice and ileocecal valve. The colonoscopy was                        performed without difficulty. The patient tolerated the                        procedure well. The quality of the bowel preparation                        was adequate to identify polyps. Findings:      Three sessile polyps were found in the rectum. The polyps were       diminutive in size. These polyps were removed with a jumbo cold forceps.       Resection and retrieval were complete.      A  diminutive polyp was found in the descending colon. The polyp was       sessile. The polyp was removed with a jumbo cold forceps.  Resection and       retrieval were complete.      Many medium-mouthed diverticula were found in the sigmoid colon,       descending colon, transverse colon and ascending colon.      Internal hemorrhoids were found during endoscopy. The hemorrhoids were       small and Grade I (internal hemorrhoids that do not prolapse).      The exam was otherwise without abnormality. Impression:           - Three diminutive polyps in the rectum, removed with a                        jumbo cold forceps. Resected and retrieved.                       - One diminutive polyp in the descending colon, removed                        with a jumbo cold forceps. Resected and retrieved.                       - Diverticulosis in the sigmoid colon, in the                        descending colon, in the transverse colon and in the                        ascending colon.                       - Internal hemorrhoids.                       - The examination was otherwise normal. Recommendation:       - Await pathology results. Manya Silvas, MD 02/16/2017 8:57:07 AM This report has been signed electronically. Number of Addenda: 0 Note Initiated On: 02/16/2017 8:06 AM Scope Withdrawal Time: 0 hours 9 minutes 20 seconds  Total Procedure Duration: 0 hours 23 minutes 1 second       [facility]

## 2017-02-16 NOTE — Transfer of Care (Signed)
Immediate Anesthesia Transfer of Care Note  Patient: Allen Chang  Procedure(s) Performed: COLONOSCOPY WITH PROPOFOL (N/A ) ESOPHAGOGASTRODUODENOSCOPY (EGD) WITH PROPOFOL (N/A )  Patient Location: PACU  Anesthesia Type:General  Level of Consciousness: awake and sedated  Airway & Oxygen Therapy: Patient Spontanous Breathing and Patient connected to nasal cannula oxygen  Post-op Assessment: Report given to RN and Post -op Vital signs reviewed and stable  Post vital signs: Reviewed and stable  Last Vitals:  Vitals:   02/16/17 0735  BP: 139/69  Pulse: 61  Resp: 16  Temp: (!) 35.6 C  SpO2: 100%    Last Pain:  Vitals:   02/16/17 0735  TempSrc: Tympanic  PainSc: 0-No pain         Complications: No apparent anesthesia complications

## 2017-02-16 NOTE — Anesthesia Post-op Follow-up Note (Signed)
Anesthesia QCDR form completed.        

## 2017-02-18 ENCOUNTER — Inpatient Hospital Stay: Payer: Medicare Other | Admitting: Oncology

## 2017-02-18 ENCOUNTER — Inpatient Hospital Stay: Payer: Medicare Other

## 2017-02-18 ENCOUNTER — Encounter: Payer: Self-pay | Admitting: Unknown Physician Specialty

## 2017-02-18 LAB — SURGICAL PATHOLOGY

## 2017-02-24 NOTE — Progress Notes (Signed)
Hematology/Oncology Follow Up Note [facility] Telephone:([phone] [phone]  CONSULT NOTE Patient Care Team: Tracie Harrier, MD as PCP - General (Internal Medicine)  REASON FOR VISIT Follow up for treatment of CLL and iron deficiency anemia   HISTORY OF PRESENTING ILLNESS:  Allen Chang 84 y.o.  male with PMH listed as below who was referred by primary care provider to me for evaluation of leukocytosis. He has a history of prostate cancer which was diagnosed 10 years ago. He underwent brachytherapy. He is not any castration treatment. Per patient, he follows up with Dr.Wolf and most recent PSA is normal.   Patient also reports feeling fatigue lately. Recent labs show leukocytosis, mild anemia, macrocytosis, mild thrombocytopenia, low ferritin. Denies blood in the stool. He was started on over the counter iron supplement but wife who manages patient's medication decides to only let patient to take iron medication every other day.    INTERVAL HISTORY Allen Chang is a 84 y.o. male who has above history reviewed by me today presents for follow up visit for management of CLL and iron deficiency anemia.  Problems and complaints are listed below: During interval he has received IV iron Venofer x 4. Colonoscopy was done on 02/16/2017 which showed polyps, internal hemorrhoids, negative for malignancy. Patient feels that her energy level has become better. He is able to do more formal work.   ROS:  Review of Systems  Constitutional: Negative for chills and fatigue.  HENT:   Negative for lump/mass and nosebleeds.   Eyes: Negative for eye problems.  Respiratory: Negative for chest tightness and cough.   Cardiovascular: Negative for chest pain and leg swelling.  Gastrointestinal: Negative.  Negative for abdominal distention.  Endocrine: Negative for hot flashes.  Genitourinary: Negative for difficulty urinating and dysuria.   Musculoskeletal:  Negative for arthralgias and back pain.  Skin: Negative for itching.       Skin cancer, recently got topical chemotherapy.  Neurological: Negative for dizziness and headaches.  Psychiatric/Behavioral: The patient is not nervous/anxious.     MEDICAL HISTORY:  Past Medical History:  Diagnosis Date  . Cancer [facility])    prostate   . Complication of anesthesia    bradycardia, in ICU for 24 hour after galbladder surgery.   . Diverticulosis 2012  . Dysrhythmia    Heart skips a beat  . Elevated lipids   . GERD (gastroesophageal reflux disease)   . History of hiatal hernia   . Hypertension   . Hypothyroidism   . Skin cancer    face, scalp, behind ear,back and hand  . Tremors of nervous system     SURGICAL HISTORY: Past Surgical History:  Procedure Laterality Date  . BLADDER TUMOR EXCISION    . CARDIAC CATHETERIZATION    . CHOLECYSTECTOMY N/A 10/10/2014   Procedure: LAPAROSCOPIC CHOLECYSTECTOMY WITH INTRAOPERATIVE CHOLANGIOGRAM;  Surgeon: Leonie Green, MD;  Location: [facility] ORS;  Service: General;  Laterality: N/A;  . COLONOSCOPY WITH PROPOFOL N/A 02/16/2017   Procedure: COLONOSCOPY WITH PROPOFOL;  Surgeon: Manya Silvas, MD;  Location[facility] ENDOSCOPY;  Service: Endoscopy;  Laterality: N/A;  . ESOPHAGOGASTRODUODENOSCOPY (EGD) WITH PROPOFOL N/A 02/16/2017   Procedure: ESOPHAGOGASTRODUODENOSCOPY (EGD) WITH PROPOFOL;  Surgeon: Manya Silvas, MD;  Location: [facility] ENDOSCOPY;  Service: Endoscopy;  Laterality: N/A;  . HEMORRHOID SURGERY    . HERNIA REPAIR Left    x2  . JOINT REPLACEMENT Left    Partial Knee Replacement, Dr. Roland Rack  . PARTIAL KNEE ARTHROPLASTY Left  12/12/2014   Procedure: UNICOMPARTMENTAL KNEE;  Surgeon: Corky Mull, MD;  Location: [facility] ORS;  Service: Orthopedics;  Laterality: Left;  . prostate seeding    . SHOULDER ARTHROSCOPY Right   . SHOULDER ARTHROSCOPY WITH OPEN ROTATOR CUFF REPAIR Left 02/07/2016   Procedure: SHOULDER ARTHROSCOPY WITH OPEN ROTATOR CUFF  REPAIR;  Surgeon: Corky Mull, MD;  Location: [facility] ORS;  Service: Orthopedics;  Laterality: Left;    SOCIAL HISTORY: Social History   Socioeconomic History  . Marital status: Married    Spouse name: Not on file  . Number of children: Not on file  . Years of education: Not on file  . Highest education level: Not on file  Social Needs  . Financial resource strain: Not on file  . Food insecurity - worry: Not on file  . Food insecurity - inability: Not on file  . Transportation needs - medical: Not on file  . Transportation needs - non-medical: Not on file  Occupational History  . Not on file  Tobacco Use  . Smoking status: Never Smoker  . Smokeless tobacco: Never Used  Substance and Sexual Activity  . Alcohol use: No  . Drug use: No  . Sexual activity: Yes  Other Topics Concern  . Not on file  Social History Narrative  . Not on file    FAMILY HISTORY: Family History  Problem Relation Age of Onset  . Bone cancer Father   . Hypertension Mother   . Osteoporosis Mother   . Breast cancer Sister   . Cancer Brother   . Cancer Brother   . Lung cancer Brother   . Bladder Cancer Brother   . Melanoma Brother     ALLERGIES:  is allergic to percocet [oxycodone-acetaminophen] and voltaren [diclofenac sodium].  MEDICATIONS:  Current Outpatient Medications  Medication Sig Dispense Refill  . aspirin 81 MG tablet Take 81 mg by mouth daily. In am    . docusate sodium (COLACE) 100 MG capsule Take 200 mg by mouth daily.     . Flaxseed, Linseed, (FLAX SEED OIL) 1000 MG CAPS Take 1 capsule by mouth daily.    . Garlic 2878 MG CAPS Take 1 capsule by mouth every morning.    . latanoprost (XALATAN) 0.005 % ophthalmic solution Place 1 drop into both eyes at bedtime.    Marland Kitchen levothyroxine (SYNTHROID, LEVOTHROID) 25 MCG tablet Take 25 mcg by mouth daily at 12 noon.    Marland Kitchen lisinopril (PRINIVIL,ZESTRIL) 20 MG tablet Take 10 mg by mouth daily. In am    . loratadine (CLARITIN REDITABS) 10 MG  dissolvable tablet Take 10 mg by mouth daily. In am    . Misc Natural Products (BLACK CHERRY CONCENTRATE) LIQD Take 15 mLs by mouth daily.     . Multiple Vitamins-Minerals (MULTIVITAMIN WITH MINERALS) tablet Take 1 tablet by mouth daily. In am    . Omega-3 Fatty Acids (FISH OIL) 1000 MG CAPS Take 1 capsule by mouth daily.    Marland Kitchen omeprazole (PRILOSEC) 20 MG capsule Take 20 mg by mouth daily.     . polyethylene glycol (MIRALAX / GLYCOLAX) packet Take 17 g by mouth daily.    . primidone (MYSOLINE) 50 MG tablet Take 50 mg by mouth 2 (two) times daily.    . ferrous sulfate 325 (65 FE) MG EC tablet Take 1 tablet (325 mg total) 3 (three) times daily with meals by mouth. (Patient not taking: Reported on 02/25/2017) 90 tablet 0   No current facility-administered medications for this  visit.       Marland Kitchen  PHYSICAL EXAMINATION: ECOG PERFORMANCE STATUS: 0 - Asymptomatic There were no vitals filed for this visit. There were no vitals filed for this visit. GENERAL: No distress, well nourished.  SKIN:  No rashes or significant lesions  HEAD: Normocephalic, No masses, lesions, tenderness or abnormalities  EYES: Conjunctiva are pink, non icteric ENT: External ears normal ,lips , buccal mucosa, and tongue normal and mucous membranes are moist  LYMPH: No palpable cervical and axillary lymphadenopathy  LUNGS: Clear to auscultation, no crackles or wheezes HEART: Regular rate & rhythm, no murmurs, no gallops, S1 normal and S2 normal  ABDOMEN: Abdomen soft, non-tender, normal bowel sounds, I did not appreciate any  masses or organomegaly  MUSCULOSKELETAL: No CVA tenderness and no tenderness on percussion of the back or rib cage.  EXTREMITIES: No edema, no skin discoloration or tenderness NEURO: Alert & oriented, no focal motor/sensory deficits.    LABORATORY DATA:  I have reviewed the data as listed Lab Results  Component Value Date   WBC 23.8 (H) 02/11/2017   HGB 14.0 02/11/2017   HCT 42.3 02/11/2017   MCV  104.6 (H) 02/11/2017   PLT 133 (L) 02/11/2017   Recent Labs    10/17/16 1510 12/29/16 1355  NA 137 137  K 4.9 5.1  CL 103 102  CO2 25 24  GLUCOSE 85 109*  BUN 27* 30*  CREATININE 1.64* 1.31*  CALCIUM 9.2 9.4  GFRNONAA 37* 49*  GFRAA 43* 57*  PROT 7.1 7.2  ALBUMIN 4.4 4.3  AST 27 23  ALT 22 19  ALKPHOS 61 67  BILITOT 0.4 0.7    RADIOGRAPHIC STUDIES: I have personally reviewed the radiological images as listed and agreed with the findings in the report. no recent images.  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 Results for CCND1/IGH, ATM, 13q and TP53 were normal.    CLL patients with trisomy 12 as the sole anomaly have an intermediate prognosis. However, recent studies have  shown that trisomy 12 in conjunction with a NOTCH1 mutation, present in about 20% of patients, represents a distinct  subgroup with poor overall survival (see references below). NOTCH1 mutation analysis is not presently available at  [place]:  1. CLL (chronic lymphocytic leukemia) ([place])   2. Iron deficiency   3. Macrocytic anemia   4. Thrombocytopenia ([place])    # CLL, Clinically Rai Stage 0. Prognostic panel showed Trisomy 12, intermediate risk.  US abdomen showed no organomegaly. Thrombocytopenia, >100,000, not associated with clinic risk.  negative hepatitis and HIV. continue watchful waiting, discuss with patient.   # Iron deficiency anemia,Ferritin improved after IV iron. Ferritin improved, as 117. Hold additional IV iron treatment   # Macrocytic anemia:  Normal folate and B12. ?MDS or CLL involvement. CLL appears to be stable, we'll hold bone marrow biopsy at this time..   All questions were  answered. The patient knows to call the clinic with any problems questions or concerns.  Return of visit: 3 months. With repeat CBC, iron and ferritin.  [name], MD, PhD Hematology Oncology [facility] Pager- [phone] 02/25/2017

## 2017-02-25 ENCOUNTER — Inpatient Hospital Stay: Payer: Medicare Other | Attending: Internal Medicine | Admitting: Oncology

## 2017-02-25 ENCOUNTER — Encounter: Payer: Self-pay | Admitting: Oncology

## 2017-02-25 ENCOUNTER — Inpatient Hospital Stay: Payer: Medicare Other

## 2017-02-25 VITALS — BP 107/61 | HR 71 | Temp 97.7°F | Resp 16 | Wt 174.0 lb

## 2017-02-25 DIAGNOSIS — Z85828 Personal history of other malignant neoplasm of skin: Secondary | ICD-10-CM | POA: Diagnosis not present

## 2017-02-25 DIAGNOSIS — E039 Hypothyroidism, unspecified: Secondary | ICD-10-CM | POA: Diagnosis not present

## 2017-02-25 DIAGNOSIS — D696 Thrombocytopenia, unspecified: Secondary | ICD-10-CM | POA: Diagnosis not present

## 2017-02-25 DIAGNOSIS — C9110 Chronic lymphocytic leukemia of B-cell type not having achieved remission: Secondary | ICD-10-CM | POA: Insufficient documentation

## 2017-02-25 DIAGNOSIS — Z79899 Other long term (current) drug therapy: Secondary | ICD-10-CM | POA: Insufficient documentation

## 2017-02-25 DIAGNOSIS — Z8546 Personal history of malignant neoplasm of prostate: Secondary | ICD-10-CM

## 2017-02-25 DIAGNOSIS — K449 Diaphragmatic hernia without obstruction or gangrene: Secondary | ICD-10-CM | POA: Diagnosis not present

## 2017-02-25 DIAGNOSIS — K219 Gastro-esophageal reflux disease without esophagitis: Secondary | ICD-10-CM | POA: Insufficient documentation

## 2017-02-25 DIAGNOSIS — D509 Iron deficiency anemia, unspecified: Secondary | ICD-10-CM

## 2017-02-25 DIAGNOSIS — Z7982 Long term (current) use of aspirin: Secondary | ICD-10-CM | POA: Diagnosis not present

## 2017-02-25 DIAGNOSIS — I10 Essential (primary) hypertension: Secondary | ICD-10-CM | POA: Diagnosis not present

## 2017-02-25 DIAGNOSIS — D539 Nutritional anemia, unspecified: Secondary | ICD-10-CM

## 2017-02-25 DIAGNOSIS — K648 Other hemorrhoids: Secondary | ICD-10-CM | POA: Insufficient documentation

## 2017-02-25 DIAGNOSIS — E611 Iron deficiency: Secondary | ICD-10-CM

## 2017-03-31 ENCOUNTER — Other Ambulatory Visit: Payer: Medicare Other

## 2017-03-31 ENCOUNTER — Emergency Department
Admission: EM | Admit: 2017-03-31 | Discharge: 2017-03-31 | Disposition: A | Discharge status: Home / Self Care | Payer: Medicare Other | Attending: Emergency Medicine | Admitting: Emergency Medicine

## 2017-03-31 ENCOUNTER — Ambulatory Visit: Payer: Medicare Other | Admitting: Oncology

## 2017-03-31 ENCOUNTER — Other Ambulatory Visit: Payer: Self-pay

## 2017-03-31 ENCOUNTER — Emergency Department: Payer: Medicare Other

## 2017-03-31 DIAGNOSIS — Z7982 Long term (current) use of aspirin: Secondary | ICD-10-CM | POA: Insufficient documentation

## 2017-03-31 DIAGNOSIS — Z85828 Personal history of other malignant neoplasm of skin: Secondary | ICD-10-CM | POA: Insufficient documentation

## 2017-03-31 DIAGNOSIS — E039 Hypothyroidism, unspecified: Secondary | ICD-10-CM | POA: Diagnosis not present

## 2017-03-31 DIAGNOSIS — I10 Essential (primary) hypertension: Secondary | ICD-10-CM | POA: Diagnosis not present

## 2017-03-31 DIAGNOSIS — Z8546 Personal history of malignant neoplasm of prostate: Secondary | ICD-10-CM | POA: Insufficient documentation

## 2017-03-31 DIAGNOSIS — R509 Fever, unspecified: Secondary | ICD-10-CM | POA: Diagnosis present

## 2017-03-31 DIAGNOSIS — J101 Influenza due to other identified influenza virus with other respiratory manifestations: Secondary | ICD-10-CM

## 2017-03-31 DIAGNOSIS — Z79899 Other long term (current) drug therapy: Secondary | ICD-10-CM | POA: Insufficient documentation

## 2017-03-31 LAB — URINALYSIS, ROUTINE W REFLEX MICROSCOPIC
BILIRUBIN URINE: NEGATIVE
Bacteria, UA: NONE SEEN
GLUCOSE, UA: NEGATIVE mg/dL
Hgb urine dipstick: NEGATIVE
Ketones, ur: 5 mg/dL — AB
LEUKOCYTES UA: NEGATIVE
NITRITE: NEGATIVE
PH: 5 (ref 5.0–8.0)
Protein, ur: 30 mg/dL — AB
SPECIFIC GRAVITY, URINE: 1.019 (ref 1.005–1.030)

## 2017-03-31 LAB — COMPREHENSIVE METABOLIC PANEL
ALK PHOS: 72 U/L (ref 38–126)
ALT: 27 U/L (ref 17–63)
AST: 35 U/L (ref 15–41)
Albumin: 3.7 g/dL (ref 3.5–5.0)
Anion gap: 10 (ref 5–15)
BUN: 18 mg/dL (ref 6–20)
CHLORIDE: 104 mmol/L (ref 101–111)
CO2: 22 mmol/L (ref 22–32)
CREATININE: 1.3 mg/dL — AB (ref 0.61–1.24)
Calcium: 8.4 mg/dL — ABNORMAL LOW (ref 8.9–10.3)
GFR calc Af Amer: 57 mL/min — ABNORMAL LOW (ref >60)
GFR calc non Af Amer: 49 mL/min — ABNORMAL LOW (ref >60)
Glucose, Bld: 124 mg/dL — ABNORMAL HIGH (ref 65–99)
Potassium: 3.8 mmol/L (ref 3.5–5.1)
SODIUM: 136 mmol/L (ref 135–145)
Total Bilirubin: 0.9 mg/dL (ref 0.3–1.2)
Total Protein: 6.6 g/dL (ref 6.5–8.1)

## 2017-03-31 LAB — CBC WITH DIFFERENTIAL/PLATELET
Basophils Absolute: 0.1 10*3/uL (ref 0–0.1)
Basophils Relative: 1 %
EOS ABS: 0 10*3/uL (ref 0–0.7)
EOS PCT: 0 %
HCT: 37.8 % — ABNORMAL LOW (ref 40.0–52.0)
Hemoglobin: 12.7 g/dL — ABNORMAL LOW (ref 13.0–18.0)
Lymphocytes Relative: 54 %
Lymphs Abs: 6.5 10*3/uL — ABNORMAL HIGH (ref 1.0–3.6)
MCH: 34.4 pg — ABNORMAL HIGH (ref 26.0–34.0)
MCHC: 33.6 g/dL (ref 32.0–36.0)
MCV: 102.2 fL — AB (ref 80.0–100.0)
MONO ABS: 0.5 10*3/uL (ref 0.2–1.0)
Monocytes Relative: 4 %
NEUTROS PCT: 41 %
Neutro Abs: 5 10*3/uL (ref 1.4–6.5)
PLATELETS: 107 10*3/uL — AB (ref 150–440)
RBC: 3.7 MIL/uL — AB (ref 4.40–5.90)
RDW: 12.9 % (ref 11.5–14.5)
WBC: 12.1 10*3/uL — ABNORMAL HIGH (ref 3.8–10.6)

## 2017-03-31 LAB — INFLUENZA PANEL BY PCR (TYPE A & B)
INFLBPCR: NEGATIVE
Influenza A By PCR: POSITIVE — AB

## 2017-03-31 LAB — LACTIC ACID, PLASMA: Lactic Acid, Venous: 1.4 mmol/L (ref 0.5–1.9)

## 2017-03-31 LAB — TROPONIN I: Troponin I: <0.03 ng/mL (ref <0.03)

## 2017-03-31 MED ORDER — ACETAMINOPHEN 500 MG PO TABS
ORAL_TABLET | ORAL | Status: AC
  Administered 2017-03-31: 1000 mg
  Filled 2017-03-31: qty 2

## 2017-03-31 MED ORDER — OSELTAMIVIR PHOSPHATE 30 MG PO CAPS: 30.0000 mg | ORAL_CAPSULE | Freq: Two times a day (BID) | ORAL | 0 refills | Status: DC

## 2017-03-31 MED ORDER — ONDANSETRON 4 MG PO TBDP
4.0000 mg | ORAL_TABLET | Freq: Once | ORAL | Status: AC
  Administered 2017-03-31: 4 mg via ORAL
  Filled 2017-03-31: qty 1

## 2017-03-31 MED ORDER — ONDANSETRON 4 MG PO TBDP: 4.0000 mg | ORAL_TABLET | Freq: Three times a day (TID) | ORAL | 0 refills | Status: DC | PRN

## 2017-03-31 MED ORDER — OSELTAMIVIR PHOSPHATE 30 MG PO CAPS
30.0000 mg | ORAL_CAPSULE | Freq: Once | ORAL | Status: AC
  Administered 2017-03-31: 30 mg via ORAL
  Filled 2017-03-31: qty 1

## 2017-03-31 NOTE — ED Notes (Signed)
Flu swab performed and sent to lab.

## 2017-03-31 NOTE — ED Notes (Signed)
Patient tolerated PO medication well 

## 2017-03-31 NOTE — ED Notes (Signed)
Patient returned from xray. Mask in place. Wife at bedside.

## 2017-03-31 NOTE — ED Triage Notes (Signed)
Of note, patient also states everytime he coughs, he "can't hold his urine."

## 2017-03-31 NOTE — ED Provider Notes (Signed)
[facility] Emergency Department Provider Note  Time seen: 7:28 PM  I have reviewed the triage vital signs and the nursing notes.   HISTORY  Chief Complaint Fever; Dizziness; and Weakness    HPI Allen Chang is a 84 y.o. male a past medical history of gastric reflux, hypertension, hypothyroidism, CLL, thrombus cytopenia, presents to the emergency department for cough congestion weakness and fever.  According to the patient for the past 2-3 days he has had a cough, weakness, subjective fever at home.  Patient also states over the past several days he has been coughing and having episodes of urinary incontinence especially while coughing and feels like he cannot completely empty his bladder.  Patient's fevers 100.9 upon arrival, his main complaint is cough with white sputum production and generalized fatigue/weakness.   Past Medical History:  Diagnosis Date  . Cancer [facility])    prostate   . Complication of anesthesia    bradycardia, in ICU for 24 hour after galbladder surgery.   . Diverticulosis 2012  . Dysrhythmia    Heart skips a beat  . Elevated lipids   . GERD (gastroesophageal reflux disease)   . History of hiatal hernia   . Hypertension   . Hypothyroidism   . Skin cancer    face, scalp, behind ear,back and hand  . Tremors of nervous system     Patient Active Problem List   Diagnosis Date Noted  . Thrombocytopenia ([place]) 10/27/2016  . Iron deficiency 10/27/2016  . CLL (chronic lymphocytic leukemia) ([place]) 10/27/2016  . Macrocytic anemia 10/27/2016  . Status post left partial knee replacement 12/12/2014  . Bradycardia 10/10/2014  . Severe sinus bradycardia 10/10/2014    Past Surgical History:  Procedure Laterality Date  . BLADDER TUMOR EXCISION    . CARDIAC CATHETERIZATION    . CHOLECYSTECTOMY N/A 10/10/2014   Procedure: LAPAROSCOPIC CHOLECYSTECTOMY WITH INTRAOPERATIVE CHOLANGIOGRAM;  Surgeon: Leonie Green, MD;  Location: [facility] ORS;   Service: General;  Laterality: N/A;  . COLONOSCOPY WITH PROPOFOL N/A 02/16/2017   Procedure: COLONOSCOPY WITH PROPOFOL;  Surgeon: Manya Silvas, MD;  Location: [facility] ENDOSCOPY;  Service: Endoscopy;  Laterality: N/A;  . ESOPHAGOGASTRODUODENOSCOPY (EGD) WITH PROPOFOL N/A 02/16/2017   Procedure: ESOPHAGOGASTRODUODENOSCOPY (EGD) WITH PROPOFOL;  Surgeon: Manya Silvas, MD;  Location: [facility] ENDOSCOPY;  Service: Endoscopy;  Laterality: N/A;  . HEMORRHOID SURGERY    . HERNIA REPAIR Left    x2  . JOINT REPLACEMENT Left    Partial Knee Replacement, Dr. Roland Rack  . PARTIAL KNEE ARTHROPLASTY Left 12/12/2014   Procedure: UNICOMPARTMENTAL KNEE;  Surgeon: Corky Mull, MD;  Location: [facility] ORS;  Service: Orthopedics;  Laterality: Left;  . prostate seeding    . SHOULDER ARTHROSCOPY Right   . SHOULDER ARTHROSCOPY WITH OPEN ROTATOR CUFF REPAIR Left 02/07/2016   Procedure: SHOULDER ARTHROSCOPY WITH OPEN ROTATOR CUFF REPAIR;  Surgeon: Corky Mull, MD;  Location: [facility] ORS;  Service: Orthopedics;  Laterality: Left;    Prior to Admission medications   Medication Sig Start Date End Date Taking? Authorizing Provider  aspirin 81 MG tablet Take 81 mg by mouth daily. In am    [provider]  docusate sodium (COLACE) 100 MG capsule Take 200 mg by mouth daily.     [provider]  ferrous sulfate 325 (65 FE) MG EC tablet Take 1 tablet (325 mg total) 3 (three) times daily with meals by mouth. Patient not taking: Reported on 02/25/2017 12/29/16   [name], MD  Flaxseed, Linseed, (FLAX SEED OIL) 1000 MG CAPS Take 1 capsule by mouth daily.    [provider]  Garlic 1025 MG CAPS Take 1 capsule by mouth every morning.    [provider]  latanoprost (XALATAN) 0.005 % ophthalmic solution Place 1 drop into both eyes at bedtime.    [provider]  levothyroxine (SYNTHROID, LEVOTHROID) 25 MCG tablet Take 25 mcg by mouth daily at 12 noon. 04/02/16 04/02/17  [provider]   lisinopril (PRINIVIL,ZESTRIL) 20 MG tablet Take 10 mg by mouth daily. In am    [provider]  loratadine (CLARITIN REDITABS) 10 MG dissolvable tablet Take 10 mg by mouth daily. In am    [provider]  Misc Natural Products (BLACK CHERRY CONCENTRATE) LIQD Take 15 mLs by mouth daily.     [provider]  Multiple Vitamins-Minerals (MULTIVITAMIN WITH MINERALS) tablet Take 1 tablet by mouth daily. In am    [provider]  Omega-3 Fatty Acids (FISH OIL) 1000 MG CAPS Take 1 capsule by mouth daily.    [provider]  omeprazole (PRILOSEC) 20 MG capsule Take 20 mg by mouth daily.     [provider]  polyethylene glycol (MIRALAX / GLYCOLAX) packet Take 17 g by mouth daily.    [provider]  primidone (MYSOLINE) 50 MG tablet Take 50 mg by mouth 2 (two) times daily.    [provider]    Allergies  Allergen Reactions  . Percocet [Oxycodone-Acetaminophen] Other (See Comments)    Hallucination   . Voltaren [Diclofenac Sodium] Palpitations    Family History  Problem Relation Age of Onset  . Bone cancer Father   . Hypertension Mother   . Osteoporosis Mother   . Breast cancer Sister   . Cancer Brother   . Cancer Brother   . Lung cancer Brother   . Bladder Cancer Brother   . Melanoma Brother     Social History Social History   Tobacco Use  . Smoking status: Never Smoker  . Smokeless tobacco: Never Used  Substance Use Topics  . Alcohol use: No  . Drug use: No    Review of Systems Constitutional: Fever over the past 24-48 hours. Eyes: Negative for visual complaints ENT: Positive for congestion Cardiovascular: Negative for chest pain. Respiratory: Negative for shortness of breath.  Positive for cough with white sputum production. Gastrointestinal: Negative for abdominal pain, vomiting and diarrhea. Genitourinary: States having difficulty completely emptying his bladder and occasional incontinence with  coughing episodes. Musculoskeletal: Negative for leg pain or swelling Skin: Negative for skin complaints  Neurological: Negative for headache All other ROS negative  ____________________________________________   PHYSICAL EXAM:  VITAL SIGNS: ED Triage Vitals  Enc Vitals Group     BP 03/31/17 1919 133/64     Pulse Rate 03/31/17 1919 85     Resp --      Temp 03/31/17 1919 (!) 100.9 F (38.3 C)     Temp Source 03/31/17 1919 Oral     SpO2 03/31/17 1918 93 %     Weight 03/31/17 1921 172 lb (78 kg)     Height 03/31/17 1921 5\' 9"  (1.753 m)     Head Circumference --      Peak Flow --      Pain Score 03/31/17 1920 0     Pain Loc --      Pain Edu? --      Excl. in [place]? --    Constitutional: Alert and  oriented. Well appearing and in no distress. Eyes: Normal exam ENT   Head: Normocephalic and atraumatic.   Nose: Mild congestion   Mouth/Throat: Mucous membranes are moist. Cardiovascular: Normal rate, regular rhythm. No murmur Respiratory: Normal respiratory effort without tachypnea nor retractions. Breath sounds are clear.  Occasional cough during exam. Gastrointestinal: Soft and nontender. No distention.   Musculoskeletal: Nontender with normal range of motion in all extremities. No lower extremity tenderness Neurologic:  Normal speech and language. No gross focal neurologic deficits Skin:  Skin is warm, dry and intact.  Psychiatric: Mood and affect are normal.  ____________________________________________    EKG  EKG reviewed and interpreted by myself shows normal sinus rhythm at 86 bpm with a narrow QRS, normal axis, normal intervals, no concerning ST changes.  ____________________________________________    RADIOLOGY  Chest x-ray negative for acute abnormality  ____________________________________________   INITIAL IMPRESSION / ASSESSMENT AND PLAN / ED COURSE  Pertinent labs & imaging results that were available during my care of the patient were  reviewed by me and considered in my medical decision making (see chart for details).  Patient presents to the emergency department for cough, congestion, low-grade fever.  We will check labs, chest x-ray, cultures and lactic acid.  Differential at this time would include upper respiratory infection, influenza, pneumonia, urinary tract infection.  Patient's labs are largely within normal limits besides a mild leukocytosis of 12,000.  Urinalysis is normal.  Patient's influenza PCR is positive for influenza A.  Troponin negative.  Chest x-ray normal.  Overall the patient appears well, reassuring vitals.  States he feels much better.  Discussed with the patient to discontinue his antibiotic use instead we will prescribe Tamiflu as he is presenting within 48 hours of symptom onset.  I also prescribe a nausea medication for the patient.  He will follow-up with his doctor.  I discussed strict return precautions for any difficulty breathing or chest pain.  Patient agreeable to plan.  Otherwise discussed supportive care at home including plenty of rest fluids Tylenol or ibuprofen.  ____________________________________________   FINAL CLINICAL IMPRESSION(S) / ED DIAGNOSES  Influenza   Harvest Dark, MD 03/31/17 2323

## 2017-03-31 NOTE — ED Notes (Signed)
Patient to ED for dizziness, fever and weakness today. Stated that when he tried to get up he would get dizzy and have to sit down so he didn't fall. Patient did not know he had a fever but felt like he was hot then cold.

## 2017-03-31 NOTE — ED Notes (Signed)
Urine specimen sent to lab. Patient has been sweating. Temperature rechecked. See VS

## 2017-03-31 NOTE — Discharge Instructions (Signed)
Please take your Tamiflu as prescribed.  Please use your prescribed Zofran as needed for nausea.  Please use Tylenol or ibuprofen at home as written on the box for fever or discomfort.  Drink plenty of fluids, obtain plenty of rest.  Return to the emergency department for any difficulty breathing, chest pain, or any other symptom personally concerning to yourself.

## 2017-03-31 NOTE — ED Notes (Signed)
MD in to discuss treatment plan with patient.

## 2017-03-31 NOTE — ED Triage Notes (Signed)
Patient to ED via EMS for dizziness, fever and cough. Seen at [facility] yesterday and prescribed Doxycycline and Benzonatate. Wife reports no chest xray was done but they said "infection in his chest." Patient is alert and oriented, able to answer questions appropriately. Wife at bedside.

## 2017-04-05 LAB — CULTURE, BLOOD (ROUTINE X 2)
CULTURE: NO GROWTH
CULTURE: NO GROWTH
Special Requests: ADEQUATE
Special Requests: ADEQUATE

## 2017-05-27 ENCOUNTER — Encounter: Payer: Self-pay | Admitting: Oncology

## 2017-05-27 ENCOUNTER — Inpatient Hospital Stay (HOSPITAL_BASED_OUTPATIENT_CLINIC_OR_DEPARTMENT_OTHER): Payer: Medicare Other | Admitting: Oncology

## 2017-05-27 ENCOUNTER — Inpatient Hospital Stay: Payer: Medicare Other | Attending: Oncology

## 2017-05-27 VITALS — BP 120/68 | HR 61 | Temp 97.5°F | Resp 18 | Wt 175.6 lb

## 2017-05-27 DIAGNOSIS — Z8546 Personal history of malignant neoplasm of prostate: Secondary | ICD-10-CM | POA: Diagnosis not present

## 2017-05-27 DIAGNOSIS — C9110 Chronic lymphocytic leukemia of B-cell type not having achieved remission: Secondary | ICD-10-CM | POA: Diagnosis not present

## 2017-05-27 DIAGNOSIS — Z79899 Other long term (current) drug therapy: Secondary | ICD-10-CM | POA: Diagnosis not present

## 2017-05-27 DIAGNOSIS — K219 Gastro-esophageal reflux disease without esophagitis: Secondary | ICD-10-CM | POA: Insufficient documentation

## 2017-05-27 DIAGNOSIS — Z85828 Personal history of other malignant neoplasm of skin: Secondary | ICD-10-CM | POA: Diagnosis not present

## 2017-05-27 DIAGNOSIS — D696 Thrombocytopenia, unspecified: Secondary | ICD-10-CM | POA: Diagnosis not present

## 2017-05-27 DIAGNOSIS — E039 Hypothyroidism, unspecified: Secondary | ICD-10-CM | POA: Diagnosis not present

## 2017-05-27 DIAGNOSIS — D539 Nutritional anemia, unspecified: Secondary | ICD-10-CM | POA: Insufficient documentation

## 2017-05-27 DIAGNOSIS — D509 Iron deficiency anemia, unspecified: Secondary | ICD-10-CM | POA: Insufficient documentation

## 2017-05-27 DIAGNOSIS — I10 Essential (primary) hypertension: Secondary | ICD-10-CM | POA: Diagnosis not present

## 2017-05-27 DIAGNOSIS — K449 Diaphragmatic hernia without obstruction or gangrene: Secondary | ICD-10-CM | POA: Insufficient documentation

## 2017-05-27 DIAGNOSIS — E611 Iron deficiency: Secondary | ICD-10-CM

## 2017-05-27 DIAGNOSIS — Z7982 Long term (current) use of aspirin: Secondary | ICD-10-CM | POA: Diagnosis not present

## 2017-05-27 LAB — CBC WITH DIFFERENTIAL/PLATELET
Basophils Absolute: 0.1 10*3/uL (ref 0–0.1)
Basophils Relative: 0 %
EOS ABS: 0.1 10*3/uL (ref 0–0.7)
Eosinophils Relative: 1 %
HCT: 37.2 % — ABNORMAL LOW (ref 40.0–52.0)
HEMOGLOBIN: 12.3 g/dL — AB (ref 13.0–18.0)
LYMPHS ABS: 22.4 10*3/uL — AB (ref 1.0–3.6)
Lymphocytes Relative: 83 %
MCH: 34.9 pg — AB (ref 26.0–34.0)
MCHC: 33.2 g/dL (ref 32.0–36.0)
MCV: 105 fL — ABNORMAL HIGH (ref 80.0–100.0)
Monocytes Absolute: 0.5 10*3/uL (ref 0.2–1.0)
Monocytes Relative: 2 %
NEUTROS ABS: 3.7 10*3/uL (ref 1.4–6.5)
NEUTROS PCT: 14 %
Platelets: 112 10*3/uL — ABNORMAL LOW (ref 150–440)
RBC: 3.54 MIL/uL — AB (ref 4.40–5.90)
RDW: 14 % (ref 11.5–14.5)
WBC: 26.7 10*3/uL — AB (ref 3.8–10.6)

## 2017-05-27 LAB — IRON AND TIBC
Iron: 88 ug/dL (ref 45–182)
SATURATION RATIOS: 32 % (ref 17.9–39.5)
TIBC: 279 ug/dL (ref 250–450)
UIBC: 191 ug/dL

## 2017-05-27 LAB — FERRITIN: Ferritin: 47 ng/mL (ref 24–336)

## 2017-05-27 NOTE — Progress Notes (Signed)
Hematology/Oncology Follow Up Note [facility] [phone] [phone]  CONSULT NOTE Patient Care Team: Tracie Harrier, MD as PCP - General (Internal Medicine)  REASON FOR VISIT Follow up for treatment of CLL and iron deficiency anemia   HISTORY OF PRESENTING ILLNESS:  Allen Chang 84 y.o.  male with PMH listed as below who was referred by primary care provider to me for evaluation of leukocytosis. He has a history of prostate cancer which was diagnosed 10 years ago. He underwent brachytherapy. He is not any castration treatment. Per patient, he follows up with Dr.Wolf and most recent PSA is normal.   Patient also reports feeling fatigue lately. Recent labs show leukocytosis, mild anemia, macrocytosis, mild thrombocytopenia, low ferritin. Denies blood in the stool. He was started on over the counter iron supplement but wife who manages patient's medication decides to only let patient to take iron medication every other day.   # received IV iron Venofer x 4. Colonoscopy was done on 02/16/2017 which showed polyps, internal hemorrhoids, negative for malignancy.   INTERVAL HISTORY Allen Chang is a 84 y.o. male who has above history reviewed by me today presents for follow up visit for management of CLL and iron deficiency anemia. He had an ER visit in February and was diagnosed with Influenza.  Today he feels at baseline, take oral iron supplements as instructed. 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    ROS:  Review of Systems  Constitutional: Negative for chills and fatigue.  HENT:   Negative for hearing loss and nosebleeds.   Eyes: Negative for eye problems.  Respiratory: Negative for chest tightness and cough.   Cardiovascular: Negative for chest pain and leg swelling.  Gastrointestinal: Negative for abdominal distention, blood in stool and constipation.  Endocrine: Negative for hot flashes.  Genitourinary: Negative for difficulty  urinating and dysuria.   Musculoskeletal: Negative for arthralgias, back pain and myalgias.  Skin: Negative for itching.       Skin cancer, recently got topical chemotherapy.  Neurological: Negative for dizziness and headaches.  Psychiatric/Behavioral: Negative for decreased concentration. The patient is not nervous/anxious.     MEDICAL HISTORY:  Past Medical History:  Diagnosis Date  . Cancer [facility])    prostate   . Complication of anesthesia    bradycardia, in ICU for 24 hour after galbladder surgery.   . Diverticulosis 2012  . Dysrhythmia    Heart skips a beat  . Elevated lipids   . GERD (gastroesophageal reflux disease)   . History of hiatal hernia   . Hypertension   . Hypothyroidism   . Skin cancer    face, scalp, behind ear,back and hand  . Tremors of nervous system     SURGICAL HISTORY: Past Surgical History:  Procedure Laterality Date  . BLADDER TUMOR EXCISION    . CARDIAC CATHETERIZATION    . CHOLECYSTECTOMY N/A 10/10/2014   Procedure: LAPAROSCOPIC CHOLECYSTECTOMY WITH INTRAOPERATIVE CHOLANGIOGRAM;  Surgeon: Leonie Green, MD;  Location: [facility] ORS;  Service: General;  Laterality: N/A;  . COLONOSCOPY WITH PROPOFOL N/A 02/16/2017   Procedure: COLONOSCOPY WITH PROPOFOL;  Surgeon: Manya Silvas, MD;  Location: [facility] ENDOSCOPY;  Service: Endoscopy;  Laterality: N/A;  . ESOPHAGOGASTRODUODENOSCOPY (EGD) WITH PROPOFOL N/A 02/16/2017   Procedure: ESOPHAGOGASTRODUODENOSCOPY (EGD) WITH PROPOFOL;  Surgeon: Manya Silvas, MD;  Location: [facility] ENDOSCOPY;  Service: Endoscopy;  Laterality: N/A;  . HEMORRHOID SURGERY    . HERNIA REPAIR Left    x2  .  JOINT REPLACEMENT Left    Partial Knee Replacement, Dr. Roland Rack  . PARTIAL KNEE ARTHROPLASTY Left 12/12/2014   Procedure: UNICOMPARTMENTAL KNEE;  Surgeon: Corky Mull, MD;  Location: [facility] ORS;  Service: Orthopedics;  Laterality: Left;  . prostate seeding    . SHOULDER ARTHROSCOPY Right   . SHOULDER ARTHROSCOPY WITH OPEN  ROTATOR CUFF REPAIR Left 02/07/2016   Procedure: SHOULDER ARTHROSCOPY WITH OPEN ROTATOR CUFF REPAIR;  Surgeon: Corky Mull, MD;  Location: [facility] ORS;  Service: Orthopedics;  Laterality: Left;    SOCIAL HISTORY: Social History   Socioeconomic History  . Marital status: Married    Spouse name: Not on file  . Number of children: Not on file  . Years of education: Not on file  . Highest education level: Not on file  Occupational History  . Not on file  Social Needs  . Financial resource strain: Not on file  . Food insecurity:    Worry: Not on file    Inability: Not on file  . Transportation needs:    Medical: Not on file    Non-medical: Not on file  Tobacco Use  . Smoking status: Never Smoker  . Smokeless tobacco: Never Used  Substance and Sexual Activity  . Alcohol use: No  . Drug use: No  . Sexual activity: Yes  Lifestyle  . Physical activity:    Days per week: Not on file    Minutes per session: Not on file  . Stress: Not on file  Relationships  . Social connections:    Talks on phone: Not on file    Gets together: Not on file    Attends religious service: Not on file    Active member of club or organization: Not on file    Attends meetings of clubs or organizations: Not on file    Relationship status: Not on file  . Intimate partner violence:    Fear of current or ex partner: Not on file    Emotionally abused: Not on file    Physically abused: Not on file    Forced sexual activity: Not on file  Other Topics Concern  . Not on file  Social History Narrative  . Not on file    FAMILY HISTORY: Family History  Problem Relation Age of Onset  . Bone cancer Father   . Hypertension Mother   . Osteoporosis Mother   . Breast cancer Sister   . Cancer Brother   . Cancer Brother   . Lung cancer Brother   . Bladder Cancer Brother   . Melanoma Brother     ALLERGIES:  is allergic to percocet [oxycodone-acetaminophen] and voltaren [diclofenac sodium].  MEDICATIONS:    Current Outpatient Medications  Medication Sig Dispense Refill  . aspirin 81 MG tablet Take 81 mg by mouth daily. In am    . docusate sodium (COLACE) 100 MG capsule Take 200 mg by mouth daily.     . ferrous sulfate 325 (65 FE) MG EC tablet Take 1 tablet (325 mg total) 3 (three) times daily with meals by mouth. (Patient not taking: Reported on 02/25/2017) 90 tablet 0  . Flaxseed, Linseed, (FLAX SEED OIL) 1000 MG CAPS Take 1 capsule by mouth daily.    . Garlic 9798 MG CAPS Take 1 capsule by mouth every morning.    . latanoprost (XALATAN) 0.005 % ophthalmic solution Place 1 drop into both eyes at bedtime.    Marland Kitchen levothyroxine (SYNTHROID, LEVOTHROID) 25 MCG tablet Take 25 mcg by mouth  daily at 12 noon.    Marland Kitchen lisinopril (PRINIVIL,ZESTRIL) 20 MG tablet Take 10 mg by mouth daily. In am    . loratadine (CLARITIN REDITABS) 10 MG dissolvable tablet Take 10 mg by mouth daily. In am    . Misc Natural Products (BLACK CHERRY CONCENTRATE) LIQD Take 15 mLs by mouth daily.     . Multiple Vitamins-Minerals (MULTIVITAMIN WITH MINERALS) tablet Take 1 tablet by mouth daily. In am    . Omega-3 Fatty Acids (FISH OIL) 1000 MG CAPS Take 1 capsule by mouth daily.    Marland Kitchen omeprazole (PRILOSEC) 20 MG capsule Take 20 mg by mouth daily.     . ondansetron (ZOFRAN ODT) 4 MG disintegrating tablet Take 1 tablet (4 mg total) by mouth every 8 (eight) hours as needed for nausea or vomiting. 20 tablet 0  . oseltamivir (TAMIFLU) 30 MG capsule Take 1 capsule (30 mg total) by mouth 2 (two) times daily. 10 capsule 0  . polyethylene glycol (MIRALAX / GLYCOLAX) packet Take 17 g by mouth daily.    . primidone (MYSOLINE) 50 MG tablet Take 50 mg by mouth 2 (two) times daily.     No current facility-administered medications for this visit.       Marland Kitchen  PHYSICAL EXAMINATION: ECOG PERFORMANCE STATUS: 0 - Asymptomatic Vitals:   05/27/17 1355  BP: 120/68  Pulse: 61  Resp: 18  Temp: (!) 97.5 F (36.4 C)   Filed Weights   05/27/17 1355   Weight: 175 lb 9 oz (79.6 kg)   Physical Exam  Constitutional: He is oriented to person, place, and time and well-developed, well-nourished, and in no distress. No distress.  HENT:  Head: Normocephalic and atraumatic.  Mouth/Throat: No oropharyngeal exudate.  Eyes: Pupils are equal, round, and reactive to light. EOM are normal. No scleral icterus.  Neck: Normal range of motion. Neck supple.  Cardiovascular: Normal rate and regular rhythm.  No murmur heard. Pulmonary/Chest: Effort normal and breath sounds normal. No respiratory distress. He has no wheezes.  Abdominal: Soft. Bowel sounds are normal. There is no rebound and no guarding.  Musculoskeletal: Normal range of motion. He exhibits no edema.  Lymphadenopathy:    He has no cervical adenopathy.  Neurological: He is alert and oriented to person, place, and time.  Skin: Skin is warm and dry. No erythema.  Psychiatric: Affect normal.  .    LABORATORY DATA:  I have reviewed the data as listed Lab Results  Component Value Date   WBC 26.7 (H) 05/27/2017   HGB 12.3 (L) 05/27/2017   HCT 37.2 (L) 05/27/2017   MCV 105.0 (H) 05/27/2017   PLT 112 (L) 05/27/2017   Recent Labs    10/17/16 1510 12/29/16 1355 03/31/17 1920  NA 137 137 136  K 4.9 5.1 3.8  CL 103 102 104  CO2 _0 GLUCOSE 85 109* 124*  BUN 27* 30* 18  CREATININE 1.64* 1.31* 1.30*  CALCIUM 9.2 9.4 8.4*  GFRNONAA 37* 49* 49*  GFRAA 43* 57* 57*  PROT 7.1 7.2 6.6  ALBUMIN 4.4 4.3 3.7  AST 27 23 35  ALT _1 ALKPHOS 61 67 72  BILITOT 0.4 0.7 0.9    RADIOGRAPHIC STUDIES: I have personally reviewed the radiological images as listed and agreed with the findings in the report. no recent images. 11/04/2017  US abdomen Stable right renal cyst. Status post cholecystectomy.Previously seen lesion within the liver is not well appreciated on this exam. No Splenomegaly.  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   Cytogenetics revealed Trisomy 12   CLL RELATED CLONE DETECTED   The CLL interphase fluorescence in situ hybridization (FISH) panel analysis was positive for three chromosome 12  centromere signals consistent with trisomy 12. Results for CCND1/IGH, ATM, 13q and TP53 were normal.    CLL patients with trisomy 12 as the sole anomaly have an intermediate prognosis. However, recent studies have  shown that trisomy 12 in conjunction with a NOTCH1 mutation, present in about 20% of patients, represents a distinct  subgroup with poor overall survival (see references below). NOTCH1 mutation analysis is not presently available at  [place]:  1. CLL (chronic lymphocytic leukemia) ([place])   2. Iron deficiency   3. Macrocytic anemia   4. Thrombocytopenia ([place])    # CLL, Clinically Rai Stage 0. Prognostic panel showed Trisomy 12, intermediate risk.  US abdomen showed no organomegaly. Thrombocytopenia, >100,000, not associated with clinic risk.  negative hepatitis and HIV. His WBC counts are stable at baseline. Will continue watchful waiting, discussed with patient.   # Iron deficiency anemia,Ferritin improved after IV iron and gradually decreased again. Advise patient to continue take Ferrous sulfate 2-3 time a day.  # Macrocytic anemia:  Normal folate and B12. ?MDS or CLL involvement. CLL appears to be stable, we'll hold bone marrow biopsy at this time..   All questions were answered. The patient knows to call the clinic with any problems questions or concerns.  Return of visit: 3 months. With repeat CBC, iron and ferritin.  [name], MD, PhD Hematology Oncology [facility] Pager- [phone] 05/27/2017

## 2017-05-27 NOTE — Progress Notes (Signed)
Pt in for follow up states doing well.  Verbalizes no concerns or difficulties.

## 2017-06-06 ENCOUNTER — Other Ambulatory Visit: Payer: Self-pay

## 2017-06-06 DIAGNOSIS — Z7982 Long term (current) use of aspirin: Secondary | ICD-10-CM | POA: Insufficient documentation

## 2017-06-06 DIAGNOSIS — Z8546 Personal history of malignant neoplasm of prostate: Secondary | ICD-10-CM | POA: Insufficient documentation

## 2017-06-06 DIAGNOSIS — I10 Essential (primary) hypertension: Secondary | ICD-10-CM | POA: Diagnosis not present

## 2017-06-06 DIAGNOSIS — Z79899 Other long term (current) drug therapy: Secondary | ICD-10-CM | POA: Diagnosis not present

## 2017-06-06 DIAGNOSIS — L509 Urticaria, unspecified: Secondary | ICD-10-CM | POA: Diagnosis not present

## 2017-06-06 DIAGNOSIS — E039 Hypothyroidism, unspecified: Secondary | ICD-10-CM | POA: Diagnosis not present

## 2017-06-06 DIAGNOSIS — K5732 Diverticulitis of large intestine without perforation or abscess without bleeding: Secondary | ICD-10-CM | POA: Diagnosis not present

## 2017-06-06 NOTE — ED Triage Notes (Signed)
Patient ambulatory to stat desk. Patient reports that he started breaking out in "whelps" all over about an hour ago. Patient took 4 benadryl at home prior to arrival. Patient speaking in complete sentences with no respiratory distress at this time.

## 2017-06-06 NOTE — ED Triage Notes (Signed)
Patient reports noticed hives since approximately 8:30 pm.  Patient reports family gave him benadryl 100 mg at approximately 10 pm.

## 2017-06-07 ENCOUNTER — Emergency Department: Payer: Medicare Other

## 2017-06-07 ENCOUNTER — Emergency Department
Admission: EM | Admit: 2017-06-07 | Discharge: 2017-06-07 | Disposition: A | Discharge status: Home / Self Care | Payer: Medicare Other | Attending: Emergency Medicine | Admitting: Emergency Medicine

## 2017-06-07 DIAGNOSIS — L509 Urticaria, unspecified: Secondary | ICD-10-CM

## 2017-06-07 DIAGNOSIS — K5792 Diverticulitis of intestine, part unspecified, without perforation or abscess without bleeding: Secondary | ICD-10-CM

## 2017-06-07 MED ORDER — PREDNISONE 20 MG PO TABS
60.0000 mg | ORAL_TABLET | Freq: Once | ORAL | Status: AC
  Administered 2017-06-07: 60 mg via ORAL
  Filled 2017-06-07: qty 3

## 2017-06-07 MED ORDER — PREDNISONE 50 MG PO TABS: 50.0000 mg | ORAL_TABLET | Freq: Every day | ORAL | 0 refills | Status: AC

## 2017-06-07 MED ORDER — EPINEPHRINE 0.3 MG/0.3ML IJ SOAJ: 0.3000 mg | Freq: Once | INTRAMUSCULAR | 2 refills | Status: AC

## 2017-06-07 MED ORDER — AMOXICILLIN-POT CLAVULANATE 875-125 MG PO TABS
1.0000 | ORAL_TABLET | Freq: Once | ORAL | Status: AC
  Administered 2017-06-07: 1 via ORAL
  Filled 2017-06-07: qty 1

## 2017-06-07 MED ORDER — AMOXICILLIN-POT CLAVULANATE 875-125 MG PO TABS: 1.0000 | ORAL_TABLET | Freq: Two times a day (BID) | ORAL | 0 refills | Status: AC

## 2017-06-07 NOTE — ED Notes (Signed)
ED Provider at bedside. 

## 2017-06-07 NOTE — Discharge Instructions (Signed)
Please take all of your antibiotics as prescribed and follow-up with your primary care physician in 3 days for recheck.  Return to the emergency department sooner for any concerns whatsoever.  It was a pleasure to take care of you today, and thank you for coming to our emergency department.  If you have any questions or concerns before leaving please ask the nurse to grab me and I'm more than happy to go through your aftercare instructions again.  If you were prescribed any opioid pain medication today such as Norco, Vicodin, Percocet, morphine, hydrocodone, or oxycodone please make sure you do not drive when you are taking this medication as it can alter your ability to drive safely.  If you have any concerns once you are home that you are not improving or are in fact getting worse before you can make it to your follow-up appointment, please do not hesitate to call 911 and come back for further evaluation.  Darel Hong, MD  Results for orders placed or performed in visit on 05/27/17  Iron and TIBC  Result Value Ref Range   Iron 88 45 - 182 ug/dL   TIBC 279 250 - 450 ug/dL   Saturation Ratios 32 17.9 - 39.5 %   UIBC 191 ug/dL  Ferritin  Result Value Ref Range   Ferritin 47 24 - 336 ng/mL  CBC with Differential/Platelet  Result Value Ref Range   WBC 26.7 (H) 3.8 - 10.6 K/uL   RBC 3.54 (L) 4.40 - 5.90 MIL/uL   Hemoglobin 12.3 (L) 13.0 - 18.0 g/dL   HCT 37.2 (L) 40.0 - 52.0 %   MCV 105.0 (H) 80.0 - 100.0 fL   MCH 34.9 (H) 26.0 - 34.0 pg   MCHC 33.2 32.0 - 36.0 g/dL   RDW 14.0 11.5 - 14.5 %   Platelets 112 (L) 150 - 440 K/uL   Neutrophils Relative % 14 %   Neutro Abs 3.7 1.4 - 6.5 K/uL   Lymphocytes Relative 83 %   Lymphs Abs 22.4 (H) 1.0 - 3.6 K/uL   Monocytes Relative 2 %   Monocytes Absolute 0.5 0.2 - 1.0 K/uL   Eosinophils Relative 1 %   Eosinophils Absolute 0.1 0 - 0.7 K/uL   Basophils Relative 0 %   Basophils Absolute 0.1 0 - 0.1 K/uL   Ct Abdomen Pelvis Wo  Contrast  Result Date: 06/07/2017 CLINICAL DATA:  Abdominal pain and swelling. History of prostate cancer. EXAM: CT ABDOMEN AND PELVIS WITHOUT CONTRAST TECHNIQUE: Multidetector CT imaging of the abdomen and pelvis was performed following the standard protocol without IV contrast. COMPARISON:  CT abdomen pelvis 08/09/2015 FINDINGS: LOWER CHEST: No basilar pulmonary nodules or pleural effusion. No apical pericardial effusion. HEPATOBILIARY: Normal hepatic contours and density. No intra- or extrahepatic biliary dilatation. Status post cholecystectomy. PANCREAS: Normal parenchymal contours without ductal dilatation. No peripancreatic fluid collection. SPLEEN: Normal. ADRENALS/URINARY TRACT: --Adrenal glands: Normal. --Right kidney/ureter: No hydronephrosis, nephroureterolithiasis, perinephric stranding or solid renal mass. --Left kidney/ureter: No hydronephrosis, nephroureterolithiasis, perinephric stranding or solid renal mass. --Urinary bladder: Normal for degree of distention STOMACH/BOWEL: --Stomach/Duodenum: No hiatal hernia or other gastric abnormality. Normal duodenal course. --Small bowel: No dilatation or inflammation. --Colon: There is descending and sigmoid colonic diverticulosis with 2 areas of mild inflammation, 1 at the mid descending colon and 1 at the proximal sigmoid colon. No free intraperitoneal air. No fluid collection. --Appendix: Not visualized. No right lower quadrant inflammation or free fluid. VASCULAR/LYMPHATIC: Atherosclerotic calcification is present within the non-aneurysmal abdominal  aorta, without hemodynamically significant stenosis. No abdominal or pelvic lymphadenopathy. REPRODUCTIVE: Brachytherapy seeds in the prostate. MUSCULOSKELETAL. Multilevel degenerative disc disease and facet arthrosis. No bony spinal canal stenosis. OTHER: Left inguinal hernia repair. IMPRESSION: 1. Descending and sigmoid colonic diverticulosis with 2 areas of mild inflammation, suggesting early acute  diverticulitis. 2. No free intraperitoneal air or fluid collection. 3.  Aortic Atherosclerosis (ICD10-I70.0). Electronically Signed   By: Ulyses Jarred M.D.   On: 06/07/2017 05:05

## 2017-06-07 NOTE — ED Notes (Signed)
Patient transported to CT 

## 2017-06-07 NOTE — ED Provider Notes (Signed)
[facility] Emergency Department Provider Note  ____________________________________________   First MD Initiated Contact with Patient 06/07/17 0403     (approximate)  I have reviewed the triage vital signs and the nursing notes.   HISTORY  Chief Complaint Urticaria   HPI Allen Chang is a 84 y.o. male who comes to the emergency department with 2 issues.  First he says for the past hour he has developed an itchy rash across his chest and face.  He took 100 mg of oral Benadryl at home with near immediate improvement in his symptoms.  No shortness of breath.  No throat closing.  He also says that he feels like "I might have diverticulitis again".  He has mild lower abdominal discomfort.  Some constipation.  No nausea vomiting.  No diarrhea.  No fevers or chills.  He is able to eat and drink.  Past Medical History:  Diagnosis Date  . Cancer [facility])    prostate   . Complication of anesthesia    bradycardia, in ICU for 24 hour after galbladder surgery.   . Diverticulosis 2012  . Dysrhythmia    Heart skips a beat  . Elevated lipids   . GERD (gastroesophageal reflux disease)   . History of hiatal hernia   . Hypertension   . Hypothyroidism   . Skin cancer    face, scalp, behind ear,back and hand  . Tremors of nervous system     Patient Active Problem List   Diagnosis Date Noted  . Thrombocytopenia ([place]) 10/27/2016  . Iron deficiency 10/27/2016  . CLL (chronic lymphocytic leukemia) ([place]) 10/27/2016  . Macrocytic anemia 10/27/2016  . Status post left partial knee replacement 12/12/2014  . Bradycardia 10/10/2014  . Severe sinus bradycardia 10/10/2014    Past Surgical History:  Procedure Laterality Date  . BLADDER TUMOR EXCISION    . CARDIAC CATHETERIZATION    . CHOLECYSTECTOMY N/A 10/10/2014   Procedure: LAPAROSCOPIC CHOLECYSTECTOMY WITH INTRAOPERATIVE CHOLANGIOGRAM;  Surgeon: Leonie Green, MD;  Location: [facility] ORS;  Service: General;   Laterality: N/A;  . COLONOSCOPY WITH PROPOFOL N/A 02/16/2017   Procedure: COLONOSCOPY WITH PROPOFOL;  Surgeon: Manya Silvas, MD;  Location: [facility] ENDOSCOPY;  Service: Endoscopy;  Laterality: N/A;  . ESOPHAGOGASTRODUODENOSCOPY (EGD) WITH PROPOFOL N/A 02/16/2017   Procedure: ESOPHAGOGASTRODUODENOSCOPY (EGD) WITH PROPOFOL;  Surgeon: Manya Silvas, MD;  Location: [facility] ENDOSCOPY;  Service: Endoscopy;  Laterality: N/A;  . HEMORRHOID SURGERY    . HERNIA REPAIR Left    x2  . JOINT REPLACEMENT Left    Partial Knee Replacement, Dr. Roland Rack  . PARTIAL KNEE ARTHROPLASTY Left 12/12/2014   Procedure: UNICOMPARTMENTAL KNEE;  Surgeon: Corky Mull, MD;  Location: [facility] ORS;  Service: Orthopedics;  Laterality: Left;  . prostate seeding    . SHOULDER ARTHROSCOPY Right   . SHOULDER ARTHROSCOPY WITH OPEN ROTATOR CUFF REPAIR Left 02/07/2016   Procedure: SHOULDER ARTHROSCOPY WITH OPEN ROTATOR CUFF REPAIR;  Surgeon: Corky Mull, MD;  Location: [facility] ORS;  Service: Orthopedics;  Laterality: Left;    Prior to Admission medications   Medication Sig Start Date End Date Taking? Authorizing Provider  amoxicillin-clavulanate (AUGMENTIN) 875-125 MG tablet Take 1 tablet by mouth 2 (two) times daily for 10 days. 06/07/17 06/17/17  Darel Hong, MD  aspirin 81 MG tablet Take 81 mg by mouth daily. In am    [provider]  docusate sodium (COLACE) 100 MG capsule Take 200 mg by mouth daily.     [provider]  ferrous sulfate 325 (65 FE) MG EC tablet Take 1 tablet (325 mg total) 3 (three) times daily with meals by mouth. 12/29/16   [name], MD  Flaxseed, Linseed, (FLAX SEED OIL) 1000 MG CAPS Take 1 capsule by mouth daily.    [provider]  Garlic 2620 MG CAPS Take 1 capsule by mouth every morning.    [provider]  latanoprost (XALATAN) 0.005 % ophthalmic solution Place 1 drop into both eyes at bedtime.    [provider]  levothyroxine (SYNTHROID, LEVOTHROID) 25 MCG  tablet Take 25 mcg by mouth daily at 12 noon. 04/02/16   [provider]  lisinopril (PRINIVIL,ZESTRIL) 20 MG tablet Take 10 mg by mouth daily. In am    [provider]  loratadine (CLARITIN REDITABS) 10 MG dissolvable tablet Take 10 mg by mouth daily. In am    [provider]  Misc Natural Products (BLACK CHERRY CONCENTRATE) LIQD Take 15 mLs by mouth daily.     [provider]  Multiple Vitamins-Minerals (MULTIVITAMIN WITH MINERALS) tablet Take 1 tablet by mouth daily. In am    [provider]  Omega-3 Fatty Acids (FISH OIL) 1000 MG CAPS Take 1 capsule by mouth daily.    [provider]  omeprazole (PRILOSEC) 20 MG capsule Take 20 mg by mouth daily.     [provider]  polyethylene glycol (MIRALAX / GLYCOLAX) packet Take 17 g by mouth daily.    [provider]  predniSONE (DELTASONE) 50 MG tablet Take 1 tablet (50 mg total) by mouth daily for 4 days. 06/07/17 06/11/17  Darel Hong, MD  primidone (MYSOLINE) 50 MG tablet Take 50 mg by mouth 2 (two) times daily.    [provider]    Allergies Percocet [oxycodone-acetaminophen] and Voltaren [diclofenac sodium]  Family History  Problem Relation Age of Onset  . Bone cancer Father   . Hypertension Mother   . Osteoporosis Mother   . Breast cancer Sister   . Cancer Brother   . Cancer Brother   . Lung cancer Brother   . Bladder Cancer Brother   . Melanoma Brother     Social History Social History   Tobacco Use  . Smoking status: Never Smoker  . Smokeless tobacco: Never Used  Substance Use Topics  . Alcohol use: No  . Drug use: No    Review of Systems Constitutional: No fever/chills ENT: No sore throat. Cardiovascular: Denies chest pain. Respiratory: Denies shortness of breath. Gastrointestinal: Positive for abdominal pain.  No nausea, no vomiting.  No diarrhea.  Positive for constipation. Musculoskeletal: Negative for back pain. Neurological:  Negative for headaches   ____________________________________________   PHYSICAL EXAM:  VITAL SIGNS: ED Triage Vitals  Enc Vitals Group     BP 06/06/17 2340 (!) 134/57     Pulse Rate 06/06/17 2340 (!) 58     Resp 06/06/17 2340 16     Temp 06/06/17 2340 98.5 F (36.9 C)     Temp Source 06/06/17 2340 Oral     SpO2 06/06/17 2340 100 %     Weight 06/06/17 2341 170 lb (77.1 kg)     Height 06/06/17 2341 5\' 9"  (1.753 m)     Head Circumference --      Peak Flow --      Pain Score --      Pain Loc --      Pain Edu? --      Excl. in [place]? --  Constitutional: Alert and oriented x4 joking laughing well-appearing nontoxic no diaphoresis speaks full clear sentences Head: Atraumatic. Nose: No congestion/rhinnorhea. Mouth/Throat: No trismus Neck: No stridor.   Cardiovascular: Bradycardic regular rhythm no murmurs Respiratory: Normal respiratory effort.  No retractions. Gastrointestinal: Soft very mild left lower quadrant tenderness no rebound or guarding no peritonitis Neurologic:  Normal speech and language. No gross focal neurologic deficits are appreciated.  Skin: Faint blanching urticaria across chest    ____________________________________________  LABS (all labs ordered are listed, but only abnormal results are displayed)  Labs Reviewed - No data to display   __________________________________________  EKG   ____________________________________________  RADIOLOGY  CT abdomen pelvis noncontrast reviewed by me consistent with simple uncomplicated diverticulitis ____________________________________________   DIFFERENTIAL includes but not limited to  Allergic reaction, anaphylaxis, diverticulitis, abscess   PROCEDURES  Procedure(s) performed: no  Procedures  Critical Care performed: no  Observation: no ____________________________________________   INITIAL IMPRESSION / ASSESSMENT AND PLAN / ED COURSE  Pertinent labs & imaging results that were  available during my care of the patient were reviewed by me and considered in my medical decision making (see chart for details).  Regarding the patient's allergic reaction it appears urticarial with no evidence of anaphylaxis.  I will treat him with steroids as well as Benadryl and Pepcid as an outpatient and give him an epinephrine pen as he is subsequent allergic reaction in the future could very well be anaphylaxis.  Regarding his abdominal pain CT scan does confirm simple uncomplicated diverticulitis.  I will treat him with Augmentin and refer him back to primary care.  Strict return precautions have been given and the patient verbalizes understanding and agreement the plan.      ____________________________________________   FINAL CLINICAL IMPRESSION(S) / ED DIAGNOSES  Final diagnoses:  Diverticulitis  Urticaria      NEW MEDICATIONS STARTED DURING THIS VISIT:  Discharge Medication List as of 06/07/2017  5:10 AM    START taking these medications   Details  amoxicillin-clavulanate (AUGMENTIN) 875-125 MG tablet Take 1 tablet by mouth 2 (two) times daily for 10 days., Starting Sun 06/07/2017, Until Wed 06/17/2017, Print    EPINEPHrine (ADRENACLICK) 0.3 SN/0.5 mL IJ SOAJ injection Inject 0.3 mLs (0.3 mg total) into the muscle once for 1 dose., Starting Sun 06/07/2017, Print    predniSONE (DELTASONE) 50 MG tablet Take 1 tablet (50 mg total) by mouth daily for 4 days., Starting Sun 06/07/2017, Until Thu 06/11/2017, Print         Note:  This document was prepared using Dragon voice recognition software and may include unintentional dictation errors.      Darel Hong, MD 06/11/17 2153

## 2017-06-07 NOTE — ED Notes (Signed)
Reviewed discharge instructions, follow-up care, and prescriptions with patient. Patient verbalized understanding of all information reviewed. Patient stable, with no distress noted at this time.    

## 2017-07-15 ENCOUNTER — Other Ambulatory Visit: Payer: Self-pay | Admitting: Student

## 2017-07-15 DIAGNOSIS — M19012 Primary osteoarthritis, left shoulder: Secondary | ICD-10-CM

## 2017-07-15 DIAGNOSIS — M7582 Other shoulder lesions, left shoulder: Secondary | ICD-10-CM

## 2017-07-29 ENCOUNTER — Ambulatory Visit
Admission: RE | Admit: 2017-07-29 | Discharge: 2017-07-29 | Disposition: A | Discharge status: Home / Self Care | Payer: Medicare Other | Source: Ambulatory Visit | Attending: Student | Admitting: Student

## 2017-07-29 DIAGNOSIS — M75102 Unspecified rotator cuff tear or rupture of left shoulder, not specified as traumatic: Secondary | ICD-10-CM | POA: Insufficient documentation

## 2017-07-29 DIAGNOSIS — M7582 Other shoulder lesions, left shoulder: Secondary | ICD-10-CM | POA: Diagnosis present

## 2017-07-29 DIAGNOSIS — M19012 Primary osteoarthritis, left shoulder: Secondary | ICD-10-CM | POA: Insufficient documentation

## 2017-08-31 ENCOUNTER — Inpatient Hospital Stay: Payer: Medicare Other | Attending: Oncology

## 2017-08-31 DIAGNOSIS — I10 Essential (primary) hypertension: Secondary | ICD-10-CM | POA: Diagnosis not present

## 2017-08-31 DIAGNOSIS — C9110 Chronic lymphocytic leukemia of B-cell type not having achieved remission: Secondary | ICD-10-CM | POA: Insufficient documentation

## 2017-08-31 DIAGNOSIS — Z85828 Personal history of other malignant neoplasm of skin: Secondary | ICD-10-CM | POA: Diagnosis not present

## 2017-08-31 DIAGNOSIS — Z8546 Personal history of malignant neoplasm of prostate: Secondary | ICD-10-CM | POA: Diagnosis not present

## 2017-08-31 DIAGNOSIS — D696 Thrombocytopenia, unspecified: Secondary | ICD-10-CM | POA: Insufficient documentation

## 2017-08-31 DIAGNOSIS — K449 Diaphragmatic hernia without obstruction or gangrene: Secondary | ICD-10-CM | POA: Insufficient documentation

## 2017-08-31 DIAGNOSIS — K219 Gastro-esophageal reflux disease without esophagitis: Secondary | ICD-10-CM | POA: Insufficient documentation

## 2017-08-31 DIAGNOSIS — D509 Iron deficiency anemia, unspecified: Secondary | ICD-10-CM | POA: Diagnosis not present

## 2017-08-31 DIAGNOSIS — Z79899 Other long term (current) drug therapy: Secondary | ICD-10-CM | POA: Diagnosis not present

## 2017-08-31 DIAGNOSIS — E039 Hypothyroidism, unspecified: Secondary | ICD-10-CM | POA: Insufficient documentation

## 2017-08-31 DIAGNOSIS — D539 Nutritional anemia, unspecified: Secondary | ICD-10-CM

## 2017-08-31 DIAGNOSIS — E611 Iron deficiency: Secondary | ICD-10-CM

## 2017-08-31 LAB — CBC WITH DIFFERENTIAL/PLATELET
BASOS ABS: 0.1 10*3/uL (ref 0–0.1)
BASOS PCT: 0 %
EOS ABS: 0.2 10*3/uL (ref 0–0.7)
EOS PCT: 1 %
HCT: 37.6 % — ABNORMAL LOW (ref 40.0–52.0)
Hemoglobin: 12.6 g/dL — ABNORMAL LOW (ref 13.0–18.0)
Lymphocytes Relative: 83 %
Lymphs Abs: 20.8 10*3/uL — ABNORMAL HIGH (ref 1.0–3.6)
MCH: 34.7 pg — ABNORMAL HIGH (ref 26.0–34.0)
MCHC: 33.6 g/dL (ref 32.0–36.0)
MCV: 103.1 fL — ABNORMAL HIGH (ref 80.0–100.0)
Monocytes Absolute: 0.8 10*3/uL (ref 0.2–1.0)
Monocytes Relative: 3 %
Neutro Abs: 3.1 10*3/uL (ref 1.4–6.5)
Neutrophils Relative %: 13 %
PLATELETS: 98 10*3/uL — AB (ref 150–440)
RBC: 3.64 MIL/uL — AB (ref 4.40–5.90)
RDW: 13.4 % (ref 11.5–14.5)
WBC: 24.9 10*3/uL — AB (ref 3.8–10.6)

## 2017-08-31 LAB — IRON AND TIBC
IRON: 108 ug/dL (ref 45–182)
SATURATION RATIOS: 40 % — AB (ref 17.9–39.5)
TIBC: 271 ug/dL (ref 250–450)
UIBC: 163 ug/dL

## 2017-08-31 LAB — FERRITIN: Ferritin: 47 ng/mL (ref 24–336)

## 2017-09-02 ENCOUNTER — Other Ambulatory Visit: Payer: Self-pay

## 2017-09-02 ENCOUNTER — Inpatient Hospital Stay (HOSPITAL_BASED_OUTPATIENT_CLINIC_OR_DEPARTMENT_OTHER): Payer: Medicare Other | Admitting: Oncology

## 2017-09-02 ENCOUNTER — Encounter: Payer: Self-pay | Admitting: Oncology

## 2017-09-02 VITALS — BP 120/68 | HR 54 | Temp 97.9°F | Resp 18 | Wt 171.6 lb

## 2017-09-02 DIAGNOSIS — D509 Iron deficiency anemia, unspecified: Secondary | ICD-10-CM | POA: Diagnosis not present

## 2017-09-02 DIAGNOSIS — C9110 Chronic lymphocytic leukemia of B-cell type not having achieved remission: Secondary | ICD-10-CM

## 2017-09-02 DIAGNOSIS — D696 Thrombocytopenia, unspecified: Secondary | ICD-10-CM

## 2017-09-02 DIAGNOSIS — Z79899 Other long term (current) drug therapy: Secondary | ICD-10-CM | POA: Diagnosis not present

## 2017-09-02 NOTE — Progress Notes (Signed)
Patient here for follow up. No concerns voiced.  °

## 2017-09-02 NOTE — Progress Notes (Signed)
Hematology/Oncology Follow Up Note [facility] [phone] [phone]  CONSULT NOTE Patient Care Team: Tracie Harrier, MD as PCP - General (Internal Medicine)  REASON FOR VISIT Follow up for treatment of CLL and iron deficiency anemia   HISTORY OF PRESENTING ILLNESS:  Allen Chang 84 y.o.  male with PMH listed as below who was referred by primary care provider to me for evaluation of leukocytosis. He has a history of prostate cancer which was diagnosed 10 years ago. He underwent brachytherapy. He is not any castration treatment. Per patient, he follows up with Dr.Wolf and most recent PSA is normal.   Patient also reports feeling fatigue lately. Recent labs show leukocytosis, mild anemia, macrocytosis, mild thrombocytopenia, low ferritin. Denies blood in the stool. He was started on over the counter iron supplement but wife who manages patient's medication decides to only let patient to take iron medication every other day.   # received IV iron Venofer x 4. Colonoscopy was done on 02/16/2017 which showed polyps, internal hemorrhoids, negative for malignancy.   INTERVAL HISTORY Allen Chang is a 84 y.o. male who has above history reviewed by me presents for follow up visit for management of CLL and iron deficiency anemia.   During the interval, patient presented emergency room for evaluation of abdominal pain.  CT abdomen pelvis without contrast was done in the emergency room which showed descending and sigmoid: Diverticulosis with 2 area of mild inflammation.  Suggesting early acute diverticulitis.  #Patient reports feeling well.  Denies any fatigue, weight loss, fever or chills.  He works every day in the form. #He takes oral iron supplements as instructed.    ROS:  Review of Systems  Constitutional: Negative for chills, diaphoresis, fatigue, fever and unexpected weight change.  HENT:   Negative for hearing loss, lump/mass, nosebleeds  and sore throat.   Eyes: Negative for eye problems and icterus.  Respiratory: Negative for chest tightness, cough, hemoptysis, shortness of breath and wheezing.   Cardiovascular: Negative for chest pain and leg swelling.  Gastrointestinal: Negative for abdominal distention, abdominal pain, blood in stool, constipation, diarrhea, nausea and rectal pain.  Endocrine: Negative for hot flashes.  Genitourinary: Negative for bladder incontinence, difficulty urinating, dysuria, frequency, hematuria and nocturia.   Musculoskeletal: Negative for arthralgias, back pain, flank pain, gait problem and myalgias.  Skin: Negative for itching and rash.  Neurological: Negative for dizziness, gait problem, headaches, numbness and seizures.  Hematological: Negative for adenopathy. Does not bruise/bleed easily.  Psychiatric/Behavioral: Negative for confusion and decreased concentration. The patient is not nervous/anxious.     MEDICAL HISTORY:  Past Medical History:  Diagnosis Date  . Cancer [facility])    prostate   . Complication of anesthesia    bradycardia, in ICU for 24 hour after galbladder surgery.   . Diverticulosis 2012  . Dysrhythmia    Heart skips a beat  . Elevated lipids   . GERD (gastroesophageal reflux disease)   . History of hiatal hernia   . Hypertension   . Hypothyroidism   . Skin cancer    face, scalp, behind ear,back and hand  . Tremors of nervous system     SURGICAL HISTORY: Past Surgical History:  Procedure Laterality Date  . BLADDER TUMOR EXCISION    . CARDIAC CATHETERIZATION    . CHOLECYSTECTOMY N/A 10/10/2014   Procedure: LAPAROSCOPIC CHOLECYSTECTOMY WITH INTRAOPERATIVE CHOLANGIOGRAM;  Surgeon: Leonie Green, MD;  Location: [facility] ORS;  Service: General;  Laterality: N/A;  . COLONOSCOPY WITH  PROPOFOL N/A 02/16/2017   Procedure: COLONOSCOPY WITH PROPOFOL;  Surgeon: Manya Silvas, MD;  Location: [facility] ENDOSCOPY;  Service: Endoscopy;  Laterality: N/A;  .  ESOPHAGOGASTRODUODENOSCOPY (EGD) WITH PROPOFOL N/A 02/16/2017   Procedure: ESOPHAGOGASTRODUODENOSCOPY (EGD) WITH PROPOFOL;  Surgeon: Manya Silvas, MD;  Location: [facility] ENDOSCOPY;  Service: Endoscopy;  Laterality: N/A;  . HEMORRHOID SURGERY    . HERNIA REPAIR Left    x2  . JOINT REPLACEMENT Left    Partial Knee Replacement, Dr. Roland Rack  . PARTIAL KNEE ARTHROPLASTY Left 12/12/2014   Procedure: UNICOMPARTMENTAL KNEE;  Surgeon: Corky Mull, MD;  Location: [facility] ORS;  Service: Orthopedics;  Laterality: Left;  . prostate seeding    . SHOULDER ARTHROSCOPY Right   . SHOULDER ARTHROSCOPY WITH OPEN ROTATOR CUFF REPAIR Left 02/07/2016   Procedure: SHOULDER ARTHROSCOPY WITH OPEN ROTATOR CUFF REPAIR;  Surgeon: Corky Mull, MD;  Location: [facility] ORS;  Service: Orthopedics;  Laterality: Left;    SOCIAL HISTORY: Social History   Socioeconomic History  . Marital status: Married    Spouse name: Not on file  . Number of children: Not on file  . Years of education: Not on file  . Highest education level: Not on file  Occupational History  . Not on file  Social Needs  . Financial resource strain: Not on file  . Food insecurity:    Worry: Not on file    Inability: Not on file  . Transportation needs:    Medical: Not on file    Non-medical: Not on file  Tobacco Use  . Smoking status: Never Smoker  . Smokeless tobacco: Never Used  Substance and Sexual Activity  . Alcohol use: No  . Drug use: No  . Sexual activity: Yes  Lifestyle  . Physical activity:    Days per week: Not on file    Minutes per session: Not on file  . Stress: Not on file  Relationships  . Social connections:    Talks on phone: Not on file    Gets together: Not on file    Attends religious service: Not on file    Active member of club or organization: Not on file    Attends meetings of clubs or organizations: Not on file    Relationship status: Not on file  . Intimate partner violence:    Fear of current or ex partner:  Not on file    Emotionally abused: Not on file    Physically abused: Not on file    Forced sexual activity: Not on file  Other Topics Concern  . Not on file  Social History Narrative  . Not on file    FAMILY HISTORY: Family History  Problem Relation Age of Onset  . Bone cancer Father   . Hypertension Mother   . Osteoporosis Mother   . Breast cancer Sister   . Cancer Brother   . Cancer Brother   . Lung cancer Brother   . Bladder Cancer Brother   . Melanoma Brother     ALLERGIES:  is allergic to percocet [oxycodone-acetaminophen] and voltaren [diclofenac sodium].  MEDICATIONS:  Current Outpatient Medications  Medication Sig Dispense Refill  . aspirin 81 MG tablet Take 81 mg by mouth daily. In am    . docusate sodium (COLACE) 100 MG capsule Take 200 mg by mouth daily.     . ferrous sulfate 325 (65 FE) MG EC tablet Take 1 tablet (325 mg total) 3 (three) times daily with meals by mouth. [place]  tablet 0  . Flaxseed, Linseed, (FLAX SEED OIL) 1000 MG CAPS Take 1 capsule by mouth daily.    . Garlic 1027 MG CAPS Take 1 capsule by mouth every morning.    . latanoprost (XALATAN) 0.005 % ophthalmic solution Place 1 drop into both eyes at bedtime.    Marland Kitchen levothyroxine (SYNTHROID, LEVOTHROID) 25 MCG tablet Take 25 mcg by mouth daily at 12 noon.    Marland Kitchen lisinopril (PRINIVIL,ZESTRIL) 20 MG tablet Take 10 mg by mouth daily. In am    . loratadine (CLARITIN REDITABS) 10 MG dissolvable tablet Take 10 mg by mouth daily. In am    . Misc Natural Products (BLACK CHERRY CONCENTRATE) LIQD Take 15 mLs by mouth daily.     . Multiple Vitamins-Minerals (MULTIVITAMIN WITH MINERALS) tablet Take 1 tablet by mouth daily. In am    . Omega-3 Fatty Acids (FISH OIL) 1000 MG CAPS Take 1 capsule by mouth daily.    Marland Kitchen omeprazole (PRILOSEC) 20 MG capsule Take 20 mg by mouth daily.     . polyethylene glycol (MIRALAX / GLYCOLAX) packet Take 17 g by mouth daily.    . primidone (MYSOLINE) 50 MG tablet Take 50 mg by mouth 2 (two)  times daily.     No current facility-administered medications for this visit.       Marland Kitchen  PHYSICAL EXAMINATION: ECOG PERFORMANCE STATUS: 0 - Asymptomatic Vitals:   09/02/17 1324  BP: 120/68  Pulse: (!) 54  Resp: 18  Temp: 97.9 F (36.6 C)  SpO2: 99%   Filed Weights   09/02/17 1324  Weight: 171 lb 9.6 oz (77.8 kg)   Physical Exam  Constitutional: He is oriented to person, place, and time and well-developed, well-nourished, and in no distress. No distress.  HENT:  Head: Normocephalic and atraumatic.  Nose: Nose normal.  Mouth/Throat: Oropharynx is clear and moist. No oropharyngeal exudate.  Eyes: Pupils are equal, round, and reactive to light. EOM are normal. Left eye exhibits no discharge. No scleral icterus.  Neck: Normal range of motion. Neck supple.  Cardiovascular: Normal rate and regular rhythm.  No murmur heard. Pulmonary/Chest: Effort normal and breath sounds normal. No respiratory distress. He has no wheezes. He has no rales. He exhibits no tenderness.  Abdominal: Soft. Bowel sounds are normal. He exhibits no distension and no mass. There is no tenderness. There is no rebound and no guarding.  Musculoskeletal: Normal range of motion. He exhibits no edema.  Lymphadenopathy:    He has no cervical adenopathy.  Neurological: He is alert and oriented to person, place, and time. No cranial nerve deficit. He exhibits normal muscle tone. Coordination normal.  Skin: Skin is warm and dry. He is not diaphoretic. No erythema.  Psychiatric: Affect normal.  .    LABORATORY DATA:  I have reviewed the data as listed Lab Results  Component Value Date   WBC 24.9 (H) 08/31/2017   HGB 12.6 (L) 08/31/2017   HCT 37.6 (L) 08/31/2017   MCV 103.1 (H) 08/31/2017   PLT 98 (L) 08/31/2017   Recent Labs    10/17/16 1510 12/29/16 1355 03/31/17 1920  NA 137 137 136  K 4.9 5.1 3.8  CL 103 102 104  CO2 '25 24 22  ' GLUCOSE 85 109* 124*  BUN 27* 30* 18  CREATININE 1.64* 1.31* 1.30*    CALCIUM 9.2 9.4 8.4*  GFRNONAA 37* 49* 49*  GFRAA 43* 57* 57*  PROT 7.1 7.2 6.6  ALBUMIN 4.4 4.3 3.7  AST 27 23 35  ALT '22 19 27  ' ALKPHOS 61 67 72  BILITOT 0.4 0.7 0.9    RADIOGRAPHIC STUDIES: I have personally reviewed the radiological images as listed and agreed with the findings in the report. no recent images. 11/04/2017  US abdomen Stable right renal cyst. Status post cholecystectomy.Previously seen lesion within the liver is not well appreciated on this exam. No Splenomegaly.   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 Results for CCND1/IGH, ATM, 13q and TP53 were normal.    CLL patients with trisomy 12 as the sole anomaly have an intermediate prognosis. However, recent studies have  shown that trisomy 12 in conjunction with a NOTCH1 mutation, present in about 20% of patients, represents a distinct  subgroup with poor overall survival (see references below). NOTCH1 mutation analysis is not presently available at  [facility]:  1. CLL (chronic lymphocytic leukemia) ([place])   2. 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 No hepato-splenomegaly.  Thrombocytopenia has previously always above 100,000, however today's labs showed decreased platelet counts to 98,000,  discussed with patient that  will will close monitor his platelet counts.  For now he does not have any symptoms, will continue watchful waiting. #Macrocytic anemia with normal folate and B12 level.  Questionable MDS or CLL involvement. If platelet counts continue to decline, plan bone marrow biopsy to clarify.   # Iron deficiency anemia, iron panel reviewed and discussed with patient.  Stable iron  stores.  Continue oral iron supplementation.  All questions were answered. The patient knows to call the clinic with any problems questions or concerns.  Return of visit: 2 months repeat CBC,  [name], MD, PhD Hematology Oncology [facility] Pager- [phone] 09/02/2017

## 2017-11-02 ENCOUNTER — Inpatient Hospital Stay (HOSPITAL_BASED_OUTPATIENT_CLINIC_OR_DEPARTMENT_OTHER): Payer: Medicare Other | Admitting: Oncology

## 2017-11-02 ENCOUNTER — Inpatient Hospital Stay: Payer: Medicare Other | Attending: Oncology

## 2017-11-02 ENCOUNTER — Encounter: Payer: Self-pay | Admitting: Oncology

## 2017-11-02 ENCOUNTER — Other Ambulatory Visit: Payer: Self-pay

## 2017-11-02 VITALS — BP 123/59 | HR 54 | Temp 97.4°F | Resp 18 | Wt 176.8 lb

## 2017-11-02 DIAGNOSIS — Z8546 Personal history of malignant neoplasm of prostate: Secondary | ICD-10-CM | POA: Insufficient documentation

## 2017-11-02 DIAGNOSIS — E611 Iron deficiency: Secondary | ICD-10-CM

## 2017-11-02 DIAGNOSIS — D509 Iron deficiency anemia, unspecified: Secondary | ICD-10-CM

## 2017-11-02 DIAGNOSIS — C9110 Chronic lymphocytic leukemia of B-cell type not having achieved remission: Secondary | ICD-10-CM | POA: Insufficient documentation

## 2017-11-02 DIAGNOSIS — R7989 Other specified abnormal findings of blood chemistry: Secondary | ICD-10-CM

## 2017-11-02 DIAGNOSIS — D696 Thrombocytopenia, unspecified: Secondary | ICD-10-CM

## 2017-11-02 DIAGNOSIS — D539 Nutritional anemia, unspecified: Secondary | ICD-10-CM

## 2017-11-02 LAB — CBC WITH DIFFERENTIAL/PLATELET
BASOS PCT: 0 %
Basophils Absolute: 0.1 10*3/uL (ref 0–0.1)
Eosinophils Absolute: 0.3 10*3/uL (ref 0–0.7)
Eosinophils Relative: 1 %
HEMATOCRIT: 37.8 % — AB (ref 40.0–52.0)
HEMOGLOBIN: 12.5 g/dL — AB (ref 13.0–18.0)
Lymphocytes Relative: 86 %
Lymphs Abs: 25.1 10*3/uL — ABNORMAL HIGH (ref 1.0–3.6)
MCH: 34.7 pg — ABNORMAL HIGH (ref 26.0–34.0)
MCHC: 33 g/dL (ref 32.0–36.0)
MCV: 105.2 fL — ABNORMAL HIGH (ref 80.0–100.0)
MONO ABS: 0.5 10*3/uL (ref 0.2–1.0)
Monocytes Relative: 2 %
NEUTROS ABS: 3.2 10*3/uL (ref 1.4–6.5)
NEUTROS PCT: 11 %
Platelets: 115 10*3/uL — ABNORMAL LOW (ref 150–440)
RBC: 3.6 MIL/uL — AB (ref 4.40–5.90)
RDW: 13.3 % (ref 11.5–14.5)
WBC: 29.2 10*3/uL — ABNORMAL HIGH (ref 3.8–10.6)

## 2017-11-02 NOTE — Progress Notes (Signed)
Patient here for follow up

## 2017-11-03 NOTE — Progress Notes (Signed)
Hematology/Oncology Follow Up Note [facility] Telephone:([phone] [phone]  CONSULT NOTE Patient Care Team: Tracie Harrier, MD as PCP - General (Internal Medicine)  REASON FOR VISIT Follow up for treatment of CLL and iron deficiency anemia   HISTORY OF PRESENTING ILLNESS:  Allen Chang 84 y.o.  male with PMH listed as below who was referred by primary care provider to me for evaluation of leukocytosis. He has a history of prostate cancer which was diagnosed 10 years ago. He underwent brachytherapy. He is not any castration treatment. Per patient, he follows up with Dr.Wolf and most recent PSA is normal.   Patient also reports feeling fatigue lately. Recent labs show leukocytosis, mild anemia, macrocytosis, mild thrombocytopenia, low ferritin. Denies blood in the stool. He was started on over the counter iron supplement but wife who manages patient's medication decides to only let patient to take iron medication every other day.   # received IV iron Venofer x 4. Colonoscopy was done on 02/16/2017 which showed polyps, internal hemorrhoids, negative for malignancy.   INTERVAL HISTORY Allen Chang is a 84 y.o. male who has above history reviewed by me presents for follow-up visit for management of CLL and iron deficiency anemia.  #Patient reports feeling well.  At baseline.  Denies any fatigue, weight loss, fever or chills.  He works in his farm every day. He takes oral iron supplements once a day.    ROS:  Review of Systems  Constitutional: Negative for chills, diaphoresis, fatigue, fever and unexpected weight change.  HENT:   Negative for hearing loss, lump/mass, nosebleeds and sore throat.   Eyes: Negative for eye problems and icterus.  Respiratory: Negative for chest tightness, cough, hemoptysis, shortness of breath and wheezing.   Cardiovascular: Negative for chest pain and leg swelling.  Gastrointestinal: Negative for abdominal  distention, abdominal pain, blood in stool, constipation, diarrhea, nausea and rectal pain.  Endocrine: Negative for hot flashes.  Genitourinary: Negative for bladder incontinence, difficulty urinating, dysuria, frequency, hematuria and nocturia.   Musculoskeletal: Negative for arthralgias, back pain, flank pain, gait problem and myalgias.  Skin: Negative for itching and rash.  Neurological: Negative for dizziness, gait problem, headaches, numbness and seizures.  Hematological: Negative for adenopathy. Does not bruise/bleed easily.  Psychiatric/Behavioral: Negative for confusion and decreased concentration. The patient is not nervous/anxious.     MEDICAL HISTORY:  Past Medical History:  Diagnosis Date  . Cancer [facility])    prostate   . Complication of anesthesia    bradycardia, in ICU for 24 hour after galbladder surgery.   . Diverticulosis 2012  . Dysrhythmia    Heart skips a beat  . Elevated lipids   . GERD (gastroesophageal reflux disease)   . History of hiatal hernia   . Hypertension   . Hypothyroidism   . Skin cancer    face, scalp, behind ear,back and hand  . Tremors of nervous system     SURGICAL HISTORY: Past Surgical History:  Procedure Laterality Date  . BLADDER TUMOR EXCISION    . CARDIAC CATHETERIZATION    . CHOLECYSTECTOMY N/A 10/10/2014   Procedure: LAPAROSCOPIC CHOLECYSTECTOMY WITH INTRAOPERATIVE CHOLANGIOGRAM;  Surgeon: Leonie Green, MD;  Location: [facility] ORS;  Service: General;  Laterality: N/A;  . COLONOSCOPY WITH PROPOFOL N/A 02/16/2017   Procedure: COLONOSCOPY WITH PROPOFOL;  Surgeon: Manya Silvas, MD;  Location: [facility] ENDOSCOPY;  Service: Endoscopy;  Laterality: N/A;  . ESOPHAGOGASTRODUODENOSCOPY (EGD) WITH PROPOFOL N/A 02/16/2017   Procedure: ESOPHAGOGASTRODUODENOSCOPY (EGD) WITH PROPOFOL;  Surgeon: Manya Silvas, MD;  Location: [facility] ENDOSCOPY;  Service: Endoscopy;  Laterality: N/A;  . HEMORRHOID SURGERY    . HERNIA REPAIR Left    x2  . JOINT  REPLACEMENT Left    Partial Knee Replacement, Dr. Roland Rack  . PARTIAL KNEE ARTHROPLASTY Left 12/12/2014   Procedure: UNICOMPARTMENTAL KNEE;  Surgeon: Corky Mull, MD;  Location: [facility] ORS;  Service: Orthopedics;  Laterality: Left;  . prostate seeding    . SHOULDER ARTHROSCOPY Right   . SHOULDER ARTHROSCOPY WITH OPEN ROTATOR CUFF REPAIR Left 02/07/2016   Procedure: SHOULDER ARTHROSCOPY WITH OPEN ROTATOR CUFF REPAIR;  Surgeon: Corky Mull, MD;  Location: [facility] ORS;  Service: Orthopedics;  Laterality: Left;    SOCIAL HISTORY: Social History   Socioeconomic History  . Marital status: Married    Spouse name: Not on file  . Number of children: Not on file  . Years of education: Not on file  . Highest education level: Not on file  Occupational History  . Not on file  Social Needs  . Financial resource strain: Not on file  . Food insecurity:    Worry: Not on file    Inability: Not on file  . Transportation needs:    Medical: Not on file    Non-medical: Not on file  Tobacco Use  . Smoking status: Never Smoker  . Smokeless tobacco: Never Used  Substance and Sexual Activity  . Alcohol use: No  . Drug use: No  . Sexual activity: Yes  Lifestyle  . Physical activity:    Days per week: Not on file    Minutes per session: Not on file  . Stress: Not on file  Relationships  . Social connections:    Talks on phone: Not on file    Gets together: Not on file    Attends religious service: Not on file    Active member of club or organization: Not on file    Attends meetings of clubs or organizations: Not on file    Relationship status: Not on file  . Intimate partner violence:    Fear of current or ex partner: Not on file    Emotionally abused: Not on file    Physically abused: Not on file    Forced sexual activity: Not on file  Other Topics Concern  . Not on file  Social History Narrative  . Not on file    FAMILY HISTORY: Family History  Problem Relation Age of Onset  . Bone  cancer Father   . Hypertension Mother   . Osteoporosis Mother   . Breast cancer Sister   . Cancer Brother   . Cancer Brother   . Lung cancer Brother   . Bladder Cancer Brother   . Melanoma Brother     ALLERGIES:  is allergic to percocet [oxycodone-acetaminophen] and voltaren [diclofenac sodium].  MEDICATIONS:  Current Outpatient Medications  Medication Sig Dispense Refill  . aspirin 81 MG tablet Take 81 mg by mouth daily. In am    . docusate sodium (COLACE) 100 MG capsule Take 200 mg by mouth daily.     . ferrous sulfate 325 (65 FE) MG EC tablet Take 1 tablet (325 mg total) 3 (three) times daily with meals by mouth. (Patient taking differently: Take 325 mg by mouth daily with breakfast. ) 90 tablet 0  . Flaxseed, Linseed, (FLAX SEED OIL) 1000 MG CAPS Take 1 capsule by mouth daily.    . Garlic 6503 MG CAPS Take 1 capsule  by mouth every morning.    . latanoprost (XALATAN) 0.005 % ophthalmic solution Place 1 drop into both eyes at bedtime.    Marland Kitchen levothyroxine (SYNTHROID, LEVOTHROID) 25 MCG tablet Take 25 mcg by mouth daily at 12 noon.    Marland Kitchen lisinopril (PRINIVIL,ZESTRIL) 20 MG tablet Take 10 mg by mouth daily. In am    . loratadine (CLARITIN REDITABS) 10 MG dissolvable tablet Take 10 mg by mouth daily. In am    . Misc Natural Products (BLACK CHERRY CONCENTRATE) LIQD Take 15 mLs by mouth daily.     . Multiple Vitamins-Minerals (MULTIVITAMIN WITH MINERALS) tablet Take 1 tablet by mouth daily. In am    . Omega-3 Fatty Acids (FISH OIL) 1000 MG CAPS Take 1 capsule by mouth daily.    Marland Kitchen omeprazole (PRILOSEC) 20 MG capsule Take 20 mg by mouth daily.     . polyethylene glycol (MIRALAX / GLYCOLAX) packet Take 17 g by mouth daily.    . primidone (MYSOLINE) 50 MG tablet Take 50 mg by mouth 2 (two) times daily.     No current facility-administered medications for this visit.       Marland Kitchen  PHYSICAL EXAMINATION: ECOG PERFORMANCE STATUS: 0 - Asymptomatic Vitals:   11/02/17 1400  BP: (!) 123/59    Pulse: (!) 54  Resp: 18  Temp: (!) 97.4 F (36.3 C)   Filed Weights   11/02/17 1400  Weight: 176 lb 12.8 oz (80.2 kg)   Physical Exam  Constitutional: He is oriented to person, place, and time. No distress.  HENT:  Head: Normocephalic and atraumatic.  Nose: Nose normal.  Mouth/Throat: Oropharynx is clear and moist. No oropharyngeal exudate.  Eyes: Pupils are equal, round, and reactive to light. EOM are normal. Left eye exhibits no discharge. No scleral icterus.  Neck: Normal range of motion. Neck supple.  Cardiovascular: Normal rate and regular rhythm.  No murmur heard. Pulmonary/Chest: Effort normal and breath sounds normal. No respiratory distress. He has no wheezes. He has no rales. He exhibits no tenderness.  Abdominal: Soft. Bowel sounds are normal. He exhibits no distension and no mass. There is no tenderness. There is no rebound and no guarding.  Musculoskeletal: Normal range of motion. He exhibits no edema.  Lymphadenopathy:    He has no cervical adenopathy.  Neurological: He is alert and oriented to person, place, and time. No cranial nerve deficit. He exhibits normal muscle tone. Coordination normal.  Skin: Skin is warm and dry. He is not diaphoretic. No erythema.  Psychiatric: Affect normal.  .    LABORATORY DATA:  I have reviewed the data as listed Lab Results  Component Value Date   WBC 29.2 (H) 11/02/2017   HGB 12.5 (L) 11/02/2017   HCT 37.8 (L) 11/02/2017   MCV 105.2 (H) 11/02/2017   PLT 115 (L) 11/02/2017   Recent Labs    12/29/16 1355 03/31/17 1920  NA 137 136  K 5.1 3.8  CL 102 104  CO2 24 22  GLUCOSE 109* 124*  BUN 30* 18  CREATININE 1.31* 1.30*  CALCIUM 9.4 8.4*  GFRNONAA 49* 49*  GFRAA 57* 57*  PROT 7.2 6.6  ALBUMIN 4.3 3.7  AST 23 35  ALT 19 27  ALKPHOS 67 72  BILITOT 0.7 0.9    RADIOGRAPHIC STUDIES: I have personally reviewed the radiological images as listed and agreed with the findings in the report. no recent  images. 11/04/2017  US abdomen Stable right renal cyst. Status post cholecystectomy.Previously seen lesion within the  liver is not well appreciated on this exam. No Splenomegaly.   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 Results for CCND1/IGH, ATM, 13q and TP53 were normal.    CLL patients with trisomy 12 as the sole anomaly have an intermediate prognosis. However, recent studies have  shown that trisomy 12 in conjunction with a NOTCH1 mutation, present in about 20% of patients, represents a distinct  subgroup with poor overall survival (see references below). NOTCH1 mutation analysis is not presently available at  [place]:  1. CLL (chronic lymphocytic leukemia) ([place])   2. Thrombocytopenia ([place])   3. Macrocytic anemia   4. Iron deficiency    # CLL, intermediate risk with trisomy 12. Leukocytosis slightly increased.  No hepatosplenomegaly.  Thrombocytopenia level has improved to his baseline. We will continue watchful waiting.  #Thrombocytopenia, improved to his baseline.  Continue to monitor. #Macrocytic anemia, hemoglobin stable at 12.5, MCV persistently elevated.  Normal folate and B12 level check in 2018.   Questionable underlying MDS. Continue monitor. Check folate and vitamin b12 at next visit.  #Iron deficiency, stable hemoglobin  Continue oral iron supplementation.  All questions were answered. The patient knows to call the clinic with any problems questions or concerns.  Return of visit: 3 months.  Orders Placed This Encounter  Procedures  . CBC with Differential/Platelet    Standing Status:   Future    Standing Expiration Date:   11/03/2018  . Iron and TIBC    Standing  Status:   Future    Standing Expiration Date:   11/04/2018  . Ferritin    Standing Status:   Future    Standing Expiration Date:   11/04/2018   Total face to face encounter time for this patient visit was 15 min. >50% of the time was  spent in counseling and coordination of care.    [name], MD, PhD Hematology Oncology [facility] at [facility] Pager- [phone] 11/03/2017

## 2018-02-01 ENCOUNTER — Inpatient Hospital Stay (HOSPITAL_BASED_OUTPATIENT_CLINIC_OR_DEPARTMENT_OTHER): Payer: Medicare Other | Admitting: Oncology

## 2018-02-01 ENCOUNTER — Encounter: Payer: Self-pay | Admitting: Oncology

## 2018-02-01 ENCOUNTER — Other Ambulatory Visit: Payer: Self-pay

## 2018-02-01 ENCOUNTER — Inpatient Hospital Stay: Payer: Medicare Other | Attending: Oncology

## 2018-02-01 VITALS — BP 142/67 | HR 55 | Temp 96.1°F | Resp 18 | Wt 174.2 lb

## 2018-02-01 DIAGNOSIS — D539 Nutritional anemia, unspecified: Secondary | ICD-10-CM

## 2018-02-01 DIAGNOSIS — Z8546 Personal history of malignant neoplasm of prostate: Secondary | ICD-10-CM

## 2018-02-01 DIAGNOSIS — C9110 Chronic lymphocytic leukemia of B-cell type not having achieved remission: Secondary | ICD-10-CM | POA: Insufficient documentation

## 2018-02-01 DIAGNOSIS — Z79899 Other long term (current) drug therapy: Secondary | ICD-10-CM

## 2018-02-01 DIAGNOSIS — D696 Thrombocytopenia, unspecified: Secondary | ICD-10-CM | POA: Diagnosis not present

## 2018-02-01 DIAGNOSIS — Z7982 Long term (current) use of aspirin: Secondary | ICD-10-CM | POA: Diagnosis not present

## 2018-02-01 DIAGNOSIS — D509 Iron deficiency anemia, unspecified: Secondary | ICD-10-CM | POA: Insufficient documentation

## 2018-02-01 DIAGNOSIS — I10 Essential (primary) hypertension: Secondary | ICD-10-CM | POA: Diagnosis not present

## 2018-02-01 DIAGNOSIS — E611 Iron deficiency: Secondary | ICD-10-CM

## 2018-02-01 DIAGNOSIS — Z85828 Personal history of other malignant neoplasm of skin: Secondary | ICD-10-CM | POA: Diagnosis not present

## 2018-02-01 DIAGNOSIS — K5792 Diverticulitis of intestine, part unspecified, without perforation or abscess without bleeding: Secondary | ICD-10-CM

## 2018-02-01 DIAGNOSIS — R1032 Left lower quadrant pain: Secondary | ICD-10-CM | POA: Diagnosis not present

## 2018-02-01 LAB — FERRITIN: FERRITIN: 34 ng/mL (ref 24–336)

## 2018-02-01 LAB — CBC WITH DIFFERENTIAL/PLATELET
HEMATOCRIT: 38 % — AB (ref 39.0–52.0)
Hemoglobin: 12.3 g/dL — ABNORMAL LOW (ref 13.0–17.0)
MCH: 33.3 pg (ref 26.0–34.0)
MCHC: 32.4 g/dL (ref 30.0–36.0)
MCV: 103 fL — AB (ref 80.0–100.0)
Platelets: 127 10*3/uL — ABNORMAL LOW (ref 150–400)
RBC: 3.69 MIL/uL — ABNORMAL LOW (ref 4.22–5.81)
RDW: 13.4 % (ref 11.5–15.5)
WBC: 49.9 10*3/uL — ABNORMAL HIGH (ref 4.0–10.5)
nRBC: 0 % (ref 0.0–0.2)

## 2018-02-01 LAB — DIFFERENTIAL
BASOS PCT: 0 %
Basophils Absolute: 0 10*3/uL (ref 0.0–0.1)
Eosinophils Absolute: 0.5 10*3/uL (ref 0.0–0.5)
Eosinophils Relative: 1 %
Lymphocytes Relative: 77 %
Lymphs Abs: 38.4 10*3/uL — ABNORMAL HIGH (ref 0.7–4.0)
Monocytes Absolute: 1.5 10*3/uL — ABNORMAL HIGH (ref 0.1–1.0)
Monocytes Relative: 3 %
Neutro Abs: 3.5 10*3/uL (ref 1.7–7.7)
Neutrophils Relative %: 7 %
OTHER: 12 %
Smear Review: NORMAL

## 2018-02-01 LAB — IRON AND TIBC
Iron: 52 ug/dL (ref 45–182)
SATURATION RATIOS: 18 % (ref 17.9–39.5)
TIBC: 296 ug/dL (ref 250–450)
UIBC: 244 ug/dL

## 2018-02-01 LAB — VITAMIN B12: VITAMIN B 12: 608 pg/mL (ref 180–914)

## 2018-02-01 LAB — FOLATE: FOLATE: 92 ng/mL (ref >5.9)

## 2018-02-01 LAB — PATHOLOGIST SMEAR REVIEW

## 2018-02-01 MED ORDER — CIPROFLOXACIN HCL 500 MG PO TABS: 500.0000 mg | ORAL_TABLET | Freq: Two times a day (BID) | ORAL | 0 refills | Status: DC

## 2018-02-01 MED ORDER — METRONIDAZOLE 500 MG PO TABS: 500.0000 mg | ORAL_TABLET | Freq: Three times a day (TID) | ORAL | 0 refills | Status: DC

## 2018-02-01 NOTE — Progress Notes (Signed)
Hematology/Oncology Follow Up Note [facility] [phone] [phone]  CONSULT NOTE Patient Care Team: Tracie Harrier, MD as PCP - General (Internal Medicine)  REASON FOR VISIT Follow up for treatment of CLL and iron deficiency anemia   HISTORY OF PRESENTING ILLNESS:  Allen Chang 84 y.o.  male with PMH listed as below who was referred by primary care provider to me for evaluation of leukocytosis. He has a history of prostate cancer which was diagnosed 10 years ago. He underwent brachytherapy. He is not any castration treatment. Per patient, he follows up with Dr.Wolf and most recent PSA is normal.   Patient also reports feeling fatigue lately. Recent labs show leukocytosis, mild anemia, macrocytosis, mild thrombocytopenia, low ferritin. Denies blood in the stool. He was started on over the counter iron supplement but wife who manages patient's medication decides to only let patient to take iron medication every other day.   # received IV iron Venofer x 4. Colonoscopy was done on 02/16/2017 which showed polyps, internal hemorrhoids, negative for malignancy.   INTERVAL HISTORY Allen Chang is a 84 y.o. male who has above history reviewed by me presents for follow-up visit for management of CLL and iron deficiency anemia.  #Reports having left lower quadrant pain for a few days. Not associated with any fever, chills, diarrhea, blood in stool.  Otherwise no new complaints. Has history of multiple diverticulitis and he feels the pain mimics previous diverticulitis pain.  Fatigue level is at baseline.   ROS:  Review of Systems  Constitutional: Negative for appetite change, chills, fatigue, fever and unexpected weight change.  HENT:   Negative for hearing loss and voice change.   Eyes: Negative for eye problems and icterus.  Respiratory: Negative for chest tightness, cough and shortness of breath.   Cardiovascular: Negative for chest pain  and leg swelling.  Gastrointestinal: Positive for abdominal pain. Negative for abdominal distention.  Endocrine: Negative for hot flashes.  Genitourinary: Negative for difficulty urinating, dysuria and frequency.   Musculoskeletal: Negative for arthralgias.  Skin: Negative for itching and rash.  Neurological: Negative for light-headedness and numbness.  Hematological: Negative for adenopathy. Does not bruise/bleed easily.  Psychiatric/Behavioral: Negative for confusion.    MEDICAL HISTORY:  Past Medical History:  Diagnosis Date  . Cancer [facility])    prostate   . Complication of anesthesia    bradycardia, in ICU for 24 hour after galbladder surgery.   . Diverticulosis 2012  . Dysrhythmia    Heart skips a beat  . Elevated lipids   . GERD (gastroesophageal reflux disease)   . History of hiatal hernia   . Hypertension   . Hypothyroidism   . Skin cancer    face, scalp, behind ear,back and hand  . Tremors of nervous system     SURGICAL HISTORY: Past Surgical History:  Procedure Laterality Date  . BLADDER TUMOR EXCISION    . CARDIAC CATHETERIZATION    . CHOLECYSTECTOMY N/A 10/10/2014   Procedure: LAPAROSCOPIC CHOLECYSTECTOMY WITH INTRAOPERATIVE CHOLANGIOGRAM;  Surgeon: Leonie Green, MD;  Location: [facility] ORS;  Service: General;  Laterality: N/A;  . COLONOSCOPY WITH PROPOFOL N/A 02/16/2017   Procedure: COLONOSCOPY WITH PROPOFOL;  Surgeon: Manya Silvas, MD;  Location: [facility] ENDOSCOPY;  Service: Endoscopy;  Laterality: N/A;  . ESOPHAGOGASTRODUODENOSCOPY (EGD) WITH PROPOFOL N/A 02/16/2017   Procedure: ESOPHAGOGASTRODUODENOSCOPY (EGD) WITH PROPOFOL;  Surgeon: Manya Silvas, MD;  Location: [facility] ENDOSCOPY;  Service: Endoscopy;  Laterality: N/A;  . HEMORRHOID SURGERY    .  HERNIA REPAIR Left    x2  . JOINT REPLACEMENT Left    Partial Knee Replacement, Dr. Roland Rack  . PARTIAL KNEE ARTHROPLASTY Left 12/12/2014   Procedure: UNICOMPARTMENTAL KNEE;  Surgeon: Corky Mull, MD;   Location: [facility] ORS;  Service: Orthopedics;  Laterality: Left;  . prostate seeding    . SHOULDER ARTHROSCOPY Right   . SHOULDER ARTHROSCOPY WITH OPEN ROTATOR CUFF REPAIR Left 02/07/2016   Procedure: SHOULDER ARTHROSCOPY WITH OPEN ROTATOR CUFF REPAIR;  Surgeon: Corky Mull, MD;  Location: [facility] ORS;  Service: Orthopedics;  Laterality: Left;    SOCIAL HISTORY: Social History   Socioeconomic History  . Marital status: Married    Spouse name: Not on file  . Number of children: Not on file  . Years of education: Not on file  . Highest education level: Not on file  Occupational History  . Not on file  Social Needs  . Financial resource strain: Not on file  . Food insecurity:    Worry: Not on file    Inability: Not on file  . Transportation needs:    Medical: Not on file    Non-medical: Not on file  Tobacco Use  . Smoking status: Never Smoker  . Smokeless tobacco: Never Used  Substance and Sexual Activity  . Alcohol use: No  . Drug use: No  . Sexual activity: Yes  Lifestyle  . Physical activity:    Days per week: Not on file    Minutes per session: Not on file  . Stress: Not on file  Relationships  . Social connections:    Talks on phone: Not on file    Gets together: Not on file    Attends religious service: Not on file    Active member of club or organization: Not on file    Attends meetings of clubs or organizations: Not on file    Relationship status: Not on file  . Intimate partner violence:    Fear of current or ex partner: Not on file    Emotionally abused: Not on file    Physically abused: Not on file    Forced sexual activity: Not on file  Other Topics Concern  . Not on file  Social History Narrative  . Not on file    FAMILY HISTORY: Family History  Problem Relation Age of Onset  . Bone cancer Father   . Hypertension Mother   . Osteoporosis Mother   . Breast cancer Sister   . Cancer Brother   . Cancer Brother   . Lung cancer Brother   . Bladder  Cancer Brother   . Melanoma Brother     ALLERGIES:  is allergic to percocet [oxycodone-acetaminophen] and voltaren [diclofenac sodium].  MEDICATIONS:  Current Outpatient Medications  Medication Sig Dispense Refill  . aspirin 81 MG tablet Take 81 mg by mouth daily. In am    . docusate sodium (COLACE) 100 MG capsule Take 200 mg by mouth daily.     . ferrous sulfate 325 (65 FE) MG EC tablet Take 1 tablet (325 mg total) 3 (three) times daily with meals by mouth. (Patient taking differently: Take 325 mg by mouth daily with breakfast. ) 90 tablet 0  . Flaxseed, Linseed, (FLAX SEED OIL) 1000 MG CAPS Take 1 capsule by mouth daily.    . Garlic 1660 MG CAPS Take 1 capsule by mouth every morning.    . latanoprost (XALATAN) 0.005 % ophthalmic solution Place 1 drop into both eyes at bedtime.    Marland Kitchen  levothyroxine (SYNTHROID, LEVOTHROID) 25 MCG tablet Take 25 mcg by mouth daily at 12 noon.    Marland Kitchen lisinopril (PRINIVIL,ZESTRIL) 20 MG tablet Take 10 mg by mouth daily. In am    . loratadine (CLARITIN REDITABS) 10 MG dissolvable tablet Take 10 mg by mouth daily. In am    . Misc Natural Products (BLACK CHERRY CONCENTRATE) LIQD Take 15 mLs by mouth daily.     . Multiple Vitamins-Minerals (MULTIVITAMIN WITH MINERALS) tablet Take 1 tablet by mouth daily. In am    . Omega-3 Fatty Acids (FISH OIL) 1000 MG CAPS Take 1 capsule by mouth daily.    Marland Kitchen omeprazole (PRILOSEC) 20 MG capsule Take 20 mg by mouth daily.     . polyethylene glycol (MIRALAX / GLYCOLAX) packet Take 17 g by mouth daily.    . primidone (MYSOLINE) 50 MG tablet Take 50 mg by mouth 2 (two) times daily.    . ciprofloxacin (CIPRO) 500 MG tablet Take 1 tablet (500 mg total) by mouth 2 (two) times daily. 14 tablet 0  . metroNIDAZOLE (FLAGYL) 500 MG tablet Take 1 tablet (500 mg total) by mouth 3 (three) times daily. 21 tablet 0   No current facility-administered medications for this visit.       Marland Kitchen  PHYSICAL EXAMINATION: ECOG PERFORMANCE STATUS: 0 -  Asymptomatic Vitals:   02/01/18 1318  BP: (!) 142/67  Pulse: (!) 55  Resp: 18  Temp: (!) 96.1 F (35.6 C)   Filed Weights   02/01/18 1318  Weight: 174 lb 3.2 oz (79 kg)   Physical Exam  Constitutional: He is oriented to person, place, and time. No distress.  HENT:  Head: Normocephalic and atraumatic.  Nose: Nose normal.  Mouth/Throat: Oropharynx is clear and moist. No oropharyngeal exudate.  Eyes: Pupils are equal, round, and reactive to light. EOM are normal. No scleral icterus.  Neck: Normal range of motion. Neck supple.  Cardiovascular: Normal rate and regular rhythm.  No murmur heard. Pulmonary/Chest: Effort normal. No respiratory distress. He has no rales. He exhibits no tenderness.  Abdominal: Soft. He exhibits no distension. There is abdominal tenderness.  Left lower quadrant tenderness  Musculoskeletal: Normal range of motion.        General: No edema.  Neurological: He is alert and oriented to person, place, and time. No cranial nerve deficit. He exhibits normal muscle tone. Coordination normal.  Skin: Skin is warm and dry. He is not diaphoretic. No erythema.  Psychiatric: Affect normal.  .    LABORATORY DATA:  I have reviewed the data as listed Lab Results  Component Value Date   WBC 49.9 (H) 02/01/2018   HGB 12.3 (L) 02/01/2018   HCT 38.0 (L) 02/01/2018   MCV 103.0 (H) 02/01/2018   PLT 127 (L) 02/01/2018   Recent Labs    03/31/17 1920  NA 136  K 3.8  CL 104  CO2 22  GLUCOSE 124*  BUN 18  CREATININE 1.30*  CALCIUM 8.4*  GFRNONAA 49*  GFRAA 57*  PROT 6.6  ALBUMIN 3.7  AST 35  ALT 27  ALKPHOS 72  BILITOT 0.9    RADIOGRAPHIC STUDIES: I have personally reviewed the radiological images as listed and agreed with the findings in the report. no recent images. 11/04/2017  US abdomen Stable right renal cyst. Status post cholecystectomy.Previously seen lesion within the liver is not well appreciated on this exam. No Splenomegaly.   Peripheral  Flowcytometry Chronic lymphocytic leukemia, B cell, CD38 positive (78%).  Cytogenetics revealed Trisomy 51  CLL RELATED CLONE DETECTED   The CLL interphase fluorescence in situ hybridization (FISH) panel analysis was positive for three chromosome 12  centromere signals consistent with trisomy 12. Results for CCND1/IGH, ATM, 13q and TP53 were normal.    CLL patients with trisomy 12 as the sole anomaly have an intermediate prognosis. However, recent studies have  shown that trisomy 12 in conjunction with a NOTCH1 mutation, present in about 20% of patients, represents a distinct  subgroup with poor overall survival (see references below). NOTCH1 mutation analysis is not presently available at  [place]:  1. CLL (chronic lymphocytic leukemia) ([place])   2. Iron deficiency   3. Diverticulitis   4. Macrocytic anemia    # CLL, intermediate risk with trisomy 12. Leukocytosis has increased, likely due to acute infection.  We will continue watchful waiting.  #Left lower quadrant pain, likely due to recurrent diverticulitis.  I send oral antibiotics with Cipro and Flagyl for 7 days.  I discussed with patient that if symptoms do not improve or get worse, he needs to call his PCP for further management. He has follow up appointment with PCP in 2 weeks.   #Macrocytic anemia, hemoglobin stable at 12.5, MCV persistently elevated.  Normal folate and B12 level check in 2018.   Questionable underlying MDS. pending folate and vitamin b12 levels #Iron deficiency, stable hemoglobin  Continue oral iron supplements.    The patient knows to call the clinic with any problems questions or concerns.  Return of visit: 3 months.  Orders Placed This Encounter  Procedures  . Differential  . Pathologist smear review  Total face to face encounter time for this patient visit was 25 min. >50% of the time was  spent in counseling and coordination of care.    [name], MD, PhD Hematology  Oncology [facility] at [facility] Pager- [phone] 02/01/2018

## 2018-02-01 NOTE — Progress Notes (Signed)
Patient here for follow up. He states he has diverticulitis and he will be seeing Dr. Ginette Pitman.

## 2018-02-15 ENCOUNTER — Other Ambulatory Visit: Payer: Self-pay | Admitting: Internal Medicine

## 2018-02-15 DIAGNOSIS — R1032 Left lower quadrant pain: Secondary | ICD-10-CM

## 2018-02-19 ENCOUNTER — Other Ambulatory Visit: Payer: Self-pay

## 2018-02-19 ENCOUNTER — Emergency Department: Payer: Medicare Other

## 2018-02-19 ENCOUNTER — Encounter: Payer: Self-pay | Admitting: Emergency Medicine

## 2018-02-19 ENCOUNTER — Emergency Department
Admission: EM | Admit: 2018-02-19 | Discharge: 2018-02-19 | Disposition: A | Discharge status: Home / Self Care | Payer: Medicare Other | Attending: Emergency Medicine | Admitting: Emergency Medicine

## 2018-02-19 DIAGNOSIS — I10 Essential (primary) hypertension: Secondary | ICD-10-CM | POA: Diagnosis not present

## 2018-02-19 DIAGNOSIS — E039 Hypothyroidism, unspecified: Secondary | ICD-10-CM | POA: Insufficient documentation

## 2018-02-19 DIAGNOSIS — M7061 Trochanteric bursitis, right hip: Secondary | ICD-10-CM

## 2018-02-19 DIAGNOSIS — Z7982 Long term (current) use of aspirin: Secondary | ICD-10-CM | POA: Insufficient documentation

## 2018-02-19 DIAGNOSIS — M25551 Pain in right hip: Secondary | ICD-10-CM | POA: Diagnosis present

## 2018-02-19 DIAGNOSIS — Y939 Activity, unspecified: Secondary | ICD-10-CM | POA: Insufficient documentation

## 2018-02-19 DIAGNOSIS — Z79899 Other long term (current) drug therapy: Secondary | ICD-10-CM | POA: Insufficient documentation

## 2018-02-19 MED ORDER — PREDNISONE 10 MG PO TABS: 10.0000 mg | ORAL_TABLET | Freq: Every day | ORAL | 0 refills | Status: DC

## 2018-02-19 MED ORDER — DEXAMETHASONE SODIUM PHOSPHATE 10 MG/ML IJ SOLN
10.0000 mg | Freq: Once | INTRAMUSCULAR | Status: AC
  Administered 2018-02-19: 10 mg via INTRAMUSCULAR
  Filled 2018-02-19: qty 1

## 2018-02-19 NOTE — ED Provider Notes (Signed)
[facility] Emergency Department Provider Note  ____________________________________________  Time seen: Approximately 4:17 PM  I have reviewed the triage vital signs and the nursing notes.   HISTORY  Chief Complaint Hip Pain    HPI Allen Chang is a 85 y.o. male who presents the emergency department complaining of sharp right hip pain.  Patient reports that over the past month to month and a half he is developed increasing right hip pain.  Majority of the pain is described in the greater trochanter region.  Typically a burning or sharp sensation.  It is worse with movement or ambulation.  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  Patient did not sustain a fall but since then he has had excruciating pain to the lateral right hip.  Patient is able to ambulate but doing so drastically increases pain.  He reports no pain at rest only with weightbearing or movement.  He denies any groin pain.  He denies any radicular symptoms.  Patient denies any lower back, urinary, GI complaints.  Patient has a history of thrombocytopenia, iron deficiency anemia, CLL, macrocytic anemia, prostate cancer, diverticulosis, GERD, hypertension.  No complaints with chronic medical problems.    Past Medical History:  Diagnosis Date  . Cancer [facility])    prostate   . Complication of anesthesia    bradycardia, in ICU for 24 hour after galbladder surgery.   . Diverticulosis 2012  . Dysrhythmia    Heart skips a beat  . Elevated lipids   . GERD (gastroesophageal reflux disease)   . History of hiatal hernia   . Hypertension   . Hypothyroidism   . Skin cancer    face, scalp, behind ear,back and hand  . Tremors of nervous system     Patient Active Problem List   Diagnosis Date Noted  . Thrombocytopenia ([place]) 10/27/2016  . Iron deficiency 10/27/2016  . CLL (chronic lymphocytic  leukemia) ([place]) 10/27/2016  . Macrocytic anemia 10/27/2016  . Status post left partial knee replacement 12/12/2014  . Bradycardia 10/10/2014  . Severe sinus bradycardia 10/10/2014    Past Surgical History:  Procedure Laterality Date  . BLADDER TUMOR EXCISION    . CARDIAC CATHETERIZATION    . CHOLECYSTECTOMY N/A 10/10/2014   Procedure: LAPAROSCOPIC CHOLECYSTECTOMY WITH INTRAOPERATIVE CHOLANGIOGRAM;  Surgeon: Leonie Green, MD;  Location: [facility] ORS;  Service: General;  Laterality: N/A;  . COLONOSCOPY WITH PROPOFOL N/A 02/16/2017   Procedure: COLONOSCOPY WITH PROPOFOL;  Surgeon: Manya Silvas, MD;  Location: [facility] ENDOSCOPY;  Service: Endoscopy;  Laterality: N/A;  . ESOPHAGOGASTRODUODENOSCOPY (EGD) WITH PROPOFOL N/A 02/16/2017   Procedure: ESOPHAGOGASTRODUODENOSCOPY (EGD) WITH PROPOFOL;  Surgeon: Manya Silvas, MD;  Location: [facility] ENDOSCOPY;  Service: Endoscopy;  Laterality: N/A;  . HEMORRHOID SURGERY    . HERNIA REPAIR Left    x2  . JOINT REPLACEMENT Left    Partial Knee Replacement, Dr. Roland Rack  . PARTIAL KNEE ARTHROPLASTY Left 12/12/2014   Procedure: UNICOMPARTMENTAL KNEE;  Surgeon: Corky Mull, MD;  Location: [facility] ORS;  Service: Orthopedics;  Laterality: Left;  . prostate seeding    . SHOULDER ARTHROSCOPY Right   . SHOULDER ARTHROSCOPY WITH OPEN ROTATOR CUFF REPAIR Left 02/07/2016   Procedure: SHOULDER ARTHROSCOPY WITH OPEN ROTATOR CUFF REPAIR;  Surgeon: Corky Mull, MD;  Location: [facility] ORS;  Service: Orthopedics;  Laterality: Left;    Prior to Admission  medications   Medication Sig Start Date End Date Taking? Authorizing Provider  amoxicillin-clavulanate (AUGMENTIN) 875-125 MG tablet Take 1 tablet by mouth 2 (two) times daily.   Yes [provider]  dicyclomine (BENTYL) 10 MG capsule Take 10 mg by mouth 4 (four) times daily -  before meals and at bedtime.   Yes [provider]  aspirin 81 MG tablet Take 81 mg by mouth daily. In am    [provider]  ciprofloxacin (CIPRO) 500 MG tablet Take 1 tablet (500 mg total) by mouth 2 (two) times daily. 02/01/18   [name], MD  docusate sodium (COLACE) 100 MG capsule Take 200 mg by mouth daily.     [provider]  ferrous sulfate 325 (65 FE) MG EC tablet Take 1 tablet (325 mg total) 3 (three) times daily with meals by mouth. Patient taking differently: Take 325 mg by mouth daily with breakfast.  12/29/16   [name], MD  Flaxseed, Linseed, (FLAX SEED OIL) 1000 MG CAPS Take 1 capsule by mouth daily.    [provider]  Garlic 7858 MG CAPS Take 1 capsule by mouth every morning.    [provider]  latanoprost (XALATAN) 0.005 % ophthalmic solution Place 1 drop into both eyes at bedtime.    [provider]  levothyroxine (SYNTHROID, LEVOTHROID) 25 MCG tablet Take 25 mcg by mouth daily at 12 noon. 04/02/16   [provider]  lisinopril (PRINIVIL,ZESTRIL) 20 MG tablet Take 10 mg by mouth daily. In am    [provider]  loratadine (CLARITIN REDITABS) 10 MG dissolvable tablet Take 10 mg by mouth daily. In am    [provider]  metroNIDAZOLE (FLAGYL) 500 MG tablet Take 1 tablet (500 mg total) by mouth 3 (three) times daily. 02/01/18   [name], MD  Misc Natural Products (BLACK CHERRY CONCENTRATE) LIQD Take 15 mLs by mouth daily.     [provider]  Multiple Vitamins-Minerals (MULTIVITAMIN WITH MINERALS) tablet Take 1 tablet by mouth daily. In am    [provider]  Omega-3 Fatty Acids (FISH OIL) 1000 MG CAPS Take 1 capsule by mouth daily.    [provider]  omeprazole (PRILOSEC) 20 MG capsule Take 20 mg by mouth daily.     [provider]  polyethylene glycol (MIRALAX / GLYCOLAX) packet Take 17 g by mouth daily.    [provider]  predniSONE (DELTASONE) 10 MG tablet Take 1 tablet (10 mg total) by mouth daily. 02/19/18   Duru Reiger, Charline Bills, PA-C  primidone (MYSOLINE) 50 MG tablet Take 50  mg by mouth 2 (two) times daily.    [provider]    Allergies Percocet [oxycodone-acetaminophen] and Voltaren [diclofenac sodium]  Family History  Problem Relation Age of Onset  . Bone cancer Father   . Hypertension Mother   . Osteoporosis Mother   . Breast cancer Sister   . Cancer Brother   . Cancer Brother   . Lung cancer Brother   . Bladder Cancer Brother   . Melanoma Brother     Social History Social History   Tobacco Use  . Smoking status: Never Smoker  . Smokeless tobacco: Never Used  Substance Use Topics  . Alcohol use: No  . Drug use: No     Review of Systems  Constitutional: No fever/chills Eyes: No visual changes.  Cardiovascular: no chest pain. Respiratory: no cough. No SOB. Gastrointestinal: No abdominal pain.  No nausea, no vomiting.  No  diarrhea.  No constipation. Genitourinary: Negative for dysuria. No hematuria Musculoskeletal: Positive for right hip pain. Skin: Negative for rash, abrasions, lacerations, ecchymosis. Neurological: Negative for headaches, focal weakness or numbness. 10-point ROS otherwise negative.  ____________________________________________   PHYSICAL EXAM:  VITAL SIGNS: ED Triage Vitals  Enc Vitals Group     BP 02/19/18 1531 137/74     Pulse Rate 02/19/18 1531 64     Resp 02/19/18 1531 20     Temp 02/19/18 1531 98.3 F (36.8 C)     Temp Source 02/19/18 1531 Oral     SpO2 02/19/18 1531 100 %     Weight 02/19/18 1531 176 lb (79.8 kg)     Height 02/19/18 1531 5\' 9"  (1.753 m)     Head Circumference --      Peak Flow --      Pain Score 02/19/18 1541 7     Pain Loc --      Pain Edu? --      Excl. in [place]? --      Constitutional: Alert and oriented. Well appearing and in no acute distress. Eyes: Conjunctivae are normal. PERRL. EOMI. Head: Atraumatic. Neck: No stridor.    Cardiovascular: Normal rate, regular rhythm. Normal S1 and S2.  Good peripheral circulation. Respiratory: Normal respiratory effort  without tachypnea or retractions. Lungs CTAB. Good air entry to the bases with no decreased or absent breath sounds. Musculoskeletal: Full range of motion to all extremities. No gross deformities appreciated.  Visualization of the right hip reveals no gross visible abnormality to include ecchymosis, edema, deformity.  Patient is able to flex, extend, abduct and adduct his hip.  Patient ambulated prior to my detailed physical exam.  He is favoring the right hip significantly.  On palpation, patient is nontender to palpation to the posterior or anterior hip or femur.  Patient is very tender to palpation over the greater trochanteric region.  No inguinal tenderness.  No palpable abnormality on exam.  Dorsalis pedis pulse intact distally.  Sensation intact distally. Neurologic:  Normal speech and language. No gross focal neurologic deficits are appreciated.  Skin:  Skin is warm, dry and intact. No rash noted. Psychiatric: Mood and affect are normal. Speech and behavior are normal. Patient exhibits appropriate insight and judgement.   ____________________________________________   LABS (all labs ordered are listed, but only abnormal results are displayed)  Labs Reviewed - No data to display ____________________________________________  EKG   ____________________________________________  RADIOLOGY I personally viewed and evaluated these images as part of my medical decision making, as well as reviewing the written report by the radiologist.  I concur with radiologist finding of osteoarthritic changes of the right hip.  No acute osseous abnormality.  Dg Hip Unilat W Or Wo Pelvis 2-3 Views Right  Result Date: 02/19/2018 CLINICAL DATA:  Acute right hip pain since twisting injury 2 days ago. EXAM: DG HIP (WITH OR WITHOUT PELVIS) 2-3V RIGHT COMPARISON:  CT abdomen pelvis dated June 07, 2017. FINDINGS: No acute fracture or dislocation. Bilateral hip joint space narrowing is similar to prior CT.  Osteopenia. Degenerative changes of the lower lumbar spine. Unchanged brachytherapy seeds in the prostate. IMPRESSION: 1.  No acute osseous abnormality. 2. Unchanged mild bilateral hip osteoarthritis. Electronically Signed   By: Titus Dubin M.D.   On: 02/19/2018 16:48    ____________________________________________    PROCEDURES  Procedure(s) performed:    Procedures    Medications  dexamethasone (DECADRON) injection 10 mg (has no administration in time range)  ____________________________________________   INITIAL IMPRESSION / ASSESSMENT AND PLAN / ED COURSE  Pertinent labs & imaging results that were available during my care of the patient were reviewed by me and considered in my medical decision making (see chart for details).  Review of the [place] CSRS was performed in accordance of the [place] prior to dispensing any controlled drugs.  Clinical Course as of Feb 20 1727  Fri Feb 19, 2018  1622 Patient presents the emergency department with significant right hip pain.  Patient reports he has had intermittent issues with his hip, over the past month and a half has had ongoing pain to the lateral aspect.  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  Patient reports that he accidentally stepped in a hole and had a twisting action to his right hip.  Since then, patient has had a drastic increase in hip pain.  He reports he is able to ambulate but with difficulty.  Patient has an orthopedic surgeon but is unable to be seen for the next week.  Patient is concerned that he may fall from his hip giving out due to the pain.  At this time, I will order an x-ray to evaluate for occult fracture.  Patient has no other concerning signs and does not warrant further work-up.  Differential includes occult fracture, hip strain, bursitis.   [JC]    Clinical Course User Index [JC] Krew Hortman, Charline Bills, PA-C     Patient's diagnosis is consistent with  trochanteric bursitis right side.  Patient presents emergency department complaining of sharp right lateral hip pain.  Patient is tender over the trochanter right side.  X-ray reveals no acute osseous abnormality.  Exam was otherwise reassuring.  Patient will be given injection of steroid IM as well as prednisone taper.  Patient has an appointment to see orthopedics in 1 week.  He should follow-up with orthopedics at that time.. Patient is given ED precautions to return to the ED for any worsening or new symptoms.     ____________________________________________  FINAL CLINICAL IMPRESSION(S) / ED DIAGNOSES  Final diagnoses:  Trochanteric bursitis of right hip      NEW MEDICATIONS STARTED DURING THIS VISIT:  ED Discharge Orders         Ordered    predniSONE (DELTASONE) 10 MG tablet  Daily    Note to Pharmacy:  Take 6 pills x 2 days, 5 pills x 2 days, 4 pills x 2 days, 3 pills x 2 days, 2 pills x 2 days, and 1 pill x 2 days   02/19/18 1728              This chart was dictated using voice recognition software/Dragon. Despite best efforts to proofread, errors can occur which can change the meaning. Any change was purely unintentional.    Darletta Moll, PA-C 02/19/18 1729    Nance Pear, MD 02/19/18 [phone]

## 2018-02-19 NOTE — ED Notes (Signed)
Patient transported to X-ray 

## 2018-02-19 NOTE — ED Triage Notes (Signed)
Pt presents via pov from home with right hip pain. States he has been having pain x 2 years, but that he stepped in a hold and twisted his leg a few days ago, and the pain has been much worse. Pt states he is almost unable to bear weight and is afraid he is going to fall. NAD Noted.

## 2018-02-25 ENCOUNTER — Ambulatory Visit
Admission: RE | Admit: 2018-02-25 | Discharge: 2018-02-25 | Disposition: A | Discharge status: Home / Self Care | Payer: Medicare Other | Source: Ambulatory Visit | Attending: Internal Medicine | Admitting: Internal Medicine

## 2018-02-25 DIAGNOSIS — R1032 Left lower quadrant pain: Secondary | ICD-10-CM | POA: Diagnosis not present

## 2018-05-03 ENCOUNTER — Inpatient Hospital Stay: Payer: Medicare Other | Admitting: Oncology

## 2018-05-03 ENCOUNTER — Inpatient Hospital Stay: Payer: Medicare Other

## 2018-05-26 ENCOUNTER — Inpatient Hospital Stay (HOSPITAL_BASED_OUTPATIENT_CLINIC_OR_DEPARTMENT_OTHER): Payer: Medicare Other | Admitting: Oncology

## 2018-05-26 ENCOUNTER — Inpatient Hospital Stay: Payer: Medicare Other | Admitting: Oncology

## 2018-05-26 ENCOUNTER — Inpatient Hospital Stay: Payer: Medicare Other

## 2018-05-26 ENCOUNTER — Encounter: Payer: Self-pay | Admitting: Oncology

## 2018-05-26 ENCOUNTER — Other Ambulatory Visit: Payer: Self-pay

## 2018-05-26 ENCOUNTER — Inpatient Hospital Stay: Payer: Medicare Other | Attending: Oncology

## 2018-05-26 VITALS — BP 113/63 | HR 60 | Temp 98.1°F | Resp 18 | Wt 172.3 lb

## 2018-05-26 DIAGNOSIS — D539 Nutritional anemia, unspecified: Secondary | ICD-10-CM

## 2018-05-26 DIAGNOSIS — E611 Iron deficiency: Secondary | ICD-10-CM | POA: Insufficient documentation

## 2018-05-26 DIAGNOSIS — Z79899 Other long term (current) drug therapy: Secondary | ICD-10-CM | POA: Diagnosis not present

## 2018-05-26 DIAGNOSIS — I129 Hypertensive chronic kidney disease with stage 1 through stage 4 chronic kidney disease, or unspecified chronic kidney disease: Secondary | ICD-10-CM | POA: Diagnosis not present

## 2018-05-26 DIAGNOSIS — Z85828 Personal history of other malignant neoplasm of skin: Secondary | ICD-10-CM

## 2018-05-26 DIAGNOSIS — N183 Chronic kidney disease, stage 3 unspecified: Secondary | ICD-10-CM

## 2018-05-26 DIAGNOSIS — C9110 Chronic lymphocytic leukemia of B-cell type not having achieved remission: Secondary | ICD-10-CM

## 2018-05-26 DIAGNOSIS — D696 Thrombocytopenia, unspecified: Secondary | ICD-10-CM

## 2018-05-26 DIAGNOSIS — K5792 Diverticulitis of intestine, part unspecified, without perforation or abscess without bleeding: Secondary | ICD-10-CM

## 2018-05-26 DIAGNOSIS — D709 Neutropenia, unspecified: Secondary | ICD-10-CM

## 2018-05-26 LAB — COMPREHENSIVE METABOLIC PANEL
ALT: 19 U/L (ref 0–44)
ALT: 19 U/L (ref 0–44)
AST: 21 U/L (ref 15–41)
AST: 21 U/L (ref 15–41)
Albumin: 4 g/dL (ref 3.5–5.0)
Albumin: 4.1 g/dL (ref 3.5–5.0)
Alkaline Phosphatase: 77 U/L (ref 38–126)
Alkaline Phosphatase: 82 U/L (ref 38–126)
Anion gap: 6 (ref 5–15)
Anion gap: 8 (ref 5–15)
BUN: 36 mg/dL — ABNORMAL HIGH (ref 8–23)
BUN: 37 mg/dL — ABNORMAL HIGH (ref 8–23)
CO2: 27 mmol/L (ref 22–32)
CO2: 28 mmol/L (ref 22–32)
Calcium: 9.1 mg/dL (ref 8.9–10.3)
Calcium: 9.1 mg/dL (ref 8.9–10.3)
Chloride: 107 mmol/L (ref 98–111)
Chloride: 107 mmol/L (ref 98–111)
Creatinine, Ser: 1.3 mg/dL — ABNORMAL HIGH (ref 0.61–1.24)
Creatinine, Ser: 1.47 mg/dL — ABNORMAL HIGH (ref 0.61–1.24)
GFR calc Af Amer: 50 mL/min — ABNORMAL LOW (ref >60)
GFR calc Af Amer: 58 mL/min — ABNORMAL LOW (ref >60)
GFR calc non Af Amer: 43 mL/min — ABNORMAL LOW (ref >60)
GFR calc non Af Amer: 50 mL/min — ABNORMAL LOW (ref >60)
Glucose, Bld: 110 mg/dL — ABNORMAL HIGH (ref 70–99)
Glucose, Bld: 115 mg/dL — ABNORMAL HIGH (ref 70–99)
Potassium: 4.1 mmol/L (ref 3.5–5.1)
Potassium: 4.1 mmol/L (ref 3.5–5.1)
Sodium: 141 mmol/L (ref 135–145)
Sodium: 142 mmol/L (ref 135–145)
Total Bilirubin: 0.7 mg/dL (ref 0.3–1.2)
Total Bilirubin: 0.8 mg/dL (ref 0.3–1.2)
Total Protein: 6.2 g/dL — ABNORMAL LOW (ref 6.5–8.1)
Total Protein: 6.6 g/dL (ref 6.5–8.1)

## 2018-05-26 LAB — CBC WITH DIFFERENTIAL/PLATELET
Abs Immature Granulocytes: 0 10*3/uL (ref 0.00–0.07)
Basophils Absolute: 0 10*3/uL (ref 0.0–0.1)
Basophils Relative: 0 %
Eosinophils Absolute: 0 10*3/uL (ref 0.0–0.5)
Eosinophils Relative: 0 %
HCT: 35.9 % — ABNORMAL LOW (ref 39.0–52.0)
Hemoglobin: 11.8 g/dL — ABNORMAL LOW (ref 13.0–17.0)
Lymphocytes Relative: 95 %
Lymphs Abs: 45.4 10*3/uL — ABNORMAL HIGH (ref 0.7–4.0)
MCH: 34.6 pg — ABNORMAL HIGH (ref 26.0–34.0)
MCHC: 32.9 g/dL (ref 30.0–36.0)
MCV: 105.3 fL — ABNORMAL HIGH (ref 80.0–100.0)
Monocytes Absolute: 1 10*3/uL (ref 0.1–1.0)
Monocytes Relative: 2 %
Neutro Abs: 1.4 10*3/uL — ABNORMAL LOW (ref 1.7–7.7)
Neutrophils Relative %: 3 %
Platelets: 117 10*3/uL — ABNORMAL LOW (ref 150–400)
RBC: 3.41 MIL/uL — ABNORMAL LOW (ref 4.22–5.81)
RDW: 13.1 % (ref 11.5–15.5)
Smear Review: NORMAL
WBC Morphology: ABNORMAL
WBC: 47.8 10*3/uL — ABNORMAL HIGH (ref 4.0–10.5)
nRBC: 0 % (ref 0.0–0.2)

## 2018-05-26 LAB — LACTATE DEHYDROGENASE: LDH: 109 U/L (ref 98–192)

## 2018-05-26 NOTE — Progress Notes (Signed)
Patient here for follow up. No concerns voiced.  °

## 2018-05-27 ENCOUNTER — Other Ambulatory Visit: Payer: Self-pay | Admitting: *Deleted

## 2018-05-27 DIAGNOSIS — C9110 Chronic lymphocytic leukemia of B-cell type not having achieved remission: Secondary | ICD-10-CM | POA: Diagnosis not present

## 2018-05-27 DIAGNOSIS — E611 Iron deficiency: Secondary | ICD-10-CM

## 2018-05-27 LAB — IRON AND TIBC
Iron: 138 ug/dL (ref 45–182)
Saturation Ratios: 50 % — ABNORMAL HIGH (ref 17.9–39.5)
TIBC: 276 ug/dL (ref 250–450)
UIBC: 138 ug/dL

## 2018-05-27 LAB — FERRITIN: Ferritin: 43 ng/mL (ref 24–336)

## 2018-05-27 NOTE — Progress Notes (Signed)
Hematology/Oncology Follow Up Note [facility] [phone] [phone]  CONSULT NOTE Patient Care Team: Tracie Harrier, MD as PCP - General (Internal Medicine)  REASON FOR VISIT 4 months follow up for CLL and iron deficiency anemia  HISTORY OF PRESENTING ILLNESS:  Allen Chang 85 y.o.  male with PMH listed as below who was referred by primary care provider to me for evaluation of leukocytosis. He has a history of prostate cancer which was diagnosed 10 years ago. He underwent brachytherapy. He is not any castration treatment. Per patient, he follows up with Dr.Wolf and most recent PSA is normal.   Patient also reports feeling fatigue lately. Recent labs show leukocytosis, mild anemia, macrocytosis, mild thrombocytopenia, low ferritin. Denies blood in the stool. He was started on over the counter iron supplement but wife who manages patient's medication decides to only let patient to take iron medication every other day.   # received IV iron Venofer x 4. Colonoscopy was done on 02/16/2017 which showed polyps, internal hemorrhoids, negative for malignancy.   INTERVAL HISTORY Allen Chang is a 85 y.o. male who has above history reviewed by me presents for 4 months follow up for CLL and iron deficiency anemia, macrocytic anemia. .  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  # CLL, Denies unintentional weight loss, fever, chills, fatigue, night sweats.  Since last visit he had ane ED visit due to sharp right hip pain. Was diagnosed with trochanteric bursitis right side.   X-ray reveals no acute osseous abnormality. He also had constipation, and he followed his friend's [ who is a retired physician] suggestion and took buttermilk and symptoms resolved.  Hip pain has improved.  Today he denies any sob, chest pain or abdominal pain.  No recent infection, or hospitalization.     ROS:  Review of Systems  Constitutional: Negative for  appetite change, chills, fatigue, fever and unexpected weight change.  HENT:   Negative for hearing loss and voice change.   Eyes: Negative for eye problems and icterus.  Respiratory: Negative for chest tightness, cough and shortness of breath.   Cardiovascular: Negative for chest pain and leg swelling.  Gastrointestinal: Negative for abdominal distention and abdominal pain.  Endocrine: Negative for hot flashes.  Genitourinary: Negative for difficulty urinating, dysuria and frequency.   Musculoskeletal: Negative for arthralgias.  Skin: Negative for itching and rash.  Neurological: Negative for light-headedness and numbness.  Hematological: Negative for adenopathy. Does not bruise/bleed easily.  Psychiatric/Behavioral: Negative for confusion.    MEDICAL HISTORY:  Past Medical History:  Diagnosis Date   Cancer [facility])    prostate    Complication of anesthesia    bradycardia, in ICU for 24 hour after galbladder surgery.    Diverticulosis 2012   Dysrhythmia    Heart skips a beat   Elevated lipids    GERD (gastroesophageal reflux disease)    History of hiatal hernia    Hypertension    Hypothyroidism    Skin cancer    face, scalp, behind ear,back and hand   Tremors of nervous system     SURGICAL HISTORY: Past Surgical History:  Procedure Laterality Date   BLADDER TUMOR EXCISION     CARDIAC CATHETERIZATION     CHOLECYSTECTOMY N/A 10/10/2014   Procedure: LAPAROSCOPIC CHOLECYSTECTOMY WITH INTRAOPERATIVE CHOLANGIOGRAM;  Surgeon: Leonie Green, MD;  Location: [facility] ORS;  Service: General;  Laterality: N/A;   COLONOSCOPY WITH PROPOFOL N/A 02/16/2017   Procedure: COLONOSCOPY WITH PROPOFOL;  Surgeon:  Manya Silvas, MD;  Location: [facility] ENDOSCOPY;  Service: Endoscopy;  Laterality: N/A;   ESOPHAGOGASTRODUODENOSCOPY (EGD) WITH PROPOFOL N/A 02/16/2017   Procedure: ESOPHAGOGASTRODUODENOSCOPY (EGD) WITH PROPOFOL;  Surgeon: Manya Silvas, MD;  Location: [facility]  ENDOSCOPY;  Service: Endoscopy;  Laterality: N/A;   HEMORRHOID SURGERY     HERNIA REPAIR Left    x2   JOINT REPLACEMENT Left    Partial Knee Replacement, Dr. Roland Rack   PARTIAL KNEE ARTHROPLASTY Left 12/12/2014   Procedure: UNICOMPARTMENTAL KNEE;  Surgeon: Corky Mull, MD;  Location: [facility] ORS;  Service: Orthopedics;  Laterality: Left;   prostate seeding     SHOULDER ARTHROSCOPY Right    SHOULDER ARTHROSCOPY WITH OPEN ROTATOR CUFF REPAIR Left 02/07/2016   Procedure: SHOULDER ARTHROSCOPY WITH OPEN ROTATOR CUFF REPAIR;  Surgeon: Corky Mull, MD;  Location: [facility] ORS;  Service: Orthopedics;  Laterality: Left;    SOCIAL HISTORY: Social History   Socioeconomic History   Marital status: Married    Spouse name: Not on file   Number of children: Not on file   Years of education: Not on file   Highest education level: Not on file  Occupational History   Not on file  Social Needs   Financial resource strain: Not on file   Food insecurity:    Worry: Not on file    Inability: Not on file   Transportation needs:    Medical: Not on file    Non-medical: Not on file  Tobacco Use   Smoking status: Never Smoker   Smokeless tobacco: Never Used  Substance and Sexual Activity   Alcohol use: No   Drug use: No   Sexual activity: Yes  Lifestyle   Physical activity:    Days per week: Not on file    Minutes per session: Not on file   Stress: Not on file  Relationships   Social connections:    Talks on phone: Not on file    Gets together: Not on file    Attends religious service: Not on file    Active member of club or organization: Not on file    Attends meetings of clubs or organizations: Not on file    Relationship status: Not on file   Intimate partner violence:    Fear of current or ex partner: Not on file    Emotionally abused: Not on file    Physically abused: Not on file    Forced sexual activity: Not on file  Other Topics Concern   Not on file  Social  History Narrative   Not on file    FAMILY HISTORY: Family History  Problem Relation Age of Onset   Bone cancer Father    Hypertension Mother    Osteoporosis Mother    Breast cancer Sister    Cancer Brother    Cancer Brother    Lung cancer Brother    Bladder Cancer Brother    Melanoma Brother     ALLERGIES:  is allergic to percocet [oxycodone-acetaminophen] and voltaren [diclofenac sodium].  MEDICATIONS:  Current Outpatient Medications  Medication Sig Dispense Refill   aspirin 81 MG tablet Take 81 mg by mouth daily. In am     dicyclomine (BENTYL) 10 MG capsule Take 10 mg by mouth 4 (four) times daily -  before meals and at bedtime.     docusate sodium (COLACE) 100 MG capsule Take 200 mg by mouth daily.      ferrous sulfate 325 (65 FE) MG EC tablet Take  1 tablet (325 mg total) 3 (three) times daily with meals by mouth. (Patient taking differently: Take 325 mg by mouth daily with breakfast. ) 90 tablet 0   Flaxseed, Linseed, (FLAX SEED OIL) 1000 MG CAPS Take 1 capsule by mouth daily.     gabapentin (NEURONTIN) 100 MG capsule Take 100 mg by mouth 2 (two) times daily.     Garlic 6761 MG CAPS Take 1 capsule by mouth every morning.     latanoprost (XALATAN) 0.005 % ophthalmic solution Place 1 drop into both eyes at bedtime.     levothyroxine (SYNTHROID, LEVOTHROID) 25 MCG tablet Take 25 mcg by mouth daily at 12 noon.     lisinopril (PRINIVIL,ZESTRIL) 20 MG tablet Take 10 mg by mouth daily. In am     loratadine (CLARITIN REDITABS) 10 MG dissolvable tablet Take 10 mg by mouth daily. In am     Misc Natural Products (BLACK CHERRY CONCENTRATE) LIQD Take 15 mLs by mouth daily.      Multiple Vitamins-Minerals (MULTIVITAMIN WITH MINERALS) tablet Take 1 tablet by mouth daily. In am     Omega-3 Fatty Acids (FISH OIL) 1000 MG CAPS Take 1 capsule by mouth daily.     omeprazole (PRILOSEC) 20 MG capsule Take 20 mg by mouth daily.      polyethylene glycol (MIRALAX /  GLYCOLAX) packet Take 17 g by mouth daily.     primidone (MYSOLINE) 50 MG tablet Take 50 mg by mouth 2 (two) times daily.     amoxicillin-clavulanate (AUGMENTIN) 875-125 MG tablet Take 1 tablet by mouth 2 (two) times daily.     ciprofloxacin (CIPRO) 500 MG tablet Take 1 tablet (500 mg total) by mouth 2 (two) times daily. (Patient not taking: Reported on 05/26/2018) 14 tablet 0   metroNIDAZOLE (FLAGYL) 500 MG tablet Take 1 tablet (500 mg total) by mouth 3 (three) times daily. (Patient not taking: Reported on 05/26/2018) 21 tablet 0   predniSONE (DELTASONE) 10 MG tablet Take 1 tablet (10 mg total) by mouth daily. (Patient not taking: Reported on 05/26/2018) 42 tablet 0   No current facility-administered medications for this visit.       Marland Kitchen  PHYSICAL EXAMINATION: ECOG PERFORMANCE STATUS: 0 - Asymptomatic Vitals:   05/26/18 1115  BP: 113/63  Pulse: 60  Resp: 18  Temp: 98.1 F (36.7 C)   Filed Weights   05/26/18 1115  Weight: 172 lb 4.8 oz (78.2 kg)   Physical Exam  Constitutional: He is oriented to person, place, and time. No distress.  HENT:  Head: Normocephalic and atraumatic.  Nose: Nose normal.  Mouth/Throat: Oropharynx is clear and moist. No oropharyngeal exudate.  Eyes: Pupils are equal, round, and reactive to light. EOM are normal. No scleral icterus.  Neck: Normal range of motion. Neck supple.  Cardiovascular: Normal rate and regular rhythm.  No murmur heard. Pulmonary/Chest: Effort normal. No respiratory distress. He has no rales. He exhibits no tenderness.  Abdominal: Soft. He exhibits no distension. There is no abdominal tenderness.  Musculoskeletal: Normal range of motion.        General: No edema.  Neurological: He is alert and oriented to person, place, and time. No cranial nerve deficit. He exhibits normal muscle tone. Coordination normal.  Skin: Skin is warm and dry. He is not diaphoretic. No erythema.  Psychiatric: Affect normal.  .    LABORATORY DATA:  I  have reviewed the data as listed Lab Results  Component Value Date   WBC 47.8 (H) 05/26/2018  HGB 11.8 (L) 05/26/2018   HCT 35.9 (L) 05/26/2018   MCV 105.3 (H) 05/26/2018   PLT 117 (L) 05/26/2018   Recent Labs    05/26/18 1032 05/26/18 1048  NA 141 142  K 4.1 4.1  CL 107 107  CO2 28 27  GLUCOSE 110* 115*  BUN 36* 37*  CREATININE 1.30* 1.47*  CALCIUM 9.1 9.1  GFRNONAA 50* 43*  GFRAA 58* 50*  PROT 6.2* 6.6  ALBUMIN 4.0 4.1  AST 21 21  ALT 19 19  ALKPHOS 82 77  BILITOT 0.8 0.7    RADIOGRAPHIC STUDIES: I have personally reviewed the radiological images as listed and agreed with the findings in the report. no recent images. 11/04/2017  US abdomen Stable right renal cyst. Status post cholecystectomy.Previously seen lesion within the liver is not well appreciated on this exam. No Splenomegaly.   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 Results for CCND1/IGH, ATM, 13q and TP53 were normal.    CLL patients with trisomy 12 as the sole anomaly have an intermediate prognosis. However, recent studies have  shown that trisomy 12 in conjunction with a NOTCH1 mutation, present in about 20% of patients, represents a distinct  subgroup with poor overall survival (see references below). NOTCH1 mutation analysis is not presently available at  [place]:  1. CLL (chronic lymphocytic leukemia) ([place])   2. Iron deficiency   3. Macrocytic anemia   4. Thrombocytopenia ([place])   5. Neutropenia, unspecified type ([place])   6. CKD (chronic kidney disease) stage 3, GFR 30-59 ml/min (H[facility])    # CLL, intermediate risk with trisomy 12. Labs are reviewed and discussed with patient.  He is asymptomatic,  will continue surveillance Q3 months.  Watchful waiting.   # Thrombocytopenia, chronic problem for him, platelet counts slightly lower than his baseline.  Continue monitor. No bleeding events.  Previous work up includes negative hepatitis panel, HIV, normal B12 and folate  # macrocytic anemia, Normal vitamin B12 and folate level in Dec 2019.  Hemoglobin 11.8, slightly lower.  Questionable underlying MDS.  Monitor for now.   # History of iron deficiency,  He takes oral iron supplementation.  Iron level was reviewed which showed increased TSAT to 50.  Advise patient to stop taking oral iron supplementation.   # Mild neutropenia, ANC 1.4, this is a new problem.  He is asymptomatic. no frequent infection.  Continue to monitor.   # CKD, Stage 3. Continue to monitor. Avoid NSAIDs I will obtain SPEP.Marland Kitchen   Return of visit: 3 months.  Orders Placed This Encounter  Procedures   Iron and TIBC    Standing Status:   Future    Number of Occurrences:   1    Standing Expiration Date:   05/27/2019   Ferritin    Standing Status:   Future    Number of Occurrences:   1    Standing Expiration Date:   05/27/2019   CBC with Differential/Platelet    Standing Status:   Future    Standing Expiration Date:   05/27/2019   Comprehensive metabolic panel    Standing Status:   Future    Standing Expiration Date:   05/27/2019   Lactate dehydrogenase    Standing Status:  Future    Standing Expiration Date:   05/27/2019   Retic Panel    Standing Status:   Future    Standing Expiration Date:   05/27/2019   Immature Platelet Fraction    Standing Status:   Future    Standing Expiration Date:   05/27/2019   Protein electrophoresis, serum    Standing Status:   Future    Standing Expiration Date:   05/27/2019    [name], MD, PhD Hematology Oncology [facility] at [facility] Pager- [phone] 05/27/2018

## 2018-07-26 ENCOUNTER — Inpatient Hospital Stay: Payer: Medicare Other

## 2018-07-26 ENCOUNTER — Telehealth: Payer: Self-pay | Admitting: Oncology

## 2018-07-26 ENCOUNTER — Inpatient Hospital Stay: Payer: Medicare Other | Admitting: Oncology

## 2018-08-25 ENCOUNTER — Other Ambulatory Visit: Payer: Self-pay

## 2018-08-25 ENCOUNTER — Inpatient Hospital Stay: Payer: Medicare Other | Attending: Oncology

## 2018-08-25 ENCOUNTER — Inpatient Hospital Stay (HOSPITAL_BASED_OUTPATIENT_CLINIC_OR_DEPARTMENT_OTHER): Payer: Medicare Other | Admitting: Oncology

## 2018-08-25 ENCOUNTER — Encounter: Payer: Self-pay | Admitting: Oncology

## 2018-08-25 ENCOUNTER — Inpatient Hospital Stay: Payer: Medicare Other

## 2018-08-25 VITALS — BP 100/53 | HR 64 | Temp 97.5°F | Wt 170.5 lb

## 2018-08-25 DIAGNOSIS — C9110 Chronic lymphocytic leukemia of B-cell type not having achieved remission: Secondary | ICD-10-CM

## 2018-08-25 DIAGNOSIS — E611 Iron deficiency: Secondary | ICD-10-CM | POA: Insufficient documentation

## 2018-08-25 DIAGNOSIS — D539 Nutritional anemia, unspecified: Secondary | ICD-10-CM | POA: Diagnosis not present

## 2018-08-25 DIAGNOSIS — Z8546 Personal history of malignant neoplasm of prostate: Secondary | ICD-10-CM | POA: Diagnosis not present

## 2018-08-25 DIAGNOSIS — N183 Chronic kidney disease, stage 3 unspecified: Secondary | ICD-10-CM

## 2018-08-25 DIAGNOSIS — D696 Thrombocytopenia, unspecified: Secondary | ICD-10-CM

## 2018-08-25 DIAGNOSIS — D709 Neutropenia, unspecified: Secondary | ICD-10-CM

## 2018-08-25 LAB — RETIC PANEL
Immature Retic Fract: 20 % — ABNORMAL HIGH (ref 2.3–15.9)
RBC.: 2.83 MIL/uL — ABNORMAL LOW (ref 4.22–5.81)
Retic Count, Absolute: 58 10*3/uL (ref 19.0–186.0)
Retic Ct Pct: 2.1 % (ref 0.4–3.1)
Reticulocyte Hemoglobin: 38.2 pg (ref >27.9)

## 2018-08-25 LAB — CBC WITH DIFFERENTIAL/PLATELET
Abs Immature Granulocytes: 0.17 10*3/uL — ABNORMAL HIGH (ref 0.00–0.07)
Basophils Absolute: 0.2 10*3/uL — ABNORMAL HIGH (ref 0.0–0.1)
Basophils Relative: 0 %
Eosinophils Absolute: 0.3 10*3/uL (ref 0.0–0.5)
Eosinophils Relative: 1 %
HCT: 30.9 % — ABNORMAL LOW (ref 39.0–52.0)
Hemoglobin: 9.8 g/dL — ABNORMAL LOW (ref 13.0–17.0)
Immature Granulocytes: 0 %
Lymphocytes Relative: 86 %
Lymphs Abs: 51.7 10*3/uL — ABNORMAL HIGH (ref 0.7–4.0)
MCH: 34.6 pg — ABNORMAL HIGH (ref 26.0–34.0)
MCHC: 31.7 g/dL (ref 30.0–36.0)
MCV: 109.2 fL — ABNORMAL HIGH (ref 80.0–100.0)
Monocytes Absolute: 4.7 10*3/uL — ABNORMAL HIGH (ref 0.1–1.0)
Monocytes Relative: 8 %
Neutro Abs: 3.2 10*3/uL (ref 1.7–7.7)
Neutrophils Relative %: 5 %
Platelets: 133 10*3/uL — ABNORMAL LOW (ref 150–400)
RBC: 2.83 MIL/uL — ABNORMAL LOW (ref 4.22–5.81)
RDW: 13.3 % (ref 11.5–15.5)
Smear Review: NORMAL
WBC Morphology: ABNORMAL
WBC: 60.2 10*3/uL (ref 4.0–10.5)
nRBC: 0 % (ref 0.0–0.2)

## 2018-08-25 LAB — COMPREHENSIVE METABOLIC PANEL
ALT: 15 U/L (ref 0–44)
AST: 20 U/L (ref 15–41)
Albumin: 4 g/dL (ref 3.5–5.0)
Alkaline Phosphatase: 65 U/L (ref 38–126)
Anion gap: 10 (ref 5–15)
BUN: 38 mg/dL — ABNORMAL HIGH (ref 8–23)
CO2: 24 mmol/L (ref 22–32)
Calcium: 8.9 mg/dL (ref 8.9–10.3)
Chloride: 107 mmol/L (ref 98–111)
Creatinine, Ser: 1.52 mg/dL — ABNORMAL HIGH (ref 0.61–1.24)
GFR calc Af Amer: 48 mL/min — ABNORMAL LOW (ref >60)
GFR calc non Af Amer: 41 mL/min — ABNORMAL LOW (ref >60)
Glucose, Bld: 152 mg/dL — ABNORMAL HIGH (ref 70–99)
Potassium: 4.8 mmol/L (ref 3.5–5.1)
Sodium: 141 mmol/L (ref 135–145)
Total Bilirubin: 0.7 mg/dL (ref 0.3–1.2)
Total Protein: 6.1 g/dL — ABNORMAL LOW (ref 6.5–8.1)

## 2018-08-25 LAB — LACTATE DEHYDROGENASE: LDH: 99 U/L (ref 98–192)

## 2018-08-25 LAB — IMMATURE PLATELET FRACTION: Immature Platelet Fraction: 10.2 % — ABNORMAL HIGH (ref 1.2–8.6)

## 2018-08-25 NOTE — Progress Notes (Signed)
Patient here today for follow up.  Patient states that, other than his hip pain from arthritis, he feels good.

## 2018-08-27 LAB — PROTEIN ELECTROPHORESIS, SERUM
A/G Ratio: 1.8 — ABNORMAL HIGH (ref 0.7–1.7)
Albumin ELP: 3.8 g/dL (ref 2.9–4.4)
Alpha-1-Globulin: 0.2 g/dL (ref 0.0–0.4)
Alpha-2-Globulin: 0.7 g/dL (ref 0.4–1.0)
Beta Globulin: 0.6 g/dL — ABNORMAL LOW (ref 0.7–1.3)
Gamma Globulin: 0.6 g/dL (ref 0.4–1.8)
Globulin, Total: 2.1 g/dL — ABNORMAL LOW (ref 2.2–3.9)
M-Spike, %: 0.3 g/dL — ABNORMAL HIGH
Total Protein ELP: 5.9 g/dL — ABNORMAL LOW (ref 6.0–8.5)

## 2018-08-27 NOTE — Progress Notes (Signed)
Hematology/Oncology Follow Up Note [facility] Telephone:([phone] [phone]  CONSULT NOTE Patient Care Team: Tracie Harrier, MD as PCP - General (Internal Medicine)  REASON FOR VISIT 4 months follow up for CLL and iron deficiency anemia  HISTORY OF PRESENTING ILLNESS:  Allen Chang 85 y.o.  male with PMH listed as below who was referred by primary care provider to me for evaluation of leukocytosis. He has a history of prostate cancer which was diagnosed 10 years ago. He underwent brachytherapy. He is not any castration treatment. Per patient, he follows up with Dr.Wolf and most recent PSA is normal.   Patient also reports feeling fatigue lately. Recent labs show leukocytosis, mild anemia, macrocytosis, mild thrombocytopenia, low ferritin. Denies blood in the stool. He was started on over the counter iron supplement but wife who manages patient's medication decides to only let patient to take iron medication every other day.   # received IV iron Venofer x 4. Colonoscopy was done on 02/16/2017 which showed polyps, internal hemorrhoids, negative for malignancy.   INTERVAL HISTORY Allen Chang is a 85 y.o. male who has above history reviewed by me presents for 4 months follow up for CLL and iron deficiency anemia, macrocytic anemia. . Patient reports that he feels well at baseline except having back/ hip pain, Patient had x-ray lumbar spine and pelvis done at [facility]. Images showed moderate multilevel degenerative changes involving the upper and middle portion of the lumbar spine.    Denies any fever, chills, night sweating, or fatigue. Appetite is good.  He has lost 2 pounds since April 2020. Denies any abdominal pain.  Or shortness of breath.    Review of Systems  Constitutional: Negative for appetite change, chills, fatigue, fever and unexpected weight change.  HENT:   Negative for hearing loss and voice change.    Eyes: Negative for eye problems and icterus.  Respiratory: Negative for chest tightness, cough and shortness of breath.   Cardiovascular: Negative for chest pain and leg swelling.  Gastrointestinal: Negative for abdominal distention and abdominal pain.  Endocrine: Negative for hot flashes.  Genitourinary: Negative for difficulty urinating, dysuria and frequency.   Musculoskeletal: Positive for back pain. Negative for arthralgias.  Skin: Negative for itching and rash.  Neurological: Negative for light-headedness and numbness.  Hematological: Negative for adenopathy. Does not bruise/bleed easily.  Psychiatric/Behavioral: Negative for confusion.    MEDICAL HISTORY:  Past Medical History:  Diagnosis Date  . Cancer [facility])    prostate   . Complication of anesthesia    bradycardia, in ICU for 24 hour after galbladder surgery.   . Diverticulosis 2012  . Dysrhythmia    Heart skips a beat  . Elevated lipids   . GERD (gastroesophageal reflux disease)   . History of hiatal hernia   . Hypertension   . Hypothyroidism   . Skin cancer    face, scalp, behind ear,back and hand  . Tremors of nervous system     SURGICAL HISTORY: Past Surgical History:  Procedure Laterality Date  . BLADDER TUMOR EXCISION    . CARDIAC CATHETERIZATION    . CHOLECYSTECTOMY N/A 10/10/2014   Procedure: LAPAROSCOPIC CHOLECYSTECTOMY WITH INTRAOPERATIVE CHOLANGIOGRAM;  Surgeon: Leonie Green, MD;  Location: [facility] ORS;  Service: General;  Laterality: N/A;  . COLONOSCOPY WITH PROPOFOL N/A 02/16/2017   Procedure: COLONOSCOPY WITH PROPOFOL;  Surgeon: Manya Silvas, MD;  [facility] END[facility]COPY;  Service: Endoscopy;  Laterality: N/A;  . ESOPHAGOGASTRODUODENOSCOPY (EGD) WITH PROPOFOL  N/A 02/16/2017   Procedure: ESOPHAGOGASTRODUODENOSCOPY (EGD) WITH PROPOFOL;  Surgeon: Manya Silvas, MD;  Location: [facility] ENDOSCOPY;  Service: Endoscopy;  Laterality: N/A;  . HEMORRHOID SURGERY    . HERNIA REPAIR Left    x2  .  JOINT REPLACEMENT Left    Partial Knee Replacement, Dr. Roland Rack  . PARTIAL KNEE ARTHROPLASTY Left 12/12/2014   Procedure: UNICOMPARTMENTAL KNEE;  Surgeon: Corky Mull, MD;  Location: [facility] ORS;  Service: Orthopedics;  Laterality: Left;  . prostate seeding    . SHOULDER ARTHROSCOPY Right   . SHOULDER ARTHROSCOPY WITH OPEN ROTATOR CUFF REPAIR Left 02/07/2016   Procedure: SHOULDER ARTHROSCOPY WITH OPEN ROTATOR CUFF REPAIR;  Surgeon: Corky Mull, MD;  Location: [facility] ORS;  Service: Orthopedics;  Laterality: Left;    SOCIAL HISTORY: Social History   Socioeconomic History  . Marital status: Married    Spouse name: Not on file  . Number of children: Not on file  . Years of education: Not on file  . Highest education level: Not on file  Occupational History  . Not on file  Social Needs  . Financial resource strain: Not on file  . Food insecurity    Worry: Not on file    Inability: Not on file  . Transportation needs    Medical: Not on file    Non-medical: Not on file  Tobacco Use  . Smoking status: Never Smoker  . Smokeless tobacco: Never Used  Substance and Sexual Activity  . Alcohol use: No  . Drug use: No  . Sexual activity: Yes  Lifestyle  . Physical activity    Days per week: Not on file    Minutes per session: Not on file  . Stress: Not on file  Relationships  . Social Herbalist on phone: Not on file    Gets together: Not on file    Attends religious service: Not on file    Active member of club or organization: Not on file    Attends meetings of clubs or organizations: Not on file    Relationship status: Not on file  . Intimate partner violence    Fear of current or ex partner: Not on file    Emotionally abused: Not on file    Physically abused: Not on file    Forced sexual activity: Not on file  Other Topics Concern  . Not on file  Social History Narrative  . Not on file    FAMILY HISTORY: Family History  Problem Relation Age of Onset  . Bone  cancer Father   . Hypertension Mother   . Osteoporosis Mother   . Breast cancer Sister   . Cancer Brother   . Cancer Brother   . Lung cancer Brother   . Bladder Cancer Brother   . Melanoma Brother     ALLERGIES:  is allergic to oxycodone-acetaminophen; percocet [oxycodone-acetaminophen]; and voltaren [diclofenac sodium].  MEDICATIONS:  Current Outpatient Medications  Medication Sig Dispense Refill  . amoxicillin-clavulanate (AUGMENTIN) 875-125 MG tablet Take 1 tablet by mouth 2 (two) times daily.    Marland Kitchen aspirin 81 MG tablet Take 81 mg by mouth daily. In am    . dicyclomine (BENTYL) 10 MG capsule Take 10 mg by mouth 4 (four) times daily -  before meals and at bedtime.    . docusate sodium (COLACE) 100 MG capsule Take 200 mg by mouth daily.     . Flaxseed, Linseed, (FLAX SEED OIL) 1000 MG CAPS Take 1  capsule by mouth daily.    Marland Kitchen gabapentin (NEURONTIN) 100 MG capsule Take 100 mg by mouth 2 (two) times daily.    . Garlic 1287 MG CAPS Take 1 capsule by mouth every morning.    . latanoprost (XALATAN) 0.005 % ophthalmic solution Place 1 drop into both eyes at bedtime.    Marland Kitchen levothyroxine (SYNTHROID, LEVOTHROID) 25 MCG tablet Take 25 mcg by mouth daily at 12 noon.    Marland Kitchen lisinopril (PRINIVIL,ZESTRIL) 20 MG tablet Take 10 mg by mouth daily. In am    . loratadine (CLARITIN REDITABS) 10 MG dissolvable tablet Take 10 mg by mouth daily. In am    . Misc Natural Products (BLACK CHERRY CONCENTRATE) LIQD Take 15 mLs by mouth daily.     . Multiple Vitamins-Minerals (MULTIVITAMIN WITH MINERALS) tablet Take 1 tablet by mouth daily. In am    . Omega-3 Fatty Acids (FISH OIL) 1000 MG CAPS Take 1 capsule by mouth daily.    Marland Kitchen omeprazole (PRILOSEC) 20 MG capsule Take 20 mg by mouth daily.     . polyethylene glycol (MIRALAX / GLYCOLAX) packet Take 17 g by mouth daily.    . primidone (MYSOLINE) 50 MG tablet Take 50 mg by mouth 2 (two) times daily.    . traMADol (ULTRAM) 50 MG tablet Take 50 mg by mouth.     No  current facility-administered medications for this visit.       Marland Kitchen  PHYSICAL EXAMINATION: ECOG PERFORMANCE STATUS: 0 - Asymptomatic Vitals:   08/25/18 1349  BP: (!) 100/53  Pulse: 64  Temp: (!) 97.5 F (36.4 C)   Filed Weights   08/25/18 1349  Weight: 170 lb 8 oz (77.3 kg)   Physical Exam  Constitutional: He is oriented to person, place, and time. No distress.  HENT:  Head: Normocephalic and atraumatic.  Nose: Nose normal.  Mouth/Throat: Oropharynx is clear and moist. No oropharyngeal exudate.  Eyes: Pupils are equal, round, and reactive to light. EOM are normal. No scleral icterus.  Neck: Normal range of motion. Neck supple.  Cardiovascular: Normal rate and regular rhythm.  No murmur heard. Pulmonary/Chest: Effort normal. No respiratory distress. He has no rales. He exhibits no tenderness.  Abdominal: Soft. He exhibits no distension. There is no abdominal tenderness.  Musculoskeletal: Normal range of motion.        General: No edema.  Neurological: He is alert and oriented to person, place, and time. No cranial nerve deficit. He exhibits normal muscle tone. Coordination normal.  Skin: Skin is warm and dry. He is not diaphoretic. No erythema.  Psychiatric: Affect normal.  .    LABORATORY DATA:  I have reviewed the data as listed Lab Results  Component Value Date   WBC 60.2 (HH) 08/25/2018   HGB 9.8 (L) 08/25/2018   HCT 30.9 (L) 08/25/2018   MCV 109.2 (H) 08/25/2018   PLT 133 (L) 08/25/2018   Recent Labs    05/26/18 1032 05/26/18 1048 08/25/18 1258  NA 141 142 141  K 4.1 4.1 4.8  CL 107 107 107  CO2 _0 GLUCOSE 110* 115* 152*  BUN 36* 37* 38*  CREATININE 1.30* 1.47* 1.52*  CALCIUM 9.1 9.1 8.9  GFRNONAA 50* 43* 41*  GFRAA 58* 50* 48*  PROT 6.2* 6.6 6.1*  ALBUMIN 4.0 4.1 4.0  AST _1 ALT _2 ALKPHOS 82 77 65  BILITOT 0.8 0.7 0.7    RADIOGRAPHIC STUDIES: I have personally reviewed the radiological  images as listed and agreed with  the findings in the report. no recent images. 11/04/2017  US abdomen Stable right renal cyst. Status post cholecystectomy.Previously seen lesion within the liver is not well appreciated on this exam. No Splenomegaly.   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 Results for CCND1/IGH, ATM, 13q and TP53 were normal.    CLL patients with trisomy 12 as the sole anomaly have an intermediate prognosis. However, recent studies have  shown that trisomy 12 in conjunction with a NOTCH1 mutation, present in about 20% of patients, represents a distinct  subgroup with poor overall survival (see references below). NOTCH1 mutation analysis is not presently available at  [place]:  1. CLL (chronic lymphocytic leukemia) ([place])   2. Iron deficiency   3. Macrocytic anemia   4. CKD (chronic kidney disease) stage 3, GFR 30-59 ml/min (HCC)   5. Thrombocytopenia ([place])    # CLL, intermediate risk with trisomy 12. Previously Rai stage 0 Labs reviewed and discussed with patient. Hemoglobin dropped to 9.8.  If this is considered to be secondary to CLL progression, then patient belongs to stage III CLL.  May need to consider starting treatments. Check molecular testing IGVH mutation status.   However anemia can be also secondary to CKD. I will check protein electrophoresis for plasma cell dyscrasia etiology  # Thrombocytopenia, chronic problem for him, stable. Continue monitor. No bleeding events.  Previous work up includes negative hepatitis panel, HIV, normal B12 and folate  # macrocytic anemia, Normal vitamin B12 and folate level in Dec 2019.  No significant hemolysis.  Questionable underlying MDS.    #  History of iron deficiency,  Iron levels restricting April 2020.  No iron deficiency.  # CKD, Stage 3. Continue to monitor. Avoid NSAIDs I will obtain SPEP.Marland Kitchen   Return of visit: 3 months.  Orders Placed This Encounter  Procedures  . Miscellaneous [facility] test (send-out)    Standing Status:   Future    Number of Occurrences:   1    Standing Expiration Date:   08/25/2019    Order Specific Question:   Test name / description:    Answer:   [id number] [facility]  . Miscellaneous [facility] test (send-out)    Standing Status:   Future    Number of Occurrences:   1    Standing Expiration Date:   08/25/2019    Order Specific Question:   Test name / description:    Answer:   [facility] [id number]   We spent sufficient time to discuss many aspect of care, questions were answered to patient's satisfaction. Total face to face encounter time for this patient visit was 25 min. >50% of the time was  spent in counseling and coordination of care.   [name], MD, PhD

## 2018-08-31 ENCOUNTER — Telehealth: Payer: Self-pay | Admitting: Oncology

## 2018-08-31 NOTE — Telephone Encounter (Signed)
Called wife and updated her about the plan.  Progression of CLL  awaiting for blood work to decide which chemotherapy regimen to start with. Wife appreciates the call and explanation.

## 2018-09-01 LAB — MISC LABCORP TEST (SEND OUT)
LabCorp test name: 510340
Labcorp test code: 510340

## 2018-09-02 LAB — MISC LABCORP TEST (SEND OUT)
LabCorp test name: 113753
Labcorp test code: 113753

## 2018-09-13 ENCOUNTER — Other Ambulatory Visit: Payer: Self-pay | Admitting: Oncology

## 2018-09-13 DIAGNOSIS — C9110 Chronic lymphocytic leukemia of B-cell type not having achieved remission: Secondary | ICD-10-CM

## 2018-09-21 ENCOUNTER — Other Ambulatory Visit: Payer: Self-pay | Admitting: Student

## 2018-09-22 ENCOUNTER — Ambulatory Visit
Admission: RE | Admit: 2018-09-22 | Discharge: 2018-09-22 | Disposition: A | Discharge status: Home / Self Care | Payer: Medicare Other | Source: Ambulatory Visit | Attending: Oncology | Admitting: Oncology

## 2018-09-22 ENCOUNTER — Other Ambulatory Visit: Payer: Self-pay

## 2018-09-22 ENCOUNTER — Other Ambulatory Visit (HOSPITAL_COMMUNITY)
Admission: RE | Admit: 2018-09-22 | Disposition: A | Discharge status: Home / Self Care | Payer: Medicare Other | Source: Ambulatory Visit | Attending: Oncology | Admitting: Oncology

## 2018-09-22 DIAGNOSIS — Z8052 Family history of malignant neoplasm of bladder: Secondary | ICD-10-CM | POA: Insufficient documentation

## 2018-09-22 DIAGNOSIS — Z8546 Personal history of malignant neoplasm of prostate: Secondary | ICD-10-CM | POA: Insufficient documentation

## 2018-09-22 DIAGNOSIS — E785 Hyperlipidemia, unspecified: Secondary | ICD-10-CM | POA: Diagnosis not present

## 2018-09-22 DIAGNOSIS — Z79890 Hormone replacement therapy (postmenopausal): Secondary | ICD-10-CM | POA: Diagnosis not present

## 2018-09-22 DIAGNOSIS — I10 Essential (primary) hypertension: Secondary | ICD-10-CM | POA: Diagnosis not present

## 2018-09-22 DIAGNOSIS — Z7982 Long term (current) use of aspirin: Secondary | ICD-10-CM | POA: Diagnosis not present

## 2018-09-22 DIAGNOSIS — Z792 Long term (current) use of antibiotics: Secondary | ICD-10-CM | POA: Insufficient documentation

## 2018-09-22 DIAGNOSIS — C9110 Chronic lymphocytic leukemia of B-cell type not having achieved remission: Secondary | ICD-10-CM | POA: Diagnosis present

## 2018-09-22 DIAGNOSIS — Z808 Family history of malignant neoplasm of other organs or systems: Secondary | ICD-10-CM | POA: Insufficient documentation

## 2018-09-22 DIAGNOSIS — Z801 Family history of malignant neoplasm of trachea, bronchus and lung: Secondary | ICD-10-CM | POA: Insufficient documentation

## 2018-09-22 DIAGNOSIS — Z79899 Other long term (current) drug therapy: Secondary | ICD-10-CM | POA: Diagnosis not present

## 2018-09-22 DIAGNOSIS — Z85828 Personal history of other malignant neoplasm of skin: Secondary | ICD-10-CM | POA: Diagnosis not present

## 2018-09-22 DIAGNOSIS — Z803 Family history of malignant neoplasm of breast: Secondary | ICD-10-CM | POA: Insufficient documentation

## 2018-09-22 DIAGNOSIS — E039 Hypothyroidism, unspecified: Secondary | ICD-10-CM | POA: Insufficient documentation

## 2018-09-22 DIAGNOSIS — Z9049 Acquired absence of other specified parts of digestive tract: Secondary | ICD-10-CM | POA: Diagnosis not present

## 2018-09-22 DIAGNOSIS — K219 Gastro-esophageal reflux disease without esophagitis: Secondary | ICD-10-CM | POA: Diagnosis not present

## 2018-09-22 LAB — CBC WITH DIFFERENTIAL/PLATELET
Abs Immature Granulocytes: 0.13 10*3/uL — ABNORMAL HIGH (ref 0.00–0.07)
Basophils Absolute: 0.1 10*3/uL (ref 0.0–0.1)
Basophils Relative: 0 %
Eosinophils Absolute: 0.5 10*3/uL (ref 0.0–0.5)
Eosinophils Relative: 1 %
HCT: 35.9 % — ABNORMAL LOW (ref 39.0–52.0)
Hemoglobin: 11.5 g/dL — ABNORMAL LOW (ref 13.0–17.0)
Immature Granulocytes: 0 %
Lymphocytes Relative: 89 %
Lymphs Abs: 63.2 10*3/uL — ABNORMAL HIGH (ref 0.7–4.0)
MCH: 34.8 pg — ABNORMAL HIGH (ref 26.0–34.0)
MCHC: 32 g/dL (ref 30.0–36.0)
MCV: 108.8 fL — ABNORMAL HIGH (ref 80.0–100.0)
Monocytes Absolute: 4.3 10*3/uL — ABNORMAL HIGH (ref 0.1–1.0)
Monocytes Relative: 6 %
Neutro Abs: 2.8 10*3/uL (ref 1.7–7.7)
Neutrophils Relative %: 4 %
Platelets: 138 10*3/uL — ABNORMAL LOW (ref 150–400)
RBC: 3.3 MIL/uL — ABNORMAL LOW (ref 4.22–5.81)
RDW: 13.1 % (ref 11.5–15.5)
Smear Review: NORMAL
WBC: 71.1 10*3/uL (ref 4.0–10.5)
nRBC: 0 % (ref 0.0–0.2)

## 2018-09-22 LAB — PROTIME-INR
INR: 1 (ref 0.8–1.2)
Prothrombin Time: 12.9 seconds (ref 11.4–15.2)

## 2018-09-22 MED ORDER — FENTANYL CITRATE (PF) 100 MCG/2ML IJ SOLN
INTRAMUSCULAR | Status: AC
  Filled 2018-09-22: qty 2

## 2018-09-22 MED ORDER — HEPARIN SOD (PORK) LOCK FLUSH 100 UNIT/ML IV SOLN
INTRAVENOUS | Status: AC
  Filled 2018-09-22: qty 5

## 2018-09-22 MED ORDER — MIDAZOLAM HCL 2 MG/2ML IJ SOLN
INTRAMUSCULAR | Status: AC
  Filled 2018-09-22: qty 4

## 2018-09-22 MED ORDER — MIDAZOLAM HCL 2 MG/2ML IJ SOLN
INTRAMUSCULAR | Status: AC | PRN
  Administered 2018-09-22: 0.5 mg via INTRAVENOUS

## 2018-09-22 MED ORDER — FENTANYL CITRATE (PF) 100 MCG/2ML IJ SOLN
INTRAMUSCULAR | Status: AC | PRN
  Administered 2018-09-22: 25 ug via INTRAVENOUS

## 2018-09-22 MED ORDER — SODIUM CHLORIDE 0.9 % IV SOLN
INTRAVENOUS | Status: DC
  Administered 2018-09-22: 08:00:00 via INTRAVENOUS

## 2018-09-22 NOTE — Discharge Instructions (Signed)

## 2018-09-22 NOTE — Consult Note (Signed)
Chief Complaint: Patient was seen in consultation today for bone marrow biopsy  at the request of Allen Chang,Allen Chang  Referring Physician(s): Allen Chang,Allen Chang  Patient Status: [facility] - Out-pt  History of Present Illness: Allen Chang is a 85 y.o. male with history of prostate cancer and diagnosis of CLL.  Request for bone marrow biopsy.  Patient's main complaint is chronic back pain with pain extending into the right hip and right leg.  Patient had recent back injections.  He denies fevers, chills, breathing problems, chest pain, GI or GU problems.  Patient says that he had a bone marrow biopsy approximately 30 to 40 years ago.  Past Medical History:  Diagnosis Date  . Cancer [place] [place])    prostate   . Complication of anesthesia    bradycardia, in ICU for 24 hour after galbladder surgery.   . Diverticulosis 2012  . Dysrhythmia    Heart skips a beat  . Elevated lipids   . GERD (gastroesophageal reflux disease)   . History of hiatal hernia   . Hypertension   . Hypothyroidism   . Skin cancer    face, scalp, behind ear,back and hand  . Tremors of nervous system     Past Surgical History:  Procedure Laterality Date  . BLADDER TUMOR EXCISION    . CARDIAC CATHETERIZATION    . CHOLECYSTECTOMY N/A 10/10/2014   Procedure: LAPAROSCOPIC CHOLECYSTECTOMY WITH INTRAOPERATIVE CHOLANGIOGRAM;  Surgeon: Leonie Green, MD;  Location: [facility] ORS;  Service: General;  Laterality: N/A;  . COLONOSCOPY WITH PROPOFOL N/A 02/16/2017   Procedure: COLONOSCOPY WITH PROPOFOL;  Surgeon: Manya Silvas, MD;  Location: [facility] ENDOSCOPY;  Service: Endoscopy;  Laterality: N/A;  . ESOPHAGOGASTRODUODENOSCOPY (EGD) WITH PROPOFOL N/A 02/16/2017   Procedure: ESOPHAGOGASTRODUODENOSCOPY (EGD) WITH PROPOFOL;  Surgeon: Manya Silvas, MD;  Location: [facility] ENDOSCOPY;  Service: Endoscopy;  Laterality: N/A;  . HEMORRHOID SURGERY    . HERNIA REPAIR Left    x2  . JOINT REPLACEMENT Left    Partial Knee Replacement, Dr. Roland Rack  .  PARTIAL KNEE ARTHROPLASTY Left 12/12/2014   Procedure: UNICOMPARTMENTAL KNEE;  Surgeon: Corky Mull, MD;  Location: [facility] ORS;  Service: Orthopedics;  Laterality: Left;  . prostate seeding    . SHOULDER ARTHROSCOPY Right   . SHOULDER ARTHROSCOPY WITH OPEN ROTATOR CUFF REPAIR Left 02/07/2016   Procedure: SHOULDER ARTHROSCOPY WITH OPEN ROTATOR CUFF REPAIR;  Surgeon: Corky Mull, MD;  Location: [facility] ORS;  Service: Orthopedics;  Laterality: Left;    Allergies: Oxycodone-acetaminophen, Percocet [oxycodone-acetaminophen], and Voltaren [diclofenac sodium]  Medications: Prior to Admission medications   Medication Sig Start Date End Date Taking? Authorizing Provider  aspirin 81 MG tablet Take 81 mg by mouth daily. In am   Yes [provider]  b complex vitamins capsule Take 1 capsule by mouth daily.   Yes [provider]  docusate sodium (COLACE) 100 MG capsule Take 200 mg by mouth daily.    Yes [provider]  ferrous sulfate 324 MG TBEC Take 324 mg by mouth.   Yes [provider]  Flaxseed, Linseed, (FLAX SEED OIL) 1000 MG CAPS Take 1 capsule by mouth daily.   Yes [provider]  gabapentin (NEURONTIN) 100 MG capsule Take 100 mg by mouth 2 (two) times daily. 02/15/18 02/15/19 Yes [provider]  Garlic 7076 MG CAPS Take 1 capsule by mouth every morning.   Yes [provider]  latanoprost (XALATAN) 0.005 % ophthalmic solution Place 1 drop into both eyes at bedtime.  Yes [provider]  levothyroxine (SYNTHROID, LEVOTHROID) 25 MCG tablet Take 25 mcg by mouth daily at 12 noon. 04/02/16  Yes [provider]  lisinopril (PRINIVIL,ZESTRIL) 20 MG tablet Take 10 mg by mouth daily. In am   Yes [provider]  loratadine (CLARITIN REDITABS) 10 MG dissolvable tablet Take 10 mg by mouth daily. In am   Yes [provider]  Multiple Vitamins-Minerals (MULTIVITAMIN WITH MINERALS) tablet Take 1 tablet by mouth  daily. In am   Yes [provider]  Omega-3 Fatty Acids (FISH OIL) 1000 MG CAPS Take 1 capsule by mouth daily.   Yes [provider]  omeprazole (PRILOSEC) 20 MG capsule Take 20 mg by mouth daily.    Yes [provider]  polyethylene glycol (MIRALAX / GLYCOLAX) packet Take 17 g by mouth daily.   Yes [provider]  primidone (MYSOLINE) 50 MG tablet Take 50 mg by mouth 2 (two) times daily.   Yes [provider]  traMADol (ULTRAM) 50 MG tablet Take 50 mg by mouth. 08/13/18  Yes [provider]  amoxicillin-clavulanate (AUGMENTIN) 875-125 MG tablet Take 1 tablet by mouth 2 (two) times daily.    [provider]  dicyclomine (BENTYL) 10 MG capsule Take 10 mg by mouth 4 (four) times daily -  before meals and at bedtime.    [provider]  Misc Natural Products (BLACK CHERRY CONCENTRATE) LIQD Take 15 mLs by mouth daily.     [provider]  triamcinolone lotion (KENALOG) 0.1 % Apply 1 application topically once.    [provider]     Family History  Problem Relation Age of Onset  . Bone cancer Father   . Hypertension Mother   . Osteoporosis Mother   . Breast cancer Sister   . Cancer Brother   . Cancer Brother   . Lung cancer Brother   . Bladder Cancer Brother   . Melanoma Brother     Social History   Socioeconomic History  . Marital status: Married    Spouse name: Not on file  . Number of children: Not on file  . Years of education: Not on file  . Highest education level: Not on file  Occupational History  . Not on file  Social Needs  . Financial resource strain: Not on file  . Food insecurity    Worry: Not on file    Inability: Not on file  . Transportation needs    Medical: Not on file    Non-medical: Not on file  Tobacco Use  . Smoking status: Never Smoker  . Smokeless tobacco: Never Used  Substance and Sexual Activity  . Alcohol use: No  . Drug use: No  . Sexual activity: Yes   Lifestyle  . Physical activity    Days per week: Not on file    Minutes per session: Not on file  . Stress: Not on file  Relationships  . Social Herbalist on phone: Not on file    Gets together: Not on file    Attends religious service: Not on file    Active member of club or organization: Not on file    Attends meetings of clubs or organizations: Not on file    Relationship status: Not on file  Other Topics Concern  . Not on file  Social History Narrative  . Not on file      Review of Systems  Constitutional: Negative for chills and fever.  Respiratory: Negative.   Cardiovascular: Negative.   Gastrointestinal: Negative.   Genitourinary: Negative.   Musculoskeletal: Positive for back pain.  Neurological: Positive for numbness.    Vital Signs: BP 139/68   Pulse (!) 54   Temp 98.5 F (36.9 C) (Oral)   Ht _0  (1.753 m)   Wt 77.6 kg   SpO2 100%   BMI 25.25 kg/m   Physical Exam Vitals signs reviewed.  Cardiovascular:     Rate and Rhythm: Normal rate and regular rhythm.     Heart sounds: Normal heart sounds.  Pulmonary:     Effort: Pulmonary effort is normal.     Breath sounds: Normal breath sounds.  Abdominal:     General: Abdomen is flat. Bowel sounds are normal.     Palpations: Abdomen is soft.     Imaging: No results found.  Labs:  CBC: Recent Labs    02/01/18 1237 05/26/18 1048 08/25/18 1258 09/22/18 0809  WBC 49.9* 47.8* 60.2* 71.1*  HGB 12.3* 11.8* 9.8* 11.5*  HCT 38.0* 35.9* 30.9* 35.9*  PLT 127* 117* 133* 138*    COAGS: Recent Labs    09/22/18 0809  INR 1.0    BMP: Recent Labs    05/26/18 1032 05/26/18 1048 08/25/18 1258  NA 141 142 141  K 4.1 4.1 4.8  CL 107 107 107  CO2 _1 GLUCOSE 110* 115* 152*  BUN 36* 37* 38*  CALCIUM 9.1 9.1 8.9  CREATININE 1.30* 1.47* 1.52*  GFRNONAA 50* 43* 41*  GFRAA 58* 50* 48*    LIVER FUNCTION TESTS: Recent Labs    05/26/18 1032 05/26/18 1048 08/25/18 1258   BILITOT 0.8 0.7 0.7  AST _2 ALT _3 ALKPHOS 82 77 65  PROT 6.2* 6.6 6.1*  ALBUMIN 4.0 4.1 4.0    TUMOR MARKERS: No results for input(s): AFPTM, CEA, CA199, CHROMGRNA in the last 8760 hours.  Assessment and Plan:  85 year old with leukocytosis and diagnosis of CLL.  Plan for CT-guided bone marrow biopsy with moderate sedation.  Risks and benefits of bone marrow biopsy was discussed with the patient and/or patient's family including, but not limited to bleeding, infection, damage to adjacent structures or low yield requiring additional tests.  All of the questions were answered and there is agreement to proceed.  Consent signed and in chart.   Thank you for this interesting consult.  I greatly enjoyed meeting Clance A Berber and look forward to participating in their care.  A copy of this report was sent to the requesting provider on this date.  Electronically Signed: Burman Riis, MD 09/22/2018, 8:50 AM   I spent a total of  15 Minutes   in face to face in clinical consultation, greater than 50% of which was counseling/coordinating care for CT-guided bone marrow biopsy.

## 2018-09-22 NOTE — Procedures (Signed)
Interventional Radiology Procedure:   Indications: CLL  Procedure: CT guided bone marrow biopsy  Findings: 2 aspirates and 1 core from right ilium  Complications: None     EBL: Minimal, less than 10 ml  Plan: Discharge to home in one hour.   Jaelyn Bourgoin R. Anselm Pancoast, MD  Pager: [phone]

## 2018-09-24 ENCOUNTER — Other Ambulatory Visit: Payer: Self-pay | Admitting: Physical Medicine and Rehabilitation

## 2018-09-24 DIAGNOSIS — M5416 Radiculopathy, lumbar region: Secondary | ICD-10-CM

## 2018-09-28 ENCOUNTER — Telehealth: Payer: Self-pay | Admitting: Oncology

## 2018-09-28 ENCOUNTER — Other Ambulatory Visit: Payer: Self-pay | Admitting: Oncology

## 2018-09-28 DIAGNOSIS — Z7189 Other specified counseling: Secondary | ICD-10-CM | POA: Insufficient documentation

## 2018-09-28 MED ORDER — CALQUENCE 100 MG PO CAPS: 100.0000 mg | ORAL_CAPSULE | Freq: Two times a day (BID) | ORAL | 3 refills | Status: DC

## 2018-09-28 NOTE — Progress Notes (Signed)
START ON PATHWAY REGIMEN - Lymphoma and CLL     A cycle is every 28 days:     Acalabrutinib   **Always confirm dose/schedule in your pharmacy ordering system**  Patient Characteristics: Chronic Lymphocytic Leukemia (CLL), First Line, Treatment Indicated, 17p del(-)/Unknown and ATM Mutation Negative/Unknown and TP53 Mutation Negative/Unknown, Age ? 60 or Frail (Any Age) Disease Type: Chronic Lymphocytic Leukemia (CLL) Disease Type: Not Applicable Disease Type: Not Applicable Line of Therapy: First Line RAI Stage: IV Treatment Indicated<= Treatment Indicated ATM Mutation Status: Negative 17p Deletion Status: Negative TP53 Mutation Status: Negative Patient Age: ? 60 Patient Condition: Frail Patient Intent of Therapy: Non-Curative / Palliative Intent, Discussed with Patient 

## 2018-09-28 NOTE — Telephone Encounter (Signed)
Bone marrow biopsy results were communicated with patient and his wife over the phone. Extensive involvement of CLL in the marrow.  In combination with cytopenia, recommend starting treatments. We will ask pharmacy to look into his coverage and patient was given a heads up about possible phone calls from pharmacy.  Patient and wife voices understanding and agree with the plan.

## 2018-09-29 ENCOUNTER — Telehealth: Payer: Self-pay | Admitting: Pharmacist

## 2018-09-29 ENCOUNTER — Encounter (HOSPITAL_COMMUNITY): Payer: Self-pay | Admitting: Oncology

## 2018-09-29 DIAGNOSIS — C9110 Chronic lymphocytic leukemia of B-cell type not having achieved remission: Secondary | ICD-10-CM

## 2018-09-29 MED ORDER — CALQUENCE 100 MG PO CAPS: 100.0000 mg | ORAL_CAPSULE | Freq: Two times a day (BID) | ORAL | 3 refills | Status: DC

## 2018-09-29 NOTE — Telephone Encounter (Signed)
Oral Chemotherapy Pharmacist Encounter  Successfully enrolled patient for copayment assistance funds from [facility] from the [place].  Award amount: $8,400 Effective dates: 07/01/2018 - 09/28/2019 ID: [id number]  BIN: [id number] Group: [id number] PCN: [id number]  Billing information will be shared with [place]. I will place a copy of the award letter to be scanned into patient's chart.  Darl Pikes, PharmD, BCPS Hematology/Oncology Clinical Pharmacist [facility]/HP/AP [facility] [phone]  09/29/2018 3:25 PM

## 2018-09-29 NOTE — Telephone Encounter (Signed)
Oral Oncology Pharmacist Encounter  Received new prescription for Calquence (acalabrutinib) for the treatment of CLL, planned duration until disease progression or unacceptable drug toxicity.  CBC from 09/22/2018 assessed, no relevant lab abnormalities. Prescription dose and frequency assessed.   Current medication list in Epic reviewed, a few relevant DDIs with acalabrutinib identified: -Primidone: Category D interaction, primidone may decrease the concentration of Calquence. If the combination can't be avoided the manufacturer recommends increasing the dose to 200mg  twice daily. Mr. Balsam is on a very low dose of primidone. I would suggest starting him on normal dosing 100mg  twice daily, see how his disease responds, then increase if it seems like he is not getting enough response from the acalabrutinib.  -Omeprazole: Category X, PPIs like omeprazole may decrease the serum concentration of acalabrutinib. Avoid concurrent use. If acid suppression is needed, antacids can be used if separated from acalabrutinib by at least 2 hours. H2 receptor antagonists are another option, acalabrutinib should be given 2 hours before taking H2 receptor antagonists.   Prescription has been e-scribed to the [facility] for benefits analysis and approval.  Oral Oncology Clinic will continue to follow for insurance authorization, copayment issues, initial counseling and start date.  Darl Pikes, PharmD, BCPS, [facility] Hematology/Oncology Clinical Pharmacist [facility] [place] [facility] [phone]  09/29/2018 12:57 PM

## 2018-09-29 NOTE — Telephone Encounter (Signed)
Oral Chemotherapy Pharmacist Encounter  Successfully enrolled patient for copayment assistance funds from [place] from the [employer].  Award amount: $8,000 Effective dates: 08/30/2018 - 08/29/2019 ID: [id number] BIN: [id number] Group: [id number] PCN: [id number]  Billing information will be shared with [facility]. I will place a copy of the award letter to be scanned into patient's chart.  Darl Pikes, PharmD, BCPS Hematology/Oncology Clinical Pharmacist [facility] [place] [facility] [phone]  09/29/2018 3:51 PM

## 2018-09-29 NOTE — Telephone Encounter (Signed)
Oral Oncology Pharmacist Encounter   Received notification from [employer] that prior authorization for Calquence is required.   PA submitted on CMM Key [id number] Status is pending   Oral Oncology Clinic will continue to follow.   Darl Pikes, PharmD, BCPS, [facility] Hematology/Oncology Clinical Pharmacist [facility] [place] [facility] [phone]  09/29/2018 2:43 PM

## 2018-09-29 NOTE — Telephone Encounter (Signed)
Oral Oncology Pharmacist Encounter   Prior Authorization for Calquence has been approved.     PA# [id number] Effective dates: 09/29/2018 through 02/17/2019 Copay will be $2773.42, will contact patient for possible enrollment in copay assistance   [place] [facility] will continue to follow.   Darl Pikes, PharmD, BCPS. BCOP Hematology/Oncology Clinical Pharmacist [facility] [phone]  09/29/2018 2:44 PM

## 2018-09-30 ENCOUNTER — Telehealth: Payer: Self-pay | Admitting: Pharmacist

## 2018-09-30 MED FILL — CALQUENCE 100 MG CAPSULE: 100 | 30 days supply | Qty: 60 | Fill #0

## 2018-09-30 NOTE — Telephone Encounter (Signed)
Oral Chemotherapy Pharmacist Encounter  I spoke with patient, his wife and daughter for overview of: Calquence (acalabrutinib) for the treatment of CLL, planned duration until disease progression or unacceptable toxicity.   Counseled patient on administration, dosing, side effects, monitoring, drug-food interactions, safe handling, storage, and disposal.  Patient will take Calquence 100mg  capsules, 1 capsule by mouth approximately 12 hours apart, with or with out food, with a glass of water.  Patient knows to avoid acid suppressing agents, if needed, may seperate use of an H2RA blocker or antacid by 2 hours from Calquence admisinstration. Patient and patient's family expressed understanding that patient will need to avoid omeprazole, which is on patient's medication list, and if needed use famotidine for acid suppression.   Calquence start date: 10/01/18  Adverse effects include but are not limited to: headache, diarrhea, fatigue, rash, muscle pain, bruising, decreased blood counts, and altered cardiac conduction.    Reviewed with patient importance of keeping a medication schedule and plan for any missed doses.  Medication reconciliation performed and medication/allergy list updated.  This will ship from the [facility] on 09/30/18 to deliver to patient's home on 10/01/18.  Patient informed the pharmacy will reach out 5-7 days prior to needing next fill of Calquence to coordinate continued medication acquisition to prevent break in therapy.  All questions answered.  [name], Allen Chang, and their dauaghter voiced understanding and appreciation.   Patient knows to call the office with questions or concerns.  Thank you for allowing pharmacy to be a part of this patient's care.  Leron Croak, PharmD PGY2 Oncology/Hematology Pharmacy Resident 09/30/2018 2:27 PM Oral Oncology Clinic [phone]

## 2018-10-01 ENCOUNTER — Other Ambulatory Visit: Payer: Self-pay | Admitting: *Deleted

## 2018-10-01 DIAGNOSIS — C9110 Chronic lymphocytic leukemia of B-cell type not having achieved remission: Secondary | ICD-10-CM

## 2018-10-06 ENCOUNTER — Ambulatory Visit
Admission: RE | Admit: 2018-10-06 | Discharge: 2018-10-06 | Disposition: A | Discharge status: Home / Self Care | Payer: Medicare Other | Source: Ambulatory Visit | Attending: Physical Medicine and Rehabilitation | Admitting: Physical Medicine and Rehabilitation

## 2018-10-06 ENCOUNTER — Other Ambulatory Visit: Payer: Self-pay

## 2018-10-06 DIAGNOSIS — M5416 Radiculopathy, lumbar region: Secondary | ICD-10-CM | POA: Diagnosis present

## 2018-10-07 ENCOUNTER — Inpatient Hospital Stay (HOSPITAL_BASED_OUTPATIENT_CLINIC_OR_DEPARTMENT_OTHER): Payer: Medicare Other | Admitting: Oncology

## 2018-10-07 ENCOUNTER — Encounter: Payer: Self-pay | Admitting: Oncology

## 2018-10-07 ENCOUNTER — Other Ambulatory Visit: Payer: Self-pay

## 2018-10-07 ENCOUNTER — Inpatient Hospital Stay: Payer: Medicare Other | Attending: Oncology

## 2018-10-07 VITALS — BP 142/67 | HR 60 | Ht 68.5 in | Wt 171.6 lb

## 2018-10-07 DIAGNOSIS — R12 Heartburn: Secondary | ICD-10-CM

## 2018-10-07 DIAGNOSIS — Z79899 Other long term (current) drug therapy: Secondary | ICD-10-CM | POA: Insufficient documentation

## 2018-10-07 DIAGNOSIS — C9110 Chronic lymphocytic leukemia of B-cell type not having achieved remission: Secondary | ICD-10-CM

## 2018-10-07 DIAGNOSIS — D509 Iron deficiency anemia, unspecified: Secondary | ICD-10-CM | POA: Diagnosis not present

## 2018-10-07 DIAGNOSIS — Z5111 Encounter for antineoplastic chemotherapy: Secondary | ICD-10-CM

## 2018-10-07 DIAGNOSIS — N183 Chronic kidney disease, stage 3 unspecified: Secondary | ICD-10-CM

## 2018-10-07 DIAGNOSIS — Z8546 Personal history of malignant neoplasm of prostate: Secondary | ICD-10-CM | POA: Insufficient documentation

## 2018-10-07 DIAGNOSIS — I10 Essential (primary) hypertension: Secondary | ICD-10-CM | POA: Diagnosis not present

## 2018-10-07 DIAGNOSIS — Z803 Family history of malignant neoplasm of breast: Secondary | ICD-10-CM | POA: Diagnosis not present

## 2018-10-07 DIAGNOSIS — Z7982 Long term (current) use of aspirin: Secondary | ICD-10-CM | POA: Insufficient documentation

## 2018-10-07 DIAGNOSIS — Z7189 Other specified counseling: Secondary | ICD-10-CM

## 2018-10-07 DIAGNOSIS — Z85828 Personal history of other malignant neoplasm of skin: Secondary | ICD-10-CM | POA: Insufficient documentation

## 2018-10-07 DIAGNOSIS — D696 Thrombocytopenia, unspecified: Secondary | ICD-10-CM

## 2018-10-07 DIAGNOSIS — E039 Hypothyroidism, unspecified: Secondary | ICD-10-CM | POA: Diagnosis not present

## 2018-10-07 LAB — COMPREHENSIVE METABOLIC PANEL
ALT: 16 U/L (ref 0–44)
AST: 21 U/L (ref 15–41)
Albumin: 4.4 g/dL (ref 3.5–5.0)
Alkaline Phosphatase: 69 U/L (ref 38–126)
Anion gap: 8 (ref 5–15)
BUN: 30 mg/dL — ABNORMAL HIGH (ref 8–23)
CO2: 24 mmol/L (ref 22–32)
Calcium: 9.2 mg/dL (ref 8.9–10.3)
Chloride: 108 mmol/L (ref 98–111)
Creatinine, Ser: 1.43 mg/dL — ABNORMAL HIGH (ref 0.61–1.24)
GFR calc Af Amer: 52 mL/min — ABNORMAL LOW (ref >60)
GFR calc non Af Amer: 45 mL/min — ABNORMAL LOW (ref >60)
Glucose, Bld: 105 mg/dL — ABNORMAL HIGH (ref 70–99)
Potassium: 4.5 mmol/L (ref 3.5–5.1)
Sodium: 140 mmol/L (ref 135–145)
Total Bilirubin: 1 mg/dL (ref 0.3–1.2)
Total Protein: 7 g/dL (ref 6.5–8.1)

## 2018-10-07 LAB — CBC WITH DIFFERENTIAL/PLATELET
Abs Immature Granulocytes: 0.15 10*3/uL — ABNORMAL HIGH (ref 0.00–0.07)
Basophils Absolute: 0.2 10*3/uL — ABNORMAL HIGH (ref 0.0–0.1)
Basophils Relative: 0 %
Eosinophils Absolute: 0.6 10*3/uL — ABNORMAL HIGH (ref 0.0–0.5)
Eosinophils Relative: 0 %
HCT: 34.7 % — ABNORMAL LOW (ref 39.0–52.0)
Hemoglobin: 10.8 g/dL — ABNORMAL LOW (ref 13.0–17.0)
Immature Granulocytes: 0 %
Lymphocytes Relative: 97 %
Lymphs Abs: 166.7 10*3/uL — ABNORMAL HIGH (ref 0.7–4.0)
MCH: 34.2 pg — ABNORMAL HIGH (ref 26.0–34.0)
MCHC: 31.1 g/dL (ref 30.0–36.0)
MCV: 109.8 fL — ABNORMAL HIGH (ref 80.0–100.0)
Monocytes Absolute: 2.6 10*3/uL — ABNORMAL HIGH (ref 0.1–1.0)
Monocytes Relative: 2 %
Neutro Abs: 2.1 10*3/uL (ref 1.7–7.7)
Neutrophils Relative %: 1 %
Platelets: 110 10*3/uL — ABNORMAL LOW (ref 150–400)
RBC: 3.16 MIL/uL — ABNORMAL LOW (ref 4.22–5.81)
RDW: 12.9 % (ref 11.5–15.5)
Smear Review: NORMAL
WBC Morphology: ABNORMAL
WBC: 172.4 10*3/uL (ref 4.0–10.5)
nRBC: 0 % (ref 0.0–0.2)

## 2018-10-07 NOTE — Progress Notes (Addendum)
Hematology/Oncology Follow Up Note [facility] Telephone:([phone] [phone]  CONSULT NOTE Patient Care Team: Tracie Harrier, MD as PCP - General (Internal Medicine)  REASON FOR VISIT 4 months follow up for CLL and iron deficiency anemia  HISTORY OF PRESENTING ILLNESS:  Allen Chang 85 y.o.  male with PMH listed as below who was referred by primary care provider to me for evaluation of leukocytosis. He has a history of prostate cancer which was diagnosed 10 years ago. He underwent brachytherapy. He is not any castration treatment. Per patient, he follows up with Dr.Wolf and most recent PSA is normal.   Patient also reports feeling fatigue lately. Recent labs show leukocytosis, mild anemia, macrocytosis, mild thrombocytopenia, low ferritin. Denies blood in the stool. He was started on over the counter iron supplement but wife who manages patient's medication decides to only let patient to take iron medication every other day.   # received IV iron Venofer x 4. Colonoscopy was done on 02/16/2017 which showed polyps, internal hemorrhoids, negative for malignancy.   INTERVAL HISTORY Allen Chang is a 85 y.o. male who has above history reviewed by me presents for 4 months follow up for CLL and iron deficiency anemia, macrocytic anemia.   #Patient had bone marrow biopsy on 09/22/2018 Pathology showed hypercellular bone marrow with extensive involvement by chronic lymphocytic leukemia. Patient was contacted and discussed about starting treatments. Rationale and side effects were discussed and the patient agrees with starting acalabrutinib 100 mg twice daily. Patient reports feeling well at baseline.  Continue to have chronic back/hip pain. Reports some heartburning  He is not taking omeprazole due to drug interaction.   Denies any headache, nausea, vomiting, fever, chills, cough, chest pain or abdominal pain.  Denies any diarrhea.   Review of Systems   Constitutional: Negative for appetite change, chills, fatigue, fever and unexpected weight change.  HENT:   Negative for hearing loss and voice change.   Eyes: Negative for eye problems and icterus.  Respiratory: Negative for chest tightness, cough and shortness of breath.   Cardiovascular: Negative for chest pain and leg swelling.  Gastrointestinal: Negative for abdominal distention and abdominal pain.  Endocrine: Negative for hot flashes.  Genitourinary: Negative for difficulty urinating, dysuria and frequency.   Musculoskeletal: Positive for back pain. Negative for arthralgias.  Skin: Negative for itching and rash.  Neurological: Negative for light-headedness and numbness.  Hematological: Negative for adenopathy. Does not bruise/bleed easily.  Psychiatric/Behavioral: Negative for confusion.    MEDICAL HISTORY:  Past Medical History:  Diagnosis Date  . Cancer [facility])    prostate   . Complication of anesthesia    bradycardia, in ICU for 24 hour after galbladder surgery.   . Diverticulosis 2012  . Dysrhythmia    Heart skips a beat  . Elevated lipids   . GERD (gastroesophageal reflux disease)   . History of hiatal hernia   . Hypertension   . Hypothyroidism   . Skin cancer    face, scalp, behind ear,back and hand  . Tremors of nervous system     SURGICAL HISTORY: Past Surgical History:  Procedure Laterality Date  . BLADDER TUMOR EXCISION    . CARDIAC CATHETERIZATION    . CHOLECYSTECTOMY N/A 10/10/2014   Procedure: LAPAROSCOPIC CHOLECYSTECTOMY WITH INTRAOPERATIVE CHOLANGIOGRAM;  Surgeon: Leonie Green, MD;  Location: [facility] ORS;  Service: General;  Laterality: N/A;  . COLONOSCOPY WITH PROPOFOL N/A 02/16/2017   Procedure: COLONOSCOPY WITH PROPOFOL;  Surgeon: Manya Silvas, MD;  Location: [facility] ENDOSCOPY;  Service: Endoscopy;  Laterality: N/A;  . ESOPHAGOGASTRODUODENOSCOPY (EGD) WITH PROPOFOL N/A 02/16/2017   Procedure: ESOPHAGOGASTRODUODENOSCOPY (EGD) WITH PROPOFOL;   Surgeon: Manya Silvas, MD;  Location: [facility] ENDOSCOPY;  Service: Endoscopy;  Laterality: N/A;  . HEMORRHOID SURGERY    . HERNIA REPAIR Left    x2  . JOINT REPLACEMENT Left    Partial Knee Replacement, Dr. Roland Rack  . PARTIAL KNEE ARTHROPLASTY Left 12/12/2014   Procedure: UNICOMPARTMENTAL KNEE;  Surgeon: Corky Mull, MD;  Location: [facility] ORS;  Service: Orthopedics;  Laterality: Left;  . prostate seeding    . SHOULDER ARTHROSCOPY Right   . SHOULDER ARTHROSCOPY WITH OPEN ROTATOR CUFF REPAIR Left 02/07/2016   Procedure: SHOULDER ARTHROSCOPY WITH OPEN ROTATOR CUFF REPAIR;  Surgeon: Corky Mull, MD;  Location: [facility] ORS;  Service: Orthopedics;  Laterality: Left;    SOCIAL HISTORY: Social History   Socioeconomic History  . Marital status: Married    Spouse name: Not on file  . Number of children: Not on file  . Years of education: Not on file  . Highest education level: Not on file  Occupational History  . Not on file  Social Needs  . Financial resource strain: Not on file  . Food insecurity    Worry: Not on file    Inability: Not on file  . Transportation needs    Medical: Not on file    Non-medical: Not on file  Tobacco Use  . Smoking status: Never Smoker  . Smokeless tobacco: Never Used  Substance and Sexual Activity  . Alcohol use: No  . Drug use: No  . Sexual activity: Yes  Lifestyle  . Physical activity    Days per week: Not on file    Minutes per session: Not on file  . Stress: Not on file  Relationships  . Social Herbalist on phone: Not on file    Gets together: Not on file    Attends religious service: Not on file    Active member of club or organization: Not on file    Attends meetings of clubs or organizations: Not on file    Relationship status: Not on file  . Intimate partner violence    Fear of current or ex partner: Not on file    Emotionally abused: Not on file    Physically abused: Not on file    Forced sexual activity: Not on file   Other Topics Concern  . Not on file  Social History Narrative  . Not on file    FAMILY HISTORY: Family History  Problem Relation Age of Onset  . Bone cancer Father   . Hypertension Mother   . Osteoporosis Mother   . Breast cancer Sister   . Cancer Brother   . Cancer Brother   . Lung cancer Brother   . Bladder Cancer Brother   . Melanoma Brother     ALLERGIES:  is allergic to oxycodone-acetaminophen; percocet [oxycodone-acetaminophen]; and voltaren [diclofenac sodium].  MEDICATIONS:  Current Outpatient Medications  Medication Sig Dispense Refill  . Acalabrutinib (CALQUENCE) 100 MG CAPS Take 100 mg by mouth every 12 (twelve) hours. 60 capsule 3  . amoxicillin-clavulanate (AUGMENTIN) 875-125 MG tablet Take 1 tablet by mouth 2 (two) times daily.    Marland Kitchen aspirin 81 MG tablet Take 81 mg by mouth daily. In am    . b complex vitamins capsule Take 1 capsule by mouth daily.    Marland Kitchen dicyclomine (BENTYL) 10 MG  capsule Take 10 mg by mouth 4 (four) times daily -  before meals and at bedtime.    . docusate sodium (COLACE) 100 MG capsule Take 200 mg by mouth daily.     . ferrous sulfate 324 MG TBEC Take 324 mg by mouth.    . Flaxseed, Linseed, (FLAX SEED OIL) 1000 MG CAPS Take 1 capsule by mouth daily.    Marland Kitchen gabapentin (NEURONTIN) 100 MG capsule Take 100 mg by mouth 2 (two) times daily.    . Garlic 7416 MG CAPS Take 1 capsule by mouth every morning.    . latanoprost (XALATAN) 0.005 % ophthalmic solution Place 1 drop into both eyes at bedtime.    Marland Kitchen levothyroxine (SYNTHROID, LEVOTHROID) 25 MCG tablet Take 25 mcg by mouth daily at 12 noon.    Marland Kitchen lisinopril (PRINIVIL,ZESTRIL) 20 MG tablet Take 10 mg by mouth daily. In am    . loratadine (CLARITIN REDITABS) 10 MG dissolvable tablet Take 10 mg by mouth daily. In am    . Misc Natural Products (BLACK CHERRY CONCENTRATE) LIQD Take 15 mLs by mouth daily.     . Multiple Vitamins-Minerals (MULTIVITAMIN WITH MINERALS) tablet Take 1 tablet by mouth daily. In am     . Omega-3 Fatty Acids (FISH OIL) 1000 MG CAPS Take 1 capsule by mouth daily.    Marland Kitchen omeprazole (PRILOSEC) 20 MG capsule Take 20 mg by mouth daily.     . polyethylene glycol (MIRALAX / GLYCOLAX) packet Take 17 g by mouth daily.    . primidone (MYSOLINE) 50 MG tablet Take 50 mg by mouth 2 (two) times daily.    . traMADol (ULTRAM) 50 MG tablet Take 50 mg by mouth.    . triamcinolone lotion (KENALOG) 0.1 % Apply 1 application topically once.     No current facility-administered medications for this visit.       Marland Kitchen  PHYSICAL EXAMINATION: ECOG PERFORMANCE STATUS: 0 - Asymptomatic There were no vitals filed for this visit. There were no vitals filed for this visit. Physical Exam  Constitutional: He is oriented to person, place, and time. No distress.  Walk with a cane  HENT:  Head: Normocephalic and atraumatic.  Nose: Nose normal.  Mouth/Throat: Oropharynx is clear and moist. No oropharyngeal exudate.  Eyes: Pupils are equal, round, and reactive to light. EOM are normal. No scleral icterus.  Neck: Normal range of motion. Neck supple.  Cardiovascular: Normal rate and regular rhythm.  No murmur heard. Pulmonary/Chest: Effort normal. No respiratory distress. He has no rales. He exhibits no tenderness.  Abdominal: Soft. He exhibits no distension. There is no abdominal tenderness.  Musculoskeletal: Normal range of motion.        General: No edema.  Neurological: He is alert and oriented to person, place, and time. No cranial nerve deficit. He exhibits normal muscle tone. Coordination normal.  Skin: Skin is warm and dry. He is not diaphoretic. No erythema.  Psychiatric: Affect normal.  .    LABORATORY DATA:  I have reviewed the data as listed Lab Results  Component Value Date   WBC 71.1 (HH) 09/22/2018   HGB 11.5 (L) 09/22/2018   HCT 35.9 (L) 09/22/2018   MCV 108.8 (H) 09/22/2018   PLT 138 (L) 09/22/2018   Recent Labs    05/26/18 1032 05/26/18 1048 08/25/18 1258  NA 141 142  141  K 4.1 4.1 4.8  CL 107 107 107  CO2 '28 27 24  ' GLUCOSE 110* 115* 152*  BUN 36* 37* 38*  CREATININE 1.30* 1.47* 1.52*  CALCIUM 9.1 9.1 8.9  GFRNONAA 50* 43* 41*  GFRAA 58* 50* 48*  PROT 6.2* 6.6 6.1*  ALBUMIN 4.0 4.1 4.0  AST '21 21 20  ' ALT '19 19 15  ' ALKPHOS 82 77 65  BILITOT 0.8 0.7 0.7    RADIOGRAPHIC STUDIES: I have personally reviewed the radiological images as listed and agreed with the findings in the report. no recent images. 11/04/2017  US abdomen Stable right renal cyst. Status post cholecystectomy.Previously seen lesion within the liver is not well appreciated on this exam. No Splenomegaly.   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 Results for CCND1/IGH, ATM, 13q and TP53 were normal.    CLL patients with trisomy 12 as the sole anomaly have an intermediate prognosis. However, recent studies have  shown that trisomy 12 in conjunction with a NOTCH1 mutation, present in about 20% of patients, represents a distinct  subgroup with poor overall survival (see references below). NOTCH1 mutation analysis is not presently available at  [place]:  1. CLL (chronic lymphocytic leukemia) ([place])   2. Goals of care, counseling/discussion   3. CKD (chronic kidney disease) stage 3, GFR 30-59 ml/min (HCC)   4. Thrombocytopenia ([place])   5. Encounter for antineoplastic chemotherapy   6. Heart burn    # CLL, intermediate risk with trisomy 12. IGVH unmutated Stage III, with anemia Hb 9.8 and extensive bone marrow involvement.   I discussed with patient again the rationale of starting acalabrutinib 100 mg twice daily, potential side effects including but not limited to lymphocytosis,  headache, low blood count, increased infection risk, nausea, vomiting, skin rash, muscle pain, irregular heartbeats etc. discussed with patient in details. Labs reviewed and discussed with him today. Clinically he tolerates treatments well. Increased lymphocytosis is anticipated due to starting treatments. Continue to monitor.  Chronic anemia thrombocytopenia counts are stable.  Continue to monitor. #Heartburn, both famotidine and omeprazole have potential interaction with his CLL treatments.  Since his symptom is quite mild, will recommend patient to start taking over-the-counter Tums.  # CKD, Stage 3. Continue to monitor. Avoid NSAIDs SPEP showed possible monoclonal protein 0.3.  Will check immunofixation.  Can be secondary to lymphoma.  Return of visit: 3 months.  Orders Placed This Encounter  Procedures  . CBC with Differential/Platelet    Standing Status:   Future    Standing Expiration Date:   10/07/2019  . Comprehensive metabolic panel    Standing Status:   Future    Standing Expiration Date:   10/07/2019  . Multiple Myeloma Panel (SPEP&IFE w/QIG)    Standing Status:   Future    Standing Expiration Date:   10/07/2019  . Kappa/lambda light chains    Standing Status:   Future    Standing Expiration Date:   10/07/2019   We spent sufficient time to discuss many aspect of care, questions were answered to patient's satisfaction. Total face to face encounter time for this patient visit was 25 min. >50% of the time was  spent in counseling and coordination of care.   [name], MD, PhD

## 2018-10-07 NOTE — Progress Notes (Signed)
Pt is having right leg pain. Radiates from the hip to his foot. Rates a 6-7.  Foot burns and stings.  Using a cane.  Had MRI yesterday of his back per patient.

## 2018-10-11 ENCOUNTER — Encounter: Payer: Self-pay | Admitting: Oncology

## 2018-10-13 ENCOUNTER — Encounter: Payer: Self-pay | Admitting: Oncology

## 2018-10-20 NOTE — Progress Notes (Signed)
Called to prechart on patient unable to leave message

## 2018-10-21 ENCOUNTER — Encounter: Payer: Self-pay | Admitting: Oncology

## 2018-10-21 ENCOUNTER — Inpatient Hospital Stay (HOSPITAL_BASED_OUTPATIENT_CLINIC_OR_DEPARTMENT_OTHER): Payer: Medicare Other | Admitting: Oncology

## 2018-10-21 ENCOUNTER — Other Ambulatory Visit: Payer: Self-pay

## 2018-10-21 ENCOUNTER — Inpatient Hospital Stay: Payer: Medicare Other | Attending: Oncology

## 2018-10-21 VITALS — BP 101/63 | HR 65 | Temp 98.3°F | Resp 18 | Wt 168.6 lb

## 2018-10-21 DIAGNOSIS — N183 Chronic kidney disease, stage 3 unspecified: Secondary | ICD-10-CM

## 2018-10-21 DIAGNOSIS — I10 Essential (primary) hypertension: Secondary | ICD-10-CM | POA: Diagnosis not present

## 2018-10-21 DIAGNOSIS — D696 Thrombocytopenia, unspecified: Secondary | ICD-10-CM | POA: Diagnosis not present

## 2018-10-21 DIAGNOSIS — Z79899 Other long term (current) drug therapy: Secondary | ICD-10-CM | POA: Insufficient documentation

## 2018-10-21 DIAGNOSIS — C9110 Chronic lymphocytic leukemia of B-cell type not having achieved remission: Secondary | ICD-10-CM

## 2018-10-21 DIAGNOSIS — Z85828 Personal history of other malignant neoplasm of skin: Secondary | ICD-10-CM | POA: Insufficient documentation

## 2018-10-21 DIAGNOSIS — Z7982 Long term (current) use of aspirin: Secondary | ICD-10-CM | POA: Insufficient documentation

## 2018-10-21 DIAGNOSIS — E039 Hypothyroidism, unspecified: Secondary | ICD-10-CM | POA: Insufficient documentation

## 2018-10-21 DIAGNOSIS — D649 Anemia, unspecified: Secondary | ICD-10-CM | POA: Diagnosis not present

## 2018-10-21 DIAGNOSIS — Z8546 Personal history of malignant neoplasm of prostate: Secondary | ICD-10-CM | POA: Insufficient documentation

## 2018-10-21 DIAGNOSIS — Z5111 Encounter for antineoplastic chemotherapy: Secondary | ICD-10-CM

## 2018-10-21 DIAGNOSIS — Z7189 Other specified counseling: Secondary | ICD-10-CM

## 2018-10-21 LAB — CBC WITH DIFFERENTIAL/PLATELET
Abs Immature Granulocytes: 0.15 10*3/uL — ABNORMAL HIGH (ref 0.00–0.07)
Basophils Absolute: 0.1 10*3/uL (ref 0.0–0.1)
Basophils Relative: 0 %
Eosinophils Absolute: 0.3 10*3/uL (ref 0.0–0.5)
Eosinophils Relative: 0 %
HCT: 32.4 % — ABNORMAL LOW (ref 39.0–52.0)
Hemoglobin: 9.9 g/dL — ABNORMAL LOW (ref 13.0–17.0)
Immature Granulocytes: 0 %
Lymphocytes Relative: 96 %
Lymphs Abs: 107.7 10*3/uL — ABNORMAL HIGH (ref 0.7–4.0)
MCH: 33.6 pg (ref 26.0–34.0)
MCHC: 30.6 g/dL (ref 30.0–36.0)
MCV: 109.8 fL — ABNORMAL HIGH (ref 80.0–100.0)
Monocytes Absolute: 1 10*3/uL (ref 0.1–1.0)
Monocytes Relative: 1 %
Neutro Abs: 3.5 10*3/uL (ref 1.7–7.7)
Neutrophils Relative %: 3 %
Platelets: 118 10*3/uL — ABNORMAL LOW (ref 150–400)
RBC: 2.95 MIL/uL — ABNORMAL LOW (ref 4.22–5.81)
RDW: 12.5 % (ref 11.5–15.5)
Smear Review: NORMAL
WBC: 112.7 10*3/uL (ref 4.0–10.5)
nRBC: 0 % (ref 0.0–0.2)

## 2018-10-21 LAB — COMPREHENSIVE METABOLIC PANEL
ALT: 15 U/L (ref 0–44)
AST: 16 U/L (ref 15–41)
Albumin: 4 g/dL (ref 3.5–5.0)
Alkaline Phosphatase: 66 U/L (ref 38–126)
Anion gap: 8 (ref 5–15)
BUN: 37 mg/dL — ABNORMAL HIGH (ref 8–23)
CO2: 24 mmol/L (ref 22–32)
Calcium: 9.7 mg/dL (ref 8.9–10.3)
Chloride: 108 mmol/L (ref 98–111)
Creatinine, Ser: 1.38 mg/dL — ABNORMAL HIGH (ref 0.61–1.24)
GFR calc Af Amer: 54 mL/min — ABNORMAL LOW (ref >60)
GFR calc non Af Amer: 47 mL/min — ABNORMAL LOW (ref >60)
Glucose, Bld: 108 mg/dL — ABNORMAL HIGH (ref 70–99)
Potassium: 4.8 mmol/L (ref 3.5–5.1)
Sodium: 140 mmol/L (ref 135–145)
Total Bilirubin: 0.7 mg/dL (ref 0.3–1.2)
Total Protein: 6.6 g/dL (ref 6.5–8.1)

## 2018-10-21 NOTE — Progress Notes (Signed)
Pt in for follow up, denies any concerns.  Reports has a headache.

## 2018-10-22 DIAGNOSIS — Z5111 Encounter for antineoplastic chemotherapy: Secondary | ICD-10-CM | POA: Insufficient documentation

## 2018-10-22 LAB — KAPPA/LAMBDA LIGHT CHAINS
Kappa free light chain: 42.1 mg/L — ABNORMAL HIGH (ref 3.3–19.4)
Kappa, lambda light chain ratio: 1.38 (ref 0.26–1.65)
Lambda free light chains: 30.6 mg/L — ABNORMAL HIGH (ref 5.7–26.3)

## 2018-10-22 LAB — MULTIPLE MYELOMA PANEL, SERUM
Albumin SerPl Elph-Mcnc: 3.5 g/dL (ref 2.9–4.4)
Albumin/Glob SerPl: 1.6 (ref 0.7–1.7)
Alpha 1: 0.2 g/dL (ref 0.0–0.4)
Alpha2 Glob SerPl Elph-Mcnc: 0.7 g/dL (ref 0.4–1.0)
B-Globulin SerPl Elph-Mcnc: 0.7 g/dL (ref 0.7–1.3)
Gamma Glob SerPl Elph-Mcnc: 0.7 g/dL (ref 0.4–1.8)
Globulin, Total: 2.3 g/dL (ref 2.2–3.9)
IgA: 31 mg/dL — ABNORMAL LOW (ref 61–437)
IgG (Immunoglobin G), Serum: 819 mg/dL (ref 603–1613)
IgM (Immunoglobulin M), Srm: 34 mg/dL (ref 15–143)
M Protein SerPl Elph-Mcnc: 0.3 g/dL — ABNORMAL HIGH
Total Protein ELP: 5.8 g/dL — ABNORMAL LOW (ref 6.0–8.5)

## 2018-10-22 NOTE — Progress Notes (Signed)
Hematology/Oncology Follow Up Note [facility] Telephone:([phone] [phone]  CONSULT NOTE Patient Care Team: Tracie Harrier, MD as PCP - General (Internal Medicine)  REASON FOR VISIT 4 months follow up for CLL and iron deficiency anemia  HISTORY OF PRESENTING ILLNESS:  Allen Chang 85 y.o.  male with PMH listed as below who was referred by primary care provider to me for evaluation of leukocytosis. He has a history of prostate cancer which was diagnosed 10 years ago. He underwent brachytherapy. He is not any castration treatment. Per patient, he follows up with Dr.Wolf and most recent PSA is normal.   Patient also reports feeling fatigue lately. Recent labs show leukocytosis, mild anemia, macrocytosis, mild thrombocytopenia, low ferritin. Denies blood in the stool. He was started on over the counter iron supplement but wife who manages patient's medication decides to only let patient to take iron medication every other day.   # received IV iron Venofer x 4. Colonoscopy was done on 02/16/2017 which showed polyps, internal hemorrhoids, negative for malignancy.  ##Patient had bone marrow biopsy on 09/22/2018 Pathology showed hypercellular bone marrow with extensive involvement by chronic lymphocytic leukemia.  INTERVAL HISTORY Allen Chang is a 85 y.o. male who has above history reviewed by me presents for 4 months follow up for CLL and iron deficiency anemia, macrocytic anemia.  Started on acalabrutinib 100 mg twice daily on 10/01/2018.  Reports tolerating treatment.  Fatigue level is at baseline. He has chronic back pain /hip pain which improved after steroid injection. Denies any headache, nausea, vomiting, fever, chills, cough, chest pain or abdominal pain.  Denies any diarrhea.  Review of Systems  Constitutional: Positive for fatigue. Negative for appetite change, chills, fever and unexpected weight change.  HENT:   Negative for hearing loss  and voice change.   Eyes: Negative for eye problems and icterus.  Respiratory: Negative for chest tightness, cough and shortness of breath.   Cardiovascular: Negative for chest pain and leg swelling.  Gastrointestinal: Negative for abdominal distention and abdominal pain.  Endocrine: Negative for hot flashes.  Genitourinary: Negative for difficulty urinating, dysuria and frequency.   Musculoskeletal: Positive for back pain. Negative for arthralgias.  Skin: Negative for itching and rash.  Neurological: Negative for light-headedness and numbness.  Hematological: Negative for adenopathy. Does not bruise/bleed easily.  Psychiatric/Behavioral: Negative for confusion.    MEDICAL HISTORY:  Past Medical History:  Diagnosis Date  . Cancer [facility])    prostate   . Complication of anesthesia    bradycardia, in ICU for 24 hour after galbladder surgery.   . Diverticulosis 2012  . Dysrhythmia    Heart skips a beat  . Elevated lipids   . GERD (gastroesophageal reflux disease)   . History of hiatal hernia   . Hypertension   . Hypothyroidism   . Leukemia ([place])   . Prostate cancer ([place])   . Skin cancer    face, scalp, behind ear,back and hand  . Tremors of nervous system     SURGICAL HISTORY: Past Surgical History:  Procedure Laterality Date  . BLADDER TUMOR EXCISION    . CARDIAC CATHETERIZATION    . CHOLECYSTECTOMY N/A 10/10/2014   Procedure: LAPAROSCOPIC CHOLECYSTECTOMY WITH INTRAOPERATIVE CHOLANGIOGRAM;  Surgeon: Leonie Green, MD;  Location: [facility] ORS;  Service: General;  Laterality: N/A;  . COLONOSCOPY WITH PROPOFOL N/A 02/16/2017   Procedure: COLONOSCOPY WITH PROPOFOL;  Surgeon: Manya Silvas, MD;  Location: [facility] ENDOSCOPY;  Service: Endoscopy;  Laterality: N/A;  .  ESOPHAGOGASTRODUODENOSCOPY (EGD) WITH PROPOFOL N/A 02/16/2017   Procedure: ESOPHAGOGASTRODUODENOSCOPY (EGD) WITH PROPOFOL;  Surgeon: Manya Silvas, MD;  Location: [facility] ENDOSCOPY;  Service: Endoscopy;  Laterality:  N/A;  . HEMORRHOID SURGERY    . HERNIA REPAIR Left    x2  . JOINT REPLACEMENT Left    Partial Knee Replacement, Dr. Roland Rack  . PARTIAL KNEE ARTHROPLASTY Left 12/12/2014   Procedure: UNICOMPARTMENTAL KNEE;  Surgeon: Corky Mull, MD;  Location: [facility] ORS;  Service: Orthopedics;  Laterality: Left;  . prostate seeding    . SHOULDER ARTHROSCOPY Right   . SHOULDER ARTHROSCOPY WITH OPEN ROTATOR CUFF REPAIR Left 02/07/2016   Procedure: SHOULDER ARTHROSCOPY WITH OPEN ROTATOR CUFF REPAIR;  Surgeon: Corky Mull, MD;  Location: [facility] ORS;  Service: Orthopedics;  Laterality: Left;    SOCIAL HISTORY: Social History   Socioeconomic History  . Marital status: Married    Spouse name: Not on file  . Number of children: Not on file  . Years of education: Not on file  . Highest education level: Not on file  Occupational History  . Not on file  Social Needs  . Financial resource strain: Not on file  . Food insecurity    Worry: Not on file    Inability: Not on file  . Transportation needs    Medical: Not on file    Non-medical: Not on file  Tobacco Use  . Smoking status: Never Smoker  . Smokeless tobacco: Never Used  Substance and Sexual Activity  . Alcohol use: No  . Drug use: No  . Sexual activity: Yes  Lifestyle  . Physical activity    Days per week: Not on file    Minutes per session: Not on file  . Stress: Not on file  Relationships  . Social Herbalist on phone: Not on file    Gets together: Not on file    Attends religious service: Not on file    Active member of club or organization: Not on file    Attends meetings of clubs or organizations: Not on file    Relationship status: Not on file  . Intimate partner violence    Fear of current or ex partner: Not on file    Emotionally abused: Not on file    Physically abused: Not on file    Forced sexual activity: Not on file  Other Topics Concern  . Not on file  Social History Narrative  . Not on file    FAMILY  HISTORY: Family History  Problem Relation Age of Onset  . Bone cancer Father   . Hypertension Mother   . Osteoporosis Mother   . Breast cancer Sister   . Cancer Brother   . Cancer Brother   . Lung cancer Brother   . Bladder Cancer Brother   . Melanoma Brother     ALLERGIES:  is allergic to oxycodone-acetaminophen; percocet [oxycodone-acetaminophen]; and voltaren [diclofenac sodium].  MEDICATIONS:  Current Outpatient Medications  Medication Sig Dispense Refill  . aspirin 81 MG tablet Take 81 mg by mouth daily. In am    . b complex vitamins capsule Take 1 capsule by mouth daily.    Marland Kitchen dicyclomine (BENTYL) 10 MG capsule Take 10 mg by mouth 4 (four) times daily -  before meals and at bedtime.    . docusate sodium (COLACE) 100 MG capsule Take 200 mg by mouth daily.     . ferrous sulfate 324 MG TBEC Take 324 mg by mouth.    Marland Kitchen  Flaxseed, Linseed, (FLAX SEED OIL) 1000 MG CAPS Take 1 capsule by mouth daily.    Marland Kitchen gabapentin (NEURONTIN) 100 MG capsule Take 100 mg by mouth 2 (two) times daily.    . Garlic 9678 MG CAPS Take 1 capsule by mouth every morning.    . latanoprost (XALATAN) 0.005 % ophthalmic solution Place 1 drop into both eyes at bedtime.    Marland Kitchen levothyroxine (SYNTHROID, LEVOTHROID) 25 MCG tablet Take 25 mcg by mouth daily at 12 noon.    Marland Kitchen lisinopril (PRINIVIL,ZESTRIL) 20 MG tablet Take 10 mg by mouth daily. In am    . loratadine (CLARITIN REDITABS) 10 MG dissolvable tablet Take 10 mg by mouth daily. In am    . Misc Natural Products (BLACK CHERRY CONCENTRATE) LIQD Take 15 mLs by mouth daily.     . Multiple Vitamins-Minerals (MULTIVITAMIN WITH MINERALS) tablet Take 1 tablet by mouth daily. In am    . Omega-3 Fatty Acids (FISH OIL) 1000 MG CAPS Take 1 capsule by mouth daily.    Marland Kitchen omeprazole (PRILOSEC) 20 MG capsule Take 20 mg by mouth daily.     . polyethylene glycol (MIRALAX / GLYCOLAX) packet Take 17 g by mouth daily.    . primidone (MYSOLINE) 50 MG tablet Take 50 mg by mouth 2 (two)  times daily.    Marland Kitchen triamcinolone lotion (KENALOG) 0.1 % Apply 1 application topically once.    . Acalabrutinib (CALQUENCE) 100 MG CAPS Take 100 mg by mouth every 12 (twelve) hours. (Patient not taking: Reported on 10/07/2018) 60 capsule 3  . traMADol (ULTRAM) 50 MG tablet Take 50 mg by mouth.     No current facility-administered medications for this visit.       Marland Kitchen  PHYSICAL EXAMINATION: ECOG PERFORMANCE STATUS: 0 - Asymptomatic Vitals:   10/21/18 1131  BP: 101/63  Pulse: 65  Resp: 18  Temp: 98.3 F (36.8 C)  SpO2: 100%   Filed Weights   10/21/18 1131  Weight: 168 lb 9.6 oz (76.5 kg)   Physical Exam  Constitutional: He is oriented to person, place, and time. No distress.  Walk with a cane  HENT:  Head: Normocephalic and atraumatic.  Nose: Nose normal.  Mouth/Throat: Oropharynx is clear and moist. No oropharyngeal exudate.  Eyes: Pupils are equal, round, and reactive to light. EOM are normal. No scleral icterus.  Neck: Normal range of motion. Neck supple.  Cardiovascular: Normal rate and regular rhythm.  No murmur heard. Pulmonary/Chest: Effort normal. No respiratory distress. He has no rales. He exhibits no tenderness.  Abdominal: Soft. He exhibits no distension. There is no abdominal tenderness.  Musculoskeletal: Normal range of motion.        General: No edema.  Neurological: He is alert and oriented to person, place, and time. No cranial nerve deficit. He exhibits normal muscle tone. Coordination normal.  Skin: Skin is warm and dry. He is not diaphoretic. No erythema.  Psychiatric: Affect normal.  .    LABORATORY DATA:  I have reviewed the data as listed Lab Results  Component Value Date   WBC 112.7 (HH) 10/21/2018   HGB 9.9 (L) 10/21/2018   HCT 32.4 (L) 10/21/2018   MCV 109.8 (H) 10/21/2018   PLT 118 (L) 10/21/2018   Recent Labs    08/25/18 1258 10/07/18 0843 10/21/18 1110  NA 141 140 140  K 4.8 4.5 4.8  CL 107 108 108  CO2 '24 24 24  ' GLUCOSE 152*  105* 108*  BUN 38* 30* 37*  CREATININE  1.52* 1.43* 1.38*  CALCIUM 8.9 9.2 9.7  GFRNONAA 41* 45* 47*  GFRAA 48* 52* 54*  PROT 6.1* 7.0 6.6  ALBUMIN 4.0 4.4 4.0  AST '20 21 16  ' ALT '15 16 15  ' ALKPHOS 65 69 66  BILITOT 0.7 1.0 0.7    RADIOGRAPHIC STUDIES: I have personally reviewed the radiological images as listed and agreed with the findings in the report. no recent images. 11/04/2017  US abdomen Stable right renal cyst. Status post cholecystectomy.Previously seen lesion within the liver is not well appreciated on this exam. No Splenomegaly.   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 Results for CCND1/IGH, ATM, 13q and TP53 were normal.    CLL patients with trisomy 12 as the sole anomaly have an intermediate prognosis. However, recent studies have  shown that trisomy 12 in conjunction with a NOTCH1 mutation, present in about 20% of patients, represents a distinct  subgroup with poor overall survival (see references below). NOTCH1 mutation analysis is not presently available at  [facility]:  1. CLL (chronic lymphocytic leukemia) ([place])   2. Thrombocytopenia ([place])   3. 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 IGVH unmutated Stage III, with anemia Hb 9.8 and extensive bone marrow involvement.   Patient has tolerated acalabrutinib 100 mg twice daily well.  Worsening leukocytosis initially, secondary to medication side effects.  Started to trend down. Anemia hemoglobin 9.9, stable.  Thrombocytopenia, continue to monitor. # CKD, Stage 3. Continue to monitor. Avoid NSAIDs SPEP showed possible monoclonal protein 0.3.  Immunofixation showed IgG  monoclonal protein with lambda light chain specificity. Attempted to call wife and daughter to update above.  Not able to reach any of them.  Repeat blood work in 2 weeks.  Lab MD in 4 weeks.     Orders Placed This Encounter  Procedures  . CBC with Differential/Platelet    Standing Status:   Future    Standing Expiration Date:   10/21/2019  . Comprehensive metabolic panel    Standing Status:   Future    Standing Expiration Date:   10/21/2019  . CBC with Differential/Platelet    Standing Status:   Future    Standing Expiration Date:   10/21/2019  . Comprehensive metabolic panel    Standing Status:   Future    Standing Expiration Date:   10/21/2019   We spent sufficient time to discuss many aspect of care, questions were answered to patient's satisfaction. Total face to face encounter time for this patient visit was 25 min. >50% of the time was  spent in counseling and coordination of care.    [name], MD, PhD

## 2018-10-27 MED FILL — CALQUENCE 100 MG CAPSULE: 100 | 30 days supply | Qty: 60 | Fill #1

## 2018-11-04 ENCOUNTER — Other Ambulatory Visit: Payer: Self-pay

## 2018-11-04 ENCOUNTER — Inpatient Hospital Stay: Payer: Medicare Other

## 2018-11-04 DIAGNOSIS — C9110 Chronic lymphocytic leukemia of B-cell type not having achieved remission: Secondary | ICD-10-CM

## 2018-11-04 LAB — CBC WITH DIFFERENTIAL/PLATELET
Abs Immature Granulocytes: 0.09 10*3/uL — ABNORMAL HIGH (ref 0.00–0.07)
Basophils Absolute: 0.1 10*3/uL (ref 0.0–0.1)
Basophils Relative: 0 %
Eosinophils Absolute: 0.4 10*3/uL (ref 0.0–0.5)
Eosinophils Relative: 1 %
HCT: 31 % — ABNORMAL LOW (ref 39.0–52.0)
Hemoglobin: 9.8 g/dL — ABNORMAL LOW (ref 13.0–17.0)
Immature Granulocytes: 0 %
Lymphocytes Relative: 92 %
Lymphs Abs: 63.3 10*3/uL — ABNORMAL HIGH (ref 0.7–4.0)
MCH: 34.1 pg — ABNORMAL HIGH (ref 26.0–34.0)
MCHC: 31.6 g/dL (ref 30.0–36.0)
MCV: 108 fL — ABNORMAL HIGH (ref 80.0–100.0)
Monocytes Absolute: 0.8 10*3/uL (ref 0.1–1.0)
Monocytes Relative: 1 %
Neutro Abs: 4.2 10*3/uL (ref 1.7–7.7)
Neutrophils Relative %: 6 %
Platelets: 107 10*3/uL — ABNORMAL LOW (ref 150–400)
RBC: 2.87 MIL/uL — ABNORMAL LOW (ref 4.22–5.81)
RDW: 12.2 % (ref 11.5–15.5)
Smear Review: DECREASED
WBC: 68.8 10*3/uL (ref 4.0–10.5)
nRBC: 0 % (ref 0.0–0.2)

## 2018-11-04 LAB — COMPREHENSIVE METABOLIC PANEL
ALT: 14 U/L (ref 0–44)
AST: 18 U/L (ref 15–41)
Albumin: 4 g/dL (ref 3.5–5.0)
Alkaline Phosphatase: 70 U/L (ref 38–126)
Anion gap: 10 (ref 5–15)
BUN: 35 mg/dL — ABNORMAL HIGH (ref 8–23)
CO2: 26 mmol/L (ref 22–32)
Calcium: 9.4 mg/dL (ref 8.9–10.3)
Chloride: 104 mmol/L (ref 98–111)
Creatinine, Ser: 1.36 mg/dL — ABNORMAL HIGH (ref 0.61–1.24)
GFR calc Af Amer: 55 mL/min — ABNORMAL LOW (ref >60)
GFR calc non Af Amer: 47 mL/min — ABNORMAL LOW (ref >60)
Glucose, Bld: 108 mg/dL — ABNORMAL HIGH (ref 70–99)
Potassium: 4.6 mmol/L (ref 3.5–5.1)
Sodium: 140 mmol/L (ref 135–145)
Total Bilirubin: 0.7 mg/dL (ref 0.3–1.2)
Total Protein: 6.7 g/dL (ref 6.5–8.1)

## 2018-11-18 ENCOUNTER — Inpatient Hospital Stay: Payer: Medicare Other | Attending: Oncology

## 2018-11-18 ENCOUNTER — Encounter: Payer: Self-pay | Admitting: Oncology

## 2018-11-18 ENCOUNTER — Inpatient Hospital Stay (HOSPITAL_BASED_OUTPATIENT_CLINIC_OR_DEPARTMENT_OTHER): Payer: Medicare Other | Admitting: Oncology

## 2018-11-18 ENCOUNTER — Other Ambulatory Visit: Payer: Self-pay

## 2018-11-18 VITALS — BP 116/60 | HR 58 | Temp 96.9°F | Resp 16 | Ht 68.5 in | Wt 171.0 lb

## 2018-11-18 DIAGNOSIS — I129 Hypertensive chronic kidney disease with stage 1 through stage 4 chronic kidney disease, or unspecified chronic kidney disease: Secondary | ICD-10-CM | POA: Diagnosis not present

## 2018-11-18 DIAGNOSIS — Z5111 Encounter for antineoplastic chemotherapy: Secondary | ICD-10-CM

## 2018-11-18 DIAGNOSIS — Z79899 Other long term (current) drug therapy: Secondary | ICD-10-CM | POA: Insufficient documentation

## 2018-11-18 DIAGNOSIS — C9110 Chronic lymphocytic leukemia of B-cell type not having achieved remission: Secondary | ICD-10-CM

## 2018-11-18 DIAGNOSIS — D631 Anemia in chronic kidney disease: Secondary | ICD-10-CM | POA: Diagnosis not present

## 2018-11-18 DIAGNOSIS — N1831 Chronic kidney disease, stage 3a: Secondary | ICD-10-CM

## 2018-11-18 DIAGNOSIS — D696 Thrombocytopenia, unspecified: Secondary | ICD-10-CM

## 2018-11-18 LAB — CBC WITH DIFFERENTIAL/PLATELET
Abs Immature Granulocytes: 0.07 10*3/uL (ref 0.00–0.07)
Basophils Absolute: 0.1 10*3/uL (ref 0.0–0.1)
Basophils Relative: 0 %
Eosinophils Absolute: 0.9 10*3/uL — ABNORMAL HIGH (ref 0.0–0.5)
Eosinophils Relative: 2 %
HCT: 32.7 % — ABNORMAL LOW (ref 39.0–52.0)
Hemoglobin: 10.5 g/dL — ABNORMAL LOW (ref 13.0–17.0)
Immature Granulocytes: 0 %
Lymphocytes Relative: 92 %
Lymphs Abs: 52.5 10*3/uL — ABNORMAL HIGH (ref 0.7–4.0)
MCH: 34.5 pg — ABNORMAL HIGH (ref 26.0–34.0)
MCHC: 32.1 g/dL (ref 30.0–36.0)
MCV: 107.6 fL — ABNORMAL HIGH (ref 80.0–100.0)
Monocytes Absolute: 0.7 10*3/uL (ref 0.1–1.0)
Monocytes Relative: 1 %
Neutro Abs: 3.1 10*3/uL (ref 1.7–7.7)
Neutrophils Relative %: 5 %
Platelets: 137 10*3/uL — ABNORMAL LOW (ref 150–400)
RBC: 3.04 MIL/uL — ABNORMAL LOW (ref 4.22–5.81)
RDW: 12.3 % (ref 11.5–15.5)
Smear Review: NORMAL
WBC: 57.3 10*3/uL (ref 4.0–10.5)
nRBC: 0 % (ref 0.0–0.2)

## 2018-11-18 LAB — COMPREHENSIVE METABOLIC PANEL
ALT: 14 U/L (ref 0–44)
AST: 17 U/L (ref 15–41)
Albumin: 4 g/dL (ref 3.5–5.0)
Alkaline Phosphatase: 66 U/L (ref 38–126)
Anion gap: 9 (ref 5–15)
BUN: 39 mg/dL — ABNORMAL HIGH (ref 8–23)
CO2: 25 mmol/L (ref 22–32)
Calcium: 9.7 mg/dL (ref 8.9–10.3)
Chloride: 104 mmol/L (ref 98–111)
Creatinine, Ser: 1.35 mg/dL — ABNORMAL HIGH (ref 0.61–1.24)
GFR calc Af Amer: 55 mL/min — ABNORMAL LOW (ref >60)
GFR calc non Af Amer: 48 mL/min — ABNORMAL LOW (ref >60)
Glucose, Bld: 105 mg/dL — ABNORMAL HIGH (ref 70–99)
Potassium: 4.3 mmol/L (ref 3.5–5.1)
Sodium: 138 mmol/L (ref 135–145)
Total Bilirubin: 0.6 mg/dL (ref 0.3–1.2)
Total Protein: 6.5 g/dL (ref 6.5–8.1)

## 2018-11-18 NOTE — Progress Notes (Signed)
Pt feels the same from CLL. He has arthritis of back and got injections in them and will be getting more. He has no pain today for first time in a while he states

## 2018-11-19 NOTE — Progress Notes (Signed)
Hematology/Oncology Follow Up Note [facility] [phone] [phone]  CONSULT NOTE Patient Care Team: Tracie Harrier, MD as PCP - General (Internal Medicine)  REASON FOR VISIT  follow up for CLL and iron deficiency anemia  HISTORY OF PRESENTING ILLNESS:  Allen Chang 85 y.o.  male with PMH listed as below who was referred by primary care provider to me for evaluation of leukocytosis. He has a history of prostate cancer which was diagnosed 10 years ago. He underwent brachytherapy. He is not any castration treatment. Per patient, he follows up with Dr.Wolf and most recent PSA is normal.   Patient also reports feeling fatigue lately. Recent labs show leukocytosis, mild anemia, macrocytosis, mild thrombocytopenia, low ferritin. Denies blood in the stool. He was started on over the counter iron supplement but wife who manages patient's medication decides to only let patient to take iron medication every other day.   # received IV iron Venofer x 4. Colonoscopy was done on 02/16/2017 which showed polyps, internal hemorrhoids, negative for malignancy.  ##Patient had bone marrow biopsy on 09/22/2018 Pathology showed hypercellular bone marrow with extensive involvement by chronic lymphocytic leukemia. # Started on acalabrutinib 100 mg twice daily on 10/01/2018. INTERVAL HISTORY Allen Chang is a 85 y.o. male who has above history reviewed by me presents for 4 months follow up for CLL and iron deficiency anemia, macrocytic anemia.   Reports feeling well.  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 Appetite is good.  Still have back pain, had steroid injections before. aggrevated by activities involving leaning forward. No pain otday  Denies any headache, nausea, vomiting, fever, chills, cough, chest pain or abdominal pain.  Denies any diarrhea.  Review of Systems  Constitutional: Positive for fatigue. Negative for appetite change, chills,  fever and unexpected weight change.  HENT:   Negative for hearing loss and voice change.   Eyes: Negative for eye problems and icterus.  Respiratory: Negative for chest tightness, cough and shortness of breath.   Cardiovascular: Negative for chest pain and leg swelling.  Gastrointestinal: Negative for abdominal distention and abdominal pain.  Endocrine: Negative for hot flashes.  Genitourinary: Negative for difficulty urinating, dysuria and frequency.   Musculoskeletal: Positive for back pain. Negative for arthralgias.  Skin: Negative for itching and rash.  Neurological: Negative for light-headedness and numbness.  Hematological: Negative for adenopathy. Does not bruise/bleed easily.  Psychiatric/Behavioral: Negative for confusion.    MEDICAL HISTORY:  Past Medical History:  Diagnosis Date  . Cancer [facility])    prostate   . Complication of anesthesia    bradycardia, in ICU for 24 hour after galbladder surgery.   . Diverticulosis 2012  . Dysrhythmia    Heart skips a beat  . Elevated lipids   . GERD (gastroesophageal reflux disease)   . History of hiatal hernia   . Hypertension   . Hypothyroidism   . Leukemia ([place])   . Prostate cancer ([place])   . Skin cancer    face, scalp, behind ear,back and hand  . Tremors of nervous system     SURGICAL HISTORY: Past Surgical History:  Procedure Laterality Date  . BLADDER TUMOR EXCISION    . CARDIAC CATHETERIZATION    . CHOLECYSTECTOMY N/A 10/10/2014   Procedure: LAPAROSCOPIC CHOLECYSTECTOMY WITH INTRAOPERATIVE CHOLANGIOGRAM;  Surgeon: Leonie Green, MD;  Location: [facility] ORS;  Service: General;  Laterality: N/A;  . COLONOSCOPY WITH PROPOFOL N/A 02/16/2017   Procedure: COLONOSCOPY WITH PROPOFOL;  Surgeon: Gaylyn Cheers  T, MD;  Location: [facility] ENDOSCOPY;  Service: Endoscopy;  Laterality: N/A;  . ESOPHAGOGASTRODUODENOSCOPY (EGD) WITH PROPOFOL N/A 02/16/2017   Procedure: ESOPHAGOGASTRODUODENOSCOPY (EGD) WITH PROPOFOL;  Surgeon: Manya Silvas, MD;  Location: [facility] ENDOSCOPY;  Service: Endoscopy;  Laterality: N/A;  . HEMORRHOID SURGERY    . HERNIA REPAIR Left    x2  . JOINT REPLACEMENT Left    Partial Knee Replacement, Dr. Roland Rack  . PARTIAL KNEE ARTHROPLASTY Left 12/12/2014   Procedure: UNICOMPARTMENTAL KNEE;  Surgeon: Corky Mull, MD;  Location: [facility] ORS;  Service: Orthopedics;  Laterality: Left;  . prostate seeding    . SHOULDER ARTHROSCOPY Right   . SHOULDER ARTHROSCOPY WITH OPEN ROTATOR CUFF REPAIR Left 02/07/2016   Procedure: SHOULDER ARTHROSCOPY WITH OPEN ROTATOR CUFF REPAIR;  Surgeon: Corky Mull, MD;  Location: [facility] ORS;  Service: Orthopedics;  Laterality: Left;    SOCIAL HISTORY: Social History   Socioeconomic History  . Marital status: Married    Spouse name: Not on file  . Number of children: Not on file  . Years of education: Not on file  . Highest education level: Not on file  Occupational History  . Not on file  Social Needs  . Financial resource strain: Not on file  . Food insecurity    Worry: Not on file    Inability: Not on file  . Transportation needs    Medical: Not on file    Non-medical: Not on file  Tobacco Use  . Smoking status: Never Smoker  . Smokeless tobacco: Never Used  Substance and Sexual Activity  . Alcohol use: No  . Drug use: No  . Sexual activity: Yes  Lifestyle  . Physical activity    Days per week: Not on file    Minutes per session: Not on file  . Stress: Not on file  Relationships  . Social Herbalist on phone: Not on file    Gets together: Not on file    Attends religious service: Not on file    Active member of club or organization: Not on file    Attends meetings of clubs or organizations: Not on file    Relationship status: Not on file  . Intimate partner violence    Fear of current or ex partner: Not on file    Emotionally abused: Not on file    Physically abused: Not on file    Forced sexual activity: Not on file  Other Topics Concern   . Not on file  Social History Narrative  . Not on file    FAMILY HISTORY: Family History  Problem Relation Age of Onset  . Bone cancer Father   . Hypertension Mother   . Osteoporosis Mother   . Breast cancer Sister   . Cancer Brother   . Cancer Brother   . Lung cancer Brother   . Bladder Cancer Brother   . Melanoma Brother     ALLERGIES:  is allergic to oxycodone-acetaminophen; percocet [oxycodone-acetaminophen]; and voltaren [diclofenac sodium].  MEDICATIONS:  Current Outpatient Medications  Medication Sig Dispense Refill  . Acalabrutinib (CALQUENCE) 100 MG CAPS Take 100 mg by mouth every 12 (twelve) hours. 60 capsule 3  . aspirin 81 MG tablet Take 81 mg by mouth daily. In am    . b complex vitamins capsule Take 1 capsule by mouth daily.    Marland Kitchen dicyclomine (BENTYL) 10 MG capsule Take 10 mg by mouth 4 (four) times daily -  before meals and  at bedtime.    . docusate sodium (COLACE) 100 MG capsule Take 200 mg by mouth daily.     . ferrous sulfate 324 MG TBEC Take 324 mg by mouth daily with breakfast.     . Flaxseed, Linseed, (FLAX SEED OIL) 1000 MG CAPS Take 1 capsule by mouth daily.    Marland Kitchen gabapentin (NEURONTIN) 100 MG capsule Take 100 mg by mouth 2 (two) times daily.    . Garlic 1914 MG CAPS Take 1 capsule by mouth every morning.    . latanoprost (XALATAN) 0.005 % ophthalmic solution Place 1 drop into both eyes at bedtime.    Marland Kitchen levothyroxine (SYNTHROID, LEVOTHROID) 25 MCG tablet Take 25 mcg by mouth daily at 12 noon.    Marland Kitchen lisinopril (PRINIVIL,ZESTRIL) 20 MG tablet Take 10 mg by mouth daily. In am    . loratadine (CLARITIN REDITABS) 10 MG dissolvable tablet Take 10 mg by mouth daily. In am    . Misc Natural Products (BLACK CHERRY CONCENTRATE) LIQD Take 15 mLs by mouth daily.     . Multiple Vitamins-Minerals (MULTIVITAMIN WITH MINERALS) tablet Take 1 tablet by mouth daily. In am    . Omega-3 Fatty Acids (FISH OIL) 1000 MG CAPS Take 1 capsule by mouth daily.    Marland Kitchen omeprazole  (PRILOSEC) 20 MG capsule Take 20 mg by mouth daily.     . polyethylene glycol (MIRALAX / GLYCOLAX) packet Take 17 g by mouth daily.    . primidone (MYSOLINE) 50 MG tablet Take 50 mg by mouth 2 (two) times daily.    . traMADol (ULTRAM) 50 MG tablet Take 50 mg by mouth every 6 (six) hours as needed.     . triamcinolone lotion (KENALOG) 0.1 % Apply 1 application topically once.     No current facility-administered medications for this visit.       Marland Kitchen  PHYSICAL EXAMINATION: ECOG PERFORMANCE STATUS: 0 - Asymptomatic Vitals:   11/18/18 1014  BP: 116/60  Pulse: (!) 58  Resp: 16  Temp: (!) 96.9 F (36.1 C)   Filed Weights   11/18/18 1014  Weight: 171 lb (77.6 kg)   Physical Exam  Constitutional: He is oriented to person, place, and time. No distress.  Walk with a cane  HENT:  Head: Normocephalic and atraumatic.  Nose: Nose normal.  Mouth/Throat: Oropharynx is clear and moist. No oropharyngeal exudate.  Eyes: Pupils are equal, round, and reactive to light. EOM are normal. No scleral icterus.  Neck: Normal range of motion. Neck supple.  Cardiovascular: Normal rate and regular rhythm.  No murmur heard. Pulmonary/Chest: Effort normal. No respiratory distress. He has no rales. He exhibits no tenderness.  Abdominal: Soft. He exhibits no distension. There is no abdominal tenderness.  Musculoskeletal: Normal range of motion.        General: No edema.  Neurological: He is alert and oriented to person, place, and time. No cranial nerve deficit. He exhibits normal muscle tone. Coordination normal.  Skin: Skin is warm and dry. He is not diaphoretic. No erythema.  Psychiatric: Affect normal.  .    LABORATORY DATA:  I have reviewed the data as listed Lab Results  Component Value Date   WBC 57.3 (HH) 11/18/2018   HGB 10.5 (L) 11/18/2018   HCT 32.7 (L) 11/18/2018   MCV 107.6 (H) 11/18/2018   PLT 137 (L) 11/18/2018   Recent Labs    10/21/18 1110 11/04/18 1016 11/18/18 0950  NA  140 140 138  K 4.8 4.6 4.3  CL  108 104 104  CO2 _0 GLUCOSE 108* 108* 105*  BUN 37* 35* 39*  CREATININE 1.38* 1.36* 1.35*  CALCIUM 9.7 9.4 9.7  GFRNONAA 47* 47* 48*  GFRAA 54* 55* 55*  PROT 6.6 6.7 6.5  ALBUMIN 4.0 4.0 4.0  AST _1 ALT _2 ALKPHOS 66 70 66  BILITOT 0.7 0.7 0.6    RADIOGRAPHIC STUDIES: I have personally reviewed the radiological images as listed and agreed with the findings in the report. no recent images. 11/04/2017  US abdomen Stable right renal cyst. Status post cholecystectomy.Previously seen lesion within the liver is not well appreciated on this exam. No Splenomegaly.   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 Results for CCND1/IGH, ATM, 13q and TP53 were normal.    CLL patients with trisomy 12 as the sole anomaly have an intermediate prognosis. However, recent studies have  shown that trisomy 12 in conjunction with a NOTCH1 mutation, present in about 20% of patients, represents a distinct  subgroup with poor overall survival (see references below). NOTCH1 mutation analysis is not presently available at  [place]:  1. CLL (chronic lymphocytic leukemia) ([place])   2. Thrombocytopenia ([place])   3. Encounter for antineoplastic chemotherapy   4. Stage 3a chronic kidney disease    # CLL, intermediate risk with trisomy 12. IGVH unmutated Stage III, with anemia Hb 9.8 and extensive bone marrow involvement.   continue acalabrutinib 100 mg twice daily  Labs are reviewed and discussed with patient. Leukocytosis is trending down. .  # Anemia, Hb trending up. Continue monitor  # Thrombocytopenia, improved.  # CKD, Stage 3. Stable Cr .Continue to  monitor. Avoid NSAIDs   Orders Placed This Encounter  Procedures  . CBC with Differential/Platelet    Standing Status:   Future    Standing Expiration Date:   11/18/2019  . Comprehensive metabolic panel    Standing Status:   Future    Standing Expiration Date:   11/18/2019   Follow up in 8 weeks.  [name], MD, PhD

## 2018-11-23 MED FILL — CALQUENCE 100 MG CAPSULE: 100 | 30 days supply | Qty: 60 | Fill #2

## 2018-12-23 MED FILL — CALQUENCE 100 MG CAPSULE: 100 | 30 days supply | Qty: 60 | Fill #3

## 2019-01-14 ENCOUNTER — Ambulatory Visit: Payer: Medicare Other | Admitting: Oncology

## 2019-01-14 ENCOUNTER — Other Ambulatory Visit: Payer: Medicare Other

## 2019-01-18 ENCOUNTER — Inpatient Hospital Stay: Payer: Medicare Other | Attending: Oncology

## 2019-01-18 ENCOUNTER — Other Ambulatory Visit: Payer: Self-pay

## 2019-01-18 ENCOUNTER — Encounter: Payer: Self-pay | Admitting: Oncology

## 2019-01-18 ENCOUNTER — Inpatient Hospital Stay (HOSPITAL_BASED_OUTPATIENT_CLINIC_OR_DEPARTMENT_OTHER): Payer: Medicare Other | Admitting: Oncology

## 2019-01-18 VITALS — BP 112/66 | HR 59 | Temp 96.6°F | Resp 18 | Wt 172.2 lb

## 2019-01-18 DIAGNOSIS — D631 Anemia in chronic kidney disease: Secondary | ICD-10-CM | POA: Diagnosis not present

## 2019-01-18 DIAGNOSIS — C9110 Chronic lymphocytic leukemia of B-cell type not having achieved remission: Secondary | ICD-10-CM | POA: Diagnosis present

## 2019-01-18 DIAGNOSIS — N1831 Chronic kidney disease, stage 3a: Secondary | ICD-10-CM | POA: Insufficient documentation

## 2019-01-18 DIAGNOSIS — D696 Thrombocytopenia, unspecified: Secondary | ICD-10-CM | POA: Diagnosis not present

## 2019-01-18 DIAGNOSIS — Z79899 Other long term (current) drug therapy: Secondary | ICD-10-CM | POA: Insufficient documentation

## 2019-01-18 DIAGNOSIS — D539 Nutritional anemia, unspecified: Secondary | ICD-10-CM

## 2019-01-18 DIAGNOSIS — Z5111 Encounter for antineoplastic chemotherapy: Secondary | ICD-10-CM

## 2019-01-18 DIAGNOSIS — I129 Hypertensive chronic kidney disease with stage 1 through stage 4 chronic kidney disease, or unspecified chronic kidney disease: Secondary | ICD-10-CM | POA: Insufficient documentation

## 2019-01-18 LAB — COMPREHENSIVE METABOLIC PANEL
ALT: 16 U/L (ref 0–44)
AST: 18 U/L (ref 15–41)
Albumin: 3.8 g/dL (ref 3.5–5.0)
Alkaline Phosphatase: 50 U/L (ref 38–126)
Anion gap: 7 (ref 5–15)
BUN: 37 mg/dL — ABNORMAL HIGH (ref 8–23)
CO2: 24 mmol/L (ref 22–32)
Calcium: 9.3 mg/dL (ref 8.9–10.3)
Chloride: 108 mmol/L (ref 98–111)
Creatinine, Ser: 1.3 mg/dL — ABNORMAL HIGH (ref 0.61–1.24)
GFR calc Af Amer: 58 mL/min — ABNORMAL LOW (ref >60)
GFR calc non Af Amer: 50 mL/min — ABNORMAL LOW (ref >60)
Glucose, Bld: 107 mg/dL — ABNORMAL HIGH (ref 70–99)
Potassium: 4.4 mmol/L (ref 3.5–5.1)
Sodium: 139 mmol/L (ref 135–145)
Total Bilirubin: 0.5 mg/dL (ref 0.3–1.2)
Total Protein: 6.7 g/dL (ref 6.5–8.1)

## 2019-01-18 LAB — CBC WITH DIFFERENTIAL/PLATELET
Abs Immature Granulocytes: 0 10*3/uL (ref 0.00–0.07)
Basophils Absolute: 0 10*3/uL (ref 0.0–0.1)
Basophils Relative: 0 %
Eosinophils Absolute: 0.1 10*3/uL (ref 0.0–0.5)
Eosinophils Relative: 1 %
HCT: 32 % — ABNORMAL LOW (ref 39.0–52.0)
Hemoglobin: 10.2 g/dL — ABNORMAL LOW (ref 13.0–17.0)
Lymphocytes Relative: 85 %
Lymphs Abs: 9.4 10*3/uL — ABNORMAL HIGH (ref 0.7–4.0)
MCH: 34.2 pg — ABNORMAL HIGH (ref 26.0–34.0)
MCHC: 31.9 g/dL (ref 30.0–36.0)
MCV: 107.4 fL — ABNORMAL HIGH (ref 80.0–100.0)
Monocytes Absolute: 0 10*3/uL — ABNORMAL LOW (ref 0.1–1.0)
Monocytes Relative: 0 %
Neutro Abs: 1.6 10*3/uL — ABNORMAL LOW (ref 1.7–7.7)
Neutrophils Relative %: 14 %
Platelets: 124 10*3/uL — ABNORMAL LOW (ref 150–400)
RBC: 2.98 MIL/uL — ABNORMAL LOW (ref 4.22–5.81)
RDW: 12.6 % (ref 11.5–15.5)
Smear Review: DECREASED
WBC Morphology: ABNORMAL
WBC: 11.1 10*3/uL — ABNORMAL HIGH (ref 4.0–10.5)
nRBC: 0 % (ref 0.0–0.2)

## 2019-01-18 NOTE — Progress Notes (Signed)
Patient reports his chronic tremors are worse and is followed by PCP.

## 2019-01-19 ENCOUNTER — Other Ambulatory Visit: Payer: Self-pay | Admitting: Oncology

## 2019-01-19 ENCOUNTER — Encounter: Payer: Self-pay | Admitting: Oncology

## 2019-01-19 DIAGNOSIS — C9110 Chronic lymphocytic leukemia of B-cell type not having achieved remission: Secondary | ICD-10-CM

## 2019-01-19 NOTE — Progress Notes (Signed)
Hematology/Oncology Follow Up Note [facility] Telephone:([phone] [phone]  CONSULT NOTE Patient Care Team: Tracie Harrier, MD as PCP - General (Internal Medicine)  REASON FOR VISIT  follow up for CLL and iron deficiency anemia  HISTORY OF PRESENTING ILLNESS:  Allen Chang 85 y.o.  male with PMH listed as below who was referred by primary care provider to me for evaluation of leukocytosis. He has a history of prostate cancer which was diagnosed 10 years ago. He underwent brachytherapy. He is not any castration treatment. Per patient, he follows up with Dr.Wolf and most recent PSA is normal.   Patient also reports feeling fatigue lately. Recent labs show leukocytosis, mild anemia, macrocytosis, mild thrombocytopenia, low ferritin. Denies blood in the stool. He was started on over the counter iron supplement but wife who manages patient's medication decides to only let patient to take iron medication every other day.   # received IV iron Venofer x 4. Colonoscopy was done on 02/16/2017 which showed polyps, internal hemorrhoids, negative for malignancy.  ##Patient had bone marrow biopsy on 09/22/2018 Pathology showed hypercellular bone marrow with extensive involvement by chronic lymphocytic leukemia. # Started on acalabrutinib 100 mg twice daily on 10/01/2018. INTERVAL HISTORY Allen Chang is a 85 y.o. male who has above history reviewed by me presents for 4 months follow up for CLL and iron deficiency anemia, macrocytic anemia.  Patient reports that he is feeling a lot better now.  Fatigue has improved.  Appetite is always good. He has gained weight.  He continues to ha hip pain. Denies any headache, nausea, vomiting, fever, chills, cough, chest pain or abdominal pain.  Denies any diarrhea    Review of Systems  Constitutional: Positive for fatigue. Negative for appetite change, chills, fever and unexpected weight change.  HENT:   Negative for  hearing loss and voice change.   Eyes: Negative for eye problems and icterus.  Respiratory: Negative for chest tightness, cough and shortness of breath.   Cardiovascular: Negative for chest pain and leg swelling.  Gastrointestinal: Negative for abdominal distention and abdominal pain.  Endocrine: Negative for hot flashes.  Genitourinary: Negative for difficulty urinating, dysuria and frequency.   Musculoskeletal: Positive for arthralgias and back pain.  Skin: Negative for itching and rash.  Neurological: Negative for light-headedness and numbness.  Hematological: Negative for adenopathy. Does not bruise/bleed easily.  Psychiatric/Behavioral: Negative for confusion.    MEDICAL HISTORY:  Past Medical History:  Diagnosis Date  . Cancer [facility])    prostate   . Complication of anesthesia    bradycardia, in ICU for 24 hour after galbladder surgery.   . Diverticulosis 2012  . Dysrhythmia    Heart skips a beat  . Elevated lipids   . GERD (gastroesophageal reflux disease)   . History of hiatal hernia   . Hypertension   . Hypothyroidism   . Leukemia ([place])   . Prostate cancer ([place])   . Skin cancer    face, scalp, behind ear,back and hand  . Tremors of nervous system     SURGICAL HISTORY: Past Surgical History:  Procedure Laterality Date  . BLADDER TUMOR EXCISION    . CARDIAC CATHETERIZATION    . CHOLECYSTECTOMY N/A 10/10/2014   Procedure: LAPAROSCOPIC CHOLECYSTECTOMY WITH INTRAOPERATIVE CHOLANGIOGRAM;  Surgeon: Leonie Green, MD;  Location: [facility] ORS;  Service: General;  Laterality: N/A;  . COLONOSCOPY WITH PROPOFOL N/A 02/16/2017   Procedure: COLONOSCOPY WITH PROPOFOL;  Surgeon: Manya Silvas, MD;  Location: [facility] ENDOSCOPY;  Service: Endoscopy;  Laterality: N/A;  . ESOPHAGOGASTRODUODENOSCOPY (EGD) WITH PROPOFOL N/A 02/16/2017   Procedure: ESOPHAGOGASTRODUODENOSCOPY (EGD) WITH PROPOFOL;  Surgeon: Manya Silvas, MD;  Location: [facility] ENDOSCOPY;  Service: Endoscopy;   Laterality: N/A;  . HEMORRHOID SURGERY    . HERNIA REPAIR Left    x2  . JOINT REPLACEMENT Left    Partial Knee Replacement, Dr. Roland Rack  . PARTIAL KNEE ARTHROPLASTY Left 12/12/2014   Procedure: UNICOMPARTMENTAL KNEE;  Surgeon: Corky Mull, MD;  Location: [facility] ORS;  Service: Orthopedics;  Laterality: Left;  . prostate seeding    . SHOULDER ARTHROSCOPY Right   . SHOULDER ARTHROSCOPY WITH OPEN ROTATOR CUFF REPAIR Left 02/07/2016   Procedure: SHOULDER ARTHROSCOPY WITH OPEN ROTATOR CUFF REPAIR;  Surgeon: Corky Mull, MD;  Location: [facility] ORS;  Service: Orthopedics;  Laterality: Left;    SOCIAL HISTORY: Social History   Socioeconomic History  . Marital status: Married    Spouse name: Not on file  . Number of children: Not on file  . Years of education: Not on file  . Highest education level: Not on file  Occupational History  . Not on file  Social Needs  . Financial resource strain: Not on file  . Food insecurity    Worry: Not on file    Inability: Not on file  . Transportation needs    Medical: Not on file    Non-medical: Not on file  Tobacco Use  . Smoking status: Never Smoker  . Smokeless tobacco: Never Used  Substance and Sexual Activity  . Alcohol use: No  . Drug use: No  . Sexual activity: Yes  Lifestyle  . Physical activity    Days per week: Not on file    Minutes per session: Not on file  . Stress: Not on file  Relationships  . Social Herbalist on phone: Not on file    Gets together: Not on file    Attends religious service: Not on file    Active member of club or organization: Not on file    Attends meetings of clubs or organizations: Not on file    Relationship status: Not on file  . Intimate partner violence    Fear of current or ex partner: Not on file    Emotionally abused: Not on file    Physically abused: Not on file    Forced sexual activity: Not on file  Other Topics Concern  . Not on file  Social History Narrative  . Not on file     FAMILY HISTORY: Family History  Problem Relation Age of Onset  . Bone cancer Father   . Hypertension Mother   . Osteoporosis Mother   . Breast cancer Sister   . Cancer Brother   . Cancer Brother   . Lung cancer Brother   . Bladder Cancer Brother   . Melanoma Brother     ALLERGIES:  is allergic to oxycodone-acetaminophen; percocet [oxycodone-acetaminophen]; and voltaren [diclofenac sodium].  MEDICATIONS:  Current Outpatient Medications  Medication Sig Dispense Refill  . Acalabrutinib (CALQUENCE) 100 MG CAPS Take 100 mg by mouth every 12 (twelve) hours. 60 capsule 3  . aspirin 81 MG tablet Take 81 mg by mouth daily. In am    . b complex vitamins capsule Take 1 capsule by mouth daily.    Marland Kitchen dicyclomine (BENTYL) 10 MG capsule Take 10 mg by mouth 4 (four) times daily -  before meals and at bedtime.    . docusate  sodium (COLACE) 100 MG capsule Take 200 mg by mouth daily.     . ferrous sulfate 324 MG TBEC Take 324 mg by mouth daily with breakfast.     . Flaxseed, Linseed, (FLAX SEED OIL) 1000 MG CAPS Take 1 capsule by mouth daily.    Marland Kitchen gabapentin (NEURONTIN) 100 MG capsule Take 100 mg by mouth 2 (two) times daily.    . Garlic 6004 MG CAPS Take 1 capsule by mouth every morning.    . latanoprost (XALATAN) 0.005 % ophthalmic solution Place 1 drop into both eyes at bedtime.    Marland Kitchen levothyroxine (SYNTHROID, LEVOTHROID) 25 MCG tablet Take 25 mcg by mouth daily at 12 noon.    Marland Kitchen lisinopril (PRINIVIL,ZESTRIL) 20 MG tablet Take 10 mg by mouth daily. In am    . loratadine (CLARITIN REDITABS) 10 MG dissolvable tablet Take 10 mg by mouth daily. In am    . Misc Natural Products (BLACK CHERRY CONCENTRATE) LIQD Take 15 mLs by mouth daily.     . Multiple Vitamins-Minerals (MULTIVITAMIN WITH MINERALS) tablet Take 1 tablet by mouth daily. In am    . Omega-3 Fatty Acids (FISH OIL) 1000 MG CAPS Take 1 capsule by mouth daily.    Marland Kitchen omeprazole (PRILOSEC) 20 MG capsule Take 20 mg by mouth daily.     . polyethylene  glycol (MIRALAX / GLYCOLAX) packet Take 17 g by mouth daily.    . primidone (MYSOLINE) 50 MG tablet Take 50 mg by mouth 2 (two) times daily.    . traMADol (ULTRAM) 50 MG tablet Take 50 mg by mouth every 6 (six) hours as needed.     . triamcinolone lotion (KENALOG) 0.1 % Apply 1 application topically once.     No current facility-administered medications for this visit.       Marland Kitchen  PHYSICAL EXAMINATION: ECOG PERFORMANCE STATUS: 0 - Asymptomatic Vitals:   01/18/19 1342  BP: 112/66  Pulse: (!) 59  Resp: 18  Temp: (!) 96.6 F (35.9 C)   Filed Weights   01/18/19 1342  Weight: 172 lb 3.2 oz (78.1 kg)   Physical Exam  Constitutional: He is oriented to person, place, and time. No distress.  Walk with a cane  HENT:  Head: Normocephalic and atraumatic.  Nose: Nose normal.  Mouth/Throat: Oropharynx is clear and moist. No oropharyngeal exudate.  Eyes: Pupils are equal, round, and reactive to light. EOM are normal. No scleral icterus.  Neck: Normal range of motion. Neck supple.  Cardiovascular: Normal rate and regular rhythm.  No murmur heard. Pulmonary/Chest: Effort normal. No respiratory distress. He has no rales. He exhibits no tenderness.  Abdominal: Soft. He exhibits no distension. There is no abdominal tenderness.  Musculoskeletal: Normal range of motion.        General: No edema.  Neurological: He is alert and oriented to person, place, and time. No cranial nerve deficit. He exhibits normal muscle tone. Coordination normal.  Skin: Skin is warm and dry. He is not diaphoretic. No erythema.  Psychiatric: Affect normal.  .    LABORATORY DATA:  I have reviewed the data as listed Lab Results  Component Value Date   WBC 11.1 (H) 01/18/2019   HGB 10.2 (L) 01/18/2019   HCT 32.0 (L) 01/18/2019   MCV 107.4 (H) 01/18/2019   PLT 124 (L) 01/18/2019   Recent Labs    11/04/18 1016 11/18/18 0950 01/18/19 1312  NA 140 138 139  K 4.6 4.3 4.4  CL 104 104 108  CO2 26  25 24   GLUCOSE 108* 105* 107*  BUN 35* 39* 37*  CREATININE 1.36* 1.35* 1.30*  CALCIUM 9.4 9.7 9.3  GFRNONAA 47* 48* 50*  GFRAA 55* 55* 58*  PROT 6.7 6.5 6.7  ALBUMIN 4.0 4.0 3.8  AST _0 ALT _1 ALKPHOS 70 66 50  BILITOT 0.7 0.6 0.5    RADIOGRAPHIC STUDIES: I have personally reviewed the radiological images as listed and agreed with the findings in the report. no recent images. 11/04/2017  US abdomen Stable right renal cyst. Status post cholecystectomy.Previously seen lesion within the liver is not well appreciated on this exam. No Splenomegaly.   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 Results for CCND1/IGH, ATM, 13q and TP53 were normal.    CLL patients with trisomy 12 as the sole anomaly have an intermediate prognosis. However, recent studies have  shown that trisomy 12 in conjunction with a NOTCH1 mutation, present in about 20% of patients, represents a distinct  subgroup with poor overall survival (see references below). NOTCH1 mutation analysis is not presently available at  [place]:  1. CLL (chronic lymphocytic leukemia) ([place])   2. Thrombocytopenia ([place])   3. Encounter for antineoplastic chemotherapy   4. Stage 3a chronic kidney disease   5. Macrocytic anemia    # CLL, intermediate risk with trisomy 12. IGVH unmutated. Stage III, extensive bone marrow involvement. Currently on acalabrutinib 100 mg twice daily. Tolerating well. Labs are reviewed and discussed with patient. Leukocytosis has trended down, indicating good treatment response.  #Anemia multifactorial.  Secondary to CLL, anemia secondary to CKD. Hemoglobin today is 10.2.  Stable.monitor. Macrocytosis,  maybe related to chemotherapy. check vitamin B12 and folate level at the next visit.   # Thrombocytopenia, platelet counts 1 24,000.  Stable.  Continue to monitor..  # CKD, Stage 3.  Stable creatinine level.  Continue to monitor. Avoid NSAIDs   Orders Placed This Encounter  Procedures  . CBC with Differential    Standing Status:   Future    Standing Expiration Date:   01/18/2020  . Comprehensive metabolic panel    Standing Status:   Future    Standing Expiration Date:   01/18/2020   Follow up in 6 -8 weeks.   [name], MD, PhD Hematology Oncology [place] at [facility] 01/19/2019

## 2019-01-20 MED FILL — CALQUENCE 100 MG CAPSULE: 100 | 30 days supply | Qty: 60 | Fill #0

## 2019-01-31 ENCOUNTER — Telehealth: Payer: Self-pay | Admitting: Pharmacy Technician

## 2019-01-31 NOTE — Telephone Encounter (Signed)
Oral Oncology Patient Advocate Encounter  Received notification from [employer] that prior authorization for Calquence is required.  PA submitted on CoverMyMeds Key [id number] Status is pending  Oral Oncology Clinic will continue to follow.  CoverMyMeds sent a fax reminder that the patients prior authorization for Calquence was going to expire.  [place] Patient [place] Phone [phone] Fax [phone] 01/31/2019 10:48 AM

## 2019-01-31 NOTE — Telephone Encounter (Signed)
Oral Oncology Patient Advocate Encounter  Prior Authorization for Calquence has been approved.    PA# [id number] Effective dates: 01/31/2019 through 02/17/2020   Oral Oncology Clinic will continue to follow.   [place] Patient [place] Phone [phone] Fax [phone] 01/31/2019 10:49 AM

## 2019-02-15 MED FILL — CALQUENCE 100 MG CAPSULE: 100 | 30 days supply | Qty: 60 | Fill #1

## 2019-03-01 ENCOUNTER — Inpatient Hospital Stay: Payer: Medicare Other | Attending: Oncology

## 2019-03-01 ENCOUNTER — Inpatient Hospital Stay (HOSPITAL_BASED_OUTPATIENT_CLINIC_OR_DEPARTMENT_OTHER): Payer: Medicare Other | Admitting: Oncology

## 2019-03-01 ENCOUNTER — Encounter: Payer: Self-pay | Admitting: Oncology

## 2019-03-01 ENCOUNTER — Other Ambulatory Visit: Payer: Self-pay

## 2019-03-01 VITALS — BP 127/74 | HR 60 | Temp 98.2°F | Resp 18 | Wt 173.1 lb

## 2019-03-01 DIAGNOSIS — D696 Thrombocytopenia, unspecified: Secondary | ICD-10-CM | POA: Insufficient documentation

## 2019-03-01 DIAGNOSIS — C9110 Chronic lymphocytic leukemia of B-cell type not having achieved remission: Secondary | ICD-10-CM | POA: Insufficient documentation

## 2019-03-01 DIAGNOSIS — N1831 Chronic kidney disease, stage 3a: Secondary | ICD-10-CM | POA: Insufficient documentation

## 2019-03-01 DIAGNOSIS — D539 Nutritional anemia, unspecified: Secondary | ICD-10-CM | POA: Diagnosis not present

## 2019-03-01 DIAGNOSIS — Z79899 Other long term (current) drug therapy: Secondary | ICD-10-CM | POA: Diagnosis not present

## 2019-03-01 DIAGNOSIS — Z5111 Encounter for antineoplastic chemotherapy: Secondary | ICD-10-CM | POA: Diagnosis not present

## 2019-03-01 DIAGNOSIS — I129 Hypertensive chronic kidney disease with stage 1 through stage 4 chronic kidney disease, or unspecified chronic kidney disease: Secondary | ICD-10-CM | POA: Diagnosis not present

## 2019-03-01 LAB — CBC WITH DIFFERENTIAL/PLATELET
Abs Immature Granulocytes: 0.03 10*3/uL (ref 0.00–0.07)
Basophils Absolute: 0 10*3/uL (ref 0.0–0.1)
Basophils Relative: 1 %
Eosinophils Absolute: 0.1 10*3/uL (ref 0.0–0.5)
Eosinophils Relative: 1 %
HCT: 37.1 % — ABNORMAL LOW (ref 39.0–52.0)
Hemoglobin: 11.8 g/dL — ABNORMAL LOW (ref 13.0–17.0)
Immature Granulocytes: 0 %
Lymphocytes Relative: 43 %
Lymphs Abs: 3.6 10*3/uL (ref 0.7–4.0)
MCH: 33.9 pg (ref 26.0–34.0)
MCHC: 31.8 g/dL (ref 30.0–36.0)
MCV: 106.6 fL — ABNORMAL HIGH (ref 80.0–100.0)
Monocytes Absolute: 0.8 10*3/uL (ref 0.1–1.0)
Monocytes Relative: 9 %
Neutro Abs: 4 10*3/uL (ref 1.7–7.7)
Neutrophils Relative %: 46 %
Platelets: 109 10*3/uL — ABNORMAL LOW (ref 150–400)
RBC: 3.48 MIL/uL — ABNORMAL LOW (ref 4.22–5.81)
RDW: 12.6 % (ref 11.5–15.5)
WBC: 8.5 10*3/uL (ref 4.0–10.5)
nRBC: 0 % (ref 0.0–0.2)

## 2019-03-01 LAB — COMPREHENSIVE METABOLIC PANEL
ALT: 16 U/L (ref 0–44)
AST: 19 U/L (ref 15–41)
Albumin: 3.8 g/dL (ref 3.5–5.0)
Alkaline Phosphatase: 56 U/L (ref 38–126)
Anion gap: 7 (ref 5–15)
BUN: 30 mg/dL — ABNORMAL HIGH (ref 8–23)
CO2: 26 mmol/L (ref 22–32)
Calcium: 9.4 mg/dL (ref 8.9–10.3)
Chloride: 107 mmol/L (ref 98–111)
Creatinine, Ser: 1.45 mg/dL — ABNORMAL HIGH (ref 0.61–1.24)
GFR calc Af Amer: 51 mL/min — ABNORMAL LOW (ref >60)
GFR calc non Af Amer: 44 mL/min — ABNORMAL LOW (ref >60)
Glucose, Bld: 105 mg/dL — ABNORMAL HIGH (ref 70–99)
Potassium: 4.6 mmol/L (ref 3.5–5.1)
Sodium: 140 mmol/L (ref 135–145)
Total Bilirubin: 0.6 mg/dL (ref 0.3–1.2)
Total Protein: 6.7 g/dL (ref 6.5–8.1)

## 2019-03-01 NOTE — Progress Notes (Signed)
Hematology/Oncology Follow Up Note [facility] [phone] [phone]  CONSULT NOTE Patient Care Team: Tracie Harrier, MD as PCP - General (Internal Medicine)  REASON FOR VISIT  follow up for CLL and iron deficiency anemia  HISTORY OF PRESENTING ILLNESS:  Allen Chang 86 y.o.  male with PMH listed as below who was referred by primary care provider to me for evaluation of leukocytosis. He has a history of prostate cancer which was diagnosed 10 years ago. He underwent brachytherapy. He is not any castration treatment. Per patient, he follows up with Dr.Wolf and most recent PSA is normal.   Patient also reports feeling fatigue lately. Recent labs show leukocytosis, mild anemia, macrocytosis, mild thrombocytopenia, low ferritin. Denies blood in the stool. He was started on over the counter iron supplement but wife who manages patient's medication decides to only let patient to take iron medication every other day.   # received IV iron Venofer x 4. Colonoscopy was done on 02/16/2017 which showed polyps, internal hemorrhoids, negative for malignancy.  ##Patient had bone marrow biopsy on 09/22/2018 Pathology showed hypercellular bone marrow with extensive involvement by chronic lymphocytic leukemia. # Started on acalabrutinib 100 mg twice daily on 10/01/2018. INTERVAL HISTORY Allen Chang is a 86 y.o. male who has above history reviewed by me presents for 4 months follow up for CLL and iron deficiency anemia, macrocytic anemia.  Patient reports that he has been feeling quite well. Fatigue has improved. He continues to have some chronic intermittent hip pain.  He is able to walk independently now. Patient has been taking  acalabrutinib 100 mg twice and tolerates well.     Review of Systems  Constitutional: Negative for appetite change, chills, fatigue, fever and unexpected weight change.  HENT:   Negative for hearing loss and voice change.     Eyes: Negative for eye problems and icterus.  Respiratory: Negative for chest tightness, cough and shortness of breath.   Cardiovascular: Negative for chest pain and leg swelling.  Gastrointestinal: Negative for abdominal distention and abdominal pain.  Endocrine: Negative for hot flashes.  Genitourinary: Negative for difficulty urinating, dysuria and frequency.   Musculoskeletal: Positive for arthralgias. Negative for back pain.  Skin: Negative for itching and rash.  Neurological: Negative for light-headedness and numbness.  Hematological: Negative for adenopathy. Does not bruise/bleed easily.  Psychiatric/Behavioral: Negative for confusion.    MEDICAL HISTORY:  Past Medical History:  Diagnosis Date  . Cancer [facility])    prostate   . Complication of anesthesia    bradycardia, in ICU for 24 hour after galbladder surgery.   . Diverticulosis 2012  . Dysrhythmia    Heart skips a beat  . Elevated lipids   . GERD (gastroesophageal reflux disease)   . History of hiatal hernia   . Hypertension   . Hypothyroidism   . Leukemia ([place])   . Prostate cancer ([place])   . Skin cancer    face, scalp, behind ear,back and hand  . Tremors of nervous system     SURGICAL HISTORY: Past Surgical History:  Procedure Laterality Date  . BLADDER TUMOR EXCISION    . CARDIAC CATHETERIZATION    . CHOLECYSTECTOMY N/A 10/10/2014   Procedure: LAPAROSCOPIC CHOLECYSTECTOMY WITH INTRAOPERATIVE CHOLANGIOGRAM;  Surgeon: Leonie Green, MD;  Location: [facility] ORS;  Service: General;  Laterality: N/A;  . COLONOSCOPY WITH PROPOFOL N/A 02/16/2017   Procedure: COLONOSCOPY WITH PROPOFOL;  Surgeon: Manya Silvas, MD;  Location: [facility] ENDOSCOPY;  Service: Endoscopy;  Laterality:  N/A;  . ESOPHAGOGASTRODUODENOSCOPY (EGD) WITH PROPOFOL N/A 02/16/2017   Procedure: ESOPHAGOGASTRODUODENOSCOPY (EGD) WITH PROPOFOL;  Surgeon: Manya Silvas, MD;  Location: [facility] ENDOSCOPY;  Service: Endoscopy;  Laterality: N/A;  .  HEMORRHOID SURGERY    . HERNIA REPAIR Left    x2  . JOINT REPLACEMENT Left    Partial Knee Replacement, Dr. Roland Rack  . PARTIAL KNEE ARTHROPLASTY Left 12/12/2014   Procedure: UNICOMPARTMENTAL KNEE;  Surgeon: Corky Mull, MD;  Location: [facility] ORS;  Service: Orthopedics;  Laterality: Left;  . prostate seeding    . SHOULDER ARTHROSCOPY Right   . SHOULDER ARTHROSCOPY WITH OPEN ROTATOR CUFF REPAIR Left 02/07/2016   Procedure: SHOULDER ARTHROSCOPY WITH OPEN ROTATOR CUFF REPAIR;  Surgeon: Corky Mull, MD;  Location: [facility] ORS;  Service: Orthopedics;  Laterality: Left;    SOCIAL HISTORY: Social History   Socioeconomic History  . Marital status: Married    Spouse name: Not on file  . Number of children: Not on file  . Years of education: Not on file  . Highest education level: Not on file  Occupational History  . Not on file  Tobacco Use  . Smoking status: Never Smoker  . Smokeless tobacco: Never Used  Substance and Sexual Activity  . Alcohol use: No  . Drug use: No  . Sexual activity: Yes  Other Topics Concern  . Not on file  Social History Narrative  . Not on file   Social Determinants of Health   Financial Resource Strain:   . Difficulty of Paying Living Expenses: Not on file  Food Insecurity:   . Worried About Charity fundraiser in the Last Year: Not on file  . Ran Out of Food in the Last Year: Not on file  Transportation Needs:   . Lack of Transportation (Medical): Not on file  . Lack of Transportation (Non-Medical): Not on file  Physical Activity:   . Days of Exercise per Week: Not on file  . Minutes of Exercise per Session: Not on file  Stress:   . Feeling of Stress : Not on file  Social Connections:   . Frequency of Communication with Friends and Family: Not on file  . Frequency of Social Gatherings with Friends and Family: Not on file  . Attends Religious Services: Not on file  . Active Member of Clubs or Organizations: Not on file  . Attends Archivist  Meetings: Not on file  . Marital Status: Not on file  Intimate Partner Violence:   . Fear of Current or Ex-Partner: Not on file  . Emotionally Abused: Not on file  . Physically Abused: Not on file  . Sexually Abused: Not on file    FAMILY HISTORY: Family History  Problem Relation Age of Onset  . Bone cancer Father   . Hypertension Mother   . Osteoporosis Mother   . Breast cancer Sister   . Cancer Brother   . Cancer Brother   . Lung cancer Brother   . Bladder Cancer Brother   . Melanoma Brother     ALLERGIES:  is allergic to oxycodone-acetaminophen; percocet [oxycodone-acetaminophen]; and voltaren [diclofenac sodium].  MEDICATIONS:  Current Outpatient Medications  Medication Sig Dispense Refill  . aspirin 81 MG tablet Take 81 mg by mouth daily. In am    . b complex vitamins capsule Take 1 capsule by mouth daily.    Marland Kitchen CALQUENCE 100 MG capsule TAKE 1 CAPSULE BY MOUTH EVERY 12 HOURS 60 capsule 3  . docusate sodium (  COLACE) 100 MG capsule Take 200 mg by mouth daily.     . ferrous sulfate 324 MG TBEC Take 324 mg by mouth daily with breakfast.     . Flaxseed, Linseed, (FLAX SEED OIL) 1000 MG CAPS Take 1 capsule by mouth daily.    Marland Kitchen gabapentin (NEURONTIN) 100 MG capsule Take 100 mg by mouth 2 (two) times daily.    . Garlic 1610 MG CAPS Take 1 capsule by mouth every morning.    . latanoprost (XALATAN) 0.005 % ophthalmic solution Place 1 drop into both eyes at bedtime.    Marland Kitchen levothyroxine (SYNTHROID, LEVOTHROID) 25 MCG tablet Take 25 mcg by mouth daily at 12 noon.    Marland Kitchen lisinopril (PRINIVIL,ZESTRIL) 20 MG tablet Take 10 mg by mouth daily. In am    . loratadine (CLARITIN REDITABS) 10 MG dissolvable tablet Take 10 mg by mouth daily. In am    . Misc Natural Products (BLACK CHERRY CONCENTRATE) LIQD Take 15 mLs by mouth daily.     . Multiple Vitamins-Minerals (MULTIVITAMIN WITH MINERALS) tablet Take 1 tablet by mouth daily. In am    . Omega-3 Fatty Acids (FISH OIL) 1000 MG CAPS Take 1  capsule by mouth daily.    . polyethylene glycol (MIRALAX / GLYCOLAX) packet Take 17 g by mouth daily.    Marland Kitchen triamcinolone lotion (KENALOG) 0.1 % Apply 1 application topically once.    . dicyclomine (BENTYL) 10 MG capsule Take 10 mg by mouth 4 (four) times daily -  before meals and at bedtime.    Marland Kitchen omeprazole (PRILOSEC) 20 MG capsule Take 20 mg by mouth daily.     . primidone (MYSOLINE) 50 MG tablet Take 50 mg by mouth 2 (two) times daily.    . traMADol (ULTRAM) 50 MG tablet Take 50 mg by mouth every 6 (six) hours as needed.      No current facility-administered medications for this visit.      Marland Kitchen  PHYSICAL EXAMINATION: ECOG PERFORMANCE STATUS: 0 - Asymptomatic Vitals:   03/01/19 1410  BP: 127/74  Pulse: 60  Resp: 18  Temp: 98.2 F (36.8 C)   Filed Weights   03/01/19 1410  Weight: 173 lb 1.6 oz (78.5 kg)   Physical Exam  Constitutional: He is oriented to person, place, and time. No distress.  Walk with a cane  HENT:  Head: Normocephalic and atraumatic.  Nose: Nose normal.  Mouth/Throat: Oropharynx is clear and moist. No oropharyngeal exudate.  Eyes: Pupils are equal, round, and reactive to light. EOM are normal. No scleral icterus.  Cardiovascular: Normal rate and regular rhythm.  No murmur heard. Pulmonary/Chest: Effort normal. No respiratory distress. He has no rales. He exhibits no tenderness.  Abdominal: Soft. He exhibits no distension. There is no abdominal tenderness.  Musculoskeletal:        General: No edema. Normal range of motion.     Cervical back: Normal range of motion and neck supple.  Neurological: He is alert and oriented to person, place, and time. No cranial nerve deficit. He exhibits normal muscle tone. Coordination normal.  Skin: Skin is warm and dry. He is not diaphoretic. No erythema.  Psychiatric: Affect normal.  .    LABORATORY DATA:  I have reviewed the data as listed Lab Results  Component Value Date   WBC 8.5 03/01/2019   HGB 11.8 (L)  03/01/2019   HCT 37.1 (L) 03/01/2019   MCV 106.6 (H) 03/01/2019   PLT 109 (L) 03/01/2019   Recent Labs  11/18/18 0950 01/18/19 1312 03/01/19 1344  NA 138 139 140  K 4.3 4.4 4.6  CL 104 108 107  CO2 '25 24 26  ' GLUCOSE 105* 107* 105*  BUN 39* 37* 30*  CREATININE 1.35* 1.30* 1.45*  CALCIUM 9.7 9.3 9.4  GFRNONAA 48* 50* 44*  GFRAA 55* 58* 51*  PROT 6.5 6.7 6.7  ALBUMIN 4.0 3.8 3.8  AST '17 18 19  ' ALT '14 16 16  ' ALKPHOS 66 50 56  BILITOT 0.6 0.5 0.6    RADIOGRAPHIC STUDIES: I have personally reviewed the radiological images as listed and agreed with the findings in the report. no recent images. 11/04/2017  US abdomen Stable right renal cyst. Status post cholecystectomy.Previously seen lesion within the liver is not well appreciated on this exam. No Splenomegaly.   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 Results for CCND1/IGH, ATM, 13q and TP53 were normal.    CLL patients with trisomy 12 as the sole anomaly have an intermediate prognosis. However, recent studies have  shown that trisomy 12 in conjunction with a NOTCH1 mutation, present in about 20% of patients, represents a distinct  subgroup with poor overall survival (see references below). NOTCH1 mutation analysis is not presently available at  [facility].     ASSESSMENT & PLAN:  1. Macrocytic anemia   2. CLL (chronic lymphocytic leukemia) ([place])   3. Thrombocytopenia ([place])   4. Encounter for antineoplastic chemotherapy   5. Stage 3a chronic kidney disease    # CLL, intermediate risk with trisomy 12. IGVH unmutated. Stage III, extensive bone marrow involvement. Patient is currently on acalabrutinib 100 mg twice daily and he tolerates well. Labs  reviewed and discussed with patient. Macrocytic anemia has improved, though still persistently macrocytic.  Check B12 and folate at the next visit. Leukocytosis has continued to trend down. Continue current regimen.  Thrombocytopenia is slightly worsened.  Continue to monitor..  # CKD, Stage 3.  Stable creatinine level.  Continue to monitor. Avoid NSAIDs   Orders Placed This Encounter  Procedures  . Vitamin B12    Standing Status:   Future    Standing Expiration Date:   02/29/2020  . Folate    Standing Status:   Future    Standing Expiration Date:   02/29/2020  . CBC with Differential    Standing Status:   Future    Standing Expiration Date:   02/29/2020  . Comprehensive metabolic panel    Standing Status:   Future    Standing Expiration Date:   02/29/2020   Follow up in 8 weeks  [name], MD, PhD Hematology Oncology [place] at [facility] 03/01/2019

## 2019-03-01 NOTE — Progress Notes (Signed)
Patient here for follow up. No concerns voiced.  °

## 2019-03-24 MED FILL — CALQUENCE 100 MG CAPSULE: 100 | 30 days supply | Qty: 60 | Fill #2

## 2019-04-05 ENCOUNTER — Other Ambulatory Visit: Payer: Self-pay | Admitting: Student

## 2019-04-05 DIAGNOSIS — R6881 Early satiety: Secondary | ICD-10-CM

## 2019-04-05 DIAGNOSIS — R1310 Dysphagia, unspecified: Secondary | ICD-10-CM

## 2019-04-05 DIAGNOSIS — R1319 Other dysphagia: Secondary | ICD-10-CM

## 2019-04-13 ENCOUNTER — Other Ambulatory Visit: Payer: Self-pay

## 2019-04-13 ENCOUNTER — Ambulatory Visit
Admission: RE | Admit: 2019-04-13 | Discharge: 2019-04-13 | Disposition: A | Discharge status: Home / Self Care | Payer: Medicare Other | Source: Ambulatory Visit | Attending: Student | Admitting: Student

## 2019-04-13 DIAGNOSIS — R1310 Dysphagia, unspecified: Secondary | ICD-10-CM | POA: Diagnosis not present

## 2019-04-13 DIAGNOSIS — R6881 Early satiety: Secondary | ICD-10-CM | POA: Diagnosis present

## 2019-04-13 DIAGNOSIS — R1319 Other dysphagia: Secondary | ICD-10-CM

## 2019-04-26 ENCOUNTER — Inpatient Hospital Stay: Payer: Medicare Other | Attending: Oncology

## 2019-04-26 ENCOUNTER — Other Ambulatory Visit: Payer: Self-pay

## 2019-04-26 ENCOUNTER — Inpatient Hospital Stay (HOSPITAL_BASED_OUTPATIENT_CLINIC_OR_DEPARTMENT_OTHER): Payer: Medicare Other | Admitting: Oncology

## 2019-04-26 ENCOUNTER — Encounter: Payer: Self-pay | Admitting: Oncology

## 2019-04-26 VITALS — BP 128/71 | HR 58 | Temp 95.6°F | Resp 18 | Wt 169.6 lb

## 2019-04-26 DIAGNOSIS — K219 Gastro-esophageal reflux disease without esophagitis: Secondary | ICD-10-CM

## 2019-04-26 DIAGNOSIS — Z79899 Other long term (current) drug therapy: Secondary | ICD-10-CM | POA: Insufficient documentation

## 2019-04-26 DIAGNOSIS — I129 Hypertensive chronic kidney disease with stage 1 through stage 4 chronic kidney disease, or unspecified chronic kidney disease: Secondary | ICD-10-CM | POA: Diagnosis not present

## 2019-04-26 DIAGNOSIS — C9110 Chronic lymphocytic leukemia of B-cell type not having achieved remission: Secondary | ICD-10-CM | POA: Insufficient documentation

## 2019-04-26 DIAGNOSIS — D696 Thrombocytopenia, unspecified: Secondary | ICD-10-CM | POA: Diagnosis not present

## 2019-04-26 DIAGNOSIS — D7589 Other specified diseases of blood and blood-forming organs: Secondary | ICD-10-CM | POA: Insufficient documentation

## 2019-04-26 DIAGNOSIS — N1831 Chronic kidney disease, stage 3a: Secondary | ICD-10-CM

## 2019-04-26 DIAGNOSIS — Z5111 Encounter for antineoplastic chemotherapy: Secondary | ICD-10-CM

## 2019-04-26 DIAGNOSIS — D539 Nutritional anemia, unspecified: Secondary | ICD-10-CM | POA: Insufficient documentation

## 2019-04-26 LAB — COMPREHENSIVE METABOLIC PANEL
ALT: 16 U/L (ref 0–44)
AST: 19 U/L (ref 15–41)
Albumin: 3.9 g/dL (ref 3.5–5.0)
Alkaline Phosphatase: 59 U/L (ref 38–126)
Anion gap: 8 (ref 5–15)
BUN: 33 mg/dL — ABNORMAL HIGH (ref 8–23)
CO2: 25 mmol/L (ref 22–32)
Calcium: 9.8 mg/dL (ref 8.9–10.3)
Chloride: 105 mmol/L (ref 98–111)
Creatinine, Ser: 1.36 mg/dL — ABNORMAL HIGH (ref 0.61–1.24)
GFR calc Af Amer: 55 mL/min — ABNORMAL LOW (ref >60)
GFR calc non Af Amer: 47 mL/min — ABNORMAL LOW (ref >60)
Glucose, Bld: 98 mg/dL (ref 70–99)
Potassium: 4.4 mmol/L (ref 3.5–5.1)
Sodium: 138 mmol/L (ref 135–145)
Total Bilirubin: 0.6 mg/dL (ref 0.3–1.2)
Total Protein: 7 g/dL (ref 6.5–8.1)

## 2019-04-26 LAB — CBC WITH DIFFERENTIAL/PLATELET
Abs Immature Granulocytes: 0.02 10*3/uL (ref 0.00–0.07)
Basophils Absolute: 0 10*3/uL (ref 0.0–0.1)
Basophils Relative: 0 %
Eosinophils Absolute: 0.1 10*3/uL (ref 0.0–0.5)
Eosinophils Relative: 2 %
HCT: 38.9 % — ABNORMAL LOW (ref 39.0–52.0)
Hemoglobin: 12.7 g/dL — ABNORMAL LOW (ref 13.0–17.0)
Immature Granulocytes: 0 %
Lymphocytes Relative: 39 %
Lymphs Abs: 2.7 10*3/uL (ref 0.7–4.0)
MCH: 33.4 pg (ref 26.0–34.0)
MCHC: 32.6 g/dL (ref 30.0–36.0)
MCV: 102.4 fL — ABNORMAL HIGH (ref 80.0–100.0)
Monocytes Absolute: 0.6 10*3/uL (ref 0.1–1.0)
Monocytes Relative: 9 %
Neutro Abs: 3.4 10*3/uL (ref 1.7–7.7)
Neutrophils Relative %: 50 %
Platelets: 121 10*3/uL — ABNORMAL LOW (ref 150–400)
RBC: 3.8 MIL/uL — ABNORMAL LOW (ref 4.22–5.81)
RDW: 13 % (ref 11.5–15.5)
WBC: 6.8 10*3/uL (ref 4.0–10.5)
nRBC: 0 % (ref 0.0–0.2)

## 2019-04-26 LAB — VITAMIN B12: Vitamin B-12: 527 pg/mL (ref 180–914)

## 2019-04-26 LAB — FOLATE: Folate: 62.4 ng/mL (ref >5.9)

## 2019-04-26 MED FILL — CALQUENCE 100 MG CAPSULE: 100 | 30 days supply | Qty: 60 | Fill #3

## 2019-04-26 NOTE — Progress Notes (Signed)
Hematology/Oncology Follow Up Note [facility] Telephone:([phone] [phone]  CONSULT NOTE Patient Care Team: Tracie Harrier, MD as PCP - General (Internal Medicine)  REASON FOR VISIT  follow up for CLL and iron deficiency anemia  HISTORY OF PRESENTING ILLNESS:  Allen Chang 86 y.o.  male with PMH listed as below who was referred by primary care provider to me for evaluation of leukocytosis. He has a history of prostate cancer which was diagnosed 10 years ago. He underwent brachytherapy. He is not any castration treatment. Per patient, he follows up with Dr.Wolf and most recent PSA is normal.   Patient also reports feeling fatigue lately. Recent labs show leukocytosis, mild anemia, macrocytosis, mild thrombocytopenia, low ferritin. Denies blood in the stool. He was started on over the counter iron supplement but wife who manages patient's medication decides to only let patient to take iron medication every other day.   # received IV iron Venofer x 4. Colonoscopy was done on 02/16/2017 which showed polyps, internal hemorrhoids, negative for malignancy.  ##Patient had bone marrow biopsy on 09/22/2018 Pathology showed hypercellular bone marrow with extensive involvement by chronic lymphocytic leukemia. # Started on acalabrutinib 100 mg twice daily on 10/01/2018. INTERVAL HISTORY Allen Chang is a 86 y.o. male who has above history reviewed by me presents for 4 months follow up for CLL and iron deficiency anemia, macrocytic anemia.  Patient reports feeling well. He was recently seen by gastroenterology for dysphagia.  X-ray barium swallow showed esophageal spasms in the lower half of the esophagus, mild GERD and a large hiatal hernia.  Patient has been on Protonix 40 mg daily for about 1 months.  GI recommended to increase Protonix to 40 mg twice daily.    Patient has been taking  acalabrutinib 100 mg twice and tolerates well. Patient states that he has  difficulties refilling his Protonix and propanolol.  Review of Systems  Constitutional: Negative for appetite change, chills, fatigue, fever and unexpected weight change.  HENT:   Negative for hearing loss and voice change.   Eyes: Negative for eye problems and icterus.  Respiratory: Negative for chest tightness, cough and shortness of breath.   Cardiovascular: Negative for chest pain and leg swelling.  Gastrointestinal: Negative for abdominal distention and abdominal pain.  Endocrine: Negative for hot flashes.  Genitourinary: Negative for difficulty urinating, dysuria and frequency.   Musculoskeletal: Positive for arthralgias. Negative for back pain.  Skin: Negative for itching and rash.  Neurological: Negative for light-headedness and numbness.  Hematological: Negative for adenopathy. Does not bruise/bleed easily.  Psychiatric/Behavioral: Negative for confusion.    MEDICAL HISTORY:  Past Medical History:  Diagnosis Date  . Cancer [facility])    prostate   . Complication of anesthesia    bradycardia, in ICU for 24 hour after galbladder surgery.   . Diverticulosis 2012  . Dysrhythmia    Heart skips a beat  . Elevated lipids   . GERD (gastroesophageal reflux disease)   . History of hiatal hernia   . Hypertension   . Hypothyroidism   . Leukemia ([place])   . Prostate cancer ([place])   . Skin cancer    face, scalp, behind ear,back and hand  . Tremors of nervous system     SURGICAL HISTORY: Past Surgical History:  Procedure Laterality Date  . BLADDER TUMOR EXCISION    . CARDIAC CATHETERIZATION    . CHOLECYSTECTOMY N/A 10/10/2014   Procedure: LAPAROSCOPIC CHOLECYSTECTOMY WITH INTRAOPERATIVE CHOLANGIOGRAM;  Surgeon: Leonie Green, MD;  Location: [facility] ORS;  Service: General;  Laterality: N/A;  . COLONOSCOPY WITH PROPOFOL N/A 02/16/2017   Procedure: COLONOSCOPY WITH PROPOFOL;  Surgeon: Manya Silvas, MD;  Location: [facility] ENDOSCOPY;  Service: Endoscopy;  Laterality: N/A;  .  ESOPHAGOGASTRODUODENOSCOPY (EGD) WITH PROPOFOL N/A 02/16/2017   Procedure: ESOPHAGOGASTRODUODENOSCOPY (EGD) WITH PROPOFOL;  Surgeon: Manya Silvas, MD;  Location[facility] ENDOSCOPY;  Service: Endoscopy;  Laterality: N/A;  . HEMORRHOID SURGERY    . HERNIA REPAIR Left    x2  . JOINT REPLACEMENT Left    Partial Knee Replacement, Dr. Roland Rack  . PARTIAL KNEE ARTHROPLASTY Left 12/12/2014   Procedure: UNICOMPARTMENTAL KNEE;  Surgeon: Corky Mull, MD;  Location: [facility] ORS;  Service: Orthopedics;  Laterality: Left;  . prostate seeding    . SHOULDER ARTHROSCOPY Right   . SHOULDER ARTHROSCOPY WITH OPEN ROTATOR CUFF REPAIR Left 02/07/2016   Procedure: SHOULDER ARTHROSCOPY WITH OPEN ROTATOR CUFF REPAIR;  Surgeon: Corky Mull, MD;  Location: [facility] ORS;  Service: Orthopedics;  Laterality: Left;    SOCIAL HISTORY: Social History   Socioeconomic History  . Marital status: Married    Spouse name: Not on file  . Number of children: Not on file  . Years of education: Not on file  . Highest education level: Not on file  Occupational History  . Not on file  Tobacco Use  . Smoking status: Never Smoker  . Smokeless tobacco: Never Used  Substance and Sexual Activity  . Alcohol use: No  . Drug use: No  . Sexual activity: Yes  Other Topics Concern  . Not on file  Social History Narrative  . Not on file   Social Determinants of Health   Financial Resource Strain:   . Difficulty of Paying Living Expenses: Not on file  Food Insecurity:   . Worried About Charity fundraiser in the Last Year: Not on file  . Ran Out of Food in the Last Year: Not on file  Transportation Needs:   . Lack of Transportation (Medical): Not on file  . Lack of Transportation (Non-Medical): Not on file  Physical Activity:   . Days of Exercise per Week: Not on file  . Minutes of Exercise per Session: Not on file  Stress:   . Feeling of Stress : Not on file  Social Connections:   . Frequency of Communication with Friends  and Family: Not on file  . Frequency of Social Gatherings with Friends and Family: Not on file  . Attends Religious Services: Not on file  . Active Member of Clubs or Organizations: Not on file  . Attends Archivist Meetings: Not on file  . Marital Status: Not on file  Intimate Partner Violence:   . Fear of Current or Ex-Partner: Not on file  . Emotionally Abused: Not on file  . Physically Abused: Not on file  . Sexually Abused: Not on file    FAMILY HISTORY: Family History  Problem Relation Age of Onset  . Bone cancer Father   . Hypertension Mother   . Osteoporosis Mother   . Breast cancer Sister   . Cancer Brother   . Cancer Brother   . Lung cancer Brother   . Bladder Cancer Brother   . Melanoma Brother     ALLERGIES:  is allergic to oxycodone-acetaminophen; percocet [oxycodone-acetaminophen]; and voltaren [diclofenac sodium].  MEDICATIONS:  Current Outpatient Medications  Medication Sig Dispense Refill  . aspirin 81 MG tablet Take 81 mg by mouth daily. In am    .  b complex vitamins capsule Take 1 capsule by mouth daily.    Marland Kitchen CALQUENCE 100 MG capsule TAKE 1 CAPSULE BY MOUTH EVERY 12 HOURS 60 capsule 3  . docusate sodium (COLACE) 100 MG capsule Take 200 mg by mouth daily.     . ferrous sulfate 324 MG TBEC Take 324 mg by mouth daily with breakfast.     . Flaxseed, Linseed, (FLAX SEED OIL) 1000 MG CAPS Take 1 capsule by mouth daily.    Marland Kitchen gabapentin (NEURONTIN) 100 MG capsule Take 100 mg by mouth 2 (two) times daily.    . Garlic 4656 MG CAPS Take 1 capsule by mouth every morning.    . latanoprost (XALATAN) 0.005 % ophthalmic solution Place 1 drop into both eyes at bedtime.    Marland Kitchen levothyroxine (SYNTHROID, LEVOTHROID) 25 MCG tablet Take 25 mcg by mouth daily at 12 noon.    Marland Kitchen lisinopril (PRINIVIL,ZESTRIL) 20 MG tablet Take 10 mg by mouth daily. In am    . loratadine (CLARITIN REDITABS) 10 MG dissolvable tablet Take 10 mg by mouth daily. In am    . Misc Natural Products  (BLACK CHERRY CONCENTRATE) LIQD Take 15 mLs by mouth daily.     . Multiple Vitamins-Minerals (MULTIVITAMIN WITH MINERALS) tablet Take 1 tablet by mouth daily. In am    . Omega-3 Fatty Acids (FISH OIL) 1000 MG CAPS Take 1 capsule by mouth daily.    . polyethylene glycol (MIRALAX / GLYCOLAX) packet Take 17 g by mouth daily.    . traMADol (ULTRAM) 50 MG tablet Take 50 mg by mouth every 6 (six) hours as needed.     . triamcinolone lotion (KENALOG) 0.1 % Apply 1 application topically once.    . dicyclomine (BENTYL) 10 MG capsule Take 10 mg by mouth 4 (four) times daily -  before meals and at bedtime.    Marland Kitchen omeprazole (PRILOSEC) 20 MG capsule Take 20 mg by mouth daily.     . primidone (MYSOLINE) 50 MG tablet Take 50 mg by mouth 2 (two) times daily.     No current facility-administered medications for this visit.      Marland Kitchen  PHYSICAL EXAMINATION: ECOG PERFORMANCE STATUS: 0 - Asymptomatic Vitals:   04/26/19 1305  BP: 128/71  Pulse: (!) 58  Resp: 18  Temp: (!) 95.6 F (35.3 C)   Filed Weights   04/26/19 1305  Weight: 169 lb 9.6 oz (76.9 kg)   Physical Exam  Constitutional: He is oriented to person, place, and time. No distress.  Patient walks independently  HENT:  Head: Normocephalic and atraumatic.  Nose: Nose normal.  Mouth/Throat: Oropharynx is clear and moist. No oropharyngeal exudate.  Eyes: Pupils are equal, round, and reactive to light. EOM are normal. No scleral icterus.  Cardiovascular: Normal rate and regular rhythm.  No murmur heard. Pulmonary/Chest: Effort normal. No respiratory distress. He has no rales. He exhibits no tenderness.  Abdominal: Soft. He exhibits no distension. There is no abdominal tenderness.  Musculoskeletal:        General: No edema. Normal range of motion.     Cervical back: Normal range of motion and neck supple.  Neurological: He is alert and oriented to person, place, and time. No cranial nerve deficit. He exhibits normal muscle tone. Coordination  normal.  Skin: Skin is warm and dry. He is not diaphoretic. No erythema.  Psychiatric: Affect normal.  .    LABORATORY DATA:  I have reviewed the data as listed Lab Results  Component Value Date  WBC 6.8 04/26/2019   HGB 12.7 (L) 04/26/2019   HCT 38.9 (L) 04/26/2019   MCV 102.4 (H) 04/26/2019   PLT 121 (L) 04/26/2019   Recent Labs    01/18/19 1312 03/01/19 1344 04/26/19 1245  NA 139 140 138  K 4.4 4.6 4.4  CL 108 107 105  CO2 _0 GLUCOSE 107* 105* 98  BUN 37* 30* 33*  CREATININE 1.30* 1.45* 1.36*  CALCIUM 9.3 9.4 9.8  GFRNONAA 50* 44* 47*  GFRAA 58* 51* 55*  PROT 6.7 6.7 7.0  ALBUMIN 3.8 3.8 3.9  AST _1 ALT _2 ALKPHOS 50 56 59  BILITOT 0.5 0.6 0.6    RADIOGRAPHIC STUDIES: I have personally reviewed the radiological images as listed and agreed with the findings in the report. no recent images. 11/04/2017  US abdomen Stable right renal cyst. Status post cholecystectomy.Previously seen lesion within the liver is not well appreciated on this exam. No Splenomegaly.   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  Cytogenetics revealed Trisomy 12 IGVH unmutated.  ASSESSMENT & PLAN:  1. Macrocytic anemia   2. CLL (chronic lymphocytic leukemia) ([place])   3. Encounter for antineoplastic chemotherapy   4. Stage 3a chronic kidney disease   5. Gastroesophageal reflux disease, unspecified whether esophagitis present   6. Thrombocytopenia ([place])    # CLL, intermediate risk with trisomy 12. IGVH unmutated. Stage III, extensive bone marrow involvement. Patient is currently on acalabrutinib 100 mg twice daily and he tolerates well. Labs reviewed and discussed with patient. Hemoglobin continues to improve to 12.7.  Chronic macrocytosis, MCV improved. Leukocytosis has completely resolved and total white count normalized. Patient is also clinically doing well with no constitutional symptoms. For now continue current regimen.  #GERD/hiatal  hernia, patient has been on Protonix 40 mg daily. Discussed with cancer pharmacist.  There is a category X interaction between Protonix and acalabrutinib.  Protonix may decrease acalabrutinib efficacy. I [facility] and discussed with Nature conservation officer.  Recommend to try calcium carbonate and famotidine.  Patient currently has had a good response to acalabrutinib.  If his symptoms are not controlled with famotidine, short course of Protonix can be considered with close monitoring of his CLL disease status.   Thrombocytopenia, count is stable.  Platelet 1 21,000.  Continue to monitor. # CKD, Stage 3.  Stable creatinine level.  Continue to monitor. Avoid NSAIDs - no drug interaction between acalabrutinib and propanolol.   Orders Placed This Encounter  Procedures  . CBC with Differential/Platelet    Standing Status:   Future    Standing Expiration Date:   04/25/2020  . Comprehensive metabolic panel    Standing Status:   Future    Standing Expiration Date:   04/25/2020   Follow up in 8 weeks  [name], MD, PhD Hematology Oncology [employer] [facility] 04/26/2019

## 2019-04-26 NOTE — Progress Notes (Signed)
Patient is taking Calquence, prescribed by Dr. Tasia Catchings.  Pharmacy will not fill Propranolol or Protonix that was prescribed by Dr. Ginette Pitman due to taking Calquence.

## 2019-05-17 ENCOUNTER — Other Ambulatory Visit: Payer: Self-pay | Admitting: Oncology

## 2019-05-17 DIAGNOSIS — C9110 Chronic lymphocytic leukemia of B-cell type not having achieved remission: Secondary | ICD-10-CM

## 2019-05-25 MED FILL — CALQUENCE 100 MG CAPSULE: 100 | 30 days supply | Qty: 60 | Fill #0

## 2019-06-23 MED FILL — CALQUENCE 100 MG CAPSULE: 100 | 30 days supply | Qty: 60 | Fill #1

## 2019-06-28 ENCOUNTER — Encounter: Payer: Self-pay | Admitting: Oncology

## 2019-06-28 ENCOUNTER — Other Ambulatory Visit: Payer: Self-pay

## 2019-06-28 ENCOUNTER — Inpatient Hospital Stay (HOSPITAL_BASED_OUTPATIENT_CLINIC_OR_DEPARTMENT_OTHER): Payer: Medicare Other | Admitting: Oncology

## 2019-06-28 ENCOUNTER — Inpatient Hospital Stay: Payer: Medicare Other | Attending: Oncology

## 2019-06-28 VITALS — BP 131/68 | HR 50 | Temp 97.1°F | Resp 18 | Wt 171.9 lb

## 2019-06-28 DIAGNOSIS — D696 Thrombocytopenia, unspecified: Secondary | ICD-10-CM | POA: Diagnosis not present

## 2019-06-28 DIAGNOSIS — N1831 Chronic kidney disease, stage 3a: Secondary | ICD-10-CM

## 2019-06-28 DIAGNOSIS — N183 Chronic kidney disease, stage 3 unspecified: Secondary | ICD-10-CM | POA: Diagnosis not present

## 2019-06-28 DIAGNOSIS — Z5111 Encounter for antineoplastic chemotherapy: Secondary | ICD-10-CM

## 2019-06-28 DIAGNOSIS — Z79899 Other long term (current) drug therapy: Secondary | ICD-10-CM | POA: Diagnosis not present

## 2019-06-28 DIAGNOSIS — C9110 Chronic lymphocytic leukemia of B-cell type not having achieved remission: Secondary | ICD-10-CM

## 2019-06-28 DIAGNOSIS — K219 Gastro-esophageal reflux disease without esophagitis: Secondary | ICD-10-CM | POA: Diagnosis not present

## 2019-06-28 DIAGNOSIS — K449 Diaphragmatic hernia without obstruction or gangrene: Secondary | ICD-10-CM | POA: Insufficient documentation

## 2019-06-28 DIAGNOSIS — I129 Hypertensive chronic kidney disease with stage 1 through stage 4 chronic kidney disease, or unspecified chronic kidney disease: Secondary | ICD-10-CM | POA: Diagnosis not present

## 2019-06-28 DIAGNOSIS — D539 Nutritional anemia, unspecified: Secondary | ICD-10-CM

## 2019-06-28 LAB — CBC WITH DIFFERENTIAL/PLATELET
Abs Immature Granulocytes: 0.05 10*3/uL (ref 0.00–0.07)
Basophils Absolute: 0 10*3/uL (ref 0.0–0.1)
Basophils Relative: 1 %
Eosinophils Absolute: 0.1 10*3/uL (ref 0.0–0.5)
Eosinophils Relative: 2 %
HCT: 37.9 % — ABNORMAL LOW (ref 39.0–52.0)
Hemoglobin: 12.6 g/dL — ABNORMAL LOW (ref 13.0–17.0)
Immature Granulocytes: 1 %
Lymphocytes Relative: 45 %
Lymphs Abs: 2.3 10*3/uL (ref 0.7–4.0)
MCH: 34.7 pg — ABNORMAL HIGH (ref 26.0–34.0)
MCHC: 33.2 g/dL (ref 30.0–36.0)
MCV: 104.4 fL — ABNORMAL HIGH (ref 80.0–100.0)
Monocytes Absolute: 0.9 10*3/uL (ref 0.1–1.0)
Monocytes Relative: 18 %
Neutro Abs: 1.6 10*3/uL — ABNORMAL LOW (ref 1.7–7.7)
Neutrophils Relative %: 33 %
Platelets: 122 10*3/uL — ABNORMAL LOW (ref 150–400)
RBC: 3.63 MIL/uL — ABNORMAL LOW (ref 4.22–5.81)
RDW: 12.8 % (ref 11.5–15.5)
WBC: 5 10*3/uL (ref 4.0–10.5)
nRBC: 0 % (ref 0.0–0.2)

## 2019-06-28 LAB — COMPREHENSIVE METABOLIC PANEL
ALT: 18 U/L (ref 0–44)
AST: 20 U/L (ref 15–41)
Albumin: 3.8 g/dL (ref 3.5–5.0)
Alkaline Phosphatase: 63 U/L (ref 38–126)
Anion gap: 7 (ref 5–15)
BUN: 28 mg/dL — ABNORMAL HIGH (ref 8–23)
CO2: 28 mmol/L (ref 22–32)
Calcium: 9.4 mg/dL (ref 8.9–10.3)
Chloride: 104 mmol/L (ref 98–111)
Creatinine, Ser: 1.45 mg/dL — ABNORMAL HIGH (ref 0.61–1.24)
GFR calc Af Amer: 51 mL/min — ABNORMAL LOW (ref >60)
GFR calc non Af Amer: 44 mL/min — ABNORMAL LOW (ref >60)
Glucose, Bld: 99 mg/dL (ref 70–99)
Potassium: 4.5 mmol/L (ref 3.5–5.1)
Sodium: 139 mmol/L (ref 135–145)
Total Bilirubin: 0.8 mg/dL (ref 0.3–1.2)
Total Protein: 7 g/dL (ref 6.5–8.1)

## 2019-06-28 NOTE — Progress Notes (Signed)
Hematology/Oncology Follow Up Note [facility] Telephone:([phone] [phone]  CONSULT NOTE Patient Care Team: Tracie Harrier, MD as PCP - General (Internal Medicine)  REASON FOR VISIT  follow up for CLL and iron deficiency anemia  HISTORY OF PRESENTING ILLNESS:  Allen Chang 86 y.o.  male with PMH listed as below who was referred by primary care provider to me for evaluation of leukocytosis. He has a history of prostate cancer which was diagnosed 10 years ago. He underwent brachytherapy. He is not any castration treatment. Per patient, he follows up with Dr.Wolf and most recent PSA is normal.   Patient also reports feeling fatigue lately. Recent labs show leukocytosis, mild anemia, macrocytosis, mild thrombocytopenia, low ferritin. Denies blood in the stool. He was started on over the counter iron supplement but wife who manages patient's medication decides to only let patient to take iron medication every other day.   # received IV iron Venofer x 4. Colonoscopy was done on 02/16/2017 which showed polyps, internal hemorrhoids, negative for malignancy.  ##Patient had bone marrow biopsy on 09/22/2018 Pathology showed hypercellular bone marrow with extensive involvement by chronic lymphocytic leukemia. # Started on acalabrutinib 100 mg twice daily on 10/01/2018. INTERVAL HISTORY Allen Chang is a 86 y.o. male who has above history reviewed by me presents for 4 months follow up for CLL and iron deficiency anemia, macrocytic anemia.  Patient is on acalabrutinib 100 mg twice daily and he tolerates well.  He denies any new complaints. Acid reflux symptoms are stable.  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  Review of Systems  Constitutional: Negative for appetite change, chills, fatigue, fever and unexpected weight change.  HENT:   Negative for hearing loss and  voice change.   Eyes: Negative for eye problems and icterus.  Respiratory: Negative for chest tightness, cough and shortness of breath.   Cardiovascular: Negative for chest pain and leg swelling.  Gastrointestinal: Negative for abdominal distention and abdominal pain.  Endocrine: Negative for hot flashes.  Genitourinary: Negative for difficulty urinating, dysuria and frequency.   Musculoskeletal: Positive for arthralgias. Negative for back pain.  Skin: Negative for itching and rash.       Skin cancer  Neurological: Negative for light-headedness and numbness.  Hematological: Negative for adenopathy. Does not bruise/bleed easily.  Psychiatric/Behavioral: Negative for confusion.    MEDICAL HISTORY:  Past Medical History:  Diagnosis Date  . Cancer [facility])    prostate   . Complication of anesthesia    bradycardia, in ICU for 24 hour after galbladder surgery.   . Diverticulosis 2012  . Dysrhythmia    Heart skips a beat  . Elevated lipids   . GERD (gastroesophageal reflux disease)   . History of hiatal hernia   . Hypertension   . Hypothyroidism   . Leukemia ([place])   . Prostate cancer ([place])   . Skin cancer    face, scalp, behind ear,back and hand  . Tremors of nervous system     SURGICAL HISTORY: Past Surgical History:  Procedure Laterality Date  . BLADDER TUMOR EXCISION    . CARDIAC CATHETERIZATION    . CHOLECYSTECTOMY N/A 10/10/2014   Procedure: LAPAROSCOPIC CHOLECYSTECTOMY WITH INTRAOPERATIVE CHOLANGIOGRAM;  Surgeon: Leonie Green, MD;  Location: [facility] ORS;  Service: General;  Laterality: N/A;  . COLONOSCOPY WITH PROPOFOL N/A 02/16/2017   Procedure: COLONOSCOPY WITH PROPOFOL;  Surgeon: Manya Silvas,  MD;  Location: [facility] ENDOSCOPY;  Service: Endoscopy;  Laterality: N/A;  . ESOPHAGOGASTRODUODENOSCOPY (EGD) WITH PROPOFOL N/A 02/16/2017   Procedure: ESOPHAGOGASTRODUODENOSCOPY (EGD) WITH PROPOFOL;  Surgeon: Manya Silvas, MD;  Location: [facility] ENDOSCOPY;  Service:  Endoscopy;  Laterality: N/A;  . HEMORRHOID SURGERY    . HERNIA REPAIR Left    x2  . JOINT REPLACEMENT Left    Partial Knee Replacement, Dr. Roland Rack  . PARTIAL KNEE ARTHROPLASTY Left 12/12/2014   Procedure: UNICOMPARTMENTAL KNEE;  Surgeon: Corky Mull, MD;  Location: [facility] ORS;  Service: Orthopedics;  Laterality: Left;  . prostate seeding    . SHOULDER ARTHROSCOPY Right   . SHOULDER ARTHROSCOPY WITH OPEN ROTATOR CUFF REPAIR Left 02/07/2016   Procedure: SHOULDER ARTHROSCOPY WITH OPEN ROTATOR CUFF REPAIR;  Surgeon: Corky Mull, MD;  Location: [facility] ORS;  Service: Orthopedics;  Laterality: Left;    SOCIAL HISTORY: Social History   Socioeconomic History  . Marital status: Married    Spouse name: Not on file  . Number of children: Not on file  . Years of education: Not on file  . Highest education level: Not on file  Occupational History  . Not on file  Tobacco Use  . Smoking status: Never Smoker  . Smokeless tobacco: Never Used  Substance and Sexual Activity  . Alcohol use: No  . Drug use: No  . Sexual activity: Yes  Other Topics Concern  . Not on file  Social History Narrative  . Not on file   Social Determinants of Health   Financial Resource Strain:   . Difficulty of Paying Living Expenses:   Food Insecurity:   . Worried About Charity fundraiser in the Last Year:   . Arboriculturist in the Last Year:   Transportation Needs:   . Film/video editor (Medical):   Marland Kitchen Lack of Transportation (Non-Medical):   Physical Activity:   . Days of Exercise per Week:   . Minutes of Exercise per Session:   Stress:   . Feeling of Stress :   Social Connections:   . Frequency of Communication with Friends and Family:   . Frequency of Social Gatherings with Friends and Family:   . Attends Religious Services:   . Active Member of Clubs or Organizations:   . Attends Archivist Meetings:   Marland Kitchen Marital Status:   Intimate Partner Violence:   . Fear of Current or Ex-Partner:    . Emotionally Abused:   Marland Kitchen Physically Abused:   . Sexually Abused:     FAMILY HISTORY: Family History  Problem Relation Age of Onset  . Bone cancer Father   . Hypertension Mother   . Osteoporosis Mother   . Breast cancer Sister   . Cancer Brother   . Cancer Brother   . Lung cancer Brother   . Bladder Cancer Brother   . Melanoma Brother     ALLERGIES:  is allergic to oxycodone-acetaminophen; percocet [oxycodone-acetaminophen]; and voltaren [diclofenac sodium].  MEDICATIONS:  Current Outpatient Medications  Medication Sig Dispense Refill  . Apoaequorin (PREVAGEN PO) Take 1 tablet by mouth daily.    Marland Kitchen ascorbic acid (VITAMIN C) 1000 MG tablet Take by mouth.    Marland Kitchen aspirin 81 MG tablet Take 81 mg by mouth daily. In am    . b complex vitamins capsule Take 1 capsule by mouth daily.    Marland Kitchen CALQUENCE 100 MG capsule TAKE 1 CAPSULE BY MOUTH EVERY 12 HOURS 60 capsule 3  . Cholecalciferol  25 MCG (1000 UT) tablet Take by mouth.    . dicyclomine (BENTYL) 10 MG capsule Take 10 mg by mouth 4 (four) times daily -  before meals and at bedtime.    . DULoxetine (CYMBALTA) 20 MG capsule Take 20 mg by mouth daily.    . famotidine (PEPCID) 40 MG tablet Take 40 mg by mouth 2 (two) times daily.    . ferrous sulfate 324 MG TBEC Take 324 mg by mouth daily with breakfast.     . Flaxseed, Linseed, (FLAX SEED OIL) 1000 MG CAPS Take 1 capsule by mouth daily.    Marland Kitchen gabapentin (NEURONTIN) 100 MG capsule Take 100 mg by mouth 2 (two) times daily.    . Garlic 0177 MG CAPS Take 1 capsule by mouth every morning.    . latanoprost (XALATAN) 0.005 % ophthalmic solution Place 1 drop into both eyes at bedtime.    Marland Kitchen levothyroxine (SYNTHROID, LEVOTHROID) 25 MCG tablet Take 25 mcg by mouth daily at 12 noon.    Marland Kitchen lisinopril (PRINIVIL,ZESTRIL) 20 MG tablet Take 10 mg by mouth daily. In am    . loratadine (CLARITIN REDITABS) 10 MG dissolvable tablet Take 10 mg by mouth daily. In am    . Misc Natural Products (BLACK CHERRY  CONCENTRATE) LIQD Take 15 mLs by mouth daily.     . Multiple Vitamins-Minerals (MULTIVITAMIN WITH MINERALS) tablet Take 1 tablet by mouth daily. In am    . Omega-3 Fatty Acids (FISH OIL) 1000 MG CAPS Take 1 capsule by mouth daily.    . propranolol (INDERAL) 10 MG tablet Take 10 mg by mouth 2 (two) times daily.    . traMADol (ULTRAM) 50 MG tablet Take 50 mg by mouth every 6 (six) hours as needed.     . docusate sodium (COLACE) 100 MG capsule Take 200 mg by mouth daily.     Marland Kitchen omeprazole (PRILOSEC) 20 MG capsule Take 20 mg by mouth daily.     . polyethylene glycol (MIRALAX / GLYCOLAX) packet Take 17 g by mouth daily.    . primidone (MYSOLINE) 50 MG tablet Take 50 mg by mouth 2 (two) times daily.    Marland Kitchen triamcinolone lotion (KENALOG) 0.1 % Apply 1 application topically once.     No current facility-administered medications for this visit.      Marland Kitchen  PHYSICAL EXAMINATION: ECOG PERFORMANCE STATUS: 0 - Asymptomatic Vitals:   06/28/19 1309  BP: 131/68  Pulse: (!) 50  Resp: 18  Temp: (!) 97.1 F (36.2 C)   Filed Weights   06/28/19 1309  Weight: 171 lb 14.4 oz (78 kg)   Physical Exam  Constitutional: He is oriented to person, place, and time. No distress.  Patient walks independently  HENT:  Head: Normocephalic and atraumatic.  Nose: Nose normal.  Mouth/Throat: Oropharynx is clear and moist. No oropharyngeal exudate.  Eyes: Pupils are equal, round, and reactive to light. EOM are normal. No scleral icterus.  Cardiovascular: Normal rate and regular rhythm.  No murmur heard. Pulmonary/Chest: Effort normal. No respiratory distress. He has no rales. He exhibits no tenderness.  Abdominal: Soft. He exhibits no distension. There is no abdominal tenderness.  Musculoskeletal:        General: No edema. Normal range of motion.     Cervical back: Normal range of motion and neck supple.  Neurological: He is alert and oriented to person, place, and time.  Skin: Skin is warm and dry. He is not  diaphoretic. No erythema.  Psychiatric: Affect normal.  .  LABORATORY DATA:  I have reviewed the data as listed Lab Results  Component Value Date   WBC 5.0 06/28/2019   HGB 12.6 (L) 06/28/2019   HCT 37.9 (L) 06/28/2019   MCV 104.4 (H) 06/28/2019   PLT 122 (L) 06/28/2019   Recent Labs    03/01/19 1344 04/26/19 1245 06/28/19 1236  NA 140 138 139  K 4.6 4.4 4.5  CL 107 105 104  CO2 '26 25 28  ' GLUCOSE 105* 98 99  BUN 30* 33* 28*  CREATININE 1.45* 1.36* 1.45*  CALCIUM 9.4 9.8 9.4  GFRNONAA 44* 47* 44*  GFRAA 51* 55* 51*  PROT 6.7 7.0 7.0  ALBUMIN 3.8 3.9 3.8  AST '19 19 20  ' ALT '16 16 18  ' ALKPHOS 56 59 63  BILITOT 0.6 0.6 0.8    RADIOGRAPHIC STUDIES: I have personally reviewed the radiological images as listed and agreed with the findings in the report. no recent images. 11/04/2017  US abdomen Stable right renal cyst. Status post cholecystectomy.Previously seen lesion within the liver is not well appreciated on this exam. No Splenomegaly.   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  Cytogenetics revealed Trisomy 12 IGVH unmutated.  ASSESSMENT & PLAN:  1. CLL (chronic lymphocytic leukemia) ([place])    # CLL, intermediate risk with trisomy 12. IGVH unmutated. Stage III, extensive bone marrow involvement. Patient is currently on acalabrutinib 100 mg twice daily and he tolerates well. Labs reviewed and discussed with patient. Counts are stable.  Continue with current regimen.  With upcoming surgery, discussed with patient that I recommend patient to hold acalabrutinib 3 days before and 3 days after his most surgery due to increased bleeding risk.  Patient understands and will update his surgeon.   #GERD/hiatal hernia, continue Tums and Pepcid.  Symptoms are stable.  Thrombocytopenia, count is stable.  Platelet 122,000.  Stable.  Continue to monitor. # CKD, Stage 3.  Stable creatinine level.  Continue to monitor. Avoid NSAIDs - no drug interaction  between acalabrutinib and propanolol.   Orders Placed This Encounter  Procedures  . CBC with Differential/Platelet    Standing Status:   Future    Standing Expiration Date:   06/27/2020  . Comprehensive metabolic panel    Standing Status:   Future    Standing Expiration Date:   06/27/2020   Follow up in 2 months  [name], MD, PhD Hematology Oncology [place] at [facility] 06/28/2019

## 2019-06-28 NOTE — Progress Notes (Signed)
Patient here for follow up. No concerns voiced.  °

## 2019-08-18 MED FILL — CALQUENCE 100 MG CAPSULE: 100 | 30 days supply | Qty: 60 | Fill #3

## 2019-08-30 ENCOUNTER — Ambulatory Visit: Payer: Medicare Other | Admitting: Oncology

## 2019-08-30 ENCOUNTER — Other Ambulatory Visit: Payer: Medicare Other

## 2019-08-30 ENCOUNTER — Telehealth: Payer: Self-pay | Admitting: Pharmacy Technician

## 2019-08-30 NOTE — Telephone Encounter (Signed)
Oral Oncology Patient Advocate Encounter   Was successful in securing patient an additional $8,400 grant from Patient [facility]) to provide copayment coverage for Calquence.  This will keep the out of pocket expense at $0.    Patient exhausted funds and was awarded another grant that will be good until the end date of the original grant.  Member ID: [id number] Group ID: [id number] RxBin: [id number] Dates of Eligibility: 08/29/19 through 09/28/19  Fund:  [place] [facility] [place] Phone [phone] Fax [phone] 08/30/2019 4:33 PM

## 2019-09-06 ENCOUNTER — Inpatient Hospital Stay: Payer: Medicare Other

## 2019-09-06 ENCOUNTER — Other Ambulatory Visit: Payer: Self-pay

## 2019-09-06 ENCOUNTER — Inpatient Hospital Stay: Payer: Medicare Other | Attending: Oncology | Admitting: Oncology

## 2019-09-06 ENCOUNTER — Encounter: Payer: Self-pay | Admitting: Oncology

## 2019-09-06 VITALS — BP 116/76 | HR 57 | Temp 96.2°F | Resp 16 | Wt 173.5 lb

## 2019-09-06 DIAGNOSIS — Z79899 Other long term (current) drug therapy: Secondary | ICD-10-CM | POA: Diagnosis not present

## 2019-09-06 DIAGNOSIS — D696 Thrombocytopenia, unspecified: Secondary | ICD-10-CM | POA: Insufficient documentation

## 2019-09-06 DIAGNOSIS — Z85828 Personal history of other malignant neoplasm of skin: Secondary | ICD-10-CM | POA: Insufficient documentation

## 2019-09-06 DIAGNOSIS — N1831 Chronic kidney disease, stage 3a: Secondary | ICD-10-CM | POA: Insufficient documentation

## 2019-09-06 DIAGNOSIS — C9110 Chronic lymphocytic leukemia of B-cell type not having achieved remission: Secondary | ICD-10-CM | POA: Insufficient documentation

## 2019-09-06 DIAGNOSIS — Z7982 Long term (current) use of aspirin: Secondary | ICD-10-CM | POA: Insufficient documentation

## 2019-09-06 DIAGNOSIS — Z8546 Personal history of malignant neoplasm of prostate: Secondary | ICD-10-CM | POA: Insufficient documentation

## 2019-09-06 DIAGNOSIS — Z803 Family history of malignant neoplasm of breast: Secondary | ICD-10-CM | POA: Insufficient documentation

## 2019-09-06 DIAGNOSIS — Z801 Family history of malignant neoplasm of trachea, bronchus and lung: Secondary | ICD-10-CM | POA: Insufficient documentation

## 2019-09-06 DIAGNOSIS — Z8249 Family history of ischemic heart disease and other diseases of the circulatory system: Secondary | ICD-10-CM | POA: Insufficient documentation

## 2019-09-06 DIAGNOSIS — E039 Hypothyroidism, unspecified: Secondary | ICD-10-CM | POA: Insufficient documentation

## 2019-09-06 DIAGNOSIS — Z5111 Encounter for antineoplastic chemotherapy: Secondary | ICD-10-CM

## 2019-09-06 DIAGNOSIS — D472 Monoclonal gammopathy: Secondary | ICD-10-CM | POA: Diagnosis not present

## 2019-09-06 DIAGNOSIS — K219 Gastro-esophageal reflux disease without esophagitis: Secondary | ICD-10-CM | POA: Insufficient documentation

## 2019-09-06 DIAGNOSIS — Z8262 Family history of osteoporosis: Secondary | ICD-10-CM | POA: Insufficient documentation

## 2019-09-06 DIAGNOSIS — K449 Diaphragmatic hernia without obstruction or gangrene: Secondary | ICD-10-CM | POA: Diagnosis not present

## 2019-09-06 DIAGNOSIS — I10 Essential (primary) hypertension: Secondary | ICD-10-CM | POA: Insufficient documentation

## 2019-09-06 DIAGNOSIS — Z8052 Family history of malignant neoplasm of bladder: Secondary | ICD-10-CM | POA: Insufficient documentation

## 2019-09-06 LAB — CBC WITH DIFFERENTIAL/PLATELET
Abs Immature Granulocytes: 0.06 10*3/uL (ref 0.00–0.07)
Basophils Absolute: 0.1 10*3/uL (ref 0.0–0.1)
Basophils Relative: 1 %
Eosinophils Absolute: 0.1 10*3/uL (ref 0.0–0.5)
Eosinophils Relative: 2 %
HCT: 38.4 % — ABNORMAL LOW (ref 39.0–52.0)
Hemoglobin: 13 g/dL (ref 13.0–17.0)
Immature Granulocytes: 1 %
Lymphocytes Relative: 31 %
Lymphs Abs: 1.6 10*3/uL (ref 0.7–4.0)
MCH: 34.9 pg — ABNORMAL HIGH (ref 26.0–34.0)
MCHC: 33.9 g/dL (ref 30.0–36.0)
MCV: 103.2 fL — ABNORMAL HIGH (ref 80.0–100.0)
Monocytes Absolute: 0.6 10*3/uL (ref 0.1–1.0)
Monocytes Relative: 11 %
Neutro Abs: 2.7 10*3/uL (ref 1.7–7.7)
Neutrophils Relative %: 54 %
Platelets: 113 10*3/uL — ABNORMAL LOW (ref 150–400)
RBC: 3.72 MIL/uL — ABNORMAL LOW (ref 4.22–5.81)
RDW: 12.7 % (ref 11.5–15.5)
WBC: 5.2 10*3/uL (ref 4.0–10.5)
nRBC: 0 % (ref 0.0–0.2)

## 2019-09-06 LAB — COMPREHENSIVE METABOLIC PANEL
ALT: 17 U/L (ref 0–44)
AST: 21 U/L (ref 15–41)
Albumin: 3.9 g/dL (ref 3.5–5.0)
Alkaline Phosphatase: 60 U/L (ref 38–126)
Anion gap: 7 (ref 5–15)
BUN: 30 mg/dL — ABNORMAL HIGH (ref 8–23)
CO2: 28 mmol/L (ref 22–32)
Calcium: 9.3 mg/dL (ref 8.9–10.3)
Chloride: 106 mmol/L (ref 98–111)
Creatinine, Ser: 1.4 mg/dL — ABNORMAL HIGH (ref 0.61–1.24)
GFR calc Af Amer: 53 mL/min — ABNORMAL LOW (ref >60)
GFR calc non Af Amer: 45 mL/min — ABNORMAL LOW (ref >60)
Glucose, Bld: 108 mg/dL — ABNORMAL HIGH (ref 70–99)
Potassium: 3.9 mmol/L (ref 3.5–5.1)
Sodium: 141 mmol/L (ref 135–145)
Total Bilirubin: 0.9 mg/dL (ref 0.3–1.2)
Total Protein: 7 g/dL (ref 6.5–8.1)

## 2019-09-06 NOTE — Progress Notes (Signed)
Patient denies new problems/concerns today.   °

## 2019-09-06 NOTE — Progress Notes (Signed)
Hematology/Oncology Follow Up Note [facility] Telephone:([phone] [phone]  CONSULT NOTE Patient Care Team: Tracie Harrier, MD as PCP - General (Internal Medicine)  REASON FOR VISIT  follow up for CLL and iron deficiency anemia  HISTORY OF PRESENTING ILLNESS:  Allen Chang 86 y.o.  male with PMH listed as below who was referred by primary care provider to me for evaluation of leukocytosis. He has a history of prostate cancer which was diagnosed 10 years ago. He underwent brachytherapy. He is not any castration treatment. Per patient, he follows up with Dr.Wolf and most recent PSA is normal.   Patient also reports feeling fatigue lately. Recent labs show leukocytosis, mild anemia, macrocytosis, mild thrombocytopenia, low ferritin. Denies blood in the stool. He was started on over the counter iron supplement but wife who manages patient's medication decides to only let patient to take iron medication every other day.   # received IV iron Venofer x 4. Colonoscopy was done on 02/16/2017 which showed polyps, internal hemorrhoids, negative for malignancy.  ##Patient had bone marrow biopsy on 09/22/2018 Pathology showed hypercellular bone marrow with extensive involvement by chronic lymphocytic leukemia. # Started on acalabrutinib 100 mg twice daily on 10/01/2018. 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  INTERVAL HISTORY Allen Chang is a 86 y.o. male who has above history reviewed by me presents for 4 months follow up for CLL and iron deficiency anemia, macrocytic anemia.  Patient has been on acalabrutinib 100 mg twice daily since Aug 2020.  He has tolerated very well. Denies any new complaints. He has also gained weight  Review of Systems  Constitutional: Negative for appetite change, chills, fatigue, fever and unexpected weight change.  HENT:   Negative for hearing loss and voice change.   Eyes: Negative for eye problems and  icterus.  Respiratory: Negative for chest tightness, cough and shortness of breath.   Cardiovascular: Negative for chest pain and leg swelling.  Gastrointestinal: Negative for abdominal distention and abdominal pain.  Endocrine: Negative for hot flashes.  Genitourinary: Negative for difficulty urinating, dysuria and frequency.   Musculoskeletal: Positive for arthralgias. Negative for back pain.  Skin: Negative for itching and rash.       Skin cancer  Neurological: Negative for light-headedness and numbness.  Hematological: Negative for adenopathy. Does not bruise/bleed easily.  Psychiatric/Behavioral: Negative for confusion.    MEDICAL HISTORY:  Past Medical History:  Diagnosis Date  . Cancer [facility])    prostate   . Complication of anesthesia    bradycardia, in ICU for 24 hour after galbladder surgery.   . Diverticulosis 2012  . Dysrhythmia    Heart skips a beat  . Elevated lipids   . GERD (gastroesophageal reflux disease)   . History of hiatal hernia   . Hypertension   . Hypothyroidism   . Leukemia ([place])   . Prostate cancer ([place])   . Skin cancer    face, scalp, behind ear,back and hand  . Tremors of nervous system     SURGICAL HISTORY: Past Surgical History:  Procedure Laterality Date  . BLADDER TUMOR EXCISION    . CARDIAC CATHETERIZATION    . CHOLECYSTECTOMY N/A 10/10/2014   Procedure: LAPAROSCOPIC CHOLECYSTECTOMY WITH INTRAOPERATIVE CHOLANGIOGRAM;  Surgeon: Leonie Green, MD;  Location: [facility] ORS;  Service: General;  Laterality: N/A;  . COLONOSCOPY WITH PROPOFOL N/A 02/16/2017   Procedure: COLONOSCOPY WITH PROPOFOL;  Surgeon: Manya Silvas, MD;  Location: [facility] ENDOSCOPY;  Service: Endoscopy;  Laterality: N/A;  . ESOPHAGOGASTRODUODENOSCOPY (EGD) WITH PROPOFOL N/A 02/16/2017   Procedure: ESOPHAGOGASTRODUODENOSCOPY (EGD) WITH PROPOFOL;  Surgeon: Manya Silvas, MD;  Location: [facility] ENDOSCOPY;  Service: Endoscopy;  Laterality: N/A;  . HEMORRHOID SURGERY    .  HERNIA REPAIR Left    x2  . JOINT REPLACEMENT Left    Partial Knee Replacement, Dr. Roland Rack  . PARTIAL KNEE ARTHROPLASTY Left 12/12/2014   Procedure: UNICOMPARTMENTAL KNEE;  Surgeon: Corky Mull, MD;  Location: [facility] ORS;  Service: Orthopedics;  Laterality: Left;  . prostate seeding    . SHOULDER ARTHROSCOPY Right   . SHOULDER ARTHROSCOPY WITH OPEN ROTATOR CUFF REPAIR Left 02/07/2016   Procedure: SHOULDER ARTHROSCOPY WITH OPEN ROTATOR CUFF REPAIR;  Surgeon: Corky Mull, MD;  Location: [facility] ORS;  Service: Orthopedics;  Laterality: Left;    SOCIAL HISTORY: Social History   Socioeconomic History  . Marital status: Married    Spouse name: Not on file  . Number of children: Not on file  . Years of education: Not on file  . Highest education level: Not on file  Occupational History  . Not on file  Tobacco Use  . Smoking status: Never Smoker  . Smokeless tobacco: Never Used  Vaping Use  . Vaping Use: Never used  Substance and Sexual Activity  . Alcohol use: No  . Drug use: No  . Sexual activity: Yes  Other Topics Concern  . Not on file  Social History Narrative  . Not on file   Social Determinants of Health   Financial Resource Strain:   . Difficulty of Paying Living Expenses:   Food Insecurity:   . Worried About Charity fundraiser in the Last Year:   . Arboriculturist in the Last Year:   Transportation Needs:   . Film/video editor (Medical):   Marland Kitchen Lack of Transportation (Non-Medical):   Physical Activity:   . Days of Exercise per Week:   . Minutes of Exercise per Session:   Stress:   . Feeling of Stress :   Social Connections:   . Frequency of Communication with Friends and Family:   . Frequency of Social Gatherings with Friends and Family:   . Attends Religious Services:   . Active Member of Clubs or Organizations:   . Attends Archivist Meetings:   Marland Kitchen Marital Status:   Intimate Partner Violence:   . Fear of Current or Ex-Partner:   . Emotionally  Abused:   Marland Kitchen Physically Abused:   . Sexually Abused:     FAMILY HISTORY: Family History  Problem Relation Age of Onset  . Bone cancer Father   . Hypertension Mother   . Osteoporosis Mother   . Breast cancer Sister   . Cancer Brother   . Cancer Brother   . Lung cancer Brother   . Bladder Cancer Brother   . Melanoma Brother     ALLERGIES:  is allergic to oxycodone-acetaminophen, percocet [oxycodone-acetaminophen], and voltaren [diclofenac sodium].  MEDICATIONS:  Current Outpatient Medications  Medication Sig Dispense Refill  . acetaminophen (TYLENOL) 500 MG tablet Take by mouth.    . Apoaequorin (PREVAGEN PO) Take 1 tablet by mouth daily.    Marland Kitchen ascorbic acid (VITAMIN C) 1000 MG tablet Take by mouth.    Marland Kitchen aspirin 81 MG tablet Take 81 mg by mouth daily. In am    . b complex vitamins capsule Take 1 capsule by mouth daily.    Marland Kitchen CALQUENCE 100 MG capsule TAKE 1  CAPSULE BY MOUTH EVERY 12 HOURS 60 capsule 3  . Cholecalciferol 25 MCG (1000 UT) tablet Take by mouth.    . DULoxetine (CYMBALTA) 20 MG capsule Take 20 mg by mouth daily.    . famotidine (PEPCID) 40 MG tablet Take 40 mg by mouth 2 (two) times daily.    . ferrous sulfate 324 MG TBEC Take 324 mg by mouth daily with breakfast.     . Flaxseed, Linseed, (FLAX SEED OIL) 1000 MG CAPS Take 1 capsule by mouth daily.    Marland Kitchen gabapentin (NEURONTIN) 100 MG capsule Take 100 mg by mouth 2 (two) times daily.    . Garlic 1975 MG CAPS Take 1 capsule by mouth every morning.    . latanoprost (XALATAN) 0.005 % ophthalmic solution Place 1 drop into both eyes at bedtime.    Marland Kitchen levothyroxine (SYNTHROID, LEVOTHROID) 25 MCG tablet Take 25 mcg by mouth daily at 12 noon.    Marland Kitchen lisinopril (PRINIVIL,ZESTRIL) 20 MG tablet Take 10 mg by mouth daily. In am    . loratadine (CLARITIN REDITABS) 10 MG dissolvable tablet Take 10 mg by mouth daily. In am    . Misc Natural Products (BLACK CHERRY CONCENTRATE) LIQD Take 15 mLs by mouth daily.     . Multiple  Vitamins-Minerals (MULTIVITAMIN WITH MINERALS) tablet Take 1 tablet by mouth daily. In am    . Omega-3 Fatty Acids (FISH OIL) 1000 MG CAPS Take 1 capsule by mouth daily.    . propranolol (INDERAL) 10 MG tablet Take 10 mg by mouth 2 (two) times daily.    . traMADol (ULTRAM) 50 MG tablet Take 50 mg by mouth every 6 (six) hours as needed.     . triamcinolone lotion (KENALOG) 0.1 % Apply 1 application topically once.    . dicyclomine (BENTYL) 10 MG capsule Take 10 mg by mouth 4 (four) times daily -  before meals and at bedtime. (Patient not taking: Reported on 09/06/2019)    . docusate sodium (COLACE) 100 MG capsule Take 200 mg by mouth daily.  (Patient not taking: Reported on 09/06/2019)    . omeprazole (PRILOSEC) 20 MG capsule Take 20 mg by mouth daily.  (Patient not taking: Reported on 09/06/2019)    . polyethylene glycol (MIRALAX / GLYCOLAX) packet Take 17 g by mouth daily. (Patient not taking: Reported on 09/06/2019)    . primidone (MYSOLINE) 50 MG tablet Take 50 mg by mouth 2 (two) times daily.     No current facility-administered medications for this visit.      Marland Kitchen  PHYSICAL EXAMINATION: ECOG PERFORMANCE STATUS: 0 - Asymptomatic Vitals:   09/06/19 1009  BP: 116/76  Pulse: (!) 57  Resp: 16  Temp: (!) 96.2 F (35.7 C)   Filed Weights   09/06/19 1009  Weight: 173 lb 8 oz (78.7 kg)   Physical Exam Constitutional:      General: He is not in acute distress.    Appearance: He is not diaphoretic.     Comments: Patient walks with a cane  HENT:     Head: Normocephalic and atraumatic.     Nose: Nose normal.     Mouth/Throat:     Pharynx: No oropharyngeal exudate.  Eyes:     General: No scleral icterus.    Pupils: Pupils are equal, round, and reactive to light.  Cardiovascular:     Rate and Rhythm: Normal rate and regular rhythm.     Heart sounds: No murmur heard.   Pulmonary:  Effort: Pulmonary effort is normal. No respiratory distress.     Breath sounds: No rales.  Chest:      Chest wall: No tenderness.  Abdominal:     General: There is no distension.     Palpations: Abdomen is soft.     Tenderness: There is no abdominal tenderness.  Musculoskeletal:        General: Normal range of motion.     Cervical back: Normal range of motion and neck supple.  Skin:    General: Skin is warm and dry.     Findings: No erythema.  Neurological:     Mental Status: He is alert and oriented to person, place, and time.  Psychiatric:        Mood and Affect: Affect normal.   .    LABORATORY DATA:  I have reviewed the data as listed Lab Results  Component Value Date   WBC 5.2 09/06/2019   HGB 13.0 09/06/2019   HCT 38.4 (L) 09/06/2019   MCV 103.2 (H) 09/06/2019   PLT 113 (L) 09/06/2019   Recent Labs    04/26/19 1245 06/28/19 1236 09/06/19 0954  NA 138 139 141  K 4.4 4.5 3.9  CL 105 104 106  CO2 '25 28 28  ' GLUCOSE 98 99 108*  BUN 33* 28* 30*  CREATININE 1.36* 1.45* 1.40*  CALCIUM 9.8 9.4 9.3  GFRNONAA 47* 44* 45*  GFRAA 55* 51* 53*  PROT 7.0 7.0 7.0  ALBUMIN 3.9 3.8 3.9  AST '19 20 21  ' ALT '16 18 17  ' ALKPHOS 59 63 60  BILITOT 0.6 0.8 0.9    RADIOGRAPHIC STUDIES: I have personally reviewed the radiological images as listed and agreed with the findings in the report. no recent images. 11/04/2017  US abdomen Stable right renal cyst. Status post cholecystectomy.Previously seen lesion within the liver is not well appreciated on this exam. No Splenomegaly.   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  Cytogenetics revealed Trisomy 12 IGVH unmutated.  ASSESSMENT & PLAN:  1. CLL (chronic lymphocytic leukemia) ([place])   2. Encounter for antineoplastic chemotherapy   3. Stage 3a chronic kidney disease   4. Gastroesophageal reflux disease, unspecified whether esophagitis present   5. Thrombocytopenia ([place])   6. MGUS (monoclonal gammopathy of unknown significance)    # CLL, intermediate risk with trisomy 12. IGVH unmutated. Stage III,  extensive bone marrow involvement. Patient is currently on acalabrutinib 100 mg twice daily and he tolerates well. Labs are reviewed and discussed with patient. Counts acceptable to continue with current regimen. He has had a good response to the treatment.  Hemoglobin has completely normalized.  #CKD, avoid nephrotoxins. #Thrombocytopenia, stable.  #GERD/hiatal hernia, continue Tums and Pepcid.  Stable symptoms. #MGUS, I will check multiple myeloma panel at the next visit. Orders Placed This Encounter  Procedures  . Comprehensive metabolic panel    Standing Status:   Future    Standing Expiration Date:   09/05/2020  . CBC with Differential/Platelet    Standing Status:   Future    Standing Expiration Date:   09/05/2020   Follow up in 2 months  [name], MD, PhD Hematology Oncology [place] at [facility] 09/06/2019

## 2019-09-14 ENCOUNTER — Other Ambulatory Visit: Payer: Self-pay | Admitting: Oncology

## 2019-09-14 DIAGNOSIS — C9110 Chronic lymphocytic leukemia of B-cell type not having achieved remission: Secondary | ICD-10-CM

## 2019-09-20 MED FILL — CALQUENCE 100 MG CAPSULE: 100 | 30 days supply | Qty: 60 | Fill #0

## 2019-10-17 ENCOUNTER — Telehealth: Payer: Self-pay | Admitting: Pharmacy Technician

## 2019-10-17 MED FILL — CALQUENCE 100 MG CAPSULE: 100 | 30 days supply | Qty: 60 | Fill #1

## 2019-10-17 NOTE — Telephone Encounter (Signed)
Oral Oncology Patient Advocate Encounter   Was successful in securing patient an $12,700 grant from [facility] [employer] [facility]) to provide copayment coverage for Calquence.  This will keep the out of pocket expense at $0.    The billing information is as follows and has been shared with [place].   Member ID: [id number] Group ID: [id number] RxBin: [id number] Dates of Eligibility: 09/29/19 through 09/27/20  Fund:   Chronic Lymphocytic [place] Patient [place] Phone [phone] Fax [phone] 10/17/2019 10:20 AM

## 2019-11-10 ENCOUNTER — Inpatient Hospital Stay (HOSPITAL_BASED_OUTPATIENT_CLINIC_OR_DEPARTMENT_OTHER): Payer: Medicare Other | Admitting: Oncology

## 2019-11-10 ENCOUNTER — Other Ambulatory Visit: Payer: Self-pay

## 2019-11-10 ENCOUNTER — Inpatient Hospital Stay: Payer: Medicare Other | Attending: Oncology

## 2019-11-10 ENCOUNTER — Encounter: Payer: Self-pay | Admitting: Oncology

## 2019-11-10 VITALS — BP 137/70 | HR 54 | Temp 96.5°F | Resp 18 | Wt 174.8 lb

## 2019-11-10 DIAGNOSIS — D472 Monoclonal gammopathy: Secondary | ICD-10-CM

## 2019-11-10 DIAGNOSIS — N1831 Chronic kidney disease, stage 3a: Secondary | ICD-10-CM | POA: Diagnosis not present

## 2019-11-10 DIAGNOSIS — K449 Diaphragmatic hernia without obstruction or gangrene: Secondary | ICD-10-CM | POA: Insufficient documentation

## 2019-11-10 DIAGNOSIS — Z5111 Encounter for antineoplastic chemotherapy: Secondary | ICD-10-CM

## 2019-11-10 DIAGNOSIS — Z9049 Acquired absence of other specified parts of digestive tract: Secondary | ICD-10-CM | POA: Diagnosis not present

## 2019-11-10 DIAGNOSIS — D509 Iron deficiency anemia, unspecified: Secondary | ICD-10-CM | POA: Diagnosis not present

## 2019-11-10 DIAGNOSIS — Z923 Personal history of irradiation: Secondary | ICD-10-CM | POA: Diagnosis not present

## 2019-11-10 DIAGNOSIS — Z7982 Long term (current) use of aspirin: Secondary | ICD-10-CM | POA: Insufficient documentation

## 2019-11-10 DIAGNOSIS — Z85828 Personal history of other malignant neoplasm of skin: Secondary | ICD-10-CM | POA: Diagnosis not present

## 2019-11-10 DIAGNOSIS — K219 Gastro-esophageal reflux disease without esophagitis: Secondary | ICD-10-CM | POA: Insufficient documentation

## 2019-11-10 DIAGNOSIS — D649 Anemia, unspecified: Secondary | ICD-10-CM | POA: Diagnosis not present

## 2019-11-10 DIAGNOSIS — C9110 Chronic lymphocytic leukemia of B-cell type not having achieved remission: Secondary | ICD-10-CM | POA: Insufficient documentation

## 2019-11-10 DIAGNOSIS — D696 Thrombocytopenia, unspecified: Secondary | ICD-10-CM | POA: Diagnosis not present

## 2019-11-10 DIAGNOSIS — I129 Hypertensive chronic kidney disease with stage 1 through stage 4 chronic kidney disease, or unspecified chronic kidney disease: Secondary | ICD-10-CM | POA: Insufficient documentation

## 2019-11-10 DIAGNOSIS — E785 Hyperlipidemia, unspecified: Secondary | ICD-10-CM | POA: Insufficient documentation

## 2019-11-10 DIAGNOSIS — Z79899 Other long term (current) drug therapy: Secondary | ICD-10-CM | POA: Insufficient documentation

## 2019-11-10 DIAGNOSIS — M549 Dorsalgia, unspecified: Secondary | ICD-10-CM | POA: Insufficient documentation

## 2019-11-10 DIAGNOSIS — D72829 Elevated white blood cell count, unspecified: Secondary | ICD-10-CM | POA: Insufficient documentation

## 2019-11-10 DIAGNOSIS — E039 Hypothyroidism, unspecified: Secondary | ICD-10-CM | POA: Diagnosis not present

## 2019-11-10 DIAGNOSIS — Z8546 Personal history of malignant neoplasm of prostate: Secondary | ICD-10-CM | POA: Insufficient documentation

## 2019-11-10 LAB — CBC WITH DIFFERENTIAL/PLATELET
Abs Immature Granulocytes: 0.08 10*3/uL — ABNORMAL HIGH (ref 0.00–0.07)
Basophils Absolute: 0.1 10*3/uL (ref 0.0–0.1)
Basophils Relative: 1 %
Eosinophils Absolute: 0.2 10*3/uL (ref 0.0–0.5)
Eosinophils Relative: 2 %
HCT: 36.1 % — ABNORMAL LOW (ref 39.0–52.0)
Hemoglobin: 12.2 g/dL — ABNORMAL LOW (ref 13.0–17.0)
Immature Granulocytes: 1 %
Lymphocytes Relative: 29 %
Lymphs Abs: 2.2 10*3/uL (ref 0.7–4.0)
MCH: 35.2 pg — ABNORMAL HIGH (ref 26.0–34.0)
MCHC: 33.8 g/dL (ref 30.0–36.0)
MCV: 104 fL — ABNORMAL HIGH (ref 80.0–100.0)
Monocytes Absolute: 0.7 10*3/uL (ref 0.1–1.0)
Monocytes Relative: 10 %
Neutro Abs: 4.2 10*3/uL (ref 1.7–7.7)
Neutrophils Relative %: 57 %
Platelets: 133 10*3/uL — ABNORMAL LOW (ref 150–400)
RBC: 3.47 MIL/uL — ABNORMAL LOW (ref 4.22–5.81)
RDW: 12.7 % (ref 11.5–15.5)
WBC: 7.4 10*3/uL (ref 4.0–10.5)
nRBC: 0 % (ref 0.0–0.2)

## 2019-11-10 LAB — COMPREHENSIVE METABOLIC PANEL
ALT: 15 U/L (ref 0–44)
AST: 17 U/L (ref 15–41)
Albumin: 3.5 g/dL (ref 3.5–5.0)
Alkaline Phosphatase: 63 U/L (ref 38–126)
Anion gap: 8 (ref 5–15)
BUN: 26 mg/dL — ABNORMAL HIGH (ref 8–23)
CO2: 29 mmol/L (ref 22–32)
Calcium: 9.2 mg/dL (ref 8.9–10.3)
Chloride: 102 mmol/L (ref 98–111)
Creatinine, Ser: 1.39 mg/dL — ABNORMAL HIGH (ref 0.61–1.24)
GFR calc Af Amer: 53 mL/min — ABNORMAL LOW (ref >60)
GFR calc non Af Amer: 46 mL/min — ABNORMAL LOW (ref >60)
Glucose, Bld: 122 mg/dL — ABNORMAL HIGH (ref 70–99)
Potassium: 4.4 mmol/L (ref 3.5–5.1)
Sodium: 139 mmol/L (ref 135–145)
Total Bilirubin: 0.8 mg/dL (ref 0.3–1.2)
Total Protein: 7 g/dL (ref 6.5–8.1)

## 2019-11-10 MED ORDER — CALQUENCE 100 MG PO CAPS: ORAL_CAPSULE | ORAL | 3 refills | Status: DC

## 2019-11-10 NOTE — Progress Notes (Signed)
Pt here for follow up. No new concerns voiced.   

## 2019-11-10 NOTE — Progress Notes (Signed)
Hematology/Oncology Follow Up Note [facility] [phone] [phone]  CONSULT NOTE Patient Care Team: Tracie Harrier, MD as PCP - General (Internal Medicine)  REASON FOR VISIT  follow up for CLL and iron deficiency anemia  HISTORY OF PRESENTING ILLNESS:  Allen Chang 86 y.o.  male with PMH listed as below who was referred by primary care provider to me for evaluation of leukocytosis. He has a history of prostate cancer which was diagnosed 10 years ago. He underwent brachytherapy. He is not any castration treatment. Per patient, he follows up with Dr.Wolf and most recent PSA is normal.   Patient also reports feeling fatigue lately. Recent labs show leukocytosis, mild anemia, macrocytosis, mild thrombocytopenia, low ferritin. Denies blood in the stool. He was started on over the counter iron supplement but wife who manages patient's medication decides to only let patient to take iron medication every other day.   # received IV iron Venofer x 4. Colonoscopy was done on 02/16/2017 which showed polyps, internal hemorrhoids, negative for malignancy.  ##Patient had bone marrow biopsy on 09/22/2018 Pathology showed hypercellular bone marrow with extensive involvement by chronic lymphocytic leukemia. # Started on acalabrutinib 100 mg twice daily on 10/01/2018. 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  INTERVAL HISTORY Allen Chang is a 86 y.o. male who has above history reviewed by me presents for 4 months follow up for CLL and iron deficiency anemia, macrocytic anemia.  Patient has been on acalabrutinib 100 mg twice daily since Aug 2020.  He has tolerated very well. He tolerates well, and has no new complaints.  Back pain is stable.  Review of Systems  Constitutional: Negative for appetite change, chills, fatigue, fever and unexpected weight change.  HENT:   Negative for hearing loss and voice change.   Eyes: Negative for  eye problems and icterus.  Respiratory: Negative for chest tightness, cough and shortness of breath.   Cardiovascular: Negative for chest pain and leg swelling.  Gastrointestinal: Negative for abdominal distention and abdominal pain.  Endocrine: Negative for hot flashes.  Genitourinary: Negative for difficulty urinating, dysuria and frequency.   Musculoskeletal: Positive for arthralgias. Negative for back pain.  Skin: Negative for itching and rash.       Skin cancer  Neurological: Negative for light-headedness and numbness.  Hematological: Negative for adenopathy. Does not bruise/bleed easily.  Psychiatric/Behavioral: Negative for confusion.    MEDICAL HISTORY:  Past Medical History:  Diagnosis Date  . Cancer [facility])    prostate   . Complication of anesthesia    bradycardia, in ICU for 24 hour after galbladder surgery.   . Diverticulosis 2012  . Dysrhythmia    Heart skips a beat  . Elevated lipids   . GERD (gastroesophageal reflux disease)   . History of hiatal hernia   . Hypertension   . Hypothyroidism   . Leukemia ([place])   . Prostate cancer ([place])   . Skin cancer    face, scalp, behind ear,back and hand  . Tremors of nervous system     SURGICAL HISTORY: Past Surgical History:  Procedure Laterality Date  . BLADDER TUMOR EXCISION    . CARDIAC CATHETERIZATION    . CHOLECYSTECTOMY N/A 10/10/2014   Procedure: LAPAROSCOPIC CHOLECYSTECTOMY WITH INTRAOPERATIVE CHOLANGIOGRAM;  Surgeon: Leonie Green, MD;  Location: [facility] ORS;  Service: General;  Laterality: N/A;  . COLONOSCOPY WITH PROPOFOL N/A 02/16/2017   Procedure: COLONOSCOPY WITH PROPOFOL;  Surgeon: Manya Silvas, MD;  Location: [facility]  ENDOSCOPY;  Service: Endoscopy;  Laterality: N/A;  . ESOPHAGOGASTRODUODENOSCOPY (EGD) WITH PROPOFOL N/A 02/16/2017   Procedure: ESOPHAGOGASTRODUODENOSCOPY (EGD) WITH PROPOFOL;  Surgeon: Manya Silvas, MD;  Location: [facility] ENDOSCOPY;  Service: Endoscopy;  Laterality: N/A;  . HEMORRHOID  SURGERY    . HERNIA REPAIR Left    x2  . JOINT REPLACEMENT Left    Partial Knee Replacement, Dr. Roland Rack  . PARTIAL KNEE ARTHROPLASTY Left 12/12/2014   Procedure: UNICOMPARTMENTAL KNEE;  Surgeon: Corky Mull, MD;  Location: [facility] ORS;  Service: Orthopedics;  Laterality: Left;  . prostate seeding    . SHOULDER ARTHROSCOPY Right   . SHOULDER ARTHROSCOPY WITH OPEN ROTATOR CUFF REPAIR Left 02/07/2016   Procedure: SHOULDER ARTHROSCOPY WITH OPEN ROTATOR CUFF REPAIR;  Surgeon: Corky Mull, MD;  Location: [facility] ORS;  Service: Orthopedics;  Laterality: Left;    SOCIAL HISTORY: Social History   Socioeconomic History  . Marital status: Married    Spouse name: Not on file  . Number of children: Not on file  . Years of education: Not on file  . Highest education level: Not on file  Occupational History  . Not on file  Tobacco Use  . Smoking status: Never Smoker  . Smokeless tobacco: Never Used  Vaping Use  . Vaping Use: Never used  Substance and Sexual Activity  . Alcohol use: No  . Drug use: No  . Sexual activity: Yes  Other Topics Concern  . Not on file  Social History Narrative  . Not on file   Social Determinants of Health   Financial Resource Strain:   . Difficulty of Paying Living Expenses: Not on file  Food Insecurity:   . Worried About Charity fundraiser in the Last Year: Not on file  . Ran Out of Food in the Last Year: Not on file  Transportation Needs:   . Lack of Transportation (Medical): Not on file  . Lack of Transportation (Non-Medical): Not on file  Physical Activity:   . Days of Exercise per Week: Not on file  . Minutes of Exercise per Session: Not on file  Stress:   . Feeling of Stress : Not on file  Social Connections:   . Frequency of Communication with Friends and Family: Not on file  . Frequency of Social Gatherings with Friends and Family: Not on file  . Attends Religious Services: Not on file  . Active Member of Clubs or Organizations: Not on file  .  Attends Archivist Meetings: Not on file  . Marital Status: Not on file  Intimate Partner Violence:   . Fear of Current or Ex-Partner: Not on file  . Emotionally Abused: Not on file  . Physically Abused: Not on file  . Sexually Abused: Not on file    FAMILY HISTORY: Family History  Problem Relation Age of Onset  . Bone cancer Father   . Hypertension Mother   . Osteoporosis Mother   . Breast cancer Sister   . Cancer Brother   . Cancer Brother   . Lung cancer Brother   . Bladder Cancer Brother   . Melanoma Brother     ALLERGIES:  is allergic to oxycodone-acetaminophen, percocet [oxycodone-acetaminophen], and voltaren [diclofenac sodium].  MEDICATIONS:  Current Outpatient Medications  Medication Sig Dispense Refill  . acalabrutinib (CALQUENCE) 100 MG capsule TAKE 1 CAPSULE BY MOUTH EVERY 12 HOURS 60 capsule 3  . Apoaequorin (PREVAGEN PO) Take 1 tablet by mouth daily.    Marland Kitchen aspirin 81 MG  tablet Take 81 mg by mouth daily. In am    . b complex vitamins capsule Take 1 capsule by mouth daily.    . Cholecalciferol 25 MCG (1000 UT) tablet Take by mouth.    . DULoxetine (CYMBALTA) 20 MG capsule Take 20 mg by mouth daily.    . famotidine (PEPCID) 40 MG tablet Take 40 mg by mouth 2 (two) times daily.    . ferrous sulfate 324 MG TBEC Take 324 mg by mouth daily with breakfast.     . Flaxseed, Linseed, (FLAX SEED OIL) 1000 MG CAPS Take 1 capsule by mouth daily.    Marland Kitchen gabapentin (NEURONTIN) 100 MG capsule Take 100 mg by mouth 2 (two) times daily.    . Garlic 5993 MG CAPS Take 1 capsule by mouth every morning.    . latanoprost (XALATAN) 0.005 % ophthalmic solution Place 1 drop into both eyes at bedtime.    Marland Kitchen levothyroxine (SYNTHROID, LEVOTHROID) 25 MCG tablet Take 25 mcg by mouth daily at 12 noon.    Marland Kitchen lisinopril (PRINIVIL,ZESTRIL) 20 MG tablet Take 10 mg by mouth daily. In am    . loratadine (CLARITIN REDITABS) 10 MG dissolvable tablet Take 10 mg by mouth daily. In am    . Misc  Natural Products (BLACK CHERRY CONCENTRATE) LIQD Take 15 mLs by mouth daily.     . Multiple Vitamins-Minerals (MULTIVITAMIN WITH MINERALS) tablet Take 1 tablet by mouth daily. In am    . Omega-3 Fatty Acids (FISH OIL) 1000 MG CAPS Take 1 capsule by mouth daily.    . propranolol (INDERAL) 10 MG tablet Take 10 mg by mouth 2 (two) times daily.    Marland Kitchen acetaminophen (TYLENOL) 500 MG tablet Take by mouth. (Patient not taking: Reported on 11/10/2019)    . traMADol (ULTRAM) 50 MG tablet Take 50 mg by mouth every 6 (six) hours as needed.  (Patient not taking: Reported on 11/10/2019)     No current facility-administered medications for this visit.      Marland Kitchen  PHYSICAL EXAMINATION: ECOG PERFORMANCE STATUS: 0 - Asymptomatic Vitals:   11/10/19 1046  BP: 137/70  Pulse: (!) 54  Resp: 18  Temp: (!) 96.5 F (35.8 C)   Filed Weights   11/10/19 1046  Weight: 174 lb 12.8 oz (79.3 kg)   Physical Exam Constitutional:      General: He is not in acute distress.    Appearance: He is not diaphoretic.     Comments: Patient walks with a cane  HENT:     Head: Normocephalic and atraumatic.     Nose: Nose normal.     Mouth/Throat:     Pharynx: No oropharyngeal exudate.  Eyes:     General: No scleral icterus.    Pupils: Pupils are equal, round, and reactive to light.  Cardiovascular:     Rate and Rhythm: Normal rate and regular rhythm.     Heart sounds: No murmur heard.   Pulmonary:     Effort: Pulmonary effort is normal. No respiratory distress.     Breath sounds: No rales.  Chest:     Chest wall: No tenderness.  Abdominal:     General: There is no distension.     Palpations: Abdomen is soft.     Tenderness: There is no abdominal tenderness.  Musculoskeletal:        General: Normal range of motion.     Cervical back: Normal range of motion and neck supple.  Skin:    General: Skin is  warm and dry.     Findings: No erythema.  Neurological:     Mental Status: He is alert and oriented to person,  place, and time.  Psychiatric:        Mood and Affect: Affect normal.   .    LABORATORY DATA:  I have reviewed the data as listed Lab Results  Component Value Date   WBC 7.4 11/10/2019   HGB 12.2 (L) 11/10/2019   HCT 36.1 (L) 11/10/2019   MCV 104.0 (H) 11/10/2019   PLT 133 (L) 11/10/2019   Recent Labs    06/28/19 1236 09/06/19 0954 11/10/19 1021  NA 139 141 139  K 4.5 3.9 4.4  CL 104 106 102  CO2 '28 28 29  ' GLUCOSE 99 108* 122*  BUN 28* 30* 26*  CREATININE 1.45* 1.40* 1.39*  CALCIUM 9.4 9.3 9.2  GFRNONAA 44* 45* 46*  GFRAA 51* 53* 53*  PROT 7.0 7.0 7.0  ALBUMIN 3.8 3.9 3.5  AST '20 21 17  ' ALT '18 17 15  ' ALKPHOS 63 60 63  BILITOT 0.8 0.9 0.8    RADIOGRAPHIC STUDIES: I have personally reviewed the radiological images as listed and agreed with the findings in the report. no recent images. 11/04/2017  US abdomen Stable right renal cyst. Status post cholecystectomy.Previously seen lesion within the liver is not well appreciated on this exam. No Splenomegaly.   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  Cytogenetics revealed Trisomy 12 IGVH unmutated.  ASSESSMENT & PLAN:  1. CLL (chronic lymphocytic leukemia) ([place])   2. Encounter for antineoplastic chemotherapy   3. Stage 3a chronic kidney disease   4. Thrombocytopenia ([place])   5. MGUS (monoclonal gammopathy of unknown significance)    # CLL, intermediate risk with trisomy 12. IGVH unmutated. Stage III, extensive bone marrow involvement. Patient is currently on acalabrutinib 100 mg twice daily He tolerates well.  Labs are reviewed and discussed with patient. Continue current regimen.   #CKD, creatinine is stable. avoid nephrotoxins. #Thrombocytopenia, count is stable .  #GERD/hiatal hernia, continue Tums and Pepcid.  Stable symptoms. #MGUS, multiple myeloma panel  Is pending.  Orders Placed This Encounter  Procedures  . CBC with Differential/Platelet    Standing Status:   Future     Standing Expiration Date:   11/09/2020  . Comprehensive metabolic panel    Standing Status:   Future    Standing Expiration Date:   11/09/2020  . Lactate dehydrogenase    Standing Status:   Future    Standing Expiration Date:   11/09/2020   Follow up in 3 months  [name], MD, PhD Hematology Oncology [place] [facility] 11/10/2019

## 2019-11-11 LAB — PROTEIN ELECTROPHORESIS, SERUM
A/G Ratio: 1.2 (ref 0.7–1.7)
Albumin ELP: 3.5 g/dL (ref 2.9–4.4)
Alpha-1-Globulin: 0.2 g/dL (ref 0.0–0.4)
Alpha-2-Globulin: 0.7 g/dL (ref 0.4–1.0)
Beta Globulin: 0.8 g/dL (ref 0.7–1.3)
Gamma Globulin: 1.3 g/dL (ref 0.4–1.8)
Globulin, Total: 3 g/dL (ref 2.2–3.9)
M-Spike, %: 1 g/dL — ABNORMAL HIGH
Total Protein ELP: 6.5 g/dL (ref 6.0–8.5)

## 2019-11-16 ENCOUNTER — Other Ambulatory Visit: Payer: Self-pay | Admitting: Oncology

## 2019-11-16 DIAGNOSIS — D472 Monoclonal gammopathy: Secondary | ICD-10-CM

## 2019-11-21 MED FILL — CALQUENCE 100 MG CAPSULE: 100 | 30 days supply | Qty: 60 | Fill #2

## 2019-12-15 ENCOUNTER — Emergency Department: Payer: Medicare Other

## 2019-12-15 ENCOUNTER — Encounter: Payer: Self-pay | Admitting: *Deleted

## 2019-12-15 ENCOUNTER — Other Ambulatory Visit: Payer: Self-pay

## 2019-12-15 DIAGNOSIS — Z8546 Personal history of malignant neoplasm of prostate: Secondary | ICD-10-CM | POA: Insufficient documentation

## 2019-12-15 DIAGNOSIS — Z85828 Personal history of other malignant neoplasm of skin: Secondary | ICD-10-CM | POA: Insufficient documentation

## 2019-12-15 DIAGNOSIS — Z79899 Other long term (current) drug therapy: Secondary | ICD-10-CM | POA: Diagnosis not present

## 2019-12-15 DIAGNOSIS — I4891 Unspecified atrial fibrillation: Secondary | ICD-10-CM | POA: Insufficient documentation

## 2019-12-15 DIAGNOSIS — R0600 Dyspnea, unspecified: Secondary | ICD-10-CM | POA: Insufficient documentation

## 2019-12-15 DIAGNOSIS — R079 Chest pain, unspecified: Principal | ICD-10-CM | POA: Insufficient documentation

## 2019-12-15 DIAGNOSIS — E039 Hypothyroidism, unspecified: Secondary | ICD-10-CM | POA: Diagnosis not present

## 2019-12-15 DIAGNOSIS — R55 Syncope and collapse: Secondary | ICD-10-CM | POA: Insufficient documentation

## 2019-12-15 DIAGNOSIS — Z20822 Contact with and (suspected) exposure to covid-19: Secondary | ICD-10-CM | POA: Insufficient documentation

## 2019-12-15 DIAGNOSIS — D696 Thrombocytopenia, unspecified: Secondary | ICD-10-CM | POA: Insufficient documentation

## 2019-12-15 DIAGNOSIS — I10 Essential (primary) hypertension: Secondary | ICD-10-CM | POA: Diagnosis not present

## 2019-12-15 DIAGNOSIS — Z7982 Long term (current) use of aspirin: Secondary | ICD-10-CM | POA: Insufficient documentation

## 2019-12-15 LAB — CBC WITH DIFFERENTIAL/PLATELET
Abs Immature Granulocytes: 0.09 10*3/uL — ABNORMAL HIGH (ref 0.00–0.07)
Basophils Absolute: 0.1 10*3/uL (ref 0.0–0.1)
Basophils Relative: 1 %
Eosinophils Absolute: 0.2 10*3/uL (ref 0.0–0.5)
Eosinophils Relative: 2 %
HCT: 37.2 % — ABNORMAL LOW (ref 39.0–52.0)
Hemoglobin: 12.7 g/dL — ABNORMAL LOW (ref 13.0–17.0)
Immature Granulocytes: 1 %
Lymphocytes Relative: 29 %
Lymphs Abs: 1.9 10*3/uL (ref 0.7–4.0)
MCH: 36 pg — ABNORMAL HIGH (ref 26.0–34.0)
MCHC: 34.1 g/dL (ref 30.0–36.0)
MCV: 105.4 fL — ABNORMAL HIGH (ref 80.0–100.0)
Monocytes Absolute: 0.7 10*3/uL (ref 0.1–1.0)
Monocytes Relative: 10 %
Neutro Abs: 3.7 10*3/uL (ref 1.7–7.7)
Neutrophils Relative %: 57 %
Platelets: 129 10*3/uL — ABNORMAL LOW (ref 150–400)
RBC: 3.53 MIL/uL — ABNORMAL LOW (ref 4.22–5.81)
RDW: 12.5 % (ref 11.5–15.5)
Smear Review: NORMAL
WBC: 6.6 10*3/uL (ref 4.0–10.5)
nRBC: 0 % (ref 0.0–0.2)

## 2019-12-15 LAB — TROPONIN I (HIGH SENSITIVITY)
Troponin I (High Sensitivity): 9 ng/L (ref <18)
Troponin I (High Sensitivity): 8 ng/L (ref <18)

## 2019-12-15 LAB — COMPREHENSIVE METABOLIC PANEL
ALT: 14 U/L (ref 0–44)
AST: 21 U/L (ref 15–41)
Albumin: 3.8 g/dL (ref 3.5–5.0)
Alkaline Phosphatase: 65 U/L (ref 38–126)
Anion gap: 10 (ref 5–15)
BUN: 36 mg/dL — ABNORMAL HIGH (ref 8–23)
CO2: 25 mmol/L (ref 22–32)
Calcium: 10.2 mg/dL (ref 8.9–10.3)
Chloride: 104 mmol/L (ref 98–111)
Creatinine, Ser: 1.48 mg/dL — ABNORMAL HIGH (ref 0.61–1.24)
GFR, Estimated: 46 mL/min — ABNORMAL LOW (ref >60)
Glucose, Bld: 152 mg/dL — ABNORMAL HIGH (ref 70–99)
Potassium: 4.3 mmol/L (ref 3.5–5.1)
Sodium: 139 mmol/L (ref 135–145)
Total Bilirubin: 0.7 mg/dL (ref 0.3–1.2)
Total Protein: 7.2 g/dL (ref 6.5–8.1)

## 2019-12-15 NOTE — ED Triage Notes (Signed)
Pt reporting chest pain that started suddenly while eating 2-3 hours ago. Near syncopal episode reported when the pain was at its worse but pt now reports the pain has subsided. No pain, SOB or lightheadedness at this time.   No cardiac hx per pt.

## 2019-12-15 NOTE — ED Notes (Signed)
Pt transported to Xray in NAD at this time.

## 2019-12-15 NOTE — ED Triage Notes (Signed)
EMS brings pt in from home for c/o upper CP; st pain began after eating supper; 4 81mg  ASA given by family; pain free upon arrival of EMS; hx hiatal hernia, no card hx

## 2019-12-16 ENCOUNTER — Emergency Department: Payer: Medicare Other

## 2019-12-16 ENCOUNTER — Other Ambulatory Visit: Payer: Self-pay

## 2019-12-16 ENCOUNTER — Observation Stay
Admission: EM | Admit: 2019-12-16 | Discharge: 2019-12-17 | Disposition: A | Discharge status: Home / Self Care | Payer: Medicare Other | Attending: Internal Medicine | Admitting: Internal Medicine

## 2019-12-16 ENCOUNTER — Observation Stay
Admit: 2019-12-16 | Discharge: 2019-12-16 | Disposition: A | Discharge status: Home / Self Care | Payer: Medicare Other | Attending: Internal Medicine | Admitting: Internal Medicine

## 2019-12-16 DIAGNOSIS — E119 Type 2 diabetes mellitus without complications: Secondary | ICD-10-CM

## 2019-12-16 DIAGNOSIS — I10 Essential (primary) hypertension: Secondary | ICD-10-CM | POA: Diagnosis present

## 2019-12-16 DIAGNOSIS — K449 Diaphragmatic hernia without obstruction or gangrene: Secondary | ICD-10-CM

## 2019-12-16 DIAGNOSIS — I4891 Unspecified atrial fibrillation: Secondary | ICD-10-CM

## 2019-12-16 DIAGNOSIS — E039 Hypothyroidism, unspecified: Secondary | ICD-10-CM | POA: Diagnosis present

## 2019-12-16 DIAGNOSIS — R42 Dizziness and giddiness: Secondary | ICD-10-CM

## 2019-12-16 DIAGNOSIS — N1831 Chronic kidney disease, stage 3a: Secondary | ICD-10-CM

## 2019-12-16 DIAGNOSIS — D696 Thrombocytopenia, unspecified: Secondary | ICD-10-CM | POA: Diagnosis present

## 2019-12-16 DIAGNOSIS — R079 Chest pain, unspecified: Secondary | ICD-10-CM

## 2019-12-16 DIAGNOSIS — C9110 Chronic lymphocytic leukemia of B-cell type not having achieved remission: Secondary | ICD-10-CM | POA: Diagnosis not present

## 2019-12-16 DIAGNOSIS — R55 Syncope and collapse: Secondary | ICD-10-CM

## 2019-12-16 LAB — CBG MONITORING, ED
Glucose-Capillary: 98 mg/dL (ref 70–99)
Glucose-Capillary: 132 mg/dL — ABNORMAL HIGH (ref 70–99)

## 2019-12-16 LAB — RESPIRATORY PANEL BY RT PCR (FLU A&B, COVID)
Influenza A by PCR: NEGATIVE
Influenza B by PCR: NEGATIVE
SARS Coronavirus 2 by RT PCR: NEGATIVE

## 2019-12-16 LAB — GLUCOSE, CAPILLARY: Glucose-Capillary: 116 mg/dL — ABNORMAL HIGH (ref 70–99)

## 2019-12-16 MED ORDER — NITROGLYCERIN 0.4 MG SL SUBL: 0.4000 mg | SUBLINGUAL_TABLET | SUBLINGUAL | Status: DC | PRN

## 2019-12-16 MED ORDER — ENOXAPARIN SODIUM 80 MG/0.8ML ~~LOC~~ SOLN
80.0000 mg | Freq: Two times a day (BID) | SUBCUTANEOUS | Status: DC
  Administered 2019-12-16 – 2019-12-17 (×2): 80 mg via SUBCUTANEOUS
  Filled 2019-12-16 (×3): qty 0.8

## 2019-12-16 MED ORDER — INSULIN ASPART 100 UNIT/ML ~~LOC~~ SOLN
0.0000 [IU] | Freq: Three times a day (TID) | SUBCUTANEOUS | Status: DC
  Administered 2019-12-16 (×2): 1 [IU] via SUBCUTANEOUS
  Filled 2019-12-16 (×2): qty 1

## 2019-12-16 MED ORDER — ACETAMINOPHEN 325 MG PO TABS: 650.0000 mg | ORAL_TABLET | ORAL | Status: DC | PRN

## 2019-12-16 MED ORDER — INSULIN ASPART 100 UNIT/ML ~~LOC~~ SOLN: 0.0000 [IU] | Freq: Every day | SUBCUTANEOUS | Status: DC

## 2019-12-16 MED ORDER — IOHEXOL 350 MG/ML SOLN
75.0000 mL | Freq: Once | INTRAVENOUS | Status: AC | PRN
  Administered 2019-12-16: 75 mL via INTRAVENOUS

## 2019-12-16 MED ORDER — ONDANSETRON HCL 4 MG/2ML IJ SOLN: 4.0000 mg | Freq: Four times a day (QID) | INTRAMUSCULAR | Status: DC | PRN

## 2019-12-16 NOTE — Consult Note (Signed)
Hematology/Oncology Consult note [facility] [phone] [phone]  Patient Care Team: Tracie Harrier, MD as PCP - General (Internal Medicine)   Name of the patient: Allen Chang  [id number]  [birth year]   Date of visit: 12/16/19 REASON FOR COSULTATION:  CLL currently admitted for A. fib History of presenting illness-  86 y.o. male with PMH listed at below who presents to ER for evaluation of chest pain following an meal associated with dizziness and near syncope.. Patient was found to have new onset of atrial fibrillation. Patient has a history of CLL on  acalabrutinib  In the emergency room, patient's EKG showed atrial fibrillation, HR 55. Patient denies any previous history of atrial fibrillation. CTA showed no pulmonary embolism or other acute thoracic abnormalities. Positive for hiatal hernia. Patient was admitted for evaluation of chest pain, management of atrial fibrillation. Hematology oncology consulted. I saw patient at the bedside. He is still in the emergency room at this point. He reports feeling better. Currently no chest pain. Denies any nausea vomiting diarrhea.  Review of Systems  Constitutional: Negative for appetite change, chills, fatigue, fever and unexpected weight change.  HENT:   Negative for hearing loss and voice change.   Eyes: Negative for eye problems and icterus.  Respiratory: Negative for chest tightness, cough and shortness of breath.   Cardiovascular: Positive for chest pain. Negative for leg swelling.  Gastrointestinal: Negative for abdominal distention and abdominal pain.  Endocrine: Negative for hot flashes.  Genitourinary: Negative for difficulty urinating, dysuria and frequency.   Musculoskeletal: Negative for arthralgias.  Skin: Negative for itching and rash.  Neurological: Positive for light-headedness. Negative for numbness.  Hematological: Negative for adenopathy. Does not bruise/bleed easily.   Psychiatric/Behavioral: Negative for confusion.    Allergies  Allergen Reactions  . Oxycodone-Acetaminophen Other (See Comments)    Other reaction(s): Hallucinations Other reaction(s): Other (See Comments) Hallucination Hallucination Hallucination  . Percocet [Oxycodone-Acetaminophen] Other (See Comments)    Hallucination   . Voltaren [Diclofenac Sodium] Palpitations    Patient Active Problem List   Diagnosis Date Noted  . Hiatal hernia 12/16/2019  . Postural dizziness with presyncope 12/16/2019  . New onset Atrial fibrillation with slow ventricular response ([place]) 12/16/2019  . Chest pain 12/16/2019  . Encounter for antineoplastic chemotherapy 10/22/2018  . Goals of care, counseling/discussion 09/28/2018  . Thrombocytopenia ([place]) 10/27/2016  . Iron deficiency 10/27/2016  . CLL (chronic lymphocytic leukemia) ([place]) 10/27/2016  . Macrocytic anemia 10/27/2016  . Acquired hypothyroidism 10/01/2016  . Chronic kidney disease (CKD) stage G3a/A1, moderately decreased glomerular filtration rate (GFR) between 45-59 mL/min/1.73 square meter and albuminuria creatinine ratio less than 30 mg/g (HCC) 10/01/2016  . Status post left partial knee replacement 12/12/2014  . Bradycardia 10/10/2014  . Severe sinus bradycardia 10/10/2014  . Benign essential hypertension 05/02/2013  . Diabetes mellitus ([place]) 05/02/2013  . Hyperlipidemia, unspecified 05/02/2013     Past Medical History:  Diagnosis Date  . Cancer [facility])    prostate   . CKD (chronic kidney disease) stage 3, GFR 30-59 ml/min (HCC) 10/01/2016  . Complication of anesthesia    bradycardia, in ICU for 24 hour after galbladder surgery.   . Diverticulosis 2012  . Dysrhythmia    Heart skips a beat  . Elevated lipids   . GERD (gastroesophageal reflux disease)   . History of hiatal hernia   . Hypertension   . Hypothyroidism   . Leukemia ([place])   . Prostate cancer ([place])   . Skin cancer  face, scalp, behind ear,back and hand  .  Tremors of nervous system      Past Surgical History:  Procedure Laterality Date  . BLADDER TUMOR EXCISION    . CARDIAC CATHETERIZATION    . CHOLECYSTECTOMY N/A 10/10/2014   Procedure: LAPAROSCOPIC CHOLECYSTECTOMY WITH INTRAOPERATIVE CHOLANGIOGRAM;  Surgeon: Leonie Green, MD;  Location: [facility] ORS;  Service: General;  Laterality: N/A;  . COLONOSCOPY WITH PROPOFOL N/A 02/16/2017   Procedure: COLONOSCOPY WITH PROPOFOL;  Surgeon: Manya Silvas, MD;  Location: [facility] ENDOSCOPY;  Service: Endoscopy;  Laterality: N/A;  . ESOPHAGOGASTRODUODENOSCOPY (EGD) WITH PROPOFOL N/A 02/16/2017   Procedure: ESOPHAGOGASTRODUODENOSCOPY (EGD) WITH PROPOFOL;  Surgeon: Manya Silvas, MD;  Location: [facility] ENDOSCOPY;  Service: Endoscopy;  Laterality: N/A;  . HEMORRHOID SURGERY    . HERNIA REPAIR Left    x2  . JOINT REPLACEMENT Left    Partial Knee Replacement, Dr. Roland Rack  . PARTIAL KNEE ARTHROPLASTY Left 12/12/2014   Procedure: UNICOMPARTMENTAL KNEE;  Surgeon: Corky Mull, MD;  Location: [facility] ORS;  Service: Orthopedics;  Laterality: Left;  . prostate seeding    . SHOULDER ARTHROSCOPY Right   . SHOULDER ARTHROSCOPY WITH OPEN ROTATOR CUFF REPAIR Left 02/07/2016   Procedure: SHOULDER ARTHROSCOPY WITH OPEN ROTATOR CUFF REPAIR;  Surgeon: Corky Mull, MD;  Location: [facility] ORS;  Service: Orthopedics;  Laterality: Left;    Social History   Socioeconomic History  . Marital status: Married    Spouse name: Not on file  . Number of children: Not on file  . Years of education: Not on file  . Highest education level: Not on file  Occupational History  . Not on file  Tobacco Use  . Smoking status: Never Smoker  . Smokeless tobacco: Never Used  Vaping Use  . Vaping Use: Never used  Substance and Sexual Activity  . Alcohol use: No  . Drug use: No  . Sexual activity: Yes  Other Topics Concern  . Not on file  Social History Narrative  . Not on file   Social Determinants of Health   Financial  Resource Strain:   . Difficulty of Paying Living Expenses: Not on file  Food Insecurity:   . Worried About Charity fundraiser in the Last Year: Not on file  . Ran Out of Food in the Last Year: Not on file  Transportation Needs:   . Lack of Transportation (Medical): Not on file  . Lack of Transportation (Non-Medical): Not on file  Physical Activity:   . Days of Exercise per Week: Not on file  . Minutes of Exercise per Session: Not on file  Stress:   . Feeling of Stress : Not on file  Social Connections:   . Frequency of Communication with Friends and Family: Not on file  . Frequency of Social Gatherings with Friends and Family: Not on file  . Attends Religious Services: Not on file  . Active Member of Clubs or Organizations: Not on file  . Attends Archivist Meetings: Not on file  . Marital Status: Not on file  Intimate Partner Violence:   . Fear of Current or Ex-Partner: Not on file  . Emotionally Abused: Not on file  . Physically Abused: Not on file  . Sexually Abused: Not on file     Family History  Problem Relation Age of Onset  . Bone cancer Father   . Hypertension Mother   . Osteoporosis Mother   . Breast cancer Sister   . Cancer Brother   .  Cancer Brother   . Lung cancer Brother   . Bladder Cancer Brother   . Melanoma Brother      Current Facility-Administered Medications:  .  acetaminophen (TYLENOL) tablet 650 mg, 650 mg, Oral, Q4H PRN, Judd Gaudier V, MD .  enoxaparin (LOVENOX) injection 80 mg, 80 mg, Subcutaneous, Q12H, Hall, Scott A, RPH .  insulin aspart (novoLOG) injection 0-5 Units, 0-5 Units, Subcutaneous, QHS, Duncan, Hazel V, MD .  insulin aspart (novoLOG) injection 0-9 Units, 0-9 Units, Subcutaneous, TID WC, Athena Masse, MD, 1 Units at 12/16/19 1234 .  nitroGLYCERIN (NITROSTAT) SL tablet 0.4 mg, 0.4 mg, Sublingual, Q5 min PRN, Athena Masse, MD .  ondansetron [facility]) injection 4 mg, 4 mg, Intravenous, Q6H PRN, Athena Masse,  MD  Current Outpatient Medications:  .  acalabrutinib (CALQUENCE) 100 MG capsule, TAKE 1 CAPSULE BY MOUTH EVERY 12 HOURS, Disp: 60 capsule, Rfl: 3 .  acetaminophen (TYLENOL) 500 MG tablet, Take 1,000 mg by mouth every 8 (eight) hours as needed for moderate pain., Disp: , Rfl:  .  Apoaequorin (PREVAGEN PO), Take 1 tablet by mouth daily., Disp: , Rfl:  .  aspirin 81 MG tablet, Take 81 mg by mouth daily. In am, Disp: , Rfl:  .  b complex vitamins capsule, Take 1 capsule by mouth daily., Disp: , Rfl:  .  calcium carbonate (OSCAL) 1500 (600 Ca) MG TABS tablet, Take by mouth daily with breakfast., Disp: , Rfl:  .  Cholecalciferol 25 MCG (1000 UT) tablet, Take by mouth., Disp: , Rfl:  .  DULoxetine (CYMBALTA) 20 MG capsule, Take 20 mg by mouth daily., Disp: , Rfl:  .  famotidine (PEPCID) 40 MG tablet, Take 40 mg by mouth 2 (two) times daily., Disp: , Rfl:  .  ferrous sulfate 324 MG TBEC, Take 324 mg by mouth 3 (three) times daily. , Disp: , Rfl:  .  Flaxseed, Linseed, (FLAX SEED OIL) 1000 MG CAPS, Take 1 capsule by mouth daily., Disp: , Rfl:  .  gabapentin (NEURONTIN) 100 MG capsule, Take 100 mg by mouth 3 (three) times daily. , Disp: , Rfl:  .  Garlic 0109 MG CAPS, Take 1 capsule by mouth every morning., Disp: , Rfl:  .  latanoprost (XALATAN) 0.005 % ophthalmic solution, Place 1 drop into both eyes at bedtime., Disp: , Rfl:  .  levothyroxine (SYNTHROID, LEVOTHROID) 25 MCG tablet, Take 25 mcg by mouth daily at 12 noon., Disp: , Rfl:  .  lisinopril (PRINIVIL,ZESTRIL) 20 MG tablet, Take 10 mg by mouth daily. In am, Disp: , Rfl:  .  loratadine (CLARITIN REDITABS) 10 MG dissolvable tablet, Take 10 mg by mouth daily. In am, Disp: , Rfl:  .  Misc Natural Products (BLACK CHERRY CONCENTRATE) LIQD, Take 15 mLs by mouth daily. , Disp: , Rfl:  .  Multiple Vitamins-Minerals (MULTIVITAMIN WITH MINERALS) tablet, Take 1 tablet by mouth daily. In am, Disp: , Rfl:  .  Omega-3 Fatty Acids (FISH OIL) 1000 MG CAPS, Take  1 capsule by mouth daily., Disp: , Rfl:  .  propranolol (INDERAL) 10 MG tablet, Take 10 mg by mouth 2 (two) times daily., Disp: , Rfl:    Physical exam:  Vitals:   12/16/19 1300 12/16/19 1400 12/16/19 1430 12/16/19 1500  BP: (!) 100/59 129/74 112/63 (!) 98/57  Pulse: 60 62 (!) 59 62  Resp: 20 17 (!) 21 20  Temp:      TempSrc:      SpO2: 100% 100% 100%  100%  Weight:      Height:       Physical Exam Constitutional:      General: He is not in acute distress.    Appearance: He is not diaphoretic.  HENT:     Head: Normocephalic and atraumatic.     Nose: Nose normal.     Mouth/Throat:     Pharynx: No oropharyngeal exudate.  Eyes:     General: No scleral icterus.    Pupils: Pupils are equal, round, and reactive to light.  Cardiovascular:     Rate and Rhythm: Normal rate and regular rhythm.     Heart sounds: No murmur heard.   Pulmonary:     Effort: Pulmonary effort is normal. No respiratory distress.     Breath sounds: No rales.  Chest:     Chest wall: No tenderness.  Abdominal:     General: There is no distension.     Palpations: Abdomen is soft.     Tenderness: There is no abdominal tenderness.  Musculoskeletal:        General: Normal range of motion.     Cervical back: Normal range of motion and neck supple.  Skin:    General: Skin is warm and dry.     Findings: No erythema.  Neurological:     Mental Status: He is alert and oriented to person, place, and time.     Cranial Nerves: No cranial nerve deficit.     Motor: No abnormal muscle tone.     Coordination: Coordination normal.  Psychiatric:        Mood and Affect: Affect normal.         CMP Latest Ref Rng & Units 12/15/2019  Glucose 70 - 99 mg/dL 152(H)  BUN 8 - 23 mg/dL 36(H)  Creatinine 0.61 - 1.24 mg/dL 1.48(H)  Sodium 135 - 145 mmol/L 139  Potassium 3.5 - 5.1 mmol/L 4.3  Chloride 98 - 111 mmol/L 104  CO2 22 - 32 mmol/L 25  Calcium 8.9 - 10.3 mg/dL 10.2  Total Protein 6.5 - 8.1 g/dL 7.2  Total  Bilirubin 0.3 - 1.2 mg/dL 0.7  Alkaline Phos 38 - 126 U/L 65  AST 15 - 41 U/L 21  ALT 0 - 44 U/L 14   CBC Latest Ref Rng & Units 12/15/2019  WBC 4.0 - 10.5 K/uL 6.6  Hemoglobin 13.0 - 17.0 g/dL 12.7(L)  Hematocrit 39 - 52 % 37.2(L)  Platelets 150 - 400 K/uL 129(L)    RADIOGRAPHIC STUDIES: I have personally reviewed the radiological images as listed and agreed with the findings in the report.   Assessment and plan-   #CLL, patient has been on acalabrutinib and CLL has been controlled well.  Labs are reviewed and discussed with patient. Stable white count and chronic thrombocytopenia.  Advise patient to stop acalabrutinib as it may increase risk of A Fib. He will follow up with me outpatient for further discussion of CLL treatment options.  #  #New onset of atrial fibrillation, Possible secondary to acalabrutinib versus other cardiology etiology.   Appreciate cardiology input regarding need of long-term anticoagulation.  No restrictions off anticoagulation from recommend noncontrast back. #Chest pain, pending cardiology work up   Thank you for allowing me to participate in the care of this patient.   [name], MD, PhD Hematology Oncology [facility] Pager- [phone] 12/16/2019

## 2019-12-16 NOTE — ED Notes (Signed)
Pt updated on plan of care, awaiting CT and admission orders

## 2019-12-16 NOTE — ED Notes (Signed)
Pt provided with breakfast. Eating at this time. NAD noted.

## 2019-12-16 NOTE — ED Notes (Signed)
Echo at bedside at this time

## 2019-12-16 NOTE — ED Notes (Signed)
Pt denies CP at this time. Pt laying in bed comfortably. RR even and unlabored.

## 2019-12-16 NOTE — ED Provider Notes (Signed)
[facility] Emergency Department Provider Note  ____________________________________________   First MD Initiated Contact with Patient 12/16/19 0037     (approximate)  I have reviewed the triage vital signs and the nursing notes.   HISTORY  Chief Complaint Chest Pain    HPI Allen Chang is a 86 y.o. male with a list of previous medical conditions including leukemia prostate cancer hypertension hypothyroidism, diverticulosis presents to the emergency department secondary to acute onset of central chest pain, palpitations and near syncope while eating dinner tonight.  Patient states that symptoms lasted approximately 15 minutes followed by spontaneous resolution.  Patient denies any symptoms at present.  Patient denies any loss of consciousness at the time however does admit to sensation that he was going to lose consciousness and dizziness.        Past Medical History:  Diagnosis Date  . Cancer [facility])    prostate   . Complication of anesthesia    bradycardia, in ICU for 24 hour after galbladder surgery.   . Diverticulosis 2012  . Dysrhythmia    Heart skips a beat  . Elevated lipids   . GERD (gastroesophageal reflux disease)   . History of hiatal hernia   . Hypertension   . Hypothyroidism   . Leukemia ([place])   . Prostate cancer ([place])   . Skin cancer    face, scalp, behind ear,back and hand  . Tremors of nervous system     Patient Active Problem List   Diagnosis Date Noted  . Encounter for antineoplastic chemotherapy 10/22/2018  . Goals of care, counseling/discussion 09/28/2018  . Thrombocytopenia ([place]) 10/27/2016  . Iron deficiency 10/27/2016  . CLL (chronic lymphocytic leukemia) ([place]) 10/27/2016  . Macrocytic anemia 10/27/2016  . Status post left partial knee replacement 12/12/2014  . Bradycardia 10/10/2014  . Severe sinus bradycardia 10/10/2014    Past Surgical History:  Procedure Laterality Date  . BLADDER TUMOR EXCISION    .  CARDIAC CATHETERIZATION    . CHOLECYSTECTOMY N/A 10/10/2014   Procedure: LAPAROSCOPIC CHOLECYSTECTOMY WITH INTRAOPERATIVE CHOLANGIOGRAM;  Surgeon: Leonie Green, MD;  Location: [facility] ORS;  Service: General;  Laterality: N/A;  . COLONOSCOPY WITH PROPOFOL N/A 02/16/2017   Procedure: COLONOSCOPY WITH PROPOFOL;  Surgeon: Manya Silvas, MD;  Location[facility] ENDOSCOPY;  Service: Endoscopy;  Laterality: N/A;  . ESOPHAGOGASTRODUODENOSCOPY (EGD) WITH PROPOFOL N/A 02/16/2017   Procedure: ESOPHAGOGASTRODUODENOSCOPY (EGD) WITH PROPOFOL;  Surgeon: Manya Silvas, MD;  Location: [facility] ENDOSCOPY;  Service: Endoscopy;  Laterality: N/A;  . HEMORRHOID SURGERY    . HERNIA REPAIR Left    x2  . JOINT REPLACEMENT Left    Partial Knee Replacement, Dr. Roland Rack  . PARTIAL KNEE ARTHROPLASTY Left 12/12/2014   Procedure: UNICOMPARTMENTAL KNEE;  Surgeon: Corky Mull, MD;  Location: [facility] ORS;  Service: Orthopedics;  Laterality: Left;  . prostate seeding    . SHOULDER ARTHROSCOPY Right   . SHOULDER ARTHROSCOPY WITH OPEN ROTATOR CUFF REPAIR Left 02/07/2016   Procedure: SHOULDER ARTHROSCOPY WITH OPEN ROTATOR CUFF REPAIR;  Surgeon: Corky Mull, MD;  Location: [facility] ORS;  Service: Orthopedics;  Laterality: Left;    Prior to Admission medications   Medication Sig Start Date End Date Taking? Authorizing Provider  acalabrutinib (CALQUENCE) 100 MG capsule TAKE 1 CAPSULE BY MOUTH EVERY 12 HOURS 11/10/19   [name], MD  acetaminophen (TYLENOL) 500 MG tablet Take by mouth. Patient not taking: Reported on 11/10/2019    [provider]  Apoaequorin (PREVAGEN PO) Take 1  tablet by mouth daily.    [provider]  aspirin 81 MG tablet Take 81 mg by mouth daily. In am    [provider]  b complex vitamins capsule Take 1 capsule by mouth daily.    [provider]  Cholecalciferol 25 MCG (1000 UT) tablet Take by mouth.    [provider]  DULoxetine (CYMBALTA) 20 MG capsule Take 20 mg  by mouth daily. 06/08/19   [provider]  famotidine (PEPCID) 40 MG tablet Take 40 mg by mouth 2 (two) times daily. 04/28/19   [provider]  ferrous sulfate 324 MG TBEC Take 324 mg by mouth daily with breakfast.     [provider]  Flaxseed, Linseed, (FLAX SEED OIL) 1000 MG CAPS Take 1 capsule by mouth daily.    [provider]  gabapentin (NEURONTIN) 100 MG capsule Take 100 mg by mouth 2 (two) times daily. 02/15/18 11/10/19  [provider]  Garlic 1962 MG CAPS Take 1 capsule by mouth every morning.    [provider]  latanoprost (XALATAN) 0.005 % ophthalmic solution Place 1 drop into both eyes at bedtime.    [provider]  levothyroxine (SYNTHROID, LEVOTHROID) 25 MCG tablet Take 25 mcg by mouth daily at 12 noon. 04/02/16   [provider]  lisinopril (PRINIVIL,ZESTRIL) 20 MG tablet Take 10 mg by mouth daily. In am    [provider]  loratadine (CLARITIN REDITABS) 10 MG dissolvable tablet Take 10 mg by mouth daily. In am    [provider]  Misc Natural Products (BLACK CHERRY CONCENTRATE) LIQD Take 15 mLs by mouth daily.     [provider]  Multiple Vitamins-Minerals (MULTIVITAMIN WITH MINERALS) tablet Take 1 tablet by mouth daily. In am    [provider]  Omega-3 Fatty Acids (FISH OIL) 1000 MG CAPS Take 1 capsule by mouth daily.    [provider]  propranolol (INDERAL) 10 MG tablet Take 10 mg by mouth 2 (two) times daily. 05/18/19   [provider]  traMADol (ULTRAM) 50 MG tablet Take 50 mg by mouth every 6 (six) hours as needed.  Patient not taking: Reported on 11/10/2019 08/13/18   [provider]    Allergies Oxycodone-acetaminophen, Percocet [oxycodone-acetaminophen], and Voltaren [diclofenac sodium]  Family History  Problem Relation Age of Onset  . Bone cancer Father   . Hypertension Mother   . Osteoporosis Mother   . Breast cancer Sister   .  Cancer Brother   . Cancer Brother   . Lung cancer Brother   . Bladder Cancer Brother   . Melanoma Brother     Social History Social History   Tobacco Use  . Smoking status: Never Smoker  . Smokeless tobacco: Never Used  Vaping Use  . Vaping Use: Never used  Substance Use Topics  . Alcohol use: No  . Drug use: No    Review of Systems Constitutional: No fever/chills Eyes: No visual changes. ENT: No sore throat. Cardiovascular: Positive for chest pain. Respiratory: Today for shortness of breath. Gastrointestinal: No abdominal pain.  No nausea, no vomiting.  No diarrhea.  No constipation. Genitourinary: Negative for dysuria. Musculoskeletal: Negative for neck pain.  Negative for back pain. Integumentary: Negative for rash. Neurological: Negative for headaches, focal weakness or numbness.  Positive for dizziness   ____________________________________________   PHYSICAL EXAM:  VITAL SIGNS: ED Triage Vitals  Enc Vitals Group     BP 12/15/19 2002 137/75  Pulse Rate 12/15/19 2002 66     Resp 12/15/19 2002 16     Temp 12/15/19 2002 98.7 F (37.1 C)     Temp Source 12/15/19 2002 Oral     SpO2 12/15/19 1953 98 %     Weight 12/15/19 2003 79.3 kg (174 lb 13.2 oz)     Height 12/15/19 2003 1.765 m (5' 9.5")     Head Circumference --      Peak Flow --      Pain Score 12/15/19 2003 0     Pain Loc --      Pain Edu? --      Excl. in [place]? --     Constitutional: Alert and oriented.  Eyes: Conjunctivae are normal.  Head: Atraumatic. Mouth/Throat: Patient is wearing a mask. Neck: No stridor.  No meningeal signs.   Cardiovascular: Normal rate, irregular rhythm. Good peripheral circulation. Grossly normal heart sounds. Respiratory: Normal respiratory effort.  No retractions. Gastrointestinal: Soft and nontender. No distention.  Musculoskeletal: No lower extremity tenderness nor edema. No gross deformities of extremities. Neurologic:  Normal speech and language. No gross  focal neurologic deficits are appreciated.  Skin:  Skin is warm, dry and intact. Psychiatric: Mood and affect are normal. Speech and behavior are normal.  ____________________________________________   LABS (all labs ordered are listed, but only abnormal results are displayed)  Labs Reviewed  CBC WITH DIFFERENTIAL/PLATELET - Abnormal; Notable for the following components:      Result Value   RBC 3.53 (*)    Hemoglobin 12.7 (*)    HCT 37.2 (*)    MCV 105.4 (*)    MCH 36.0 (*)    Platelets 129 (*)    Abs Immature Granulocytes 0.09 (*)    All other components within normal limits  COMPREHENSIVE METABOLIC PANEL - Abnormal; Notable for the following components:   Glucose, Bld 152 (*)    BUN 36 (*)    Creatinine, Ser 1.48 (*)    GFR, Estimated 46 (*)    All other components within normal limits  TROPONIN I (HIGH SENSITIVITY)  TROPONIN I (HIGH SENSITIVITY)   ____________________________________________  EKG  ED ECG REPORT I, [place] N Mearle Drew, the attending physician, personally viewed and interpreted this ECG.   Date: 12/16/2019  EKG Time: 7:54 AM  Rate: 63  Rhythm: Atrial fibrillation  Axis: Normal  Intervals: Irregular RR interval  ST&T Change: None   ED ECG REPORT I, [place] N Shakela Donati, the attending physician, personally viewed and interpreted this ECG.   Date: 12/16/2019  EKG Time: 12:48 AM  Rate: 55  Rhythm: Atrial fibrillation  Axis: Normal  Intervals: Irregular RR interval  ST&T Change: None  ____________________________________________  RADIOLOGY I,  N Dyllan Hughett, personally viewed and evaluated these images (plain radiographs) as part of my medical decision making, as well as reviewing the written report by the radiologist.  ED MD interpretation: Large hiatal hernia on chest x-ray per radiologist  Official radiology report(s): DG Chest 2 View  Result Date: 12/15/2019 CLINICAL DATA:  Upper chest pain which began after eating supper EXAM: CHEST - 2  VIEW COMPARISON:  Esophagram 04/13/2019, radiograph 03/31/2017, CT 07/09/2006 FINDINGS: Large air and fluid containing hiatal hernia which appears similar to most recent to soften g in February though certainly increased in size since 2019. The aorta is calcified. The remaining cardiomediastinal contours are unremarkable. Streaky opacities in the lung bases likely reflect areas atelectatic changes adjacent the hiatal hernia. Additional chronically coarsened interstitial changes are present with  apical lucency suggesting some emphysematous changes present comparison CT. No convincing features of edema. No pneumothorax or effusion. No acute osseous or soft tissue abnormality. Degenerative changes are present in the imaged spine and shoulders. IMPRESSION: 1. Large hiatal hernia which appears similar to comparison esophagram from February 2021 though increased in size from more remote comparison is. 2. Streaky opacities in the bases, likely atelectasis and/or scarring. 3. No other acute cardiopulmonary abnormality. 4.  Aortic Atherosclerosis (ICD10-I70.0). Electronically Signed   By: Lovena Le M.D.   On: 12/15/2019 20:18      Procedures   ____________________________________________   INITIAL IMPRESSION / MDM / ASSESSMENT AND PLAN / ED COURSE  As part of my medical decision making, I reviewed the following data within the [place] NUMBER   86 year old male presented with above-stated history and physical exam with differential diagnosis including but not limited to arrhythmia, ACS, pulmonary emboli.  GERD patient's evaluation patient noted to be in atrial fibrillation on the cardiac monitor repeat EKG consistent with the same.  Patient has no previous history of atrial fibrillation.  Patient discussed with Dr. Damita Dunnings for hospital admission for further evaluation and management.  ____________________________________________  FINAL CLINICAL IMPRESSION(S) / ED DIAGNOSES  Final diagnoses:   Atrial fibrillation, unspecified type ([place])  Chest pain, unspecified type  Near syncope     MEDICATIONS GIVEN DURING THIS VISIT:  Medications - No data to display   ED Discharge Orders    None      *Please note:  Erik A Archibeque was evaluated in Emergency Department on 12/16/2019 for the symptoms described in the history of present illness. He was evaluated in the context of the global COVID-19 pandemic, which necessitated consideration that the patient might be at risk for infection with the SARS-CoV-2 virus that causes COVID-19. Institutional protocols and algorithms that pertain to the evaluation of patients at risk for COVID-19 are in a state of rapid change based on information released by regulatory bodies including the CDC and federal and state organizations. These policies and algorithms were followed during the patient's care in the ED.  Some ED evaluations and interventions may be delayed as a result of limited staffing during and after the pandemic.*  Note:  This document was prepared using Dragon voice recognition software and may include unintentional dictation errors.   Gregor Hams, MD 12/16/19 [phone]

## 2019-12-16 NOTE — Progress Notes (Signed)
ANTICOAGULATION CONSULT NOTE - Initial Consult  Pharmacy Consult for Lovenox Indication: atrial fibrillation  Allergies  Allergen Reactions  . Oxycodone-Acetaminophen Other (See Comments)    Other reaction(s): Hallucinations Other reaction(s): Other (See Comments) Hallucination Hallucination Hallucination  . Percocet [Oxycodone-Acetaminophen] Other (See Comments)    Hallucination   . Voltaren [Diclofenac Sodium] Palpitations    Patient Measurements: Height: 5' 9.5" (176.5 cm) Weight: 79.3 kg (174 lb 13.2 oz) IBW/kg (Calculated) : 71.85  Vital Signs: Temp: 98.7 F (37.1 C) (10/28 2002) Temp Source: Oral (10/28 2002) BP: 123/74 (10/29 0438) Pulse Rate: 63 (10/29 0438)  Labs: Recent Labs    12/15/19 2006 12/15/19 2212  HGB 12.7*  --   HCT 37.2*  --   PLT 129*  --   CREATININE 1.48*  --   TROPONINIHS 9 8    Estimated Creatinine Clearance: 37.1 mL/min (A) (by C-G formula based on SCr of 1.48 mg/dL (H)).   Medical History: Past Medical History:  Diagnosis Date  . Cancer [facility])    prostate   . CKD (chronic kidney disease) stage 3, GFR 30-59 ml/min (HCC) 10/01/2016  . Complication of anesthesia    bradycardia, in ICU for 24 hour after galbladder surgery.   . Diverticulosis 2012  . Dysrhythmia    Heart skips a beat  . Elevated lipids   . GERD (gastroesophageal reflux disease)   . History of hiatal hernia   . Hypertension   . Hypothyroidism   . Leukemia ([place])   . Prostate cancer ([place])   . Skin cancer    face, scalp, behind ear,back and hand  . Tremors of nervous system     Medications:  (Not in a hospital admission)   Assessment: 86 y.o. male with a list of previous medical conditions including leukemia prostate cancer hypertension hypothyroidism, diverticulosis presents to the emergency department secondary to acute onset of central chest pain, palpitations and near syncope while eating dinner tonight.   Goal of Therapy:  Anti-Xa level 0.6-1 units/ml  4hrs after LMWH dose given Monitor platelets by anticoagulation protocol: Yes   Plan:  Lovenox 1mg /Kg SQ q12hrs  Aziz Slape A 12/16/2019,5:24 AM

## 2019-12-16 NOTE — H&P (Signed)
History and Physical    Allen Chang NFA:[id number] DOB: Oct 24, 1932 DOA: 12/16/2019  PCP: Tracie Harrier, MD   Patient coming from:   I have personally briefly reviewed patient's old medical records in [place]  Chief Complaint: chest pain, lightheadedness  HPI: Allen Chang is a 86 y.o. male with medical history significant for Type 2 diabetes, HTN, hypothyroidism, CKD 3a, CLL on acalabrutinib, followed by Dr. Tasia Catchings, thrombocytopenia who presents to the emergency room with sudden onset chest pain following a meal associated with dizziness and near syncope.  Patient was previously in his usual state of health and developed acute retrosternal chest pain 10 out of 10 after supper.  Pain radiated across chest from left to right.  His family gave him 4 baby aspirin with resolution of the pain.  Patient felt lightheaded during the pain and felt as though he was about to pass out.  He had no associated nausea, vomiting or diaphoresis.  Had no cough or shortness of breath.  No complaints of leg pain or swelling.  His symptoms have completely resolved. ED course: On arrival he was afebrile with heart rate in the mid 50s and otherwise normal vitals.  EKG showed A. fib rate of 55.  No prior history of A. fib.  Troponin 9>>8.  CMP unremarkable with creatinine of 1.48 which is at baseline.  CBC with WBC 6600 hemoglobin 12.8, platelets 129, normal sinus thrombocytopenia. CTA chest showed no PE or other acute thoracic abnormality but did show a large hiatal hernia hospitalist consulted for admission EKG as reviewed by me : A. fib with slow ventricular response of 55  Review of Systems: As per HPI otherwise all other systems on review of systems negative.    Past Medical History:  Diagnosis Date  . Cancer [facility])    prostate   . CKD (chronic kidney disease) stage 3, GFR 30-59 ml/min (HCC) 10/01/2016  . Complication of anesthesia    bradycardia, in ICU for 24 hour after galbladder surgery.    . Diverticulosis 2012  . Dysrhythmia    Heart skips a beat  . Elevated lipids   . GERD (gastroesophageal reflux disease)   . History of hiatal hernia   . Hypertension   . Hypothyroidism   . Leukemia ([place])   . Prostate cancer ([facility])   . Skin cancer    face, scalp, behind ear,back and hand  . Tremors of nervous system     Past Surgical History:  Procedure Laterality Date  . BLADDER TUMOR EXCISION    . CARDIAC CATHETERIZATION    . CHOLECYSTECTOMY N/A 10/10/2014   Procedure: LAPAROSCOPIC CHOLECYSTECTOMY WITH INTRAOPERATIVE CHOLANGIOGRAM;  Surgeon: Leonie Green, MD;  Location: [facility] ORS;  Service: General;  Laterality: N/A;  . COLONOSCOPY WITH PROPOFOL N/A 02/16/2017   Procedure: COLONOSCOPY WITH PROPOFOL;  Surgeon: Manya Silvas, MD;  Location: [facility] ENDOSCOPY;  Service: Endoscopy;  Laterality: N/A;  . ESOPHAGOGASTRODUODENOSCOPY (EGD) WITH PROPOFOL N/A 02/16/2017   Procedure: ESOPHAGOGASTRODUODENOSCOPY (EGD) WITH PROPOFOL;  Surgeon: Manya Silvas, MD;  Location: [facility] ENDOSCOPY;  Service: Endoscopy;  Laterality: N/A;  . HEMORRHOID SURGERY    . HERNIA REPAIR Left    x2  . JOINT REPLACEMENT Left    Partial Knee Replacement, Dr. Roland Rack  . PARTIAL KNEE ARTHROPLASTY Left 12/12/2014   Procedure: UNICOMPARTMENTAL KNEE;  Surgeon: Corky Mull, MD;  Location: [facility] ORS;  Service: Orthopedics;  Laterality: Left;  . prostate seeding    . SHOULDER ARTHROSCOPY Right   .  SHOULDER ARTHROSCOPY WITH OPEN ROTATOR CUFF REPAIR Left 02/07/2016   Procedure: SHOULDER ARTHROSCOPY WITH OPEN ROTATOR CUFF REPAIR;  Surgeon: Corky Mull, MD;  Location: [facility] ORS;  Service: Orthopedics;  Laterality: Left;     reports that he has never smoked. He has never used smokeless tobacco. He reports that he does not drink alcohol and does not use drugs.  Allergies  Allergen Reactions  . Oxycodone-Acetaminophen Other (See Comments)    Other reaction(s): Hallucinations Other reaction(s): Other (See  Comments) Hallucination Hallucination Hallucination  . Percocet [Oxycodone-Acetaminophen] Other (See Comments)    Hallucination   . Voltaren [Diclofenac Sodium] Palpitations    Family History  Problem Relation Age of Onset  . Bone cancer Father   . Hypertension Mother   . Osteoporosis Mother   . Breast cancer Sister   . Cancer Brother   . Cancer Brother   . Lung cancer Brother   . Bladder Cancer Brother   . Melanoma Brother       Prior to Admission medications   Medication Sig Start Date End Date Taking? Authorizing Provider  acalabrutinib (CALQUENCE) 100 MG capsule TAKE 1 CAPSULE BY MOUTH EVERY 12 HOURS 11/10/19   [name], MD  acetaminophen (TYLENOL) 500 MG tablet Take by mouth. Patient not taking: Reported on 11/10/2019    [provider]  Apoaequorin (PREVAGEN PO) Take 1 tablet by mouth daily.    [provider]  aspirin 81 MG tablet Take 81 mg by mouth daily. In am    [provider]  b complex vitamins capsule Take 1 capsule by mouth daily.    [provider]  Cholecalciferol 25 MCG (1000 UT) tablet Take by mouth.    [provider]  DULoxetine (CYMBALTA) 20 MG capsule Take 20 mg by mouth daily. 06/08/19   [provider]  famotidine (PEPCID) 40 MG tablet Take 40 mg by mouth 2 (two) times daily. 04/28/19   [provider]  ferrous sulfate 324 MG TBEC Take 324 mg by mouth daily with breakfast.     [provider]  Flaxseed, Linseed, (FLAX SEED OIL) 1000 MG CAPS Take 1 capsule by mouth daily.    [provider]  gabapentin (NEURONTIN) 100 MG capsule Take 100 mg by mouth 2 (two) times daily. 02/15/18 11/10/19  [provider]  Garlic 5427 MG CAPS Take 1 capsule by mouth every morning.    [provider]  latanoprost (XALATAN) 0.005 % ophthalmic solution Place 1 drop into both eyes at bedtime.    [provider]  levothyroxine (SYNTHROID, LEVOTHROID) 25 MCG tablet Take 25  mcg by mouth daily at 12 noon. 04/02/16   [provider]  lisinopril (PRINIVIL,ZESTRIL) 20 MG tablet Take 10 mg by mouth daily. In am    [provider]  loratadine (CLARITIN REDITABS) 10 MG dissolvable tablet Take 10 mg by mouth daily. In am    [provider]  Misc Natural Products (BLACK CHERRY CONCENTRATE) LIQD Take 15 mLs by mouth daily.     [provider]  Multiple Vitamins-Minerals (MULTIVITAMIN WITH MINERALS) tablet Take 1 tablet by mouth daily. In am    [provider]  Omega-3 Fatty Acids (FISH OIL) 1000 MG CAPS Take 1 capsule by mouth daily.    [provider]  propranolol (INDERAL) 10 MG tablet Take 10 mg by mouth 2 (two) times daily. 05/18/19   [provider]  traMADol (ULTRAM) 50 MG tablet Take 50 mg by mouth every 6 (  six) hours as needed.  Patient not taking: Reported on 11/10/2019 08/13/18   [provider]    Physical Exam: Vitals:   12/15/19 2002 12/15/19 2003 12/15/19 2213 12/16/19 0200  BP: 137/75  (!) 130/56 139/80  Pulse: 66  (!) 56   Resp: 16  16 15   Temp: 98.7 F (37.1 C)     TempSrc: Oral     SpO2: 93%  99% 98%  Weight:  79.3 kg    Height:  5' 9.5" (1.765 m)       Vitals:   12/15/19 2002 12/15/19 2003 12/15/19 2213 12/16/19 0200  BP: 137/75  (!) 130/56 139/80  Pulse: 66  (!) 56   Resp: 16  16 15   Temp: 98.7 F (37.1 C)     TempSrc: Oral     SpO2: 93%  99% 98%  Weight:  79.3 kg    Height:  5' 9.5" (1.765 m)        Constitutional: Alert and oriented x 3 . Not in any apparent distress HEENT:      Head: Normocephalic and atraumatic.         Eyes: PERLA, EOMI, Conjunctivae are normal. Sclera is non-icteric.       Mouth/Throat: Mucous membranes are moist.       Neck: Supple with no signs of meningismus. Cardiovascular: Regular rate and rhythm. No murmurs, gallops, or rubs. 2+ symmetrical distal pulses are present . No JVD. No LE edema Respiratory: Respiratory effort normal .Lungs  sounds clear bilaterally. No wheezes, crackles, or rhonchi.  Gastrointestinal: Soft, non tender, and non distended with positive bowel sounds. No rebound or guarding. Genitourinary: No CVA tenderness. Musculoskeletal: Nontender with normal range of motion in all extremities. No cyanosis, or erythema of extremities. Neurologic:  Face is symmetric. Moving all extremities. No gross focal neurologic deficits . Skin: Skin is warm, dry.  No rash or ulcers Psychiatric: Mood and affect are normal    Labs on Admission: I have personally reviewed following labs and imaging studies  CBC: Recent Labs  Lab 12/15/19 2006  WBC 6.6  NEUTROABS 3.7  HGB 12.7*  HCT 37.2*  MCV 105.4*  PLT 711*   Basic Metabolic Panel: Recent Labs  Lab 12/15/19 2006  NA 139  K 4.3  CL 104  CO2 25  GLUCOSE 152*  BUN 36*  CREATININE 1.48*  CALCIUM 10.2   GFR: Estimated Creatinine Clearance: 37.1 mL/min (A) (by C-G formula based on SCr of 1.48 mg/dL (H)). Liver Function Tests: Recent Labs  Lab 12/15/19 2006  AST 21  ALT 14  ALKPHOS 65  BILITOT 0.7  PROT 7.2  ALBUMIN 3.8   No results for input(s): LIPASE, AMYLASE in the last 168 hours. No results for input(s): AMMONIA in the last 168 hours. Coagulation Profile: No results for input(s): INR, PROTIME in the last 168 hours. Cardiac Enzymes: No results for input(s): CKTOTAL, CKMB, CKMBINDEX, TROPONINI in the last 168 hours. BNP (last 3 results) No results for input(s): PROBNP in the last 8760 hours. HbA1C: No results for input(s): HGBA1C in the last 72 hours. CBG: No results for input(s): GLUCAP in the last 168 hours. Lipid Profile: No results for input(s): CHOL, HDL, LDLCALC, TRIG, CHOLHDL, LDLDIRECT in the last 72 hours. Thyroid Function Tests: No results for input(s): TSH, T4TOTAL, FREET4, T3FREE, THYROIDAB in the last 72 hours. Anemia Panel: No results for input(s): VITAMINB12, FOLATE, FERRITIN, TIBC, IRON, RETICCTPCT in the last 72  hours. Urine analysis:    Component Value  Date/Time   COLORURINE YELLOW (A) 03/31/2017 2210   APPEARANCEUR HAZY (A) 03/31/2017 2210   APPEARANCEUR Clear 05/22/2013 0646   LABSPEC 1.019 03/31/2017 2210   LABSPEC 1.016 05/22/2013 0646   PHURINE 5.0 03/31/2017 2210   GLUCOSEU NEGATIVE 03/31/2017 2210   GLUCOSEU Negative 05/22/2013 0646   HGBUR NEGATIVE 03/31/2017 2210   BILIRUBINUR NEGATIVE 03/31/2017 2210   BILIRUBINUR Negative 05/22/2013 0646   KETONESUR 5 (A) 03/31/2017 2210   PROTEINUR 30 (A) 03/31/2017 2210   NITRITE NEGATIVE 03/31/2017 2210   LEUKOCYTESUR NEGATIVE 03/31/2017 2210   LEUKOCYTESUR Negative 05/22/2013 0646    Radiological Exams on Admission: DG Chest 2 View  Result Date: 12/15/2019 CLINICAL DATA:  Upper chest pain which began after eating supper EXAM: CHEST - 2 VIEW COMPARISON:  Esophagram 04/13/2019, radiograph 03/31/2017, CT 07/09/2006 FINDINGS: Large air and fluid containing hiatal hernia which appears similar to most recent to soften g in February though certainly increased in size since 2019. The aorta is calcified. The remaining cardiomediastinal contours are unremarkable. Streaky opacities in the lung bases likely reflect areas atelectatic changes adjacent the hiatal hernia. Additional chronically coarsened interstitial changes are present with apical lucency suggesting some emphysematous changes present comparison CT. No convincing features of edema. No pneumothorax or effusion. No acute osseous or soft tissue abnormality. Degenerative changes are present in the imaged spine and shoulders. IMPRESSION: 1. Large hiatal hernia which appears similar to comparison esophagram from February 2021 though increased in size from more remote comparison is. 2. Streaky opacities in the bases, likely atelectasis and/or scarring. 3. No other acute cardiopulmonary abnormality. 4.  Aortic Atherosclerosis (ICD10-I70.0). Electronically Signed   By: Lovena Le M.D.   On: 12/15/2019  20:18   CT Angio Chest PE W and/or Wo Contrast  Result Date: 12/16/2019 CLINICAL DATA:  New onset atrial fibrillation. Dyspnea and chest pain EXAM: CT ANGIOGRAPHY CHEST WITH CONTRAST TECHNIQUE: Multidetector CT imaging of the chest was performed using the standard protocol during bolus administration of intravenous contrast. Multiplanar CT image reconstructions and MIPs were obtained to evaluate the vascular anatomy. CONTRAST:  28mL OMNIPAQUE IOHEXOL 350 MG/ML SOLN COMPARISON:  None. FINDINGS: Cardiovascular: Contrast injection is sufficient to demonstrate satisfactory opacification of the pulmonary arteries to the segmental level. There is no pulmonary embolus or evidence of right heart strain. The size of the main pulmonary artery is normal. Heart size is normal, with no pericardial effusion. The course and caliber of the aorta are normal. There is mild atherosclerotic calcification. Opacification decreased due to pulmonary arterial phase contrast bolus timing. Mediastinum/Nodes: Large hiatal hernia.  No adenopathy. Lungs/Pleura: Airways are patent. No pleural effusion, lobar consolidation, pneumothorax or pulmonary infarction. Upper Abdomen: Contrast bolus timing is not optimized for evaluation of the abdominal organs. The visualized portions of the organs of the upper abdomen are normal. Musculoskeletal: No chest wall abnormality. No bony spinal canal stenosis. Review of the MIP images confirms the above findings. IMPRESSION: 1. No pulmonary embolus or other acute thoracic abnormality. 2. Large hiatal hernia. 3.  Aortic atherosclerosis (ICD10-I70.0). Electronically Signed   By: Ulyses Jarred M.D.   On: 12/16/2019 02:58     Assessment/Plan 86 year old male with history of type 2 diabetes, HTN, hypothyroidism, CKD 3a, CLL on acalabrutinib, followed by Dr. Tasia Catchings, thrombocytopenia presenting with sudden onset chest pain following a meal associated with dizziness and near syncope.    Chest  pain -Retrosternal chest pain occurring while having a meal resolving with aspirin in patient with no prior cardiac history -Suspect  noncardiac given negative troponins no EKG changes, but will continue to monitor -CTA chest negative for acute PE -Echocardiogram to evaluate for focal wall motion abnormality or other abnormality -Continue home aspirin.  NTG sublingual as needed chest pain -Consider Myoview study, may be as outpatient    Postural dizziness with presyncope -Suspect vasovagal related to intensity of chest pain -Continuous cardiac monitoring to evaluate for rapid A. fib and other arrhythmias -Echocardiogram in the a.m. -Orthostatics -Fall precautions    New onset Atrial fibrillation with slow ventricular response (HCC) -No prior history of A. fib -EKG showing A. fib with rate of 55 -Due to slow rate, no rate control agents at this time -CHA2DS2-VASc score 4 -Lovenox, therapeutic dose pending decision on choice of oral anticoagulant  Large hiatal hernia -Continue home famotidine -Small frequent meals.  Avoid large meals.  Sit upright for meals    Thrombocytopenia (HCC) -Platelets 129,000, baseline, followed by hematology    CLL (chronic lymphocytic leukemia) ([place]) -Continue acalabrutinib -Last seen by oncologist on 9/27    Acquired hypothyroidism -Continue Synthroid    Benign essential hypertension -Continue lisinopril    Chronic kidney disease (CKD) stage G3a/A1, moderately decreased glomerular filtration rate (GFR) between 45-59 mL/min/1.73 square meter and albuminuria creatinine ratio less than 30 mg/g (HCC) -Renal function at baseline    Diabetes mellitus ([place]) -Currently not on any hypoglycemic agents    DVT prophylaxis: Lovenox  Code Status: full code  Family Communication:  none  Disposition Plan: Back to previous home environment Consults called: none  Status: Observation    Athena Masse MD [facility]     12/16/2019, 4:09 AM

## 2019-12-16 NOTE — Progress Notes (Signed)
Patient ID: Allen Chang, male   DOB: 1932-06-14, 86 y.o.   MRN: [id number]  This is an no charge progress note as patient was admitted this a.m. History and physical reviewed [name] is a 86 y.o. male with medical history significant for Type 2 diabetes, HTN, hypothyroidism, CKD 3a, CLL on acalabrutinib, followed by Dr. Tasia Catchings, thrombocytopenia who presents to the emergency room with sudden onset chest pain following a meal associated with dizziness and near syncope.EKG showed A. fib rate of 55.  No prior history of A. fib.  His oncologist was consulted Cardiology was consulted and since establishment.

## 2019-12-16 NOTE — Progress Notes (Signed)
*  PRELIMINARY RESULTS* Echocardiogram 2D Echocardiogram has been performed.  Allen Chang 12/16/2019, 10:51 AM

## 2019-12-16 NOTE — Consult Note (Signed)
CARDIOLOGY CONSULT NOTE               Patient ID: Allen Chang MRN: [id number] DOB/AGE: Mar 31, 1932 86 y.o.  Admit date: 12/16/2019 Referring Physician Dr. Nolberto Hanlon hospitalist Primary Physician: Ginette Pitman primary Primary Cardiologist none Reason for Consultation chest pain possible angina  History patient presents with chest pain symptoms new onset intermittent recurrent no previous cardiac disease patient states chest pain is better he states it may have started after he ate something and had chest discomfort denies any shortness of breath no blackout spells or syncope seek intervention for evaluation  Review of systems complete and found to be negative unless listed above     Past Medical History:  Diagnosis Date  . Cancer [facility])    prostate   . CKD (chronic kidney disease) stage 3, GFR 30-59 ml/min (HCC) 10/01/2016  . Complication of anesthesia    bradycardia, in ICU for 24 hour after galbladder surgery.   . Diverticulosis 2012  . Dysrhythmia    Heart skips a beat  . Elevated lipids   . GERD (gastroesophageal reflux disease)   . History of hiatal hernia   . Hypertension   . Hypothyroidism   . Leukemia ([place])   . Prostate cancer ([place])   . Skin cancer    face, scalp, behind ear,back and hand  . Tremors of nervous system     Past Surgical History:  Procedure Laterality Date  . BLADDER TUMOR EXCISION    . CARDIAC CATHETERIZATION    . CHOLECYSTECTOMY N/A 10/10/2014   Procedure: LAPAROSCOPIC CHOLECYSTECTOMY WITH INTRAOPERATIVE CHOLANGIOGRAM;  Surgeon: Leonie Green, MD;  Location: [facility] ORS;  Service: General;  Laterality: N/A;  . COLONOSCOPY WITH PROPOFOL N/A 02/16/2017   Procedure: COLONOSCOPY WITH PROPOFOL;  Surgeon: Manya Silvas, MD;  Location: [facility] ENDOSCOPY;  Service: Endoscopy;  Laterality: N/A;  . ESOPHAGOGASTRODUODENOSCOPY (EGD) WITH PROPOFOL N/A 02/16/2017   Procedure: ESOPHAGOGASTRODUODENOSCOPY (EGD) WITH PROPOFOL;  Surgeon: Manya Silvas,  MD;  Location: [facility] ENDOSCOPY;  Service: Endoscopy;  Laterality: N/A;  . HEMORRHOID SURGERY    . HERNIA REPAIR Left    x2  . JOINT REPLACEMENT Left    Partial Knee Replacement, Dr. Roland Rack  . PARTIAL KNEE ARTHROPLASTY Left 12/12/2014   Procedure: UNICOMPARTMENTAL KNEE;  Surgeon: Corky Mull, MD;  Location: [facility] ORS;  Service: Orthopedics;  Laterality: Left;  . prostate seeding    . SHOULDER ARTHROSCOPY Right   . SHOULDER ARTHROSCOPY WITH OPEN ROTATOR CUFF REPAIR Left 02/07/2016   Procedure: SHOULDER ARTHROSCOPY WITH OPEN ROTATOR CUFF REPAIR;  Surgeon: Corky Mull, MD;  Location: [facility] ORS;  Service: Orthopedics;  Laterality: Left;    (Not in a hospital admission)  Social History   Socioeconomic History  . Marital status: Married    Spouse name: Not on file  . Number of children: Not on file  . Years of education: Not on file  . Highest education level: Not on file  Occupational History  . Not on file  Tobacco Use  . Smoking status: Never Smoker  . Smokeless tobacco: Never Used  Vaping Use  . Vaping Use: Never used  Substance and Sexual Activity  . Alcohol use: No  . Drug use: No  . Sexual activity: Yes  Other Topics Concern  . Not on file  Social History Narrative  . Not on file   Social Determinants of Health   Financial Resource Strain:   . Difficulty of Paying Living Expenses: Not on file  Food Insecurity:   . Worried About Charity fundraiser in the Last Year: Not on file  . Ran Out of Food in the Last Year: Not on file  Transportation Needs:   . Lack of Transportation (Medical): Not on file  . Lack of Transportation (Non-Medical): Not on file  Physical Activity:   . Days of Exercise per Week: Not on file  . Minutes of Exercise per Session: Not on file  Stress:   . Feeling of Stress : Not on file  Social Connections:   . Frequency of Communication with Friends and Family: Not on file  . Frequency of Social Gatherings with Friends and Family: Not on file  .  Attends Religious Services: Not on file  . Active Member of Clubs or Organizations: Not on file  . Attends Archivist Meetings: Not on file  . Marital Status: Not on file  Intimate Partner Violence:   . Fear of Current or Ex-Partner: Not on file  . Emotionally Abused: Not on file  . Physically Abused: Not on file  . Sexually Abused: Not on file    Family History  Problem Relation Age of Onset  . Bone cancer Father   . Hypertension Mother   . Osteoporosis Mother   . Breast cancer Sister   . Cancer Brother   . Cancer Brother   . Lung cancer Brother   . Bladder Cancer Brother   . Melanoma Brother       Review of systems complete and found to be negative unless listed above      PHYSICAL EXAM  General: Well developed, well nourished, in no acute distress HEENT:  Normocephalic and atramatic Neck:  No JVD.  Lungs: Clear bilaterally to auscultation and percussion. Heart: HRRR . Normal S1 and S2 without gallops or murmurs.  Abdomen: Bowel sounds are positive, abdomen soft and non-tender  Msk:  Back normal, normal gait. Normal strength and tone for age. Extremities: No clubbing, cyanosis or edema.   Neuro: Alert and oriented X 3. Psych:  Good affect, responds appropriately  Labs:   Lab Results  Component Value Date   WBC 6.6 12/15/2019   HGB 12.7 (L) 12/15/2019   HCT 37.2 (L) 12/15/2019   MCV 105.4 (H) 12/15/2019   PLT 129 (L) 12/15/2019    Recent Labs  Lab 12/15/19 2006  NA 139  K 4.3  CL 104  CO2 25  BUN 36*  CREATININE 1.48*  CALCIUM 10.2  PROT 7.2  BILITOT 0.7  ALKPHOS 65  ALT 14  AST 21  GLUCOSE 152*   Lab Results  Component Value Date   CKMB 1.0 05/30/2014   TROPONINI <0.03 03/31/2017    Lab Results  Component Value Date   CHOL 183 05/31/2014   Lab Results  Component Value Date   HDL 38 (L) 05/31/2014   Lab Results  Component Value Date   LDLCALC 124 (H) 05/31/2014   Lab Results  Component Value Date   TRIG 106 05/31/2014    No results found for: CHOLHDL No results found for: LDLDIRECT    Radiology: DG Chest 2 View  Result Date: 12/15/2019 CLINICAL DATA:  Upper chest pain which began after eating supper EXAM: CHEST - 2 VIEW COMPARISON:  Esophagram 04/13/2019, radiograph 03/31/2017, CT 07/09/2006 FINDINGS: Large air and fluid containing hiatal hernia which appears similar to most recent to soften g in February though certainly increased in size since 2019. The aorta is calcified. The remaining cardiomediastinal contours are  unremarkable. Streaky opacities in the lung bases likely reflect areas atelectatic changes adjacent the hiatal hernia. Additional chronically coarsened interstitial changes are present with apical lucency suggesting some emphysematous changes present comparison CT. No convincing features of edema. No pneumothorax or effusion. No acute osseous or soft tissue abnormality. Degenerative changes are present in the imaged spine and shoulders. IMPRESSION: 1. Large hiatal hernia which appears similar to comparison esophagram from February 2021 though increased in size from more remote comparison is. 2. Streaky opacities in the bases, likely atelectasis and/or scarring. 3. No other acute cardiopulmonary abnormality. 4.  Aortic Atherosclerosis (ICD10-I70.0). Electronically Signed   By: Lovena Le M.D.   On: 12/15/2019 20:18   CT Angio Chest PE W and/or Wo Contrast  Result Date: 12/16/2019 CLINICAL DATA:  New onset atrial fibrillation. Dyspnea and chest pain EXAM: CT ANGIOGRAPHY CHEST WITH CONTRAST TECHNIQUE: Multidetector CT imaging of the chest was performed using the standard protocol during bolus administration of intravenous contrast. Multiplanar CT image reconstructions and MIPs were obtained to evaluate the vascular anatomy. CONTRAST:  62mL OMNIPAQUE IOHEXOL 350 MG/ML SOLN COMPARISON:  None. FINDINGS: Cardiovascular: Contrast injection is sufficient to demonstrate satisfactory opacification of the  pulmonary arteries to the segmental level. There is no pulmonary embolus or evidence of right heart strain. The size of the main pulmonary artery is normal. Heart size is normal, with no pericardial effusion. The course and caliber of the aorta are normal. There is mild atherosclerotic calcification. Opacification decreased due to pulmonary arterial phase contrast bolus timing. Mediastinum/Nodes: Large hiatal hernia.  No adenopathy. Lungs/Pleura: Airways are patent. No pleural effusion, lobar consolidation, pneumothorax or pulmonary infarction. Upper Abdomen: Contrast bolus timing is not optimized for evaluation of the abdominal organs. The visualized portions of the organs of the upper abdomen are normal. Musculoskeletal: No chest wall abnormality. No bony spinal canal stenosis. Review of the MIP images confirms the above findings. IMPRESSION: 1. No pulmonary embolus or other acute thoracic abnormality. 2. Large hiatal hernia. 3.  Aortic atherosclerosis (ICD10-I70.0). Electronically Signed   By: Ulyses Jarred M.D.   On: 12/16/2019 02:58    EKG: Normal sinus rhythm nonspecific ST-T wave changes  ASSESSMENT AND PLAN:  Chest pain possible angina Renal insufficiency Hypertension Bradycardia resolved Irregular heartbeat GERD Hyperlipidemia Hiatal hernia . Plan Agree with admit rule out microinfarction Follow-up EKGs and troponins Consider echocardiogram for assessment left ventricular function Consider functional study and probably done as an outpatient Consider Imdur beta-blockade therapy as well as aspirin Recommend aggressive reflux type therapy   Signed: Yolonda Kida MD  12/16/2019, 9:31 AM

## 2019-12-16 NOTE — ED Notes (Signed)
Pt ambulated to commode and return without difficulty. Denies CP or SOB

## 2019-12-16 NOTE — ED Notes (Signed)
Pt denies Cp or any other needs of concerns at this time.

## 2019-12-16 NOTE — ED Notes (Addendum)
Assumed care of pt upon being roomed, denies cp, sob, lightheaded, or dizziness at this time. HR afib 50-70s on monitor. AO x4, talking in full sentences with regular and unlabored breathing. Side rails up x2,call bell within reach.

## 2019-12-17 ENCOUNTER — Observation Stay: Payer: Medicare Other

## 2019-12-17 DIAGNOSIS — I10 Essential (primary) hypertension: Secondary | ICD-10-CM | POA: Diagnosis not present

## 2019-12-17 DIAGNOSIS — R079 Chest pain, unspecified: Secondary | ICD-10-CM | POA: Diagnosis not present

## 2019-12-17 DIAGNOSIS — E039 Hypothyroidism, unspecified: Secondary | ICD-10-CM

## 2019-12-17 DIAGNOSIS — N1831 Chronic kidney disease, stage 3a: Secondary | ICD-10-CM | POA: Diagnosis not present

## 2019-12-17 LAB — NM MYOCAR MULTI W/SPECT W/WALL MOTION / EF
Estimated workload: 1 METS
Exercise duration (min): 0 min
Exercise duration (sec): 58 s
LV dias vol: 49 mL (ref 62–150)
LV sys vol: 16 mL
MPHR: 135 {beats}/min
Peak HR: 107 {beats}/min
Percent HR: 79 %
Rest HR: 71 {beats}/min
SDS: 1
SRS: 0
SSS: 2
TID: 0.73

## 2019-12-17 LAB — CBC
HCT: 36.3 % — ABNORMAL LOW (ref 39.0–52.0)
Hemoglobin: 12.4 g/dL — ABNORMAL LOW (ref 13.0–17.0)
MCH: 35.3 pg — ABNORMAL HIGH (ref 26.0–34.0)
MCHC: 34.2 g/dL (ref 30.0–36.0)
MCV: 103.4 fL — ABNORMAL HIGH (ref 80.0–100.0)
Platelets: 112 10*3/uL — ABNORMAL LOW (ref 150–400)
RBC: 3.51 MIL/uL — ABNORMAL LOW (ref 4.22–5.81)
RDW: 12.6 % (ref 11.5–15.5)
WBC: 6.2 10*3/uL (ref 4.0–10.5)
nRBC: 0 % (ref 0.0–0.2)

## 2019-12-17 LAB — ECHOCARDIOGRAM COMPLETE
AR max vel: 2.02 cm2
AV Area VTI: 2.05 cm2
AV Area mean vel: 1.86 cm2
AV Mean grad: 6 mmHg
AV Peak grad: 10.4 mmHg
Ao pk vel: 1.62 m/s
Area-P 1/2: 3.63 cm2
S' Lateral: 2.31 cm

## 2019-12-17 LAB — HEMOGLOBIN A1C
Hgb A1c MFr Bld: 5.5 % (ref 4.8–5.6)
Mean Plasma Glucose: 111.15 mg/dL

## 2019-12-17 LAB — GLUCOSE, CAPILLARY
Glucose-Capillary: 106 mg/dL — ABNORMAL HIGH (ref 70–99)
Glucose-Capillary: 127 mg/dL — ABNORMAL HIGH (ref 70–99)

## 2019-12-17 MED ORDER — REGADENOSON 0.4 MG/5ML IV SOLN
0.4000 mg | Freq: Once | INTRAVENOUS | Status: AC
  Administered 2019-12-17: 0.4 mg via INTRAVENOUS

## 2019-12-17 MED ORDER — TECHNETIUM TC 99M TETROFOSMIN IV KIT
30.0000 | PACK | Freq: Once | INTRAVENOUS | Status: AC | PRN
  Administered 2019-12-17: 31.717 via INTRAVENOUS

## 2019-12-17 MED ORDER — TECHNETIUM TC 99M TETROFOSMIN IV KIT
10.0000 | PACK | Freq: Once | INTRAVENOUS | Status: AC | PRN
  Administered 2019-12-17: 10.202 via INTRAVENOUS

## 2019-12-17 NOTE — Plan of Care (Signed)

## 2019-12-17 NOTE — Discharge Summary (Signed)
Allen Chang:[id number] DOB: 1932/09/09 DOA: 12/16/2019  PCP: Tracie Harrier, MD  Admit date: 12/16/2019 Discharge date: 12/17/2019  Admitted From: Home Disposition: Home  Recommendations for Outpatient Follow-up:  1. Follow up with PCP in 1 week 2. Please obtain BMP/CBC in one week 3. Cardiology Dr. Clayborn Bigness in 1 week 4. Follow-up with Dr. Tasia Catchings oncology in 1 week     Discharge Condition:Stable CODE STATUS: Full Diet recommendation: Heart Healthy Brief/Interim Summary: Allen Chang is a 86 y.o. male with medical history significant for Type 2 diabetes, HTN, hypothyroidism, CKD 3a, CLL on acalabrutinib, followed by Dr. Tasia Catchings, thrombocytopenia who presents to the emergency room with sudden onset chest pain following a meal associated with dizziness and near syncope.On arrival he was afebrile with heart rate in the mid 50s and otherwise normal vitals.  EKG showed A. fib rate of 55.  No prior history of A. fib.    Troponins were negative.  Cardiology was consulted.  His hematologist was also consulted.  After reviewing his EKGs and telemetry it looks like patient was sinus rhythm with PACs.  He did not require any anticoagulation.  Initially when there was thought that patient had a fair his hematologist had cleared him to be started on anticoagulation if necessary from heme standpoint.  But since he did not have atrial fibrillation no anticoagulation was necessary.  His blood pressure medication was held due to his blood pressure being on normal to low side.  With normal EF and no wall motion abnormality.  He underwent a nuclear stress test today which was normal study with low risk study.  EF 63%.  Cardiology had cleared patient to be discharged cardiology in 1 week.  Discharge Diagnoses:  Principal Problem:   Chest pain Active Problems:   Thrombocytopenia (HCC)   CLL (chronic lymphocytic leukemia) (HCC)   Acquired hypothyroidism   Benign essential hypertension   Chronic kidney  disease (CKD) stage G3a/A1, moderately decreased glomerular filtration rate (GFR) between 45-59 mL/min/1.73 square meter and albuminuria creatinine ratio less than 30 mg/g (HCC)   Diabetes mellitus (HCC)   Hiatal hernia   Postural dizziness with presyncope   New onset Atrial fibrillation with slow ventricular response (HCC)    Discharge Instructions  Discharge Instructions    Amb referral to [facility]   Complete by: As directed    Call MD for:  temperature >100.4   Complete by: As directed    Diet - low sodium heart healthy   Complete by: As directed    Discharge instructions   Complete by: As directed    Follow up with Dr. Clayborn Bigness in one week-cardiology F/u with Dr. Tasia Catchings F/u with pcp in one week   Increase activity slowly   Complete by: As directed    No wound care   Complete by: As directed      Allergies as of 12/17/2019      Reactions   Oxycodone-acetaminophen Other (See Comments)   Other reaction(s): Hallucinations Other reaction(s): Other (See Comments) Hallucination Hallucination Hallucination   Percocet [oxycodone-acetaminophen] Other (See Comments)   Hallucination   Voltaren [diclofenac Sodium] Palpitations      Medication List    STOP taking these medications   Calquence 100 MG capsule Generic drug: acalabrutinib   lisinopril 20 MG tablet Commonly known as: ZESTRIL   propranolol 10 MG tablet Commonly known as: INDERAL     TAKE these medications   acetaminophen 500 MG tablet Commonly known as: TYLENOL Take 1,000 mg by  mouth every 8 (eight) hours as needed for moderate pain.   aspirin 81 MG tablet Take 81 mg by mouth daily. In am   b complex vitamins capsule Take 1 capsule by mouth daily.   Black Cherry Concentrate Liqd Take 15 mLs by mouth daily.   calcium carbonate 1500 (600 Ca) MG Tabs tablet Commonly known as: OSCAL Take by mouth daily with breakfast.   Cholecalciferol 25 MCG (1000 UT) tablet Take by mouth.   DULoxetine 20 MG  capsule Commonly known as: CYMBALTA Take 20 mg by mouth daily.   famotidine 40 MG tablet Commonly known as: PEPCID Take 40 mg by mouth 2 (two) times daily.   ferrous sulfate 324 MG Tbec Take 324 mg by mouth 3 (three) times daily.   Fish Oil 1000 MG Caps Take 1 capsule by mouth daily.   Flax Seed Oil 1000 MG Caps Take 1 capsule by mouth daily.   gabapentin 100 MG capsule Commonly known as: NEURONTIN Take 100 mg by mouth 3 (three) times daily.   Garlic 7341 MG Caps Take 1 capsule by mouth every morning.   latanoprost 0.005 % ophthalmic solution Commonly known as: XALATAN Place 1 drop into both eyes at bedtime.   levothyroxine 25 MCG tablet Commonly known as: SYNTHROID Take 25 mcg by mouth daily at 12 noon.   loratadine 10 MG dissolvable tablet Commonly known as: CLARITIN REDITABS Take 10 mg by mouth daily. In am   multivitamin with minerals tablet Take 1 tablet by mouth daily. In am   PREVAGEN PO Take 1 tablet by mouth daily.       Follow-up Information    Tracie Harrier, MD Follow up in 1 week(s).   Specialty: Internal Medicine Contact information: [address] [place] [address] [phone]        Yolonda Kida, MD Follow up in 1 week(s).   Specialties: Cardiology, Internal Medicine Contact information: [address] [place] [address] [phone]              Allergies  Allergen Reactions  . Oxycodone-Acetaminophen Other (See Comments)    Other reaction(s): Hallucinations Other reaction(s): Other (See Comments) Hallucination Hallucination Hallucination  . Percocet [Oxycodone-Acetaminophen] Other (See Comments)    Hallucination   . Voltaren [Diclofenac Sodium] Palpitations    Consultations:  Cardiology, oncology   Procedures/Studies: DG Chest 2 View  Result Date: 12/15/2019 CLINICAL DATA:  Upper chest pain which began after eating supper EXAM: CHEST - 2 VIEW COMPARISON:   Esophagram 04/13/2019, radiograph 03/31/2017, CT 07/09/2006 FINDINGS: Large air and fluid containing hiatal hernia which appears similar to most recent to soften g in February though certainly increased in size since 2019. The aorta is calcified. The remaining cardiomediastinal contours are unremarkable. Streaky opacities in the lung bases likely reflect areas atelectatic changes adjacent the hiatal hernia. Additional chronically coarsened interstitial changes are present with apical lucency suggesting some emphysematous changes present comparison CT. No convincing features of edema. No pneumothorax or effusion. No acute osseous or soft tissue abnormality. Degenerative changes are present in the imaged spine and shoulders. IMPRESSION: 1. Large hiatal hernia which appears similar to comparison esophagram from February 2021 though increased in size from more remote comparison is. 2. Streaky opacities in the bases, likely atelectasis and/or scarring. 3. No other acute cardiopulmonary abnormality. 4.  Aortic Atherosclerosis (ICD10-I70.0). Electronically Signed   By: Lovena Le M.D.   On: 12/15/2019 20:18   CT Angio Chest PE W and/or Wo  Contrast  Result Date: 12/16/2019 CLINICAL DATA:  New onset atrial fibrillation. Dyspnea and chest pain EXAM: CT ANGIOGRAPHY CHEST WITH CONTRAST TECHNIQUE: Multidetector CT imaging of the chest was performed using the standard protocol during bolus administration of intravenous contrast. Multiplanar CT image reconstructions and MIPs were obtained to evaluate the vascular anatomy. CONTRAST:  17mL OMNIPAQUE IOHEXOL 350 MG/ML SOLN COMPARISON:  None. FINDINGS: Cardiovascular: Contrast injection is sufficient to demonstrate satisfactory opacification of the pulmonary arteries to the segmental level. There is no pulmonary embolus or evidence of right heart strain. The size of the main pulmonary artery is normal. Heart size is normal, with no pericardial effusion. The course and caliber  of the aorta are normal. There is mild atherosclerotic calcification. Opacification decreased due to pulmonary arterial phase contrast bolus timing. Mediastinum/Nodes: Large hiatal hernia.  No adenopathy. Lungs/Pleura: Airways are patent. No pleural effusion, lobar consolidation, pneumothorax or pulmonary infarction. Upper Abdomen: Contrast bolus timing is not optimized for evaluation of the abdominal organs. The visualized portions of the organs of the upper abdomen are normal. Musculoskeletal: No chest wall abnormality. No bony spinal canal stenosis. Review of the MIP images confirms the above findings. IMPRESSION: 1. No pulmonary embolus or other acute thoracic abnormality. 2. Large hiatal hernia. 3.  Aortic atherosclerosis (ICD10-I70.0). Electronically Signed   By: Ulyses Jarred M.D.   On: 12/16/2019 02:58   NM Myocar Multi W/Spect W/Wall Motion / EF  Result Date: 12/17/2019  Blood pressure demonstrated a normal response to exercise.  There was no ST segment deviation noted during stress.  No T wave inversion was noted during stress.  Adequate chemical stress  The study is normal.  This is a low risk study.  Nuclear stress EF: 63%.  The left ventricular ejection fraction is normal (55-65%).  Await Myoview images   ECHOCARDIOGRAM COMPLETE  Result Date: 12/17/2019    ECHOCARDIOGRAM REPORT   Patient Name:   Allen Chang Date of Exam: 12/16/2019 Medical Rec #:  [id number]       Height:       69.5 in Accession #:    [id number]      Weight:       174.8 lb Date of Birth:  Nov 08, 1932       BSA:          1.961 m Patient Age:    4 years        BP:           108/65 mmHg Patient Gender: M               HR:           52 bpm. Exam Location:  [facility] Procedure: 2D Echo, Cardiac Doppler and Color Doppler Indications:     Syncope 780.2  History:         Patient has no prior history of Echocardiogram examinations.                  Risk Factors:Hypertension. CKD.  Sonographer:     Sherrie Sport RDCS (AE) Referring  Phys:  [id number] Athena Masse Diagnosing Phys: Yolonda Kida MD IMPRESSIONS  1. Left ventricular ejection fraction, by estimation, is 60 to 65%. The left ventricle has normal function. The left ventricle has no regional wall motion abnormalities. Left ventricular diastolic parameters were normal.  2. Right ventricular systolic function is normal. The right ventricular size is normal.  3. The mitral valve is normal in structure. No evidence of mitral valve regurgitation.  4. The aortic valve is grossly normal. Aortic valve regurgitation is not visualized. FINDINGS  Left Ventricle: Left ventricular ejection fraction, by estimation, is 60 to 65%. The left ventricle has normal function. The left ventricle has no regional wall motion abnormalities. The left ventricular internal cavity size was normal in size. There is  no left ventricular hypertrophy. Left ventricular diastolic parameters were normal. Right Ventricle: The right ventricular size is normal. No increase in right ventricular wall thickness. Right ventricular systolic function is normal. Left Atrium: Left atrial size was normal in size. Right Atrium: Right atrial size was normal in size. Pericardium: There is no evidence of pericardial effusion. Mitral Valve: The mitral valve is normal in structure. No evidence of mitral valve regurgitation. Tricuspid Valve: The tricuspid valve is normal in structure. Tricuspid valve regurgitation is not demonstrated. Aortic Valve: The aortic valve is grossly normal. Aortic valve regurgitation is not visualized. Aortic valve mean gradient measures 6.0 mmHg. Aortic valve peak gradient measures 10.4 mmHg. Aortic valve area, by VTI measures 2.05 cm. Pulmonic Valve: The pulmonic valve was normal in structure. Pulmonic valve regurgitation is not visualized. Aorta: The aortic arch was not well visualized. IAS/Shunts: No atrial level shunt detected by color flow Doppler.  LEFT VENTRICLE PLAX 2D LVIDd:         3.95 cm   Diastology LVIDs:         2.31 cm  LV e' medial:    6.96 cm/s LV PW:         0.99 cm  LV E/e' medial:  13.6 LV IVS:        0.90 cm  LV e' lateral:   5.77 cm/s LVOT diam:     2.00 cm  LV E/e' lateral: 16.4 LV SV:         74 LV SV Index:   38 LVOT Area:     3.14 cm  RIGHT VENTRICLE RV Basal diam:  3.75 cm RV S prime:     18.50 cm/s TAPSE (M-mode): 3.5 cm LEFT ATRIUM             Index       RIGHT ATRIUM           Index LA diam:        3.20 cm 1.63 cm/m  RA Area:     18.60 cm LA Vol (A2C):   49.6 ml 25.29 ml/m RA Volume:   51.70 ml  26.37 ml/m LA Vol (A4C):   34.8 ml 17.75 ml/m LA Biplane Vol: 41.7 ml 21.27 ml/m  AORTIC VALVE                    PULMONIC VALVE AV Area (Vmax):    2.02 cm     PV Vmax:        0.71 m/s AV Area (Vmean):   1.86 cm     PV Peak grad:   2.0 mmHg AV Area (VTI):     2.05 cm     RVOT Peak grad: 3 mmHg AV Vmax:           161.50 cm/s AV Vmean:          117.500 cm/s AV VTI:            0.363 m AV Peak Grad:      10.4 mmHg AV Mean Grad:      6.0 mmHg LVOT Vmax:         104.00 cm/s LVOT Vmean:  69.600 cm/s LVOT VTI:          0.237 m LVOT/AV VTI ratio: 0.65  AORTA Ao Root diam: 3.10 cm MITRAL VALVE               TRICUSPID VALVE MV Area (PHT): 3.63 cm    TR Peak grad:   25.2 mmHg MV Decel Time: 209 msec    TR Vmax:        251.00 cm/s MV E velocity: 94.70 cm/s MV A velocity: 76.70 cm/s  SHUNTS MV E/A ratio:  1.23        Systemic VTI:  0.24 m                            Systemic Diam: 2.00 cm Yolonda Kida MD Electronically signed by Yolonda Kida MD Signature Date/Time: 12/17/2019/11:15:29 AM    Final        Subjective: No complaints  Discharge Exam: Vitals:   12/17/19 0733 12/17/19 1311  BP: 104/69 132/72  Pulse: 61 73  Resp: (!) 21 19  Temp: 97.9 F (36.6 C) 98 F (36.7 C)  SpO2: 98% 100%   Vitals:   12/17/19 0324 12/17/19 0500 12/17/19 0733 12/17/19 1311  BP: 122/68  104/69 132/72  Pulse: 68  61 73  Resp: 18  (!) 21 19  Temp: 97.9 F (36.6 C)  97.9 F  (36.6 C) 98 F (36.7 C)  TempSrc: Oral  Oral Oral  SpO2: 99%  98% 100%  Weight:  75.8 kg    Height:        General: Pt is alert, awake, not in acute distress Cardiovascular: RRR, S1/S2 +, no rubs, no gallops Respiratory: CTA bilaterally, no wheezing, no rhonchi Abdominal: Soft, NT, ND, bowel sounds + Extremities: no edema, no cyanosis    The results of significant diagnostics from this hospitalization (including imaging, microbiology, ancillary and laboratory) are listed below for reference.     Microbiology: Recent Results (from the past 240 hour(s))  Respiratory Panel by RT PCR (Flu A&B, Covid) - Nasopharyngeal Swab     Status: None   Collection Time: 12/16/19  5:30 AM   Specimen: Nasopharyngeal Swab  Result Value Ref Range Status   SARS Coronavirus 2 by RT PCR NEGATIVE NEGATIVE Final    Comment: (NOTE) SARS-CoV-2 target nucleic acids are NOT DETECTED.  The SARS-CoV-2 RNA is generally detectable in upper respiratoy specimens during the acute phase of infection. The lowest concentration of SARS-CoV-2 viral copies this assay can detect is 131 copies/mL. A negative result does not preclude SARS-Cov-2 infection and should not be used as the sole basis for treatment or other patient management decisions. A negative result may occur with  improper specimen collection/handling, submission of specimen other than nasopharyngeal swab, presence of viral mutation(s) within the areas targeted by this assay, and inadequate number of viral copies (<131 copies/mL). A negative result must be combined with clinical observations, patient history, and epidemiological information. The expected result is Negative.  Fact Sheet for Patients:  PinkCheek.be  Fact Sheet for Healthcare Providers:  GravelBags.it  This test is no t yet approved or cleared by the [place] FDA and  has been authorized for detection and/or diagnosis of  SARS-CoV-2 by FDA under an Emergency Use Authorization (EUA). This EUA will remain  in effect (meaning this test can be used) for the duration of the COVID-19 declaration under Section 564(b)(1) of the Act, 21 U.S.C. section 360bbb-3(b)(1),  unless the authorization is terminated or revoked sooner.     Influenza A by PCR NEGATIVE NEGATIVE Final   Influenza B by PCR NEGATIVE NEGATIVE Final    Comment: (NOTE) The Xpert Xpress SARS-CoV-2/FLU/RSV assay is intended as an aid in  the diagnosis of influenza from Nasopharyngeal swab specimens and  should not be used as a sole basis for treatment. Nasal washings and  aspirates are unacceptable for Xpert Xpress SARS-CoV-2/FLU/RSV  testing.  Fact Sheet for Patients: PinkCheek.be  Fact Sheet for Healthcare Providers: GravelBags.it  This test is not yet approved or cleared by the [place] FDA and  has been authorized for detection and/or diagnosis of SARS-CoV-2 by  FDA under an Emergency Use Authorization (EUA). This EUA will remain  in effect (meaning this test can be used) for the duration of the  Covid-19 declaration under Section 564(b)(1) of the Act, 21  U.S.C. section 360bbb-3(b)(1), unless the authorization is  terminated or revoked. Performed at [facility], [address] [place], [place] [address]      Labs: BNP (last 3 results) No results for input(s): BNP in the last 8760 hours. Basic Metabolic Panel: Recent Labs  Lab 12/15/19 2006  NA 139  K 4.3  CL 104  CO2 25  GLUCOSE 152*  BUN 36*  CREATININE 1.48*  CALCIUM 10.2   Liver Function Tests: Recent Labs  Lab 12/15/19 2006  AST 21  ALT 14  ALKPHOS 65  BILITOT 0.7  PROT 7.2  ALBUMIN 3.8   No results for input(s): LIPASE, AMYLASE in the last 168 hours. No results for input(s): AMMONIA in the last 168 hours. CBC: Recent Labs  Lab 12/15/19 2006 12/17/19 0745  WBC 6.6 6.2  NEUTROABS  3.7  --   HGB 12.7* 12.4*  HCT 37.2* 36.3*  MCV 105.4* 103.4*  PLT 129* 112*   Cardiac Enzymes: No results for input(s): CKTOTAL, CKMB, CKMBINDEX, TROPONINI in the last 168 hours. BNP: Invalid input(s): POCBNP CBG: Recent Labs  Lab 12/16/19 0730 12/16/19 1228 12/16/19 2103 12/17/19 0728 12/17/19 1312  GLUCAP 98 132* 116* 106* 127*   D-Dimer No results for input(s): DDIMER in the last 72 hours. Hgb A1c No results for input(s): HGBA1C in the last 72 hours. Lipid Profile No results for input(s): CHOL, HDL, LDLCALC, TRIG, CHOLHDL, LDLDIRECT in the last 72 hours. Thyroid function studies No results for input(s): TSH, T4TOTAL, T3FREE, THYROIDAB in the last 72 hours.  Invalid input(s): FREET3 Anemia work up No results for input(s): VITAMINB12, FOLATE, FERRITIN, TIBC, IRON, RETICCTPCT in the last 72 hours. Urinalysis    Component Value Date/Time   COLORURINE YELLOW (A) 03/31/2017 2210   APPEARANCEUR HAZY (A) 03/31/2017 2210   APPEARANCEUR Clear 05/22/2013 0646   LABSPEC 1.019 03/31/2017 2210   LABSPEC 1.016 05/22/2013 0646   PHURINE 5.0 03/31/2017 2210   GLUCOSEU NEGATIVE 03/31/2017 2210   GLUCOSEU Negative 05/22/2013 0646   HGBUR NEGATIVE 03/31/2017 2210   BILIRUBINUR NEGATIVE 03/31/2017 2210   BILIRUBINUR Negative 05/22/2013 0646   KETONESUR 5 (A) 03/31/2017 2210   PROTEINUR 30 (A) 03/31/2017 2210   NITRITE NEGATIVE 03/31/2017 2210   LEUKOCYTESUR NEGATIVE 03/31/2017 2210   LEUKOCYTESUR Negative 05/22/2013 0646   Sepsis Labs Invalid input(s): PROCALCITONIN,  WBC,  LACTICIDVEN Microbiology Recent Results (from the past 240 hour(s))  Respiratory Panel by RT PCR (Flu A&B, Covid) - Nasopharyngeal Swab     Status: None   Collection Time: 12/16/19  5:30 AM   Specimen: Nasopharyngeal Swab  Result  Value Ref Range Status   SARS Coronavirus 2 by RT PCR NEGATIVE NEGATIVE Final    Comment: (NOTE) SARS-CoV-2 target nucleic acids are NOT DETECTED.  The SARS-CoV-2 RNA is  generally detectable in upper respiratoy specimens during the acute phase of infection. The lowest concentration of SARS-CoV-2 viral copies this assay can detect is 131 copies/mL. A negative result does not preclude SARS-Cov-2 infection and should not be used as the sole basis for treatment or other patient management decisions. A negative result may occur with  improper specimen collection/handling, submission of specimen other than nasopharyngeal swab, presence of viral mutation(s) within the areas targeted by this assay, and inadequate number of viral copies (<131 copies/mL). A negative result must be combined with clinical observations, patient history, and epidemiological information. The expected result is Negative.  Fact Sheet for Patients:  PinkCheek.be  Fact Sheet for Healthcare Providers:  GravelBags.it  This test is no t yet approved or cleared by the [place] FDA and  has been authorized for detection and/or diagnosis of SARS-CoV-2 by FDA under an Emergency Use Authorization (EUA). This EUA will remain  in effect (meaning this test can be used) for the duration of the COVID-19 declaration under Section 564(b)(1) of the Act, 21 U.S.C. section 360bbb-3(b)(1), unless the authorization is terminated or revoked sooner.     Influenza A by PCR NEGATIVE NEGATIVE Final   Influenza B by PCR NEGATIVE NEGATIVE Final    Comment: (NOTE) The Xpert Xpress SARS-CoV-2/FLU/RSV assay is intended as an aid in  the diagnosis of influenza from Nasopharyngeal swab specimens and  should not be used as a sole basis for treatment. Nasal washings and  aspirates are unacceptable for Xpert Xpress SARS-CoV-2/FLU/RSV  testing.  Fact Sheet for Patients: PinkCheek.be  Fact Sheet for Healthcare Providers: GravelBags.it  This test is not yet approved or cleared by the [place] FDA and  has been authorized for detection and/or diagnosis of SARS-CoV-2 by  FDA under an Emergency Use Authorization (EUA). This EUA will remain  in effect (meaning this test can be used) for the duration of the  Covid-19 declaration under Section 564(b)(1) of the Act, 21  U.S.C. section 360bbb-3(b)(1), unless the authorization is  terminated or revoked. Performed at [facility], [address], [place], [place] [address]      Time coordinating discharge: Over 30 minutes  SIGNED:   Nolberto Hanlon, MD  [facility] 12/17/2019, 3:17 PM Pager   If 7PM-7AM, please contact night-coverage [email or web address] Password [email or web address]

## 2019-12-17 NOTE — TOC Progression Note (Signed)
Transition of Care [facility]) - Progression Note    Patient Details  Name: Allen Chang MRN: [id number] Date of Birth: April 21, 1932  [facility] CM/SW Contact  Meriel Flavors, LCSW Phone Number: 12/17/2019, 2:45 PM  Clinical Narrative:    CSW provided patient with MOON letter and explained the meaning. Patient signed at bedside with wife present, CSW provided printed, signed copy.       Expected Discharge Plan and Services                                                 Social Determinants of Health (SDOH) Interventions    Readmission Risk Interventions No flowsheet data found.

## 2019-12-17 NOTE — Discharge Instructions (Signed)

## 2019-12-17 NOTE — Progress Notes (Signed)
[facility] Cardiology    SUBJECTIVE: Patient denies any chest pain feels reasonably well no vertigo no lightheadedness no palpitations or tachycardia   Vitals:   12/16/19 1919 12/17/19 0324 12/17/19 0500 12/17/19 0733  BP: 134/74 122/68  104/69  Pulse: 68 68  61  Resp: 19 18  (!) 21  Temp: 98.2 F (36.8 C) 97.9 F (36.6 C)  97.9 F (36.6 C)  TempSrc: Oral Oral  Oral  SpO2: 99% 99%  98%  Weight:   75.8 kg   Height:         Intake/Output Summary (Last 24 hours) at 12/17/2019 1020 Last data filed at 12/16/2019 1845 Gross per 24 hour  Intake 240 ml  Output --  Net 240 ml      PHYSICAL EXAM  General: Well developed, well nourished, in no acute distress HEENT:  Normocephalic and atramatic Neck:  No JVD.  Lungs: Clear bilaterally to auscultation and percussion. Heart: HRRR . Normal S1 and S2 without gallops or murmurs.  Abdomen: Bowel sounds are positive, abdomen soft and non-tender  Msk:  Back normal, normal gait. Normal strength and tone for age. Extremities: No clubbing, cyanosis or edema.   Neuro: Alert and oriented X 3. Psych:  Good affect, responds appropriately   LABS: Basic Metabolic Panel: Recent Labs    12/15/19 2006  NA 139  K 4.3  CL 104  CO2 25  GLUCOSE 152*  BUN 36*  CREATININE 1.48*  CALCIUM 10.2   Liver Function Tests: Recent Labs    12/15/19 2006  AST 21  ALT 14  ALKPHOS 65  BILITOT 0.7  PROT 7.2  ALBUMIN 3.8   No results for input(s): LIPASE, AMYLASE in the last 72 hours. CBC: Recent Labs    12/15/19 2006 12/17/19 0745  WBC 6.6 6.2  NEUTROABS 3.7  --   HGB 12.7* 12.4*  HCT 37.2* 36.3*  MCV 105.4* 103.4*  PLT 129* 112*   Cardiac Enzymes: No results for input(s): CKTOTAL, CKMB, CKMBINDEX, TROPONINI in the last 72 hours. BNP: Invalid input(s): POCBNP D-Dimer: No results for input(s): DDIMER in the last 72 hours. Hemoglobin A1C: No results for input(s): HGBA1C in the last 72 hours. Fasting Lipid Panel: No results for  input(s): CHOL, HDL, LDLCALC, TRIG, CHOLHDL, LDLDIRECT in the last 72 hours. Thyroid Function Tests: No results for input(s): TSH, T4TOTAL, T3FREE, THYROIDAB in the last 72 hours.  Invalid input(s): FREET3 Anemia Panel: No results for input(s): VITAMINB12, FOLATE, FERRITIN, TIBC, IRON, RETICCTPCT in the last 72 hours.  DG Chest 2 View  Result Date: 12/15/2019 CLINICAL DATA:  Upper chest pain which began after eating supper EXAM: CHEST - 2 VIEW COMPARISON:  Esophagram 04/13/2019, radiograph 03/31/2017, CT 07/09/2006 FINDINGS: Large air and fluid containing hiatal hernia which appears similar to most recent to soften g in February though certainly increased in size since 2019. The aorta is calcified. The remaining cardiomediastinal contours are unremarkable. Streaky opacities in the lung bases likely reflect areas atelectatic changes adjacent the hiatal hernia. Additional chronically coarsened interstitial changes are present with apical lucency suggesting some emphysematous changes present comparison CT. No convincing features of edema. No pneumothorax or effusion. No acute osseous or soft tissue abnormality. Degenerative changes are present in the imaged spine and shoulders. IMPRESSION: 1. Large hiatal hernia which appears similar to comparison esophagram from February 2021 though increased in size from more remote comparison is. 2. Streaky opacities in the bases, likely atelectasis and/or scarring. 3. No other acute cardiopulmonary abnormality. 4.  Aortic  Atherosclerosis (ICD10-I70.0). Electronically Signed   By: Lovena Le M.D.   On: 12/15/2019 20:18   CT Angio Chest PE W and/or Wo Contrast  Result Date: 12/16/2019 CLINICAL DATA:  New onset atrial fibrillation. Dyspnea and chest pain EXAM: CT ANGIOGRAPHY CHEST WITH CONTRAST TECHNIQUE: Multidetector CT imaging of the chest was performed using the standard protocol during bolus administration of intravenous contrast. Multiplanar CT image  reconstructions and MIPs were obtained to evaluate the vascular anatomy. CONTRAST:  67mL OMNIPAQUE IOHEXOL 350 MG/ML SOLN COMPARISON:  None. FINDINGS: Cardiovascular: Contrast injection is sufficient to demonstrate satisfactory opacification of the pulmonary arteries to the segmental level. There is no pulmonary embolus or evidence of right heart strain. The size of the main pulmonary artery is normal. Heart size is normal, with no pericardial effusion. The course and caliber of the aorta are normal. There is mild atherosclerotic calcification. Opacification decreased due to pulmonary arterial phase contrast bolus timing. Mediastinum/Nodes: Large hiatal hernia.  No adenopathy. Lungs/Pleura: Airways are patent. No pleural effusion, lobar consolidation, pneumothorax or pulmonary infarction. Upper Abdomen: Contrast bolus timing is not optimized for evaluation of the abdominal organs. The visualized portions of the organs of the upper abdomen are normal. Musculoskeletal: No chest wall abnormality. No bony spinal canal stenosis. Review of the MIP images confirms the above findings. IMPRESSION: 1. No pulmonary embolus or other acute thoracic abnormality. 2. Large hiatal hernia. 3.  Aortic atherosclerosis (ICD10-I70.0). Electronically Signed   By: Ulyses Jarred M.D.   On: 12/16/2019 02:58     Echo preserved left ventricular function  TELEMETRY: Normal sinus rhythm no evidence of atrial fibrillation rate of 80 heart rate occasionally irregular  ASSESSMENT AND PLAN:  Principal Problem:   Chest pain Active Problems:   Thrombocytopenia (HCC)   CLL (chronic lymphocytic leukemia) (HCC)   Acquired hypothyroidism   Benign essential hypertension   Chronic kidney disease (CKD) stage G3a/A1, moderately decreased glomerular filtration rate (GFR) between 45-59 mL/min/1.73 square meter and albuminuria creatinine ratio less than 30 mg/g (HCC)   Diabetes mellitus ([place])   Hiatal hernia   Postural dizziness with  presyncope   New onset Atrial fibrillation with slow ventricular response (HCC)    Plan Chest pain improved none since admission probably noncardiac continue conservative therapy EKG does not suggest atrial fibrillation is sinus with PACs no anticoagulation indicated Diabetes management and control Chronic lymphocytic leukemia stable outpatient follow-up with hematology Hypertension continue current management and control Stable for discharge have the patient follow-up with cardiology as an outpatient    Yolonda Kida, MD 12/17/2019 10:20 AM

## 2019-12-19 LAB — GLUCOSE, CAPILLARY: Glucose-Capillary: 110 mg/dL — ABNORMAL HIGH (ref 70–99)

## 2020-01-31 ENCOUNTER — Other Ambulatory Visit: Payer: Self-pay | Admitting: Internal Medicine

## 2020-01-31 DIAGNOSIS — R413 Other amnesia: Secondary | ICD-10-CM

## 2020-01-31 DIAGNOSIS — R519 Headache, unspecified: Secondary | ICD-10-CM

## 2020-02-03 ENCOUNTER — Ambulatory Visit
Admission: RE | Admit: 2020-02-03 | Discharge: 2020-02-03 | Disposition: A | Discharge status: Home / Self Care | Payer: Medicare Other | Source: Ambulatory Visit | Attending: Internal Medicine | Admitting: Internal Medicine

## 2020-02-03 ENCOUNTER — Other Ambulatory Visit: Payer: Self-pay

## 2020-02-03 DIAGNOSIS — R519 Headache, unspecified: Secondary | ICD-10-CM | POA: Insufficient documentation

## 2020-02-03 DIAGNOSIS — R413 Other amnesia: Secondary | ICD-10-CM | POA: Diagnosis not present

## 2020-02-08 ENCOUNTER — Encounter: Payer: Self-pay | Admitting: Oncology

## 2020-02-14 ENCOUNTER — Inpatient Hospital Stay (HOSPITAL_BASED_OUTPATIENT_CLINIC_OR_DEPARTMENT_OTHER): Payer: Medicare Other | Admitting: Oncology

## 2020-02-14 ENCOUNTER — Inpatient Hospital Stay: Payer: Medicare Other | Attending: Oncology

## 2020-02-14 ENCOUNTER — Encounter: Payer: Self-pay | Admitting: Oncology

## 2020-02-14 VITALS — BP 114/74 | HR 56 | Temp 97.6°F | Resp 16 | Wt 168.8 lb

## 2020-02-14 DIAGNOSIS — D509 Iron deficiency anemia, unspecified: Secondary | ICD-10-CM | POA: Insufficient documentation

## 2020-02-14 DIAGNOSIS — Z7982 Long term (current) use of aspirin: Secondary | ICD-10-CM | POA: Diagnosis not present

## 2020-02-14 DIAGNOSIS — C9110 Chronic lymphocytic leukemia of B-cell type not having achieved remission: Secondary | ICD-10-CM

## 2020-02-14 DIAGNOSIS — E039 Hypothyroidism, unspecified: Secondary | ICD-10-CM | POA: Diagnosis not present

## 2020-02-14 DIAGNOSIS — N1831 Chronic kidney disease, stage 3a: Secondary | ICD-10-CM | POA: Insufficient documentation

## 2020-02-14 DIAGNOSIS — D472 Monoclonal gammopathy: Secondary | ICD-10-CM | POA: Diagnosis not present

## 2020-02-14 DIAGNOSIS — Z5111 Encounter for antineoplastic chemotherapy: Secondary | ICD-10-CM | POA: Diagnosis not present

## 2020-02-14 DIAGNOSIS — Z8546 Personal history of malignant neoplasm of prostate: Secondary | ICD-10-CM | POA: Insufficient documentation

## 2020-02-14 DIAGNOSIS — Z79899 Other long term (current) drug therapy: Secondary | ICD-10-CM | POA: Diagnosis not present

## 2020-02-14 DIAGNOSIS — I129 Hypertensive chronic kidney disease with stage 1 through stage 4 chronic kidney disease, or unspecified chronic kidney disease: Secondary | ICD-10-CM | POA: Diagnosis not present

## 2020-02-14 DIAGNOSIS — I4891 Unspecified atrial fibrillation: Secondary | ICD-10-CM | POA: Insufficient documentation

## 2020-02-14 DIAGNOSIS — D696 Thrombocytopenia, unspecified: Secondary | ICD-10-CM | POA: Diagnosis not present

## 2020-02-14 LAB — COMPREHENSIVE METABOLIC PANEL
ALT: 17 U/L (ref 0–44)
AST: 23 U/L (ref 15–41)
Albumin: 4 g/dL (ref 3.5–5.0)
Alkaline Phosphatase: 62 U/L (ref 38–126)
Anion gap: 10 (ref 5–15)
BUN: 19 mg/dL (ref 8–23)
CO2: 30 mmol/L (ref 22–32)
Calcium: 9.6 mg/dL (ref 8.9–10.3)
Chloride: 102 mmol/L (ref 98–111)
Creatinine, Ser: 1.38 mg/dL — ABNORMAL HIGH (ref 0.61–1.24)
GFR, Estimated: 50 mL/min — ABNORMAL LOW (ref >60)
Glucose, Bld: 98 mg/dL (ref 70–99)
Potassium: 3.7 mmol/L (ref 3.5–5.1)
Sodium: 142 mmol/L (ref 135–145)
Total Bilirubin: 0.7 mg/dL (ref 0.3–1.2)
Total Protein: 7.6 g/dL (ref 6.5–8.1)

## 2020-02-14 LAB — CBC WITH DIFFERENTIAL/PLATELET
Abs Immature Granulocytes: 0.02 10*3/uL (ref 0.00–0.07)
Basophils Absolute: 0.1 10*3/uL (ref 0.0–0.1)
Basophils Relative: 1 %
Eosinophils Absolute: 0.2 10*3/uL (ref 0.0–0.5)
Eosinophils Relative: 3 %
HCT: 45.2 % (ref 39.0–52.0)
Hemoglobin: 15 g/dL (ref 13.0–17.0)
Immature Granulocytes: 0 %
Lymphocytes Relative: 33 %
Lymphs Abs: 2.4 10*3/uL (ref 0.7–4.0)
MCH: 34.6 pg — ABNORMAL HIGH (ref 26.0–34.0)
MCHC: 33.2 g/dL (ref 30.0–36.0)
MCV: 104.1 fL — ABNORMAL HIGH (ref 80.0–100.0)
Monocytes Absolute: 1.1 10*3/uL — ABNORMAL HIGH (ref 0.1–1.0)
Monocytes Relative: 15 %
Neutro Abs: 3.5 10*3/uL (ref 1.7–7.7)
Neutrophils Relative %: 48 %
Platelets: 141 10*3/uL — ABNORMAL LOW (ref 150–400)
RBC: 4.34 MIL/uL (ref 4.22–5.81)
RDW: 12.1 % (ref 11.5–15.5)
WBC: 7.3 10*3/uL (ref 4.0–10.5)
nRBC: 0 % (ref 0.0–0.2)

## 2020-02-14 LAB — LACTATE DEHYDROGENASE: LDH: 116 U/L (ref 98–192)

## 2020-02-14 NOTE — Progress Notes (Signed)
Patient here for follow up after hospitalization.  Having new left sided headache that has been worked up by PCP and is scheduled to see neurology next week.    Patient's wife brings in unused Calquence 100mg  capsules and will give to our oral chemo pharmacy department.

## 2020-02-14 NOTE — Progress Notes (Signed)
Hematology/Oncology Follow Up Note [facility] [phone] [phone]  CONSULT NOTE Patient Care Team: Tracie Harrier, MD as PCP - General (Internal Medicine)  REASON FOR VISIT  follow up for CLL and iron deficiency anemia  HISTORY OF PRESENTING ILLNESS:  Allen Chang 86 y.o.  male with PMH listed as below who was referred by primary care provider to me for evaluation of leukocytosis. He has a history of prostate cancer which was diagnosed 10 years ago. He underwent brachytherapy. He is not any castration treatment. Per patient, he follows up with Dr.Wolf and most recent PSA is normal.   Patient also reports feeling fatigue lately. Recent labs show leukocytosis, mild anemia, macrocytosis, mild thrombocytopenia, low ferritin. Denies blood in the stool. He was started on over the counter iron supplement but wife who manages patient's medication decides to only let patient to take iron medication every other day.   # received IV iron Venofer x 4. Colonoscopy was done on 02/16/2017 which showed polyps, internal hemorrhoids, negative for malignancy.  ##Patient had bone marrow biopsy on 09/22/2018 Pathology showed hypercellular bone marrow with extensive involvement by chronic lymphocytic leukemia. # Started on acalabrutinib 100 mg twice daily on 10/01/2018. 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  #August 2020-acalabrutinib 100 mg twice daily Stopped in October 2021 during his admission. INTERVAL HISTORY Allen Chang is a 86 y.o. male who has above history reviewed by me presents for 4 months follow up for CLL and iron deficiency anemia, macrocytic anemia.  Currently off of acalabrutinib since recent admission. Patient was admitted from 12/16/19- 10/30/ 2021 due to sudden onset chest pain. Troponins were negative in the emergency room.  Initial EKG showed atrial fibrillation with heart rate of 55. Patient was seen by  cardiology and clarified that the EKG and telemetry actually showed sinus rhythm with PACs patient did not require any anticoagulation. Echocardiogram showed normal EF and no wall motion abnormality.  Nuclear stress testing showed low risk study, EF 63%. Patient was discharged home and followed up with Dr. Clayborn Bigness on January 26, 2020  Today patient was accompanied by his wife.  Patient continues to have occasional chest pain.  Today no chest pain. No new complaints.  Denies any constitutional symptoms.  Wife reports that patient is not as active as previously .  Review of Systems  Constitutional: Negative for appetite change, chills, fatigue, fever and unexpected weight change.  HENT:   Negative for hearing loss and voice change.   Eyes: Negative for eye problems and icterus.  Respiratory: Negative for chest tightness, cough and shortness of breath.   Cardiovascular: Negative for chest pain and leg swelling.  Gastrointestinal: Negative for abdominal distention and abdominal pain.  Endocrine: Negative for hot flashes.  Genitourinary: Negative for difficulty urinating, dysuria and frequency.   Musculoskeletal: Positive for arthralgias. Negative for back pain.  Skin: Negative for itching and rash.       Skin cancer  Neurological: Negative for light-headedness and numbness.  Hematological: Negative for adenopathy. Does not bruise/bleed easily.  Psychiatric/Behavioral: Negative for confusion.    MEDICAL HISTORY:  Past Medical History:  Diagnosis Date  . Cancer [facility])    prostate   . CKD (chronic kidney disease) stage 3, GFR 30-59 ml/min (HCC) 10/01/2016  . Complication of anesthesia    bradycardia, in ICU for 24 hour after galbladder surgery.   . Diverticulosis 2012  . Dysrhythmia    Heart skips a beat  . Elevated  lipids   . GERD (gastroesophageal reflux disease)   . History of hiatal hernia   . Hypertension   . Hypothyroidism   . Leukemia ([place])   . Prostate cancer ([place])   . Skin  cancer    face, scalp, behind ear,back and hand  . Tremors of nervous system     SURGICAL HISTORY: Past Surgical History:  Procedure Laterality Date  . BLADDER TUMOR EXCISION    . CARDIAC CATHETERIZATION    . CHOLECYSTECTOMY N/A 10/10/2014   Procedure: LAPAROSCOPIC CHOLECYSTECTOMY WITH INTRAOPERATIVE CHOLANGIOGRAM;  Surgeon: Leonie Green, MD;  Location: [facility] ORS;  Service: General;  Laterality: N/A;  . COLONOSCOPY WITH PROPOFOL N/A 02/16/2017   Procedure: COLONOSCOPY WITH PROPOFOL;  Surgeon: Manya Silvas, MD;  Location: [facility] ENDOSCOPY;  Service: Endoscopy;  Laterality: N/A;  . ESOPHAGOGASTRODUODENOSCOPY (EGD) WITH PROPOFOL N/A 02/16/2017   Procedure: ESOPHAGOGASTRODUODENOSCOPY (EGD) WITH PROPOFOL;  Surgeon: Manya Silvas, MD;  Location: [facility] ENDOSCOPY;  Service: Endoscopy;  Laterality: N/A;  . HEMORRHOID SURGERY    . HERNIA REPAIR Left    x2  . JOINT REPLACEMENT Left    Partial Knee Replacement, Dr. Roland Rack  . PARTIAL KNEE ARTHROPLASTY Left 12/12/2014   Procedure: UNICOMPARTMENTAL KNEE;  Surgeon: Corky Mull, MD;  Location: [facility] ORS;  Service: Orthopedics;  Laterality: Left;  . prostate seeding    . SHOULDER ARTHROSCOPY Right   . SHOULDER ARTHROSCOPY WITH OPEN ROTATOR CUFF REPAIR Left 02/07/2016   Procedure: SHOULDER ARTHROSCOPY WITH OPEN ROTATOR CUFF REPAIR;  Surgeon: Corky Mull, MD;  Location: [facility] ORS;  Service: Orthopedics;  Laterality: Left;    SOCIAL HISTORY: Social History   Socioeconomic History  . Marital status: Married    Spouse name: Not on file  . Number of children: Not on file  . Years of education: Not on file  . Highest education level: Not on file  Occupational History  . Not on file  Tobacco Use  . Smoking status: Never Smoker  . Smokeless tobacco: Never Used  Vaping Use  . Vaping Use: Never used  Substance and Sexual Activity  . Alcohol use: No  . Drug use: No  . Sexual activity: Yes  Other Topics Concern  . Not on file  Social  History Narrative  . Not on file   Social Determinants of Health   Financial Resource Strain: Not on file  Food Insecurity: Not on file  Transportation Needs: Not on file  Physical Activity: Not on file  Stress: Not on file  Social Connections: Not on file  Intimate Partner Violence: Not on file    FAMILY HISTORY: Family History  Problem Relation Age of Onset  . Bone cancer Father   . Hypertension Mother   . Osteoporosis Mother   . Breast cancer Sister   . Cancer Brother   . Cancer Brother   . Lung cancer Brother   . Bladder Cancer Brother   . Melanoma Brother     ALLERGIES:  is allergic to oxycodone-acetaminophen, percocet [oxycodone-acetaminophen], and voltaren [diclofenac sodium].  MEDICATIONS:  Current Outpatient Medications  Medication Sig Dispense Refill  . acetaminophen (TYLENOL) 500 MG tablet Take 1,000 mg by mouth every 8 (eight) hours as needed for moderate pain.    Marland Kitchen Apoaequorin (PREVAGEN PO) Take 1 tablet by mouth daily.    Marland Kitchen aspirin 81 MG tablet Take 81 mg by mouth daily. In am    . b complex vitamins capsule Take 1 capsule by mouth daily.    Marland Kitchen  calcium carbonate (OSCAL) 1500 (600 Ca) MG TABS tablet Take by mouth daily with breakfast.    . Cholecalciferol 25 MCG (1000 UT) tablet Take by mouth.    . DOCOSAHEXAENOIC ACID-EPA PO Take by mouth. 120-80 mg cap    . donepezil (ARICEPT) 5 MG tablet Take by mouth.    . DULoxetine (CYMBALTA) 20 MG capsule Take 20 mg by mouth daily.    . famotidine (PEPCID) 40 MG tablet Take 40 mg by mouth 2 (two) times daily.    . ferrous sulfate 324 MG TBEC Take 324 mg by mouth 3 (three) times daily.     . Flaxseed, Linseed, (FLAX SEED OIL) 1000 MG CAPS Take 1 capsule by mouth daily.    Marland Kitchen gabapentin (NEURONTIN) 100 MG capsule Take 100 mg by mouth 3 (three) times daily.     . Garlic 2202 MG CAPS Take 1 capsule by mouth every morning.    . latanoprost (XALATAN) 0.005 % ophthalmic solution Place 1 drop into both eyes at bedtime.    Marland Kitchen  levothyroxine (SYNTHROID, LEVOTHROID) 25 MCG tablet Take 25 mcg by mouth daily at 12 noon.    . loratadine (CLARITIN REDITABS) 10 MG dissolvable tablet Take 10 mg by mouth daily. In am    . Misc Natural Products (BLACK CHERRY CONCENTRATE) LIQD Take 15 mLs by mouth daily.     Marland Kitchen MISC NATURAL PRODUCTS PO Take by mouth. Beet juice capsules    . Multiple Vitamins-Minerals (MULTIVITAMIN WITH MINERALS) tablet Take 1 tablet by mouth daily. In am    . Omega-3 Fatty Acids (FISH OIL) 1000 MG CAPS Take 1 capsule by mouth daily.    Marland Kitchen ZINC GLUCONATE PO Take by mouth.     No current facility-administered medications for this visit.      Marland Kitchen  PHYSICAL EXAMINATION: ECOG PERFORMANCE STATUS: 1 - Symptomatic but completely ambulatory Vitals:   02/14/20 1017 02/14/20 1046  BP: 114/74   Pulse: (!) 112 (!) 56  Resp: 16   Temp: 97.6 F (36.4 C)    Filed Weights   02/14/20 1017  Weight: 168 lb 12.8 oz (76.6 kg)   Physical Exam Constitutional:      General: He is not in acute distress.    Appearance: He is not diaphoretic.     Comments: Patient walks with a cane  HENT:     Head: Normocephalic and atraumatic.     Nose: Nose normal.     Mouth/Throat:     Pharynx: No oropharyngeal exudate.  Eyes:     General: No scleral icterus.    Pupils: Pupils are equal, round, and reactive to light.  Cardiovascular:     Rate and Rhythm: Normal rate. Rhythm irregular.     Heart sounds: No murmur heard.   Pulmonary:     Effort: Pulmonary effort is normal. No respiratory distress.     Breath sounds: No rales.  Chest:     Chest wall: No tenderness.  Abdominal:     General: There is no distension.     Palpations: Abdomen is soft.     Tenderness: There is no abdominal tenderness.  Musculoskeletal:        General: Normal range of motion.     Cervical back: Normal range of motion and neck supple.  Skin:    General: Skin is warm and dry.     Findings: No erythema.  Neurological:     Mental Status: He is  alert and oriented to person, place, and time.  Psychiatric:        Mood and Affect: Affect normal.   .    LABORATORY DATA:  I have reviewed the data as listed Lab Results  Component Value Date   WBC 7.3 02/14/2020   HGB 15.0 02/14/2020   HCT 45.2 02/14/2020   MCV 104.1 (H) 02/14/2020   PLT 141 (L) 02/14/2020   Recent Labs    06/28/19 1236 09/06/19 0954 11/10/19 1021 12/15/19 2006 02/14/20 0953  NA 139 141 139 139 142  K 4.5 3.9 4.4 4.3 3.7  CL 104 106 102 104 102  CO2 '28 28 29 25 30  ' GLUCOSE 99 108* 122* 152* 98  BUN 28* 30* 26* 36* 19  CREATININE 1.45* 1.40* 1.39* 1.48* 1.38*  CALCIUM 9.4 9.3 9.2 10.2 9.6  GFRNONAA 44* 45* 46* 46* 50*  GFRAA 51* 53* 53*  --   --   PROT 7.0 7.0 7.0 7.2 7.6  ALBUMIN 3.8 3.9 3.5 3.8 4.0  AST '20 21 17 21 23  ' ALT '18 17 15 14 17  ' ALKPHOS 63 60 63 65 62  BILITOT 0.8 0.9 0.8 0.7 0.7    RADIOGRAPHIC STUDIES: I have personally reviewed the radiological images as listed and agreed with the findings in the report. no recent images. 11/04/2017  US abdomen Stable right renal cyst. Status post cholecystectomy.Previously seen lesion within the liver is not well appreciated on this exam. No Splenomegaly.   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  Cytogenetics revealed Trisomy 12 IGVH unmutated.  ASSESSMENT & PLAN:  1. MGUS (monoclonal gammopathy of unknown significance)   2. CLL (chronic lymphocytic leukemia) ([place])   3. Encounter for antineoplastic chemotherapy   4. Stage 3a chronic kidney disease ([place])    # CLL, intermediate risk with trisomy 12. IGVH unmutated. Stage III, extensive bone marrow involvement. Off acalabrutinib 100 mg twice daily Labs are reviewed and discussed with patient Clinically patient is doing well from CLL standpoint view WBC is stable at 7.3, hemoglobin has improved to 15.  Thrombocytopenia has improved.  Discussed with patient and wife that I will continue to hold acalabrutinib for now.   Close monitoring.  If he progresses may resume acalabrutinib or switch to other treatment options.  He continues to have irregular heartbeats, Initially in the clinic, heart rate was measured as 112.  Repeat measurement showed bradycardia with heart rate of 58, irregular heart rhythm-compatible with his history of bradycardia with PACs Recommend patient to continue follow-up with Dr.Callwood  #CKD, creatinine is stable. avoid nephrotoxins.  #MGUS, multiple myeloma panel  Is pending.  Orders Placed This Encounter  Procedures  . CBC with Differential/Platelet    Standing Status:   Future    Standing Expiration Date:   02/13/2021  . Comprehensive metabolic panel    Standing Status:   Future    Standing Expiration Date:   02/13/2021  . Lactate dehydrogenase    Standing Status:   Future    Standing Expiration Date:   02/13/2021  . Flow cytometry panel-leukemia/lymphoma work-up    Standing Status:   Future    Standing Expiration Date:   02/13/2021   Follow up in 3 months  [name], MD, PhD Hematology Oncology [place] [facility] 02/14/2020

## 2020-02-15 LAB — KAPPA/LAMBDA LIGHT CHAINS
Kappa free light chain: 31.7 mg/L — ABNORMAL HIGH (ref 3.3–19.4)
Kappa, lambda light chain ratio: 0.45 (ref 0.26–1.65)
Lambda free light chains: 70.6 mg/L — ABNORMAL HIGH (ref 5.7–26.3)

## 2020-02-16 LAB — MULTIPLE MYELOMA PANEL, SERUM
Albumin SerPl Elph-Mcnc: 3.9 g/dL (ref 2.9–4.4)
Albumin/Glob SerPl: 1.3 (ref 0.7–1.7)
Alpha 1: 0.2 g/dL (ref 0.0–0.4)
Alpha2 Glob SerPl Elph-Mcnc: 0.7 g/dL (ref 0.4–1.0)
B-Globulin SerPl Elph-Mcnc: 0.8 g/dL (ref 0.7–1.3)
Gamma Glob SerPl Elph-Mcnc: 1.4 g/dL (ref 0.4–1.8)
Globulin, Total: 3.1 g/dL (ref 2.2–3.9)
IgA: 55 mg/dL — ABNORMAL LOW (ref 61–437)
IgG (Immunoglobin G), Serum: 1622 mg/dL — ABNORMAL HIGH (ref 603–1613)
IgM (Immunoglobulin M), Srm: 102 mg/dL (ref 15–143)
M Protein SerPl Elph-Mcnc: 1.1 g/dL — ABNORMAL HIGH
Total Protein ELP: 7 g/dL (ref 6.0–8.5)

## 2020-02-19 ENCOUNTER — Other Ambulatory Visit: Payer: Self-pay

## 2020-02-19 ENCOUNTER — Ambulatory Visit (INDEPENDENT_AMBULATORY_CARE_PROVIDER_SITE_OTHER): Payer: Medicare Other

## 2020-02-19 ENCOUNTER — Ambulatory Visit
Admission: EM | Admit: 2020-02-19 | Discharge: 2020-02-19 | Disposition: A | Discharge status: Home / Self Care | Payer: Medicare Other | Attending: Family Medicine | Admitting: Family Medicine

## 2020-02-19 DIAGNOSIS — R509 Fever, unspecified: Secondary | ICD-10-CM

## 2020-02-19 DIAGNOSIS — R059 Cough, unspecified: Secondary | ICD-10-CM | POA: Diagnosis not present

## 2020-02-19 DIAGNOSIS — Z20822 Contact with and (suspected) exposure to covid-19: Secondary | ICD-10-CM | POA: Insufficient documentation

## 2020-02-19 DIAGNOSIS — J209 Acute bronchitis, unspecified: Secondary | ICD-10-CM | POA: Insufficient documentation

## 2020-02-19 MED ORDER — BENZONATATE 200 MG PO CAPS
200.0000 mg | ORAL_CAPSULE | Freq: Three times a day (TID) | ORAL | 0 refills | Status: DC | PRN
Start: 2020-02-19 — End: 2020-05-05

## 2020-02-19 MED ORDER — DOXYCYCLINE HYCLATE 100 MG PO CAPS: 100.0000 mg | ORAL_CAPSULE | Freq: Two times a day (BID) | ORAL | 0 refills | Status: DC

## 2020-02-19 MED ORDER — BENZONATATE 200 MG PO CAPS: 200.0000 mg | ORAL_CAPSULE | Freq: Three times a day (TID) | ORAL | 0 refills | Status: DC | PRN

## 2020-02-19 NOTE — ED Triage Notes (Signed)
Patient complains of cough and nasal congestion with productive mucus since Wednesday. Patient has not been covid tested since onset. Patient has been vaccinated.

## 2020-02-19 NOTE — ED Provider Notes (Signed)
[facility]    CSN: [id number] Arrival date & time: 02/19/20  1145  History   Chief Complaint Chief Complaint  Patient presents with  . Sore Throat    [phone]   HPI   87 year old male presents with cough.  Patient reports ongoing nasal congestion but is predominantly bothered by productive cough.  He states that he is producing thick brown mucus.  Started on Wednesday.  He is having difficulty sleeping due to cough.  No fever.  He has not been Covid tested.  No reported sick contacts with COVID-19.  He has been vaccinated.  No shortness of breath.  No fever.  No other complaints.  Past Medical History:  Diagnosis Date  . Cancer [facility])    prostate   . CKD (chronic kidney disease) stage 3, GFR 30-59 ml/min (HCC) 10/01/2016  . Complication of anesthesia    bradycardia, in ICU for 24 hour after galbladder surgery.   . Diverticulosis 2012  . Dysrhythmia    Heart skips a beat  . Elevated lipids   . GERD (gastroesophageal reflux disease)   . History of hiatal hernia   . Hypertension   . Hypothyroidism   . Leukemia (HCC)   . Prostate cancer (HCC)   . Skin cancer    face, scalp, behind ear,back and hand  . Tremors of nervous system     Patient Active Problem List   Diagnosis Date Noted  . Hiatal hernia 12/16/2019  . Postural dizziness with presyncope 12/16/2019  . New onset Atrial fibrillation with slow ventricular response (HCC) 12/16/2019  . Chest pain 12/16/2019  . Encounter for antineoplastic chemotherapy 10/22/2018  . Goals of care, counseling/discussion 09/28/2018  . Thrombocytopenia (HCC) 10/27/2016  . Iron deficiency 10/27/2016  . CLL (chronic lymphocytic leukemia) (HCC) 10/27/2016  . Macrocytic anemia 10/27/2016  . Acquired hypothyroidism 10/01/2016  . Chronic kidney disease (CKD) stage G3a/A1, moderately decreased glomerular filtration rate (GFR) between 45-59 mL/min/1.73 square meter and albuminuria creatinine ratio less than 30 mg/g (HCC)  10/01/2016  . Status post left partial knee replacement 12/12/2014  . Bradycardia 10/10/2014  . Severe sinus bradycardia 10/10/2014  . Benign essential hypertension 05/02/2013  . Diabetes mellitus (HCC) 05/02/2013  . Hyperlipidemia, unspecified 05/02/2013    Past Surgical History:  Procedure Laterality Date  . BLADDER TUMOR EXCISION    . CARDIAC CATHETERIZATION    . CHOLECYSTECTOMY N/A 10/10/2014   Procedure: LAPAROSCOPIC CHOLECYSTECTOMY WITH INTRAOPERATIVE CHOLANGIOGRAM;  Surgeon: Nadeen Landau, MD;  Location: [facility] ORS;  Service: General;  Laterality: N/A;  . COLONOSCOPY WITH PROPOFOL N/A 02/16/2017   Procedure: COLONOSCOPY WITH PROPOFOL;  Surgeon: Scot Jun, MD;  Location: [facility] ENDOSCOPY;  Service: Endoscopy;  Laterality: N/A;  . ESOPHAGOGASTRODUODENOSCOPY (EGD) WITH PROPOFOL N/A 02/16/2017   Procedure: ESOPHAGOGASTRODUODENOSCOPY (EGD) WITH PROPOFOL;  Surgeon: Scot Jun, MD;  Location: [facility] ENDOSCOPY;  Service: Endoscopy;  Laterality: N/A;  . HEMORRHOID SURGERY    . HERNIA REPAIR Left    x2  . JOINT REPLACEMENT Left    Partial Knee Replacement, Dr. Joice Lofts  . PARTIAL KNEE ARTHROPLASTY Left 12/12/2014   Procedure: UNICOMPARTMENTAL KNEE;  Surgeon: Christena Flake, MD;  Location: [facility] ORS;  Service: Orthopedics;  Laterality: Left;  . prostate seeding    . SHOULDER ARTHROSCOPY Right   . SHOULDER ARTHROSCOPY WITH OPEN ROTATOR CUFF REPAIR Left 02/07/2016   Procedure: SHOULDER ARTHROSCOPY WITH OPEN ROTATOR CUFF REPAIR;  Surgeon: Christena Flake, MD;  Location: [facility] ORS;  Service: Orthopedics;  Laterality: Left;  Home Medications    Prior to Admission medications   Medication Sig Start Date End Date Taking? Authorizing Provider  acetaminophen (TYLENOL) 500 MG tablet Take 1,000 mg by mouth every 8 (eight) hours as needed for moderate pain.   Yes [provider]  Apoaequorin (PREVAGEN PO) Take 1 tablet by mouth daily.   Yes [provider]  aspirin  81 MG tablet Take 81 mg by mouth daily. In am   Yes [provider]  b complex vitamins capsule Take 1 capsule by mouth daily.   Yes [provider]  calcium carbonate (OSCAL) 1500 (600 Ca) MG TABS tablet Take by mouth daily with breakfast.   Yes [provider]  Cholecalciferol 25 MCG (1000 UT) tablet Take by mouth.   Yes [provider]  DOCOSAHEXAENOIC ACID-EPA PO Take by mouth. 120-80 mg cap   Yes [provider]  donepezil (ARICEPT) 5 MG tablet Take by mouth. 01/24/20 01/23/21 Yes [provider]  doxycycline (VIBRAMYCIN) 100 MG capsule Take 1 capsule (100 mg total) by mouth 2 (two) times daily. 02/19/20  Yes Lailany Enoch G, DO  DULoxetine (CYMBALTA) 20 MG capsule Take 20 mg by mouth daily. 06/08/19  Yes [provider]  famotidine (PEPCID) 40 MG tablet Take 40 mg by mouth 2 (two) times daily. 04/28/19  Yes [provider]  ferrous sulfate 324 MG TBEC Take 324 mg by mouth 3 (three) times daily.    Yes [provider]  Flaxseed, Linseed, (FLAX SEED OIL) 1000 MG CAPS Take 1 capsule by mouth daily.   Yes [provider]  gabapentin (NEURONTIN) 100 MG capsule Take 100 mg by mouth 3 (three) times daily.  02/15/18 02/19/20 Yes [provider]  Garlic 123XX123 MG CAPS Take 1 capsule by mouth every morning.   Yes [provider]  latanoprost (XALATAN) 0.005 % ophthalmic solution Place 1 drop into both eyes at bedtime.   Yes [provider]  loratadine (CLARITIN REDITABS) 10 MG dissolvable tablet Take 10 mg by mouth daily. In am   Yes [provider]  Misc Natural Products (BLACK CHERRY CONCENTRATE) LIQD Take 15 mLs by mouth daily.    Yes [provider]  MISC NATURAL PRODUCTS PO Take by mouth. Beet juice capsules   Yes [provider]  Multiple Vitamins-Minerals (MULTIVITAMIN WITH MINERALS) tablet Take 1 tablet by mouth daily. In am   Yes [provider]  Omega-3  Fatty Acids (FISH OIL) 1000 MG CAPS Take 1 capsule by mouth daily.   Yes [provider]  ZINC GLUCONATE PO Take by mouth.   Yes [provider]  benzonatate (TESSALON) 200 MG capsule Take 1 capsule (200 mg total) by mouth 3 (three) times daily as needed for cough. 02/19/20   Coral Spikes, DO  levothyroxine (SYNTHROID, LEVOTHROID) 25 MCG tablet Take 25 mcg by mouth daily at 12 noon. 04/02/16   [provider]    Family History Family History  Problem Relation Age of Onset  . Bone cancer Father   . Hypertension Mother   . Osteoporosis Mother   . Breast cancer Sister   . Cancer Brother   . Cancer Brother   . Lung cancer Brother   . Bladder Cancer Brother   . Melanoma Brother     Social History Social History   Tobacco Use  . Smoking status: Never Smoker  . Smokeless tobacco: Never Used  Vaping Use  . Vaping Use: Never used  Substance Use  Topics  . Alcohol use: No  . Drug use: No     Allergies   Oxycodone-acetaminophen, Percocet [oxycodone-acetaminophen], and Voltaren [diclofenac sodium]   Review of Systems Review of Systems  Constitutional: Negative for fever.  HENT: Positive for congestion.   Respiratory: Positive for cough.    Physical Exam Triage Vital Signs ED Triage Vitals  Enc Vitals Group     BP 02/19/20 1514 (!) 157/79     Pulse Rate 02/19/20 1514 61     Resp 02/19/20 1514 16     Temp 02/19/20 1514 98.3 F (36.8 C)     Temp Source 02/19/20 1514 Oral     SpO2 02/19/20 1514 99 %     Weight 02/19/20 1435 168 lb 14 oz (76.6 kg)     Height 02/19/20 1435 5\' 9"  (1.753 m)     Head Circumference --      Peak Flow --      Pain Score 02/19/20 1434 8     Pain Loc --      Pain Edu? --      Excl. in [place]? --    Updated Vital Signs BP (!) 157/79 (BP Location: Left Arm)   Pulse 61 Comment: irregular  Temp 98.3 F (36.8 C) (Oral)   Resp 16   Ht 5\' 9"  (1.753 m)   Wt 76.6 kg   SpO2 99%   BMI 24.94 kg/m   Visual Acuity Right Eye  Distance:   Left Eye Distance:   Bilateral Distance:    Right Eye Near:   Left Eye Near:    Bilateral Near:     Physical Exam Vitals and nursing note reviewed.  Constitutional:      General: He is not in acute distress.    Appearance: Normal appearance. He is not ill-appearing.  HENT:     Head: Normocephalic and atraumatic.  Eyes:     General:        Right eye: No discharge.        Left eye: No discharge.     Conjunctiva/sclera: Conjunctivae normal.  Cardiovascular:     Rate and Rhythm: Normal rate and regular rhythm.     Comments: Ectopy noted. Pulmonary:     Effort: Pulmonary effort is normal.     Comments: Coarse breath sounds. Neurological:     Mental Status: He is alert.  Psychiatric:        Mood and Affect: Mood normal.        Behavior: Behavior normal.    UC Treatments / Results  Labs (all labs ordered are listed, but only abnormal results are displayed) Labs Reviewed  SARS CORONAVIRUS 2 (TAT 6-24 HRS)    EKG   Radiology DG Chest 2 View  Result Date: 02/19/2020 CLINICAL DATA:  Cough and fever. EXAM: CHEST - 2 VIEW COMPARISON:  December 15, 2019 FINDINGS: The heart size and mediastinal contours are within normal limits. Both lungs are clear. Hiatal hernia is identified. Degenerative joint changes of bilateral shoulders and spine are noted. IMPRESSION: No active cardiopulmonary disease. Electronically Signed   By: Abelardo Diesel M.D.   On: 02/19/2020 15:52    Procedures Procedures (including critical care time)  Medications Ordered in UC Medications - No data to display  Initial Impression / Assessment and Plan / UC Course  I have reviewed the triage vital signs and the nursing notes.  Pertinent labs & imaging results that were available during my care of the patient were reviewed by me and  considered in my medical decision making (see chart for details).    87 year old male presents with acute bronchitis.  Chest x-ray was obtained.  Chest x-ray  independently reviewed.  Patient has a large hiatal hernia.  No infiltrate seen.  Placing on doxycycline.  Tessalon Perles for cough.  Final Clinical Impressions(s) / UC Diagnoses   Final diagnoses:  Acute bronchitis, unspecified organism   Discharge Instructions   None    ED Prescriptions    Medication Sig Dispense Auth. Provider   doxycycline (VIBRAMYCIN) 100 MG capsule Take 1 capsule (100 mg total) by mouth 2 (two) times daily. 14 capsule Takota Cahalan G, DO   benzonatate (TESSALON) 200 MG capsule  (Status: Discontinued) Take 1 capsule (200 mg total) by mouth 3 (three) times daily as needed for cough. 30 capsule Lochlin Eppinger G, DO   benzonatate (TESSALON) 200 MG capsule Take 1 capsule (200 mg total) by mouth 3 (three) times daily as needed for cough. 30 capsule Coral Spikes, DO     PDMP not reviewed this encounter.   Coral Spikes, [place] 02/19/20 1840

## 2020-02-20 LAB — SARS CORONAVIRUS 2 (TAT 6-24 HRS): SARS Coronavirus 2: NEGATIVE

## 2020-02-29 ENCOUNTER — Encounter: Payer: Self-pay | Admitting: Oncology

## 2020-03-06 ENCOUNTER — Telehealth: Payer: Self-pay

## 2020-03-06 DIAGNOSIS — D472 Monoclonal gammopathy: Secondary | ICD-10-CM

## 2020-03-06 DIAGNOSIS — C9110 Chronic lymphocytic leukemia of B-cell type not having achieved remission: Secondary | ICD-10-CM

## 2020-03-06 NOTE — Telephone Encounter (Signed)
-----   Message from Rickard Patience, MD sent at 02/29/2020 12:53 PM EST ----- Please arrange him to repeat CBC, flow cytometry, multiple myeloma panel in 4 weeks. I just talked to him and he knows about the plan.

## 2020-03-06 NOTE — Telephone Encounter (Signed)
Done..  Pts wife was made aware of his sched lab appt on 04/03/20 @ 1:30

## 2020-03-06 NOTE — Telephone Encounter (Signed)
Please schedule and notify patient.

## 2020-03-12 ENCOUNTER — Other Ambulatory Visit: Payer: Medicare Other

## 2020-04-03 ENCOUNTER — Inpatient Hospital Stay: Payer: Medicare Other | Attending: Oncology

## 2020-04-03 DIAGNOSIS — M2550 Pain in unspecified joint: Secondary | ICD-10-CM | POA: Diagnosis not present

## 2020-04-03 DIAGNOSIS — Z803 Family history of malignant neoplasm of breast: Secondary | ICD-10-CM | POA: Diagnosis not present

## 2020-04-03 DIAGNOSIS — Z809 Family history of malignant neoplasm, unspecified: Secondary | ICD-10-CM | POA: Insufficient documentation

## 2020-04-03 DIAGNOSIS — Z79899 Other long term (current) drug therapy: Secondary | ICD-10-CM | POA: Diagnosis not present

## 2020-04-03 DIAGNOSIS — Z801 Family history of malignant neoplasm of trachea, bronchus and lung: Secondary | ICD-10-CM | POA: Insufficient documentation

## 2020-04-03 DIAGNOSIS — Z8262 Family history of osteoporosis: Secondary | ICD-10-CM | POA: Diagnosis not present

## 2020-04-03 DIAGNOSIS — I4891 Unspecified atrial fibrillation: Secondary | ICD-10-CM | POA: Diagnosis not present

## 2020-04-03 DIAGNOSIS — Z8052 Family history of malignant neoplasm of bladder: Secondary | ICD-10-CM | POA: Insufficient documentation

## 2020-04-03 DIAGNOSIS — R5383 Other fatigue: Secondary | ICD-10-CM | POA: Diagnosis not present

## 2020-04-03 DIAGNOSIS — Z8249 Family history of ischemic heart disease and other diseases of the circulatory system: Secondary | ICD-10-CM | POA: Insufficient documentation

## 2020-04-03 DIAGNOSIS — Z8719 Personal history of other diseases of the digestive system: Secondary | ICD-10-CM | POA: Diagnosis not present

## 2020-04-03 DIAGNOSIS — N1831 Chronic kidney disease, stage 3a: Secondary | ICD-10-CM | POA: Insufficient documentation

## 2020-04-03 DIAGNOSIS — R079 Chest pain, unspecified: Secondary | ICD-10-CM | POA: Diagnosis not present

## 2020-04-03 DIAGNOSIS — D509 Iron deficiency anemia, unspecified: Secondary | ICD-10-CM | POA: Diagnosis not present

## 2020-04-03 DIAGNOSIS — Z885 Allergy status to narcotic agent status: Secondary | ICD-10-CM | POA: Insufficient documentation

## 2020-04-03 DIAGNOSIS — C9110 Chronic lymphocytic leukemia of B-cell type not having achieved remission: Secondary | ICD-10-CM | POA: Diagnosis present

## 2020-04-03 DIAGNOSIS — Z9049 Acquired absence of other specified parts of digestive tract: Secondary | ICD-10-CM | POA: Insufficient documentation

## 2020-04-03 DIAGNOSIS — D696 Thrombocytopenia, unspecified: Secondary | ICD-10-CM | POA: Insufficient documentation

## 2020-04-03 DIAGNOSIS — K648 Other hemorrhoids: Secondary | ICD-10-CM | POA: Diagnosis not present

## 2020-04-03 DIAGNOSIS — Z808 Family history of malignant neoplasm of other organs or systems: Secondary | ICD-10-CM | POA: Diagnosis not present

## 2020-04-03 DIAGNOSIS — D472 Monoclonal gammopathy: Secondary | ICD-10-CM | POA: Diagnosis not present

## 2020-04-03 DIAGNOSIS — D7589 Other specified diseases of blood and blood-forming organs: Secondary | ICD-10-CM | POA: Insufficient documentation

## 2020-04-03 LAB — CBC WITH DIFFERENTIAL/PLATELET
Abs Immature Granulocytes: 0.04 10*3/uL (ref 0.00–0.07)
Basophils Absolute: 0.1 10*3/uL (ref 0.0–0.1)
Basophils Relative: 1 %
Eosinophils Absolute: 0.3 10*3/uL (ref 0.0–0.5)
Eosinophils Relative: 4 %
HCT: 43.2 % (ref 39.0–52.0)
Hemoglobin: 14.6 g/dL (ref 13.0–17.0)
Immature Granulocytes: 1 %
Lymphocytes Relative: 45 %
Lymphs Abs: 3 10*3/uL (ref 0.7–4.0)
MCH: 34.1 pg — ABNORMAL HIGH (ref 26.0–34.0)
MCHC: 33.8 g/dL (ref 30.0–36.0)
MCV: 100.9 fL — ABNORMAL HIGH (ref 80.0–100.0)
Monocytes Absolute: 0.6 10*3/uL (ref 0.1–1.0)
Monocytes Relative: 8 %
Neutro Abs: 2.8 10*3/uL (ref 1.7–7.7)
Neutrophils Relative %: 41 %
Platelets: 141 10*3/uL — ABNORMAL LOW (ref 150–400)
RBC: 4.28 MIL/uL (ref 4.22–5.81)
RDW: 13 % (ref 11.5–15.5)
WBC: 6.7 10*3/uL (ref 4.0–10.5)
nRBC: 0 % (ref 0.0–0.2)

## 2020-04-05 LAB — COMP PANEL: LEUKEMIA/LYMPHOMA: Immunophenotypic Profile: 15

## 2020-04-06 LAB — MULTIPLE MYELOMA PANEL, SERUM
Albumin SerPl Elph-Mcnc: 3.7 g/dL (ref 2.9–4.4)
Albumin/Glob SerPl: 1.3 (ref 0.7–1.7)
Alpha 1: 0.2 g/dL (ref 0.0–0.4)
Alpha2 Glob SerPl Elph-Mcnc: 0.7 g/dL (ref 0.4–1.0)
B-Globulin SerPl Elph-Mcnc: 0.8 g/dL (ref 0.7–1.3)
Gamma Glob SerPl Elph-Mcnc: 1.4 g/dL (ref 0.4–1.8)
Globulin, Total: 3 g/dL (ref 2.2–3.9)
IgA: 55 mg/dL — ABNORMAL LOW (ref 61–437)
IgG (Immunoglobin G), Serum: 1524 mg/dL (ref 603–1613)
IgM (Immunoglobulin M), Srm: 100 mg/dL (ref 15–143)
M Protein SerPl Elph-Mcnc: 1 g/dL — ABNORMAL HIGH
Total Protein ELP: 6.7 g/dL (ref 6.0–8.5)

## 2020-04-16 ENCOUNTER — Encounter: Payer: Self-pay | Admitting: Oncology

## 2020-04-16 ENCOUNTER — Inpatient Hospital Stay (HOSPITAL_BASED_OUTPATIENT_CLINIC_OR_DEPARTMENT_OTHER): Payer: Medicare Other | Admitting: Oncology

## 2020-04-16 VITALS — BP 145/75 | HR 63 | Temp 97.6°F | Resp 18 | Wt 171.0 lb

## 2020-04-16 DIAGNOSIS — C9110 Chronic lymphocytic leukemia of B-cell type not having achieved remission: Secondary | ICD-10-CM

## 2020-04-16 DIAGNOSIS — D472 Monoclonal gammopathy: Secondary | ICD-10-CM | POA: Diagnosis not present

## 2020-04-16 DIAGNOSIS — D696 Thrombocytopenia, unspecified: Secondary | ICD-10-CM

## 2020-04-16 DIAGNOSIS — N1831 Chronic kidney disease, stage 3a: Secondary | ICD-10-CM | POA: Diagnosis not present

## 2020-04-16 NOTE — Progress Notes (Signed)
Hematology/Oncology Follow Up Note [facility] [phone] [phone]  CONSULT NOTE Patient Care Team: Tracie Harrier, MD as PCP - General (Internal Medicine)  REASON FOR VISIT  follow up for CLL and iron deficiency anemia  HISTORY OF PRESENTING ILLNESS:  Allen Chang 87 y.o.  male with PMH listed as below who was referred by primary care provider to me for evaluation of leukocytosis. He has a history of prostate cancer which was diagnosed 10 years ago. He underwent brachytherapy. He is not any castration treatment. Per patient, he follows up with Allen Chang and most recent PSA is normal.   Patient also reports feeling fatigue lately. Recent labs show leukocytosis, mild anemia, macrocytosis, mild thrombocytopenia, low ferritin. Denies blood in the stool. He was started on over the counter iron supplement but wife who manages patient's medication decides to only let patient to take iron medication every other day.   # received IV iron Venofer x 4. Colonoscopy was done on 02/16/2017 which showed polyps, internal hemorrhoids, negative for malignancy.  ##Patient had bone marrow biopsy on 09/22/2018 Pathology showed hypercellular bone marrow with extensive involvement by chronic lymphocytic leukemia. # Started on acalabrutinib 100 mg twice daily on 10/01/2018. 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  #August 2020-acalabrutinib 100 mg twice daily Stopped in October 2021 during his admission.  # admitted from 12/16/19- 10/30/ 2021 due to sudden onset chest pain. Troponins were negative in the emergency room.  Initial EKG showed atrial fibrillation with heart rate of 55. Patient was seen by cardiology and clarified that the EKG and telemetry actually showed sinus rhythm with PACs patient did not require any anticoagulation.Echocardiogram showed normal EF and no wall motion abnormality.  Nuclear stress testing showed low risk study,  EF 63%. Patient was discharged home and followed up with Dr. Clayborn Chang on January 26, 2020   [place] is a 87 y.o. male who has above history reviewed by me presents for 4 months follow up for CLL and iron deficiency anemia, MGUS Currently off of acalabrutinib since October 2021 admission.  Today patient was accompanied by his wife to discuss about recent blood work.   Review of Systems  Constitutional: Negative for appetite change, chills, fatigue, fever and unexpected weight change.  HENT:   Negative for hearing loss and voice change.   Eyes: Negative for eye problems and icterus.  Respiratory: Negative for chest tightness, cough and shortness of breath.   Cardiovascular: Negative for chest pain and leg swelling.  Gastrointestinal: Negative for abdominal distention and abdominal pain.  Endocrine: Negative for hot flashes.  Genitourinary: Negative for difficulty urinating, dysuria and frequency.   Musculoskeletal: Positive for arthralgias. Negative for back pain.  Skin: Negative for itching and rash.       Skin cancer  Neurological: Negative for light-headedness and numbness.  Hematological: Negative for adenopathy. Does not bruise/bleed easily.  Psychiatric/Behavioral: Negative for confusion.    MEDICAL HISTORY:  Past Medical History:  Diagnosis Date  . Cancer [facility])    prostate   . CKD (chronic kidney disease) stage 3, GFR 30-59 ml/min (HCC) 10/01/2016  . Complication of anesthesia    bradycardia, in ICU for 24 hour after galbladder surgery.   . Diverticulosis 2012  . Dysrhythmia    Heart skips a beat  . Elevated lipids   . GERD (gastroesophageal reflux disease)   . History of hiatal hernia   . Hypertension   . Hypothyroidism   .  Leukemia ([place])   . Prostate cancer ([place])   . Skin cancer    face, scalp, behind ear,back and hand  . Tremors of nervous system     SURGICAL HISTORY: Past Surgical History:  Procedure Laterality Date  . BLADDER TUMOR  EXCISION    . CARDIAC CATHETERIZATION    . CHOLECYSTECTOMY N/A 10/10/2014   Procedure: LAPAROSCOPIC CHOLECYSTECTOMY WITH INTRAOPERATIVE CHOLANGIOGRAM;  Surgeon: Leonie Green, MD;  Location: [facility] ORS;  Service: General;  Laterality: N/A;  . COLONOSCOPY WITH PROPOFOL N/A 02/16/2017   Procedure: COLONOSCOPY WITH PROPOFOL;  Surgeon: Manya Silvas, MD;  Location: [facility] ENDOSCOPY;  Service: Endoscopy;  Laterality: N/A;  . ESOPHAGOGASTRODUODENOSCOPY (EGD) WITH PROPOFOL N/A 02/16/2017   Procedure: ESOPHAGOGASTRODUODENOSCOPY (EGD) WITH PROPOFOL;  Surgeon: Manya Silvas, MD;  Location: [facility] ENDOSCOPY;  Service: Endoscopy;  Laterality: N/A;  . HEMORRHOID SURGERY    . HERNIA REPAIR Left    x2  . JOINT REPLACEMENT Left    Partial Knee Replacement, Dr. Roland Rack  . PARTIAL KNEE ARTHROPLASTY Left 12/12/2014   Procedure: UNICOMPARTMENTAL KNEE;  Surgeon: Corky Mull, MD;  Location: [facility] ORS;  Service: Orthopedics;  Laterality: Left;  . prostate seeding    . SHOULDER ARTHROSCOPY Right   . SHOULDER ARTHROSCOPY WITH OPEN ROTATOR CUFF REPAIR Left 02/07/2016   Procedure: SHOULDER ARTHROSCOPY WITH OPEN ROTATOR CUFF REPAIR;  Surgeon: Corky Mull, MD;  Location: [facility] ORS;  Service: Orthopedics;  Laterality: Left;    SOCIAL HISTORY: Social History   Socioeconomic History  . Marital status: Married    Spouse name: Not on file  . Number of children: Not on file  . Years of education: Not on file  . Highest education level: Not on file  Occupational History  . Not on file  Tobacco Use  . Smoking status: Never Smoker  . Smokeless tobacco: Never Used  Vaping Use  . Vaping Use: Never used  Substance and Sexual Activity  . Alcohol use: No  . Drug use: No  . Sexual activity: Yes  Other Topics Concern  . Not on file  Social History Narrative  . Not on file   Social Determinants of Health   Financial Resource Strain: Not on file  Food Insecurity: Not on file  Transportation Needs: Not on  file  Physical Activity: Not on file  Stress: Not on file  Social Connections: Not on file  Intimate Partner Violence: Not on file    FAMILY HISTORY: Family History  Problem Relation Age of Onset  . Bone cancer Father   . Hypertension Mother   . Osteoporosis Mother   . Breast cancer Sister   . Cancer Brother   . Cancer Brother   . Lung cancer Brother   . Bladder Cancer Brother   . Melanoma Brother     ALLERGIES:  is allergic to oxycodone-acetaminophen, percocet [oxycodone-acetaminophen], and voltaren [diclofenac sodium].  MEDICATIONS:  Current Outpatient Medications  Medication Sig Dispense Refill  . acetaminophen (TYLENOL) 500 MG tablet Take 1,000 mg by mouth every 8 (eight) hours as needed for moderate pain.    Marland Kitchen ascorbic acid (VITAMIN C) 1000 MG tablet Take 1,000 mg by mouth daily.    Marland Kitchen aspirin 81 MG tablet Take 81 mg by mouth daily. In am    . b complex vitamins capsule Take 1 capsule by mouth daily.    . calcium carbonate (OSCAL) 1500 (600 Ca) MG TABS tablet Take by mouth daily with breakfast.    . DOCOSAHEXAENOIC ACID-EPA PO  Take by mouth. 120-80 mg cap    . donepezil (ARICEPT) 5 MG tablet Take by mouth.    . DULoxetine (CYMBALTA) 20 MG capsule Take 20 mg by mouth daily.    . famotidine (PEPCID) 40 MG tablet Take 40 mg by mouth 2 (two) times daily.    . ferrous sulfate 324 MG TBEC Take 324 mg by mouth 3 (three) times daily.     . Flaxseed, Linseed, (FLAX SEED OIL) 1000 MG CAPS Take 1 capsule by mouth daily.    Marland Kitchen gabapentin (NEURONTIN) 100 MG capsule Take 100 mg by mouth 3 (three) times daily.     . Garlic 1660 MG CAPS Take 1 capsule by mouth every morning.    . latanoprost (XALATAN) 0.005 % ophthalmic solution Place 1 drop into both eyes at bedtime.    Marland Kitchen levothyroxine (SYNTHROID, LEVOTHROID) 25 MCG tablet Take 25 mcg by mouth daily at 12 noon.    . loratadine (CLARITIN REDITABS) 10 MG dissolvable tablet Take 10 mg by mouth daily. In am    . Misc Natural Products (BLACK  CHERRY CONCENTRATE) LIQD Take 15 mLs by mouth daily.     Marland Kitchen MISC NATURAL PRODUCTS PO Take by mouth. Beet juice capsules    . Multiple Vitamins-Minerals (MULTIVITAMIN WITH MINERALS) tablet Take 1 tablet by mouth daily. In am    . Omega-3 Fatty Acids (FISH OIL) 1000 MG CAPS Take 1 capsule by mouth daily.    . primidone (MYSOLINE) 50 MG tablet Take 1 tablet by mouth 2 (two) times daily.    Marland Kitchen ZINC GLUCONATE PO Take by mouth.    . benzonatate (TESSALON) 200 MG capsule Take 1 capsule (200 mg total) by mouth 3 (three) times daily as needed for cough. (Patient not taking: Reported on 04/16/2020) 30 capsule 0  . Cholecalciferol 25 MCG (1000 UT) tablet Take by mouth. (Patient not taking: Reported on 04/16/2020)    . doxycycline (VIBRAMYCIN) 100 MG capsule Take 1 capsule (100 mg total) by mouth 2 (two) times daily. (Patient not taking: Reported on 04/16/2020) 14 capsule 0   No current facility-administered medications for this visit.      Marland Kitchen  PHYSICAL EXAMINATION: ECOG PERFORMANCE STATUS: 1 - Symptomatic but completely ambulatory Vitals:   04/16/20 1044  BP: (!) 145/75  Pulse: 63  Resp: 18  Temp: 97.6 F (36.4 C)   Filed Weights   04/16/20 1044  Weight: 171 lb (77.6 kg)   Physical Exam Constitutional:      General: He is not in acute distress.    Appearance: He is not diaphoretic.     Comments: Patient walks with a cane  HENT:     Head: Normocephalic and atraumatic.     Nose: Nose normal.     Mouth/Throat:     Pharynx: No oropharyngeal exudate.  Eyes:     General: No scleral icterus.    Pupils: Pupils are equal, round, and reactive to light.  Cardiovascular:     Rate and Rhythm: Normal rate. Rhythm irregular.     Heart sounds: No murmur heard.   Pulmonary:     Effort: Pulmonary effort is normal. No respiratory distress.     Breath sounds: No rales.  Chest:     Chest wall: No tenderness.  Abdominal:     General: There is no distension.     Palpations: Abdomen is soft.      Tenderness: There is no abdominal tenderness.  Musculoskeletal:        General:  Normal range of motion.     Cervical back: Normal range of motion and neck supple.  Skin:    General: Skin is warm and dry.     Findings: No erythema.  Neurological:     Mental Status: He is alert and oriented to person, place, and time.  Psychiatric:        Mood and Affect: Affect normal.   .    LABORATORY DATA:  I have reviewed the data as listed Lab Results  Component Value Date   WBC 6.7 04/03/2020   HGB 14.6 04/03/2020   HCT 43.2 04/03/2020   MCV 100.9 (H) 04/03/2020   PLT 141 (L) 04/03/2020   Recent Labs    06/28/19 1236 09/06/19 0954 11/10/19 1021 12/15/19 2006 02/14/20 0953  NA 139 141 139 139 142  K 4.5 3.9 4.4 4.3 3.7  CL 104 106 102 104 102  CO2 '28 28 29 25 30  ' GLUCOSE 99 108* 122* 152* 98  BUN 28* 30* 26* 36* 19  CREATININE 1.45* 1.40* 1.39* 1.48* 1.38*  CALCIUM 9.4 9.3 9.2 10.2 9.6  GFRNONAA 44* 45* 46* 46* 50*  GFRAA 51* 53* 53*  --   --   PROT 7.0 7.0 7.0 7.2 7.6  ALBUMIN 3.8 3.9 3.5 3.8 4.0  AST '20 21 17 21 23  ' ALT '18 17 15 14 17  ' ALKPHOS 63 60 63 65 62  BILITOT 0.8 0.9 0.8 0.7 0.7    RADIOGRAPHIC STUDIES: I have personally reviewed the radiological images as listed and agreed with the findings in the report. no recent images. 11/04/2017  US abdomen Stable right renal cyst. Status post cholecystectomy.Previously seen lesion within the liver is not well appreciated on this exam. No Splenomegaly.   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  Cytogenetics revealed Trisomy 12 IGVH unmutated.  ASSESSMENT & PLAN:  1. MGUS (monoclonal gammopathy of unknown significance)   2. CLL (chronic lymphocytic leukemia) (HCC)   3. Stage 3a chronic kidney disease ([place])   4. Thrombocytopenia ([place])    Per patient's request, I also called patient's daughter Allen Chang and updated her. # CLL, intermediate risk with trisomy 12. IGVH unmutated. Stage III, extensive  bone marrow involvement. Off acalabrutinib 100 mg twice daily Labs are reviewed and discussed with patient.  WBC 6.7, normal lymphocyte count.  Stable disease. I recommend patient to remain off CLL treatment.  Continue watchful waiting.  Thrombocytopenia mild at 141,000.  MGUS, IgG lambda 02/14/2020 M protein increased from 2.3 to1.1. 04/03/2020, M protein 1.1. I discussed with patient about the diagnosis of IgG MGUS which is an asymptomatic condition which has a small risk of progression to smoldering multiple myeloma and to symptomatic multiple myeloma. Less frequently, these patients progress to AL amyloidosis, light chain deposition disease, or another lymphoproliferative disorder. For now I recommend observation.repeat multiple myeloma panel in 3 months.    #CKD, creatinine is stable. avoid nephrotoxins.  Orders Placed This Encounter  Procedures  . CBC with Differential/Platelet    Standing Status:   Future    Standing Expiration Date:   04/16/2021  . Comprehensive metabolic panel    Standing Status:   Future    Standing Expiration Date:   04/16/2021  . Lactate dehydrogenase    Standing Status:   Future    Standing Expiration Date:   04/16/2021  . Multiple Myeloma Panel (SPEP&IFE w/QIG)    Standing Status:   Future    Standing Expiration Date:   04/16/2021  .  Kappa/lambda light chains    Standing Status:   Future    Standing Expiration Date:   04/16/2021   Follow up in 3 months  [name], MD, PhD Hematology Oncology [place] [facility] 04/16/2020

## 2020-04-16 NOTE — Progress Notes (Signed)
Patient here for follow up

## 2020-05-03 ENCOUNTER — Inpatient Hospital Stay
Admission: EM | Admit: 2020-05-03 | Discharge: 2020-05-05 | DRG: 378 | Disposition: A | Discharge status: Home / Self Care | Payer: Medicare Other | Attending: Internal Medicine | Admitting: Internal Medicine

## 2020-05-03 ENCOUNTER — Emergency Department: Payer: Medicare Other

## 2020-05-03 ENCOUNTER — Encounter: Payer: Self-pay | Admitting: Radiology

## 2020-05-03 ENCOUNTER — Other Ambulatory Visit: Payer: Self-pay

## 2020-05-03 DIAGNOSIS — E785 Hyperlipidemia, unspecified: Secondary | ICD-10-CM | POA: Diagnosis present

## 2020-05-03 DIAGNOSIS — N183 Chronic kidney disease, stage 3 unspecified: Secondary | ICD-10-CM | POA: Diagnosis present

## 2020-05-03 DIAGNOSIS — F0390 Unspecified dementia without behavioral disturbance: Secondary | ICD-10-CM | POA: Diagnosis present

## 2020-05-03 DIAGNOSIS — N1832 Chronic kidney disease, stage 3b: Secondary | ICD-10-CM | POA: Diagnosis not present

## 2020-05-03 DIAGNOSIS — E1122 Type 2 diabetes mellitus with diabetic chronic kidney disease: Secondary | ICD-10-CM | POA: Diagnosis present

## 2020-05-03 DIAGNOSIS — Z808 Family history of malignant neoplasm of other organs or systems: Secondary | ICD-10-CM

## 2020-05-03 DIAGNOSIS — R001 Bradycardia, unspecified: Secondary | ICD-10-CM | POA: Diagnosis present

## 2020-05-03 DIAGNOSIS — Z7982 Long term (current) use of aspirin: Secondary | ICD-10-CM | POA: Diagnosis not present

## 2020-05-03 DIAGNOSIS — K922 Gastrointestinal hemorrhage, unspecified: Secondary | ICD-10-CM | POA: Diagnosis present

## 2020-05-03 DIAGNOSIS — Z96652 Presence of left artificial knee joint: Secondary | ICD-10-CM | POA: Diagnosis present

## 2020-05-03 DIAGNOSIS — K5909 Other constipation: Secondary | ICD-10-CM | POA: Diagnosis present

## 2020-05-03 DIAGNOSIS — K648 Other hemorrhoids: Secondary | ICD-10-CM | POA: Diagnosis present

## 2020-05-03 DIAGNOSIS — D696 Thrombocytopenia, unspecified: Secondary | ICD-10-CM | POA: Diagnosis present

## 2020-05-03 DIAGNOSIS — K921 Melena: Principal | ICD-10-CM | POA: Diagnosis present

## 2020-05-03 DIAGNOSIS — C9110 Chronic lymphocytic leukemia of B-cell type not having achieved remission: Secondary | ICD-10-CM | POA: Diagnosis present

## 2020-05-03 DIAGNOSIS — Z79890 Hormone replacement therapy (postmenopausal): Secondary | ICD-10-CM | POA: Diagnosis not present

## 2020-05-03 DIAGNOSIS — Z79899 Other long term (current) drug therapy: Secondary | ICD-10-CM | POA: Diagnosis not present

## 2020-05-03 DIAGNOSIS — Z8546 Personal history of malignant neoplasm of prostate: Secondary | ICD-10-CM

## 2020-05-03 DIAGNOSIS — Z85828 Personal history of other malignant neoplasm of skin: Secondary | ICD-10-CM

## 2020-05-03 DIAGNOSIS — D472 Monoclonal gammopathy: Secondary | ICD-10-CM | POA: Diagnosis present

## 2020-05-03 DIAGNOSIS — E039 Hypothyroidism, unspecified: Secondary | ICD-10-CM | POA: Diagnosis present

## 2020-05-03 DIAGNOSIS — K649 Unspecified hemorrhoids: Secondary | ICD-10-CM | POA: Diagnosis not present

## 2020-05-03 DIAGNOSIS — Z9221 Personal history of antineoplastic chemotherapy: Secondary | ICD-10-CM | POA: Diagnosis not present

## 2020-05-03 DIAGNOSIS — K219 Gastro-esophageal reflux disease without esophagitis: Secondary | ICD-10-CM | POA: Diagnosis present

## 2020-05-03 DIAGNOSIS — D6959 Other secondary thrombocytopenia: Secondary | ICD-10-CM | POA: Diagnosis present

## 2020-05-03 DIAGNOSIS — N1831 Chronic kidney disease, stage 3a: Secondary | ICD-10-CM | POA: Diagnosis present

## 2020-05-03 DIAGNOSIS — Z20822 Contact with and (suspected) exposure to covid-19: Secondary | ICD-10-CM | POA: Diagnosis present

## 2020-05-03 DIAGNOSIS — K449 Diaphragmatic hernia without obstruction or gangrene: Secondary | ICD-10-CM | POA: Diagnosis present

## 2020-05-03 DIAGNOSIS — I10 Essential (primary) hypertension: Secondary | ICD-10-CM | POA: Diagnosis present

## 2020-05-03 DIAGNOSIS — I129 Hypertensive chronic kidney disease with stage 1 through stage 4 chronic kidney disease, or unspecified chronic kidney disease: Secondary | ICD-10-CM | POA: Diagnosis present

## 2020-05-03 LAB — COMPREHENSIVE METABOLIC PANEL
ALT: 20 U/L (ref 0–44)
AST: 23 U/L (ref 15–41)
Albumin: 3.6 g/dL (ref 3.5–5.0)
Alkaline Phosphatase: 68 U/L (ref 38–126)
Anion gap: 6 (ref 5–15)
BUN: 21 mg/dL (ref 8–23)
CO2: 29 mmol/L (ref 22–32)
Calcium: 9.2 mg/dL (ref 8.9–10.3)
Chloride: 103 mmol/L (ref 98–111)
Creatinine, Ser: 1.47 mg/dL — ABNORMAL HIGH (ref 0.61–1.24)
GFR, Estimated: 46 mL/min — ABNORMAL LOW (ref >60)
Glucose, Bld: 107 mg/dL — ABNORMAL HIGH (ref 70–99)
Potassium: 4.3 mmol/L (ref 3.5–5.1)
Sodium: 138 mmol/L (ref 135–145)
Total Bilirubin: 0.6 mg/dL (ref 0.3–1.2)
Total Protein: 6.8 g/dL (ref 6.5–8.1)

## 2020-05-03 LAB — CBC
HCT: 41 % (ref 39.0–52.0)
Hemoglobin: 13.7 g/dL (ref 13.0–17.0)
MCH: 34.3 pg — ABNORMAL HIGH (ref 26.0–34.0)
MCHC: 33.4 g/dL (ref 30.0–36.0)
MCV: 102.8 fL — ABNORMAL HIGH (ref 80.0–100.0)
Platelets: 131 10*3/uL — ABNORMAL LOW (ref 150–400)
RBC: 3.99 MIL/uL — ABNORMAL LOW (ref 4.22–5.81)
RDW: 13.4 % (ref 11.5–15.5)
WBC: 8 10*3/uL (ref 4.0–10.5)
nRBC: 0 % (ref 0.0–0.2)

## 2020-05-03 LAB — TYPE AND SCREEN
ABO/RH(D): A POS
Antibody Screen: NEGATIVE

## 2020-05-03 LAB — CBC WITH DIFFERENTIAL/PLATELET
Abs Immature Granulocytes: 0.03 10*3/uL (ref 0.00–0.07)
Basophils Absolute: 0.1 10*3/uL (ref 0.0–0.1)
Basophils Relative: 1 %
Eosinophils Absolute: 0.5 10*3/uL (ref 0.0–0.5)
Eosinophils Relative: 7 %
HCT: 39.4 % (ref 39.0–52.0)
Hemoglobin: 13.3 g/dL (ref 13.0–17.0)
Immature Granulocytes: 0 %
Lymphocytes Relative: 45 %
Lymphs Abs: 3.4 10*3/uL (ref 0.7–4.0)
MCH: 34.2 pg — ABNORMAL HIGH (ref 26.0–34.0)
MCHC: 33.8 g/dL (ref 30.0–36.0)
MCV: 101.3 fL — ABNORMAL HIGH (ref 80.0–100.0)
Monocytes Absolute: 1.2 10*3/uL — ABNORMAL HIGH (ref 0.1–1.0)
Monocytes Relative: 16 %
Neutro Abs: 2.3 10*3/uL (ref 1.7–7.7)
Neutrophils Relative %: 31 %
Platelets: 120 10*3/uL — ABNORMAL LOW (ref 150–400)
RBC: 3.89 MIL/uL — ABNORMAL LOW (ref 4.22–5.81)
RDW: 13.5 % (ref 11.5–15.5)
Smear Review: NORMAL
WBC: 7.5 10*3/uL (ref 4.0–10.5)
nRBC: 0 % (ref 0.0–0.2)

## 2020-05-03 LAB — LACTIC ACID, PLASMA
Lactic Acid, Venous: 1.5 mmol/L (ref 0.5–1.9)
Lactic Acid, Venous: 1 mmol/L (ref 0.5–1.9)

## 2020-05-03 LAB — PROTIME-INR
INR: 1 (ref 0.8–1.2)
Prothrombin Time: 13.1 seconds (ref 11.4–15.2)

## 2020-05-03 LAB — CBG MONITORING, ED
Glucose-Capillary: 86 mg/dL (ref 70–99)
Glucose-Capillary: 89 mg/dL (ref 70–99)

## 2020-05-03 MED ORDER — IOHEXOL 350 MG/ML SOLN
100.0000 mL | Freq: Once | INTRAVENOUS | Status: AC | PRN
  Administered 2020-05-03: 100 mL via INTRAVENOUS

## 2020-05-03 MED ORDER — LEVOTHYROXINE SODIUM 25 MCG PO TABS
25.0000 ug | ORAL_TABLET | Freq: Every day | ORAL | Status: DC
  Administered 2020-05-04 – 2020-05-05 (×2): 25 ug via ORAL
  Filled 2020-05-03 (×2): qty 1

## 2020-05-03 MED ORDER — PANTOPRAZOLE SODIUM 40 MG IV SOLR: 40.0000 mg | Freq: Every day | INTRAVENOUS | Status: DC

## 2020-05-03 MED ORDER — DONEPEZIL HCL 5 MG PO TABS
5.0000 mg | ORAL_TABLET | Freq: Every day | ORAL | Status: DC
  Administered 2020-05-03 – 2020-05-04 (×2): 5 mg via ORAL
  Filled 2020-05-03 (×2): qty 1

## 2020-05-03 MED ORDER — LATANOPROST 0.005 % OP SOLN
1.0000 [drp] | Freq: Every day | OPHTHALMIC | Status: DC
  Administered 2020-05-03: 1 [drp] via OPHTHALMIC
  Filled 2020-05-03 (×2): qty 2.5

## 2020-05-03 MED ORDER — SODIUM CHLORIDE 0.9 % IV SOLN
75.0000 mL/h | INTRAVENOUS | Status: AC
  Administered 2020-05-03: 75 mL/h via INTRAVENOUS

## 2020-05-03 MED ORDER — PANTOPRAZOLE SODIUM 40 MG IV SOLR
40.0000 mg | Freq: Two times a day (BID) | INTRAVENOUS | Status: DC
  Administered 2020-05-03 – 2020-05-05 (×4): 40 mg via INTRAVENOUS
  Filled 2020-05-03 (×4): qty 40

## 2020-05-03 MED ORDER — INSULIN ASPART 100 UNIT/ML ~~LOC~~ SOLN
0.0000 [IU] | SUBCUTANEOUS | Status: DC
  Administered 2020-05-04: 2 [IU] via SUBCUTANEOUS
  Administered 2020-05-04: 1 [IU] via SUBCUTANEOUS
  Filled 2020-05-03 (×2): qty 1

## 2020-05-03 MED ORDER — GABAPENTIN 100 MG PO CAPS
100.0000 mg | ORAL_CAPSULE | Freq: Three times a day (TID) | ORAL | Status: DC
  Administered 2020-05-03 – 2020-05-05 (×5): 100 mg via ORAL
  Filled 2020-05-03 (×5): qty 1

## 2020-05-03 MED ORDER — SODIUM CHLORIDE 0.9 % IV BOLUS
1000.0000 mL | Freq: Once | INTRAVENOUS | Status: AC
  Administered 2020-05-03: 1000 mL via INTRAVENOUS

## 2020-05-03 NOTE — ED Provider Notes (Signed)
[facility] Emergency Department Provider Note  ____________________________________________   Event Date/Time   First MD Initiated Contact with Patient 05/03/20 [phone]     (approximate)  I have reviewed the triage vital signs and the nursing notes.   HISTORY  Chief Complaint Rectal Bleeding    HPI Allen Chang is a 87 y.o. male with past medical history of hypertension, hyperlipidemia, diverticulosis, here with GI bleed.  The patient states that starting last night, he began to have a "rumbling" in his abdomen.  He had some lower abdominal pain that was aching and gnawing.  He states he went to the bathroom today, and felt the need to have a large bowel movement.  He then had what he describes as a very large, grossly bloody bowel movement.  It was covered with bright red as well as maroon-colored stool.  He had blood clots in it.  He has never had a GI bleed in the past.  Denies any blood thinner use.  Denies any fevers or chills.  No weight loss.  No recent medication changes.  He does have a history of leukemia but denies history of low platelets or needing transfusions in the past.  No other complaints.        Past Medical History:  Diagnosis Date  . Cancer [facility])    prostate   . CKD (chronic kidney disease) stage 3, GFR 30-59 ml/min (HCC) 10/01/2016  . Complication of anesthesia    bradycardia, in ICU for 24 hour after galbladder surgery.   . Diverticulosis 2012  . Dysrhythmia    Heart skips a beat  . Elevated lipids   . GERD (gastroesophageal reflux disease)   . History of hiatal hernia   . Hypertension   . Hypothyroidism   . Leukemia ([place])   . Prostate cancer ([place])   . Skin cancer    face, scalp, behind ear,back and hand  . Tremors of nervous system     Patient Active Problem List   Diagnosis Date Noted  . Lower GI bleed 05/03/2020  . Hiatal hernia 12/16/2019  . Postural dizziness with presyncope 12/16/2019  . New onset Atrial  fibrillation with slow ventricular response ([place]) 12/16/2019  . Chest pain 12/16/2019  . Encounter for antineoplastic chemotherapy 10/22/2018  . Goals of care, counseling/discussion 09/28/2018  . Thrombocytopenia ([place]) 10/27/2016  . Iron deficiency 10/27/2016  . CLL (chronic lymphocytic leukemia) ([place]) 10/27/2016  . Macrocytic anemia 10/27/2016  . Acquired hypothyroidism 10/01/2016  . Chronic kidney disease (CKD) stage G3a/A1, moderately decreased glomerular filtration rate (GFR) between 45-59 mL/min/1.73 square meter and albuminuria creatinine ratio less than 30 mg/g (HCC) 10/01/2016  . Status post left partial knee replacement 12/12/2014  . Bradycardia 10/10/2014  . Severe sinus bradycardia 10/10/2014  . Benign essential hypertension 05/02/2013  . Diabetes mellitus ([place]) 05/02/2013  . Hyperlipidemia, unspecified 05/02/2013    Past Surgical History:  Procedure Laterality Date  . BLADDER TUMOR EXCISION    . CARDIAC CATHETERIZATION    . CHOLECYSTECTOMY N/A 10/10/2014   Procedure: LAPAROSCOPIC CHOLECYSTECTOMY WITH INTRAOPERATIVE CHOLANGIOGRAM;  Surgeon: Leonie Green, MD;  Location: [facility] ORS;  Service: General;  Laterality: N/A;  . COLONOSCOPY WITH PROPOFOL N/A 02/16/2017   Procedure: COLONOSCOPY WITH PROPOFOL;  Surgeon: Manya Silvas, MD;  Location: [facility] ENDOSCOPY;  Service: Endoscopy;  Laterality: N/A;  . ESOPHAGOGASTRODUODENOSCOPY (EGD) WITH PROPOFOL N/A 02/16/2017   Procedure: ESOPHAGOGASTRODUODENOSCOPY (EGD) WITH PROPOFOL;  Surgeon: Manya Silvas, MD;  Location: [facility] ENDOSCOPY;  Service:  Endoscopy;  Laterality: N/A;  . HEMORRHOID SURGERY    . HERNIA REPAIR Left    x2  . JOINT REPLACEMENT Left    Partial Knee Replacement, Dr. Roland Rack  . PARTIAL KNEE ARTHROPLASTY Left 12/12/2014   Procedure: UNICOMPARTMENTAL KNEE;  Surgeon: Corky Mull, MD;  Location: [facility] ORS;  Service: Orthopedics;  Laterality: Left;  . prostate seeding    . SHOULDER ARTHROSCOPY Right   . SHOULDER  ARTHROSCOPY WITH OPEN ROTATOR CUFF REPAIR Left 02/07/2016   Procedure: SHOULDER ARTHROSCOPY WITH OPEN ROTATOR CUFF REPAIR;  Surgeon: Corky Mull, MD;  Location: [facility] ORS;  Service: Orthopedics;  Laterality: Left;    Prior to Admission medications   Medication Sig Start Date End Date Taking? Authorizing Provider  propranolol (INDERAL) 10 MG tablet Take 10 mg by mouth 2 (two) times daily.   Yes [provider]  acetaminophen (TYLENOL) 500 MG tablet Take 1,000 mg by mouth every 8 (eight) hours as needed for moderate pain.    [provider]  ascorbic acid (VITAMIN C) 1000 MG tablet Take 1,000 mg by mouth daily.    [provider]  aspirin 81 MG tablet Take 81 mg by mouth daily. In am    [provider]  b complex vitamins capsule Take 1 capsule by mouth daily.    [provider]  benzonatate (TESSALON) 200 MG capsule Take 1 capsule (200 mg total) by mouth 3 (three) times daily as needed for cough. Patient not taking: Reported on 04/16/2020 02/19/20   Coral Spikes, DO  calcium carbonate (OSCAL) 1500 (600 Ca) MG TABS tablet Take by mouth daily with breakfast.    [provider]  Cholecalciferol 25 MCG (1000 UT) tablet Take by mouth. Patient not taking: Reported on 04/16/2020    [provider]  DOCOSAHEXAENOIC ACID-EPA PO Take by mouth. 120-80 mg cap    [provider]  donepezil (ARICEPT) 5 MG tablet Take by mouth. 01/24/20 01/23/21  [provider]  doxycycline (VIBRAMYCIN) 100 MG capsule Take 1 capsule (100 mg total) by mouth 2 (two) times daily. Patient not taking: Reported on 04/16/2020 02/19/20   Coral Spikes, DO  DULoxetine (CYMBALTA) 20 MG capsule Take 20 mg by mouth daily. 06/08/19   [provider]  famotidine (PEPCID) 40 MG tablet Take 40 mg by mouth 2 (two) times daily. 04/28/19   [provider]  ferrous sulfate 324 MG TBEC Take 324 mg by mouth 3 (three) times daily.     [provider]   Flaxseed, Linseed, (FLAX SEED OIL) 1000 MG CAPS Take 1 capsule by mouth daily.    [provider]  gabapentin (NEURONTIN) 100 MG capsule Take 100 mg by mouth 3 (three) times daily.  02/15/18 04/16/20  [provider]  Garlic 3790 MG CAPS Take 1 capsule by mouth every morning.    [provider]  latanoprost (XALATAN) 0.005 % ophthalmic solution Place 1 drop into both eyes at bedtime.    [provider]  levothyroxine (SYNTHROID, LEVOTHROID) 25 MCG tablet Take 25 mcg by mouth daily at 12 noon. 04/02/16   [provider]  loratadine (CLARITIN REDITABS) 10 MG dissolvable tablet Take 10 mg by mouth daily. In am    [provider]  Misc Natural Products (BLACK CHERRY CONCENTRATE) LIQD Take 15 mLs by mouth daily.     [provider]  MISC NATURAL PRODUCTS PO Take by mouth. Beet juice capsules    [provider]  Multiple  Vitamins-Minerals (MULTIVITAMIN WITH MINERALS) tablet Take 1 tablet by mouth daily. In am    [provider]  Omega-3 Fatty Acids (FISH OIL) 1000 MG CAPS Take 1 capsule by mouth daily.    [provider]  primidone (MYSOLINE) 50 MG tablet Take 1 tablet by mouth 2 (two) times daily. 02/21/20 04/05/21  [provider]  ZINC GLUCONATE PO Take by mouth.    [provider]    Allergies Oxycodone-acetaminophen, Percocet [oxycodone-acetaminophen], and Voltaren [diclofenac sodium]  Family History  Problem Relation Age of Onset  . Bone cancer Father   . Hypertension Mother   . Osteoporosis Mother   . Breast cancer Sister   . Cancer Brother   . Cancer Brother   . Lung cancer Brother   . Bladder Cancer Brother   . Melanoma Brother     Social History Social History   Tobacco Use  . Smoking status: Never Smoker  . Smokeless tobacco: Never Used  Vaping Use  . Vaping Use: Never used  Substance Use Topics  . Alcohol use: No  . Drug use: No    Review of Systems  Review of  Systems  Constitutional: Positive for fatigue. Negative for chills and fever.  HENT: Negative for sore throat.   Respiratory: Negative for shortness of breath.   Cardiovascular: Negative for chest pain.  Gastrointestinal: Positive for abdominal pain and blood in stool.  Genitourinary: Negative for flank pain.  Musculoskeletal: Negative for neck pain.  Skin: Negative for rash and wound.  Allergic/Immunologic: Negative for immunocompromised state.  Neurological: Positive for weakness. Negative for numbness.  Hematological: Does not bruise/bleed easily.  All other systems reviewed and are negative.    ____________________________________________  PHYSICAL EXAM:      VITAL SIGNS: ED Triage Vitals [05/03/20 1547]  Enc Vitals Group     BP 138/65     Pulse Rate (!) 58     Resp 17     Temp 98.5 F (36.9 C)     Temp src      SpO2 100 %     Weight 172 lb (78 kg)     Height 5\' 9"  (1.753 m)     Head Circumference      Peak Flow      Pain Score 0     Pain Loc      Pain Edu?      Excl. in [place]?      Physical Exam Vitals and nursing note reviewed.  Constitutional:      General: He is not in acute distress.    Appearance: He is well-developed.  HENT:     Head: Normocephalic and atraumatic.  Eyes:     Conjunctiva/sclera: Conjunctivae normal.  Cardiovascular:     Rate and Rhythm: Normal rate and regular rhythm.     Heart sounds: Normal heart sounds.  Pulmonary:     Effort: Pulmonary effort is normal. No respiratory distress.     Breath sounds: No wheezing.  Abdominal:     General: There is no distension.     Tenderness: There is abdominal tenderness (mild, bilateral lower abdomen).  Musculoskeletal:     Cervical back: Neck supple.  Skin:    General: Skin is warm.     Capillary Refill: Capillary refill takes less than 2 seconds.     Findings: No rash.  Neurological:     Mental Status: He is alert and oriented to person, place, and time.     Motor: No abnormal muscle  tone.        ____________________________________________   LABS (all labs ordered are listed, but only abnormal results are displayed)  Labs Reviewed  COMPREHENSIVE METABOLIC PANEL - Abnormal; Notable for the following components:      Result Value   Glucose, Bld 107 (*)    Creatinine, Ser 1.47 (*)    GFR, Estimated 46 (*)    All other components within normal limits  CBC - Abnormal; Notable for the following components:   RBC 3.99 (*)    MCV 102.8 (*)    MCH 34.3 (*)    Platelets 131 (*)    All other components within normal limits  PROTIME-INR  LACTIC ACID, PLASMA  LACTIC ACID, PLASMA  CBC  CBC  HEMOGLOBIN A1C  CBC WITH DIFFERENTIAL/PLATELET  VITAMIN B12  FOLATE  POC OCCULT BLOOD, ED  TYPE AND SCREEN  TYPE AND SCREEN    ____________________________________________  EKG:  ________________________________________  RADIOLOGY All imaging, including plain films, CT scans, and ultrasounds, independently reviewed by me, and interpretations confirmed via formal radiology reads.  ED MD interpretation:   CT Angio: No acute abnormality, diverticulosis  Official radiology report(s): CT Angio Abd/Pel W and/or Wo Contrast  Result Date: 05/03/2020 CLINICAL DATA:  GI bleed.  Abdomen pelvis CT 02/25/2018 EXAM: CTA ABDOMEN AND PELVIS WITHOUT AND WITH CONTRAST TECHNIQUE: Multidetector CT imaging of the abdomen and pelvis was performed using the standard protocol during bolus administration of intravenous contrast. Multiplanar reconstructed images and MIPs were obtained and reviewed to evaluate the vascular anatomy. CONTRAST:  155mL OMNIPAQUE IOHEXOL 350 MG/ML SOLN COMPARISON:  None. FINDINGS: VASCULAR Aorta: Normal caliber aorta without aneurysm, dissection, vasculitis or significant stenosis. Moderate atherosclerosis. Celiac: Patent without evidence of aneurysm, dissection, vasculitis or significant stenosis. SMA: Patent without evidence of aneurysm, dissection, vasculitis or significant  stenosis. Renals: Single bilateral renal arteries. Both renal arteries are patent. There is plaque at the origin of the left renal artery causing approximately 50% stenosis. No dissection, aneurysm, or vasculitis. IMA: Patent. Inflow: Patent without evidence of aneurysm, dissection, vasculitis or significant stenosis. Proximal Outflow: Bilateral common femoral and visualized portions of the superficial and profunda femoral arteries are patent without evidence of aneurysm, dissection, vasculitis or significant stenosis. Veins: Venous phase imaging demonstrates patent hepatic and portal veins. Mesenteric veins are patent. No portal venous or mesenteric gas. Iliac veins and IVC are unremarkable. Review of the MIP images confirms the above findings. NON-VASCULAR Lower chest: Large hiatal hernia. Adjacent compressive atelectasis in both lower lobes. There is also linear atelectasis or scarring in both dependent lower lobes. No pleural fluid. Heart size normal. Hepatobiliary: Tiny hypodensity in the right lobe of the liver, series 7, image 16, too small to characterize. Cholecystectomy without biliary dilatation. Pancreas: Fatty atrophy.  No ductal dilatation or inflammation. Spleen: Normal in size without focal abnormality. Adrenals/Urinary Tract: Normal adrenal glands. No hydronephrosis. Mild symmetric perinephric edema, typically chronic. Right renal cyst measures 15 mm, additional low-density bilateral renal cortical lesions are too small to characterize. Unremarkable urinary bladder. Stomach/Bowel: There is no contrast accumulation within the GI tract to localize site of GI bleed. There is high-density barium within scattered colonic diverticula that is also present on precontrast exam. Moderate diffuse colonic diverticulosis without diverticulitis or acute colonic inflammation. Colonic tortuosity with moderate volume of stool. There is fecalization of small bowel contents. No small bowel obstruction or inflammation.  Large hiatal hernia with the majority of the stomach being intrathoracic. Lymphatic: No abdominopelvic adenopathy. Reproductive: Brachytherapy seeds in  the prostate gland. Other: Moderate-sized fat containing right inguinal hernia. Prior left inguinal hernia repair. There is residual fat in the left inguinal canal. No intra-abdominal free air or free fluid. No abscess. Musculoskeletal: There are no acute or suspicious osseous abnormalities. Degenerative change of both hips. Degenerative change in the spine with primarily facet hypertrophy. IMPRESSION: 1. No contrast accumulation within the GI tract to localize site of GI bleed. No evidence of bowel inflammation. 2. Moderate diffuse colonic diverticulosis without diverticulitis. 3. Large hiatal hernia with the majority of the stomach being intrathoracic. 4. Moderate fat containing right inguinal hernia. Prior left inguinal hernia repair. Aortic Atherosclerosis (ICD10-I70.0). Electronically Signed   By: Keith Rake M.D.   On: 05/03/2020 18:17    ____________________________________________  PROCEDURES   Procedure(s) performed (including Critical Care):  .1-3 Lead EKG Interpretation Performed by: Duffy Bruce, MD Authorized by: Duffy Bruce, MD     Interpretation: normal     ECG rate:  50-70   ECG rate assessment: normal     Rhythm: sinus rhythm     Ectopy: none     Conduction: normal   Comments:     Indication: GI Bleed    ____________________________________________  INITIAL IMPRESSION / MDM / ASSESSMENT AND PLAN / ED COURSE  As part of my medical decision making, I reviewed the following data within the [facility] notes reviewed and incorporated, Old chart reviewed, Notes from prior ED visits, and [place] Controlled Substance Database       *Rakan A Haden was evaluated in Emergency Department on 05/03/2020 for the symptoms described in the history of present illness. He was evaluated in the context of  the global COVID-19 pandemic, which necessitated consideration that the patient might be at risk for infection with the SARS-CoV-2 virus that causes COVID-19. Institutional protocols and algorithms that pertain to the evaluation of patients at risk for COVID-19 are in a state of rapid change based on information released by regulatory bodies including the CDC and federal and state organizations. These policies and algorithms were followed during the patient's care in the ED.  Some ED evaluations and interventions may be delayed as a result of limited staffing during the pandemic.*     Medical Decision Making: 87 year old male here with GI bleeding.  Suspect lower GI bleed secondary to diverticulosis.  CBC shows no leukocytosis.  Hemoglobin is 13.7.  Platelets 131.  He is not on anticoagulation.  CMP largely unremarkable.  Will obtain CT angio as I am unable obtain a GI bleeding scan at this time after hours.  Dr. Enriqueta Shutter of GI was consulted and will see the patient as a consult.  No signs of upper GI bleed or indication for PPI at this time.  Patient updated and in agreement.  No significant abdominal pain and lactic acid is normal, do not suspect ischemia.  CT angio w/o active bleeding. Pt remains HDS.  DRE shows grossly bloody stool, hemoccult positive on testing. Will admit to medicine.  ____________________________________________  FINAL CLINICAL IMPRESSION(S) / ED DIAGNOSES  Final diagnoses:  Lower GI bleed  Acute GI bleeding     MEDICATIONS GIVEN DURING THIS VISIT:  Medications  insulin aspart (novoLOG) injection 0-9 Units (has no administration in time range)  sodium chloride 0.9 % bolus 1,000 mL (1,000 mLs Intravenous New Bag/Given 05/03/20 1706)  iohexol (OMNIPAQUE) 350 MG/ML injection 100 mL (100 mLs Intravenous Contrast Given 05/03/20 1737)     ED Discharge Orders    None  Note:  This document was prepared using Dragon voice recognition software and may include  unintentional dictation errors.   Duffy Bruce, MD 05/03/20 [phone]

## 2020-05-03 NOTE — ED Triage Notes (Signed)
Pt states coming in after having a "large, bloody bowel movements." Pt states abdominal pain for years and a history of diverticulitis. Wife states she called the GI office and was told come to the ER. Pt states a sore stomach, but no pain.

## 2020-05-03 NOTE — H&P (Signed)
Allen Chang YBO:[id number] DOB: [birth year] DOA: 05/03/2020     PCP: Tracie Harrier, MD   Outpatient Specialists:      Oncology Dr. Tasia Catchings GI Ok Edwards, NP [facility]    Patient arrived to ER on 05/03/20 at 1515 Referred by Attending Duffy Bruce, MD   Patient coming from: home Lives  With family    Chief Complaint:   Chief Complaint  Patient presents with  . Rectal Bleeding    HPI: Allen Chang is a 87 y.o. male with medical history significant of MGUS, CLL, diverticultis  arthritis, CKD stage 3, DM diet controlled  Presented with  maroone BM today, he had a little dizzy episode after that. Patient flushed the toilet before his wife was able to see the but does report there was some blood mixed with stool and on the surface of the stool as well. 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  Patient has dementia for which she takes Aricept no longer able to drive Infectious risk factors:  Reports abdominal pain,     Has been vaccinated against COVID  and boosted   Initial COVID TEST   in house  PCR testing  Pending  Lab Results  Component Value Date   [place] 02/19/2020   [place] NEGATIVE 12/16/2019     Regarding pertinent Chronic problems:   CLL - was on P ochemo not on any meds anymore had a bad response will reasses in May  HTN on propranolol   Hypothyroidism: No results found for: TSH on synthroid     CKD stage IIIb- baseline Cr 1.4 Estimated Creatinine Clearance: 36.7 mL/min (A) (by C-G formula based on SCr of 1.47 mg/dL (H)).  Lab Results  Component Value Date   CREATININE 1.47 (H) 05/03/2020   CREATININE 1.38 (H) 02/14/2020   CREATININE 1.48 (H) 12/15/2019     While in ER: Hemoglobin stable at 13, plt 131, INR 1.1, no reccurent  bleeding Hemoccult positive maroon stool on exam CAT scan showing no acute bleeding ED Triage Vitals [05/03/20 1547]  Enc Vitals Group     BP 138/65     Pulse Rate (!) 58     Resp 17     Temp 98.5 F (36.9 C)     Temp src      SpO2 100 %     Weight 172 lb (78 kg)     Height 5\' 9"  (1.753 m)     Head Circumference      Peak Flow      Pain Score 0     Pain Loc      Pain Edu?      Excl. in [place]?   IDPO(24)@     _________________________________________ Significant initial  Findings: Abnormal Labs Reviewed  COMPREHENSIVE METABOLIC PANEL - Abnormal; Notable for the following components:      Result Value   Glucose, Bld 107 (*)    Creatinine, Ser 1.47 (*)    GFR, Estimated 46 (*)    All other components within normal limits  CBC - Abnormal; Notable for the following components:   RBC 3.99 (*)    MCV 102.8 (*)    MCH 34.3 (*)    Platelets 131 (*)    All other  components within normal limits   ____________________________________________      CTabd/pelvis -  Non-acute or evidence of moderate diffuse colonic diverticulosis no diverticulitis right inguinal hernia stable   ECG:   Ordered     The recent clinical data is shown below. Vitals:   05/03/20 1547 05/03/20 1630 05/03/20 1700 05/03/20 1719  BP: 138/65 140/77 136/60 136/60  Pulse: (!) 58   (!) 58  Resp: 17 17 15 15   Temp: 98.5 F (36.9 C)     SpO2: 100%   100%  Weight: 78 kg     Height: 5\' 9"  (1.753 m)        WBC     Component Value Date/Time   WBC 8.0 05/03/2020 1630    Lactic Acid, Venous    Component Value Date/Time   LATICACIDVEN 1.5 05/03/2020 1630      UA not ordered    _______________________________________________________ ER Provider Called:  GI    Dr. Enriqueta Shutter  They Recommend admit to medicine  Will see in AM    ___________________ Hospitalist was called for admission for lower GI bleed  The following Work up has been ordered so far:  Orders Placed This Encounter  Procedures  . CT Angio  Abd/Pel W and/or Wo Contrast  . Comprehensive metabolic panel  . CBC  . Protime-INR  . Lactic acid, plasma  . Diet NPO time specified  . Place X2 Large Bore IV's  . Initiate Carrier Fluid Protocol  . Consult to hospitalist  . POC occult blood, ED  . Type and screen [place]  . Type and screen    Following Medications were ordered in ER: Medications  sodium chloride 0.9 % bolus 1,000 mL (1,000 mLs Intravenous New Bag/Given 05/03/20 1706)  iohexol (OMNIPAQUE) 350 MG/ML injection 100 mL (100 mLs Intravenous Contrast Given 05/03/20 1737)        Consult Orders  (From admission, onward)         Start     Ordered   05/03/20 1831  Consult to hospitalist  Once       Provider:  (Not yet assigned)  Question Answer Comment  Place call to: Hospitalist   Reason for Consult Admit      05/03/20 1830            OTHER Significant initial  Findings:  labs showing:    Recent Labs  Lab 05/03/20 1630  NA 138  K 4.3  CO2 29  GLUCOSE 107*  BUN 21  CREATININE 1.47*  CALCIUM 9.2    Cr  stable,   Lab Results  Component Value Date   CREATININE 1.47 (H) 05/03/2020   CREATININE 1.38 (H) 02/14/2020   CREATININE 1.48 (H) 12/15/2019    Recent Labs  Lab 05/03/20 1630  AST 23  ALT 20  ALKPHOS 68  BILITOT 0.6  PROT 6.8  ALBUMIN 3.6   Lab Results  Component Value Date   CALCIUM 9.2 05/03/2020          Plt: Lab Results  Component Value Date   PLT 131 (L) 05/03/2020      COVID-19 Labs  No results for input(s): DDIMER, FERRITIN, LDH, CRP in the last 72 hours.  Lab Results  Component Value Date   SARSCOV2NAA NEGATIVE 02/19/2020   [place] NEGATIVE 12/16/2019      Recent Labs  Lab 05/03/20 1630  WBC 8.0  HGB 13.7  HCT 41.0  MCV 102.8*  PLT 131*    HG/HCT stable,  Component Value Date/Time   HGB 13.7 05/03/2020 1630   HGB 11.9 (L) 06/01/2014 0342   HCT 41.0 05/03/2020 1630   HCT 36.1 (L) 06/01/2014 0342   MCV 102.8 (H)  05/03/2020 1630   MCV 101 (H) 06/01/2014 0342       DM  labs:  HbA1C: Recent Labs    12/15/19 2006  HGBA1C 5.5          Cultures:    Component Value Date/Time   SDES BLOOD RIGHT HAND 03/31/2017 1920   SDES BLOOD RAC 03/31/2017 1920   SPECREQUEST  03/31/2017 1920    BOTTLES DRAWN AEROBIC AND ANAEROBIC Blood Culture adequate volume   SPECREQUEST  03/31/2017 1920    BOTTLES DRAWN AEROBIC AND ANAEROBIC Blood Culture adequate volume   CULT  03/31/2017 1920    NO GROWTH 5 DAYS Performed at [facility], [address] [place], [place] [address]    CULT  03/31/2017 1920    NO GROWTH 5 DAYS Performed at [facility], [address], [place], [place] [address]    REPTSTATUS 04/05/2017 FINAL 03/31/2017 1920   REPTSTATUS 04/05/2017 FINAL 03/31/2017 1920     Radiological Exams on Admission: CT Angio Abd/Pel W and/or Wo Contrast  Result Date: 05/03/2020 CLINICAL DATA:  GI bleed.  Abdomen pelvis CT 02/25/2018 EXAM: CTA ABDOMEN AND PELVIS WITHOUT AND WITH CONTRAST TECHNIQUE: Multidetector CT imaging of the abdomen and pelvis was performed using the standard protocol during bolus administration of intravenous contrast. Multiplanar reconstructed images and MIPs were obtained and reviewed to evaluate the vascular anatomy. CONTRAST:  160mL OMNIPAQUE IOHEXOL 350 MG/ML SOLN COMPARISON:  None. FINDINGS: VASCULAR Aorta: Normal caliber aorta without aneurysm, dissection, vasculitis or significant stenosis. Moderate atherosclerosis. Celiac: Patent without evidence of aneurysm, dissection, vasculitis or significant stenosis. SMA: Patent without evidence of aneurysm, dissection, vasculitis or significant stenosis. Renals: Single bilateral renal arteries. Both renal arteries are patent. There is plaque at the origin of the left renal artery causing approximately 50% stenosis. No dissection, aneurysm, or vasculitis. IMA: Patent. Inflow: Patent without evidence of aneurysm, dissection,  vasculitis or significant stenosis. Proximal Outflow: Bilateral common femoral and visualized portions of the superficial and profunda femoral arteries are patent without evidence of aneurysm, dissection, vasculitis or significant stenosis. Veins: Venous phase imaging demonstrates patent hepatic and portal veins. Mesenteric veins are patent. No portal venous or mesenteric gas. Iliac veins and IVC are unremarkable. Review of the MIP images confirms the above findings. NON-VASCULAR Lower chest: Large hiatal hernia. Adjacent compressive atelectasis in both lower lobes. There is also linear atelectasis or scarring in both dependent lower lobes. No pleural fluid. Heart size normal. Hepatobiliary: Tiny hypodensity in the right lobe of the liver, series 7, image 16, too small to characterize. Cholecystectomy without biliary dilatation. Pancreas: Fatty atrophy.  No ductal dilatation or inflammation. Spleen: Normal in size without focal abnormality. Adrenals/Urinary Tract: Normal adrenal glands. No hydronephrosis. Mild symmetric perinephric edema, typically chronic. Right renal cyst measures 15 mm, additional low-density bilateral renal cortical lesions are too small to characterize. Unremarkable urinary bladder. Stomach/Bowel: There is no contrast accumulation within the GI tract to localize site of GI bleed. There is high-density barium within scattered colonic diverticula that is also present on precontrast exam. Moderate diffuse colonic diverticulosis without diverticulitis or acute colonic inflammation. Colonic tortuosity with moderate volume of stool. There is fecalization of small bowel contents. No small bowel obstruction or inflammation. Large hiatal hernia with the majority of the stomach being intrathoracic. Lymphatic: No  abdominopelvic adenopathy. Reproductive: Brachytherapy seeds in the prostate gland. Other: Moderate-sized fat containing right inguinal hernia. Prior left inguinal hernia repair. There is  residual fat in the left inguinal canal. No intra-abdominal free air or free fluid. No abscess. Musculoskeletal: There are no acute or suspicious osseous abnormalities. Degenerative change of both hips. Degenerative change in the spine with primarily facet hypertrophy. IMPRESSION: 1. No contrast accumulation within the GI tract to localize site of GI bleed. No evidence of bowel inflammation. 2. Moderate diffuse colonic diverticulosis without diverticulitis. 3. Large hiatal hernia with the majority of the stomach being intrathoracic. 4. Moderate fat containing right inguinal hernia. Prior left inguinal hernia repair. Aortic Atherosclerosis (ICD10-I70.0). Electronically Signed   By: Keith Rake M.D.   On: 05/03/2020 18:17   _______________________________________________________________________________________________________ Latest  Blood pressure 136/60, pulse (!) 58, temperature 98.5 F (36.9 C), resp. rate 15, height 5\' 9"  (1.753 m), weight 78 kg, SpO2 100 %.   Review of Systems:    Pertinent positives include: knee pain and  blood in stool,  Constitutional:  No weight loss, night sweats, Fevers, chills, fatigue, weight loss  HEENT:  No headaches, Difficulty swallowing,Tooth/dental problems,Sore throat,  No sneezing, itching, ear ache, nasal congestion, post nasal drip,  Cardio-vascular:  No chest pain, Orthopnea, PND, anasarca, dizziness, palpitations.no Bilateral lower extremity swelling  GI:  No heartburn, indigestion, abdominal pain, nausea, vomiting, diarrhea, change in bowel habits, loss of appetite, melena, hematemesis Resp:  no shortness of breath at rest. No dyspnea on exertion, No excess mucus, no productive cough, No non-productive cough, No coughing up of blood.No change in color of mucus.No wheezing. Skin:  no rash or lesions. No jaundice GU:  no dysuria, change in color of urine, no urgency or frequency. No straining to urinate.  No flank pain.  Musculoskeletal:  No  joint pain or no joint swelling. No decreased range of motion. No back pain.  Psych:  No change in mood or affect. No depression or anxiety. No memory loss.  Neuro: no localizing neurological complaints, no tingling, no weakness, no double vision, no gait abnormality, no slurred speech, no confusion  All systems reviewed and apart from [place] all are negative _______________________________________________________________________________________________ Past Medical History:   Past Medical History:  Diagnosis Date  . Cancer [facility])    prostate   . CKD (chronic kidney disease) stage 3, GFR 30-59 ml/min (HCC) 10/01/2016  . Complication of anesthesia    bradycardia, in ICU for 24 hour after galbladder surgery.   . Diverticulosis 2012  . Dysrhythmia    Heart skips a beat  . Elevated lipids   . GERD (gastroesophageal reflux disease)   . History of hiatal hernia   . Hypertension   . Hypothyroidism   . Leukemia ([place])   . Prostate cancer ([place])   . Skin cancer    face, scalp, behind ear,back and hand  . Tremors of nervous system       Past Surgical History:  Procedure Laterality Date  . BLADDER TUMOR EXCISION    . CARDIAC CATHETERIZATION    . CHOLECYSTECTOMY N/A 10/10/2014   Procedure: LAPAROSCOPIC CHOLECYSTECTOMY WITH INTRAOPERATIVE CHOLANGIOGRAM;  Surgeon: Leonie Green, MD;  Location: [facility] ORS;  Service: General;  Laterality: N/A;  . COLONOSCOPY WITH PROPOFOL N/A 02/16/2017   Procedure: COLONOSCOPY WITH PROPOFOL;  Surgeon: Manya Silvas, MD;  Location: [facility] ENDOSCOPY;  Service: Endoscopy;  Laterality: N/A;  . ESOPHAGOGASTRODUODENOSCOPY (EGD) WITH PROPOFOL N/A 02/16/2017   Procedure: ESOPHAGOGASTRODUODENOSCOPY (EGD) WITH PROPOFOL;  Surgeon: Vira Agar,  Gavin Pound, MD;  Location: [facility] ENDOSCOPY;  Service: Endoscopy;  Laterality: N/A;  . HEMORRHOID SURGERY    . HERNIA REPAIR Left    x2  . JOINT REPLACEMENT Left    Partial Knee Replacement, Dr. Roland Rack  . PARTIAL KNEE ARTHROPLASTY  Left 12/12/2014   Procedure: UNICOMPARTMENTAL KNEE;  Surgeon: Corky Mull, MD;  Location: [facility] ORS;  Service: Orthopedics;  Laterality: Left;  . prostate seeding    . SHOULDER ARTHROSCOPY Right   . SHOULDER ARTHROSCOPY WITH OPEN ROTATOR CUFF REPAIR Left 02/07/2016   Procedure: SHOULDER ARTHROSCOPY WITH OPEN ROTATOR CUFF REPAIR;  Surgeon: Corky Mull, MD;  Location: [facility] ORS;  Service: Orthopedics;  Laterality: Left;    Social History:  Ambulatory   cane,       reports that he has never smoked. He has never used smokeless tobacco. He reports that he does not drink alcohol and does not use drugs.   Family History:   Family History  Problem Relation Age of Onset  . Bone cancer Father   . Hypertension Mother   . Osteoporosis Mother   . Breast cancer Sister   . Cancer Brother   . Cancer Brother   . Lung cancer Brother   . Bladder Cancer Brother   . Melanoma Brother    ______________________________________________________________________________________________ Allergies: Allergies  Allergen Reactions  . Oxycodone-Acetaminophen Other (See Comments)    Other reaction(s): Hallucinations Other reaction(s): Other (See Comments) Hallucination Hallucination Hallucination  . Percocet [Oxycodone-Acetaminophen] Other (See Comments)    Hallucination   . Voltaren [Diclofenac Sodium] Palpitations     Prior to Admission medications   Medication Sig Start Date End Date Taking? Authorizing Provider  acetaminophen (TYLENOL) 500 MG tablet Take 1,000 mg by mouth every 8 (eight) hours as needed for moderate pain.    [provider]  ascorbic acid (VITAMIN C) 1000 MG tablet Take 1,000 mg by mouth daily.    [provider]  aspirin 81 MG tablet Take 81 mg by mouth daily. In am    [provider]  b complex vitamins capsule Take 1 capsule by mouth daily.    [provider]  benzonatate (TESSALON) 200 MG capsule Take 1 capsule (200 mg total) by mouth 3  (three) times daily as needed for cough. Patient not taking: Reported on 04/16/2020 02/19/20   Coral Spikes, DO  calcium carbonate (OSCAL) 1500 (600 Ca) MG TABS tablet Take by mouth daily with breakfast.    [provider]  Cholecalciferol 25 MCG (1000 UT) tablet Take by mouth. Patient not taking: Reported on 04/16/2020    [provider]  DOCOSAHEXAENOIC ACID-EPA PO Take by mouth. 120-80 mg cap    [provider]  donepezil (ARICEPT) 5 MG tablet Take by mouth. 01/24/20 01/23/21  [provider]  doxycycline (VIBRAMYCIN) 100 MG capsule Take 1 capsule (100 mg total) by mouth 2 (two) times daily. Patient not taking: Reported on 04/16/2020 02/19/20   Coral Spikes, DO  DULoxetine (CYMBALTA) 20 MG capsule Take 20 mg by mouth daily. 06/08/19   [provider]  famotidine (PEPCID) 40 MG tablet Take 40 mg by mouth 2 (two) times daily. 04/28/19   [provider]  ferrous sulfate 324 MG TBEC Take 324 mg by mouth 3 (three) times daily.     [provider]  Flaxseed, Linseed, (FLAX SEED OIL) 1000 MG CAPS Take 1 capsule by mouth daily.    [provider]  gabapentin (NEURONTIN) 100 MG capsule  Take 100 mg by mouth 3 (three) times daily.  02/15/18 04/16/20  [provider]  Garlic 8115 MG CAPS Take 1 capsule by mouth every morning.    [provider]  latanoprost (XALATAN) 0.005 % ophthalmic solution Place 1 drop into both eyes at bedtime.    [provider]  levothyroxine (SYNTHROID, LEVOTHROID) 25 MCG tablet Take 25 mcg by mouth daily at 12 noon. 04/02/16   [provider]  loratadine (CLARITIN REDITABS) 10 MG dissolvable tablet Take 10 mg by mouth daily. In am    [provider]  Misc Natural Products (BLACK CHERRY CONCENTRATE) LIQD Take 15 mLs by mouth daily.     [provider]  MISC NATURAL PRODUCTS PO Take by mouth. Beet juice capsules    [provider]  Multiple Vitamins-Minerals  (MULTIVITAMIN WITH MINERALS) tablet Take 1 tablet by mouth daily. In am    [provider]  Omega-3 Fatty Acids (FISH OIL) 1000 MG CAPS Take 1 capsule by mouth daily.    [provider]  primidone (MYSOLINE) 50 MG tablet Take 1 tablet by mouth 2 (two) times daily. 02/21/20 04/05/21  [provider]  ZINC GLUCONATE PO Take by mouth.    [provider]    ___________________________________________________________________________________________________ Physical Exam: BWIOMB with BMI 05/03/2020 05/03/2020 05/03/2020  Height - - -  Weight - - -  BMI - - -  Systolic [phone]  Diastolic 60 60 77  Pulse 58 - -     1. General:  in No  Acute distress   Chronically ill  -appearing 2. Psychological: Alert and  Oriented to self not situation 3. Head/ENT:   Dry Mucous Membranes                          Head Non traumatic, neck supple                           Poor Dentition 4. SKIN:   decreased Skin turgor,  Skin clean Dry and intact no rash 5. Heart: Regular rate and rhythm no Murmur, no Rub or gallop 6. Lungs:  Clear to auscultation bilaterally, no wheezes or crackles   7. Abdomen: Soft, non-tender, Non distended bowel sounds present 8. Lower extremities: no clubbing, cyanosis, no edema 9. Neurologically Grossly intact, moving all 4 extremities equally  10. MSK: Normal range of motion    Chart has been reviewed  ______________________________________________________________________________________________  Assessment/Plan   87 y.o. male with medical history significant of MGUS, CLL, diverticultis  arthritis, CKD stage 3, DM diet controlled  Admitted for lower Gi bleed  Present on Admission: . Lower GI bleed - - Suspect Lower Gi source  No hx of PUD,  But given patient is on aspirin has chronic back school thought on iron likely explains that and has a large hiatal hernia could not completely rule out upper GI bleed if becomes decompensated will need  emergent GI consult at night - Admit  For further management given:   Age >60 years,  comorbid illnesses  ,  gross rectal bleeding exposure to antiplatelet drugs   -  most likely Diverticular source     -  ER  Provider spoke to gastroenterology Dr. Enriqueta Shutter they will see patient in a.m. appreciate their consult   - serial CBC.    - Monitor for any recurrence,  evidence of hemodynamic instability or significant blood loss -  type and screen,  - Transfuse as needed for hemoglobin below 7 or <9 if evidence of significant  bleeding  - Establish at least 2 PIV and fluid resuscitate   - clear liquids for tonight keep nothing by mouth post midnight,  -  monitor for Recurrent significant  Bleeding of red blood and hemodynamic instability in which case Bleeding scan and IR consult would be indicated. CTA showed no significant bleed  Hiatal hernia-would benefit from general surgery evaluation.  Recommend consulting general surgery in a.m. per GI recommendations  . Thrombocytopenia ([place]) chronic stable no indication for transfusion at this time  . Hyperlipidemia, unspecified -chronic stable resume home medications when able to   . Bradycardia -appears to be sinus bradycardia order EKG hold propranolol  . Benign essential hypertension allow permissive hypertension for tonight  . Acquired hypothyroidism - - Check TSH continue home medications at current dose  . CKD (chronic kidney disease), stage IIIb (HCC) -  -chronic avoid nephrotoxic medications such as NSAIDs, Vanco Zosyn combo,  avoid hypotension, continue to follow renal function  DM 2-diet controlled order sliding scale and continue to monitor check hemoglobin A1c  Dementia monitor for any sign of sundowning continue home medications  CLL/MGUS chronic follows up by oncology    Other plan as per orders.  DVT prophylaxis:  SCD      Code Status:    Code Status: Prior FULL CODE as per patient  I had personally discussed CODE STATUS  with patient     Family Communication:   Family  at  Bedside  plan of care was discussed  with   Wife,    Disposition Plan:    To home once workup is complete and patient is stable   Following barriers for discharge:                                                      Will need to be able to tolerate PO                                                       Will need consultants to evaluate patient prior to discharge                       Consults called: GI is aware  Admission status:  ED Disposition    ED Disposition Condition [place]: [place] [[id number]]  Level of Care: Med-Surg [16]  Covid Evaluation: Asymptomatic Screening Protocol (No Symptoms)  Diagnosis: Lower GI bleed [867619]  Admitting Physician: Toy Baker [[id number]]  Attending Physician: Toy Baker [[id number]]  Estimated length of stay: past midnight tomorrow  Certification:: I certify this patient will need inpatient services for at least 2 midnights          inpatient     I Expect 2 midnight stay secondary to severity of patient's current illness need for inpatient interventions justified by the following:  hemodynamic instability despite optimal treatment (bradycardia  )   Severe lab/radiological/exam abnormalities including:     GI bleed on exam and extensive comorbidities including:   DM2  CKD  dementia  malignancy, .    That are currently affecting medical management.   I expect  patient to be hospitalized for 2 midnights requiring inpatient medical care.  Patient is at high risk for adverse outcome (such as loss of life or disability) if not treated.  Indication for inpatient stay as follows:    inability to maintain oral hydration    Need for operative/procedural  intervention    Need for   IV fluids,     Level of care    tele  For   24H         Precautions: admitted as  asymptomatic screening protocol   PPE: Used by the  provider:   N95  eye Goggles,  Gloves    Jannie Doyle 05/03/2020, 8:11 PM    [facility]     after 2 AM please page floor coverage PA If 7AM-7PM, please contact the day team taking care of the patient using [email or web address]   Patient was evaluated in the context of the global COVID-19 pandemic, which necessitated consideration that the patient might be at risk for infection with the SARS-CoV-2 virus that causes COVID-19. Institutional protocols and algorithms that pertain to the evaluation of patients at risk for COVID-19 are in a state of rapid change based on information released by regulatory bodies including the CDC and federal and state organizations. These policies and algorithms were followed during the patient's care.

## 2020-05-04 ENCOUNTER — Encounter: Payer: Self-pay | Admitting: Internal Medicine

## 2020-05-04 ENCOUNTER — Other Ambulatory Visit: Payer: Self-pay

## 2020-05-04 DIAGNOSIS — D696 Thrombocytopenia, unspecified: Secondary | ICD-10-CM | POA: Diagnosis not present

## 2020-05-04 DIAGNOSIS — K922 Gastrointestinal hemorrhage, unspecified: Secondary | ICD-10-CM | POA: Diagnosis not present

## 2020-05-04 DIAGNOSIS — K649 Unspecified hemorrhoids: Secondary | ICD-10-CM | POA: Diagnosis not present

## 2020-05-04 DIAGNOSIS — R001 Bradycardia, unspecified: Secondary | ICD-10-CM | POA: Diagnosis not present

## 2020-05-04 LAB — CBC
HCT: 40.8 % (ref 39.0–52.0)
Hemoglobin: 13.6 g/dL (ref 13.0–17.0)
MCH: 33.8 pg (ref 26.0–34.0)
MCHC: 33.3 g/dL (ref 30.0–36.0)
MCV: 101.5 fL — ABNORMAL HIGH (ref 80.0–100.0)
Platelets: 125 10*3/uL — ABNORMAL LOW (ref 150–400)
RBC: 4.02 MIL/uL — ABNORMAL LOW (ref 4.22–5.81)
RDW: 13.3 % (ref 11.5–15.5)
WBC: 8.1 10*3/uL (ref 4.0–10.5)
nRBC: 0 % (ref 0.0–0.2)

## 2020-05-04 LAB — COMPREHENSIVE METABOLIC PANEL
ALT: 18 U/L (ref 0–44)
AST: 23 U/L (ref 15–41)
Albumin: 3.6 g/dL (ref 3.5–5.0)
Alkaline Phosphatase: 65 U/L (ref 38–126)
Anion gap: 7 (ref 5–15)
BUN: 16 mg/dL (ref 8–23)
CO2: 27 mmol/L (ref 22–32)
Calcium: 8.6 mg/dL — ABNORMAL LOW (ref 8.9–10.3)
Chloride: 105 mmol/L (ref 98–111)
Creatinine, Ser: 0.98 mg/dL (ref 0.61–1.24)
GFR, Estimated: >60 mL/min (ref >60)
Glucose, Bld: 103 mg/dL — ABNORMAL HIGH (ref 70–99)
Potassium: 3.6 mmol/L (ref 3.5–5.1)
Sodium: 139 mmol/L (ref 135–145)
Total Bilirubin: 0.9 mg/dL (ref 0.3–1.2)
Total Protein: 6.6 g/dL (ref 6.5–8.1)

## 2020-05-04 LAB — CBC WITH DIFFERENTIAL/PLATELET
Abs Immature Granulocytes: 0.03 10*3/uL (ref 0.00–0.07)
Abs Immature Granulocytes: 0.02 10*3/uL (ref 0.00–0.07)
Basophils Absolute: 0.1 10*3/uL (ref 0.0–0.1)
Basophils Absolute: 0.1 10*3/uL (ref 0.0–0.1)
Basophils Relative: 1 %
Basophils Relative: 1 %
Eosinophils Absolute: 0.6 10*3/uL — ABNORMAL HIGH (ref 0.0–0.5)
Eosinophils Absolute: 0.4 10*3/uL (ref 0.0–0.5)
Eosinophils Relative: 7 %
Eosinophils Relative: 6 %
HCT: 39.6 % (ref 39.0–52.0)
HCT: 39.3 % (ref 39.0–52.0)
Hemoglobin: 13.3 g/dL (ref 13.0–17.0)
Hemoglobin: 13 g/dL (ref 13.0–17.0)
Immature Granulocytes: 0 %
Immature Granulocytes: 0 %
Lymphocytes Relative: 37 %
Lymphocytes Relative: 46 %
Lymphs Abs: 3.1 10*3/uL (ref 0.7–4.0)
Lymphs Abs: 3.6 10*3/uL (ref 0.7–4.0)
MCH: 34.2 pg — ABNORMAL HIGH (ref 26.0–34.0)
MCH: 33.9 pg (ref 26.0–34.0)
MCHC: 33.6 g/dL (ref 30.0–36.0)
MCHC: 33.1 g/dL (ref 30.0–36.0)
MCV: 101.8 fL — ABNORMAL HIGH (ref 80.0–100.0)
MCV: 102.6 fL — ABNORMAL HIGH (ref 80.0–100.0)
Monocytes Absolute: 1.3 10*3/uL — ABNORMAL HIGH (ref 0.1–1.0)
Monocytes Absolute: 0.7 10*3/uL (ref 0.1–1.0)
Monocytes Relative: 16 %
Monocytes Relative: 10 %
Neutro Abs: 3.3 10*3/uL (ref 1.7–7.7)
Neutro Abs: 2.9 10*3/uL (ref 1.7–7.7)
Neutrophils Relative %: 39 %
Neutrophils Relative %: 37 %
Platelets: 115 10*3/uL — ABNORMAL LOW (ref 150–400)
Platelets: 118 10*3/uL — ABNORMAL LOW (ref 150–400)
RBC: 3.89 MIL/uL — ABNORMAL LOW (ref 4.22–5.81)
RBC: 3.83 MIL/uL — ABNORMAL LOW (ref 4.22–5.81)
RDW: 13.5 % (ref 11.5–15.5)
RDW: 13.5 % (ref 11.5–15.5)
Smear Review: NORMAL
WBC: 8.4 10*3/uL (ref 4.0–10.5)
WBC: 7.7 10*3/uL (ref 4.0–10.5)
nRBC: 0 % (ref 0.0–0.2)
nRBC: 0 % (ref 0.0–0.2)

## 2020-05-04 LAB — GLUCOSE, CAPILLARY
Glucose-Capillary: 128 mg/dL — ABNORMAL HIGH (ref 70–99)
Glucose-Capillary: 101 mg/dL — ABNORMAL HIGH (ref 70–99)
Glucose-Capillary: 168 mg/dL — ABNORMAL HIGH (ref 70–99)

## 2020-05-04 LAB — SARS CORONAVIRUS 2 (TAT 6-24 HRS): SARS Coronavirus 2: NEGATIVE

## 2020-05-04 LAB — CBG MONITORING, ED
Glucose-Capillary: 95 mg/dL (ref 70–99)
Glucose-Capillary: 98 mg/dL (ref 70–99)

## 2020-05-04 LAB — VITAMIN B12: Vitamin B-12: 532 pg/mL (ref 180–914)

## 2020-05-04 LAB — TSH: TSH: 10.332 u[IU]/mL — ABNORMAL HIGH (ref 0.350–4.500)

## 2020-05-04 LAB — FOLATE: Folate: 48 ng/mL (ref >5.9)

## 2020-05-04 LAB — HEMOGLOBIN A1C
Hgb A1c MFr Bld: 6 % — ABNORMAL HIGH (ref 4.8–5.6)
Mean Plasma Glucose: 125.5 mg/dL

## 2020-05-04 LAB — PHOSPHORUS: Phosphorus: 3.1 mg/dL (ref 2.5–4.6)

## 2020-05-04 LAB — MAGNESIUM: Magnesium: 1.9 mg/dL (ref 1.7–2.4)

## 2020-05-04 MED ORDER — DEXTROSE-NACL 5-0.9 % IV SOLN: INTRAVENOUS | Status: DC

## 2020-05-04 NOTE — ED Notes (Signed)
Pt resting comfortably at this time. IVF stopped per [facility]. Pt is confused at this time and thinks it is night time and his "wife might be dead", this RN assured pt I will call wife and check in on her. Pt is in NAD. VSS. Call bell in reach.

## 2020-05-04 NOTE — Consult Note (Addendum)
Allen Antigua, MD [address] [place], [place], [place], [address] [address], [place], [place], [place], [address] Phone: [phone]  Fax: [phone]  Consultation  Referring Provider:     Dr. Roel Cluck Primary Care Physician:  Tracie Harrier, MD Reason for Consultation:     Rectal bleeding  Date of Admission:  05/03/2020 Date of Consultation:  05/04/2020         HPI:   Allen Chang is a 87 y.o. male with dementia with GI consulted for rectal bleeding.  Wife not at bedside at the time of evaluation today.  However, patient denies having any bright red blood per rectum recently.  He does not think he has seen any blood in his stool in the last 2 months.  As per H&P "patient flushed the toilet before his wife was able to see, but does report there was some blood mixed with stool and on the surface of the stool as well."  Patient himself states that his stool is always dark because he takes iron.  He does state that he has hemorrhoids and has intermittent rectal bleeding from them from time to time  Patient has an upper and lower endoscopy for iron deficiency anemia and history of colon polyps in December 2018 with Dr. Vira Agar.  This showed a medium size hiatal hernia, 2 small nonbleeding gastric erosions, few small nodules in the duodenal bulb.  Colonoscopy with 4 diminutive polyps removed.  Diverticulosis and internal hemorrhoids noted.  Pathology showed duodenal mucosa with moderate chronic inflammation and underlying nodular gastric tissue.  Chronic gastritis negative for H. Pylori. Colon Polyp showed tubular adenoma.  Past Medical History:  Diagnosis Date  . Cancer [facility])    prostate   . CKD (chronic kidney disease) stage 3, GFR 30-59 ml/min (HCC) 10/01/2016  . Complication of anesthesia    bradycardia, in ICU for 24 hour after galbladder surgery.   . Diverticulosis 2012  . Dysrhythmia    Heart skips a beat  . Elevated lipids   . GERD (gastroesophageal reflux  disease)   . History of hiatal hernia   . Hypertension   . Hypothyroidism   . Leukemia ([place])   . Prostate cancer ([place])   . Skin cancer    face, scalp, behind ear,back and hand  . Tremors of nervous system     Past Surgical History:  Procedure Laterality Date  . BLADDER TUMOR EXCISION    . CARDIAC CATHETERIZATION    . CHOLECYSTECTOMY N/A 10/10/2014   Procedure: LAPAROSCOPIC CHOLECYSTECTOMY WITH INTRAOPERATIVE CHOLANGIOGRAM;  Surgeon: Leonie Green, MD;  Location: [facility] ORS;  Service: General;  Laterality: N/A;  . COLONOSCOPY WITH PROPOFOL N/A 02/16/2017   Procedure: COLONOSCOPY WITH PROPOFOL;  Surgeon: Manya Silvas, MD;  Location: [facility] ENDOSCOPY;  Service: Endoscopy;  Laterality: N/A;  . ESOPHAGOGASTRODUODENOSCOPY (EGD) WITH PROPOFOL N/A 02/16/2017   Procedure: ESOPHAGOGASTRODUODENOSCOPY (EGD) WITH PROPOFOL;  Surgeon: Manya Silvas, MD;  Location: [facility] ENDOSCOPY;  Service: Endoscopy;  Laterality: N/A;  . HEMORRHOID SURGERY    . HERNIA REPAIR Left    x2  . JOINT REPLACEMENT Left    Partial Knee Replacement, Dr. Roland Rack  . PARTIAL KNEE ARTHROPLASTY Left 12/12/2014   Procedure: UNICOMPARTMENTAL KNEE;  Surgeon: Corky Mull, MD;  Location: [facility] ORS;  Service: Orthopedics;  Laterality: Left;  . prostate seeding    . SHOULDER ARTHROSCOPY Right   . SHOULDER ARTHROSCOPY WITH OPEN ROTATOR CUFF REPAIR Left 02/07/2016   Procedure: SHOULDER ARTHROSCOPY WITH OPEN  ROTATOR CUFF REPAIR;  Surgeon: Corky Mull, MD;  Location: [facility] ORS;  Service: Orthopedics;  Laterality: Left;    Prior to Admission medications   Medication Sig Start Date End Date Taking? Authorizing Provider  ascorbic acid (VITAMIN C) 1000 MG tablet Take 1,000 mg by mouth daily.   Yes [provider]  aspirin 81 MG tablet Take 81 mg by mouth daily. In am   Yes [provider]  b complex vitamins capsule Take 1 capsule by mouth daily.   Yes [provider]  calcium carbonate (OSCAL) 1500 (600  Ca) MG TABS tablet Take 1,500 mg by mouth daily with breakfast.   Yes [provider]  Cholecalciferol 25 MCG (1000 UT) tablet Take by mouth.   Yes [provider]  DULoxetine (CYMBALTA) 20 MG capsule Take 20 mg by mouth daily. 06/08/19  Yes [provider]  famotidine (PEPCID) 40 MG tablet Take 40 mg by mouth 2 (two) times daily. 04/28/19  Yes [provider]  ferrous sulfate 324 MG TBEC Take 324 mg by mouth 3 (three) times daily.    Yes [provider]  Flaxseed, Linseed, (FLAX SEED OIL) 1000 MG CAPS Take 1 capsule by mouth daily.   Yes [provider]  gabapentin (NEURONTIN) 100 MG capsule Take 100 mg by mouth 3 (three) times daily.  02/15/18 05/04/20 Yes [provider]  Garlic 4540 MG CAPS Take 1 capsule by mouth every morning.   Yes [provider]  levothyroxine (SYNTHROID) 50 MCG tablet Take 50 mcg by mouth daily. Take on an empty stomach with a glass of water at least 30-60 minutes before breakfast. 04/17/20 04/17/21 Yes [provider]  loratadine (CLARITIN REDITABS) 10 MG dissolvable tablet Take 10 mg by mouth daily. In am   Yes [provider]  Misc Natural Products (BLACK CHERRY CONCENTRATE) LIQD Take 15 mLs by mouth daily.    Yes [provider]  MISC NATURAL PRODUCTS PO Take by mouth. Beet juice capsules   Yes [provider]  Multiple Vitamins-Minerals (MULTIVITAMIN WITH MINERALS) tablet Take 1 tablet by mouth daily. In am   Yes [provider]  Omega-3 Fatty Acids (FISH OIL) 1000 MG CAPS Take 1 capsule by mouth daily.   Yes [provider]  primidone (MYSOLINE) 50 MG tablet Take 1 tablet by mouth 2 (two) times daily. 02/21/20 04/05/21 Yes [provider]  propranolol (INDERAL) 10 MG tablet Take 10 mg by mouth 2 (two) times daily.   Yes [provider]  ZINC GLUCONATE PO Take by mouth.   Yes [provider]  acetaminophen (TYLENOL) 500 MG  tablet Take 1,000 mg by mouth every 8 (eight) hours as needed for moderate pain.    [provider]  benzonatate (TESSALON) 200 MG capsule Take 1 capsule (200 mg total) by mouth 3 (three) times daily as needed for cough. Patient not taking: No sig reported 02/19/20   Coral Spikes, DO  DOCOSAHEXAENOIC ACID-EPA PO Take by mouth. 120-80 mg cap Patient not taking: Reported on 05/04/2020    [provider]  donepezil (ARICEPT) 5 MG tablet Take 5 mg by mouth at bedtime. 01/24/20 01/23/21  [provider]  doxycycline (VIBRAMYCIN) 100 MG capsule Take 1 capsule (100 mg total) by mouth 2 (two) times daily. Patient not taking: No sig reported 02/19/20   Coral Spikes, DO  latanoprost (XALATAN) 0.005 % ophthalmic solution Place 1 drop into both eyes at bedtime.    [provider]  levothyroxine (SYNTHROID, LEVOTHROID) 25 MCG tablet Take 25 mcg by mouth daily at 12 noon. Patient not taking: Reported on 05/04/2020 04/02/16   [provider]    Family History  Problem Relation Age of Onset  . Bone cancer Father   . Hypertension Mother   . Osteoporosis Mother   . Breast cancer Sister   . Cancer Brother   . Cancer Brother   . Lung cancer Brother   . Bladder Cancer Brother   . Melanoma Brother      Social History   Tobacco Use  . Smoking status: Never Smoker  . Smokeless tobacco: Never Used  Vaping Use  . Vaping Use: Never used  Substance Use Topics  . Alcohol use: No  . Drug use: No    Allergies as of 05/03/2020 - Review Complete 05/03/2020  Allergen Reaction Noted  . Oxycodone-acetaminophen Other (See Comments) 10/03/2014  . Percocet [oxycodone-acetaminophen] Other (See Comments) 10/09/2014  . Voltaren [diclofenac sodium] Palpitations 09/29/2014    Review of Systems:    All systems reviewed and negative except where noted in HPI.   Physical Exam:  Vital signs in last 24 hours: Vitals:   05/04/20 0800 05/04/20 1200 05/04/20 1248 05/04/20 1602   BP: (!) 150/78 (!) 144/68 103/89 (!) 142/82  Pulse: 67  (!) 50 (!) 52  Resp: 19 16 18 18   Temp:   98 F (36.7 C) 99 F (37.2 C)  TempSrc:   Oral   SpO2: 93% 97%  99%  Weight:      Height:       Last BM Date:  (unknown) General:   Pleasant, cooperative in NAD Head:  Normocephalic and atraumatic. Eyes:   No icterus.   Conjunctiva pink. PERRLA. Ears:  Normal auditory acuity. Neck:  Supple; no masses or thyroidomegaly Lungs: Respirations even and unlabored. Lungs clear to auscultation bilaterally.   No wheezes, crackles, or rhonchi.  Abdomen:  Soft, nondistended, nontender. Normal bowel sounds. No appreciable masses or hepatomegaly.  No rebound or guarding.  Rectal exam: Brown stool in the rectal vault, no melena or hematochezia Neurologic:  Alert and oriented x3;  grossly normal neurologically. Skin:  Intact without significant lesions or rashes. Cervical Nodes:  No significant cervical adenopathy. Psych:  Alert and cooperative. Normal affect.  LAB RESULTS: Recent Labs    05/03/20 2211 05/04/20 0414 05/04/20 1040  WBC 7.5 8.4 8.1  HGB 13.3 13.3 13.6  HCT 39.4 39.6 40.8  PLT 120* 115* 125*   BMET Recent Labs    05/03/20 1630 05/04/20 0519  NA 138 139  K 4.3 3.6  CL 103 105  CO2 29 27  GLUCOSE 107* 103*  BUN 21 16  CREATININE 1.47* 0.98  CALCIUM 9.2 8.6*   LFT Recent Labs    05/04/20 0519  PROT 6.6  ALBUMIN 3.6  AST 23  ALT 18  ALKPHOS 65  BILITOT 0.9   PT/INR Recent Labs    05/03/20 1630  LABPROT 13.1  INR 1.0    STUDIES: CT Angio Abd/Pel W and/or Wo Contrast  Result Date: 05/03/2020 CLINICAL DATA:  GI bleed.  Abdomen pelvis CT 02/25/2018 EXAM: CTA ABDOMEN AND PELVIS WITHOUT AND WITH CONTRAST TECHNIQUE: Multidetector CT imaging of the abdomen and pelvis was performed using the standard protocol during bolus administration of intravenous contrast. Multiplanar reconstructed images and MIPs were obtained and reviewed to evaluate the vascular anatomy.  CONTRAST:  159mL OMNIPAQUE IOHEXOL 350 MG/ML SOLN COMPARISON:  None. FINDINGS: VASCULAR  Aorta: Normal caliber aorta without aneurysm, dissection, vasculitis or significant stenosis. Moderate atherosclerosis. Celiac: Patent without evidence of aneurysm, dissection, vasculitis or significant stenosis. SMA: Patent without evidence of aneurysm, dissection, vasculitis or significant stenosis. Renals: Single bilateral renal arteries. Both renal arteries are patent. There is plaque at the origin of the left renal artery causing approximately 50% stenosis. No dissection, aneurysm, or vasculitis. IMA: Patent. Inflow: Patent without evidence of aneurysm, dissection, vasculitis or significant stenosis. Proximal Outflow: Bilateral common femoral and visualized portions of the superficial and profunda femoral arteries are patent without evidence of aneurysm, dissection, vasculitis or significant stenosis. Veins: Venous phase imaging demonstrates patent hepatic and portal veins. Mesenteric veins are patent. No portal venous or mesenteric gas. Iliac veins and IVC are unremarkable. Review of the MIP images confirms the above findings. NON-VASCULAR Lower chest: Large hiatal hernia. Adjacent compressive atelectasis in both lower lobes. There is also linear atelectasis or scarring in both dependent lower lobes. No pleural fluid. Heart size normal. Hepatobiliary: Tiny hypodensity in the right lobe of the liver, series 7, image 16, too small to characterize. Cholecystectomy without biliary dilatation. Pancreas: Fatty atrophy.  No ductal dilatation or inflammation. Spleen: Normal in size without focal abnormality. Adrenals/Urinary Tract: Normal adrenal glands. No hydronephrosis. Mild symmetric perinephric edema, typically chronic. Right renal cyst measures 15 mm, additional low-density bilateral renal cortical lesions are too small to characterize. Unremarkable urinary bladder. Stomach/Bowel: There is no contrast accumulation within the  GI tract to localize site of GI bleed. There is high-density barium within scattered colonic diverticula that is also present on precontrast exam. Moderate diffuse colonic diverticulosis without diverticulitis or acute colonic inflammation. Colonic tortuosity with moderate volume of stool. There is fecalization of small bowel contents. No small bowel obstruction or inflammation. Large hiatal hernia with the majority of the stomach being intrathoracic. Lymphatic: No abdominopelvic adenopathy. Reproductive: Brachytherapy seeds in the prostate gland. Other: Moderate-sized fat containing right inguinal hernia. Prior left inguinal hernia repair. There is residual fat in the left inguinal canal. No intra-abdominal free air or free fluid. No abscess. Musculoskeletal: There are no acute or suspicious osseous abnormalities. Degenerative change of both hips. Degenerative change in the spine with primarily facet hypertrophy. IMPRESSION: 1. No contrast accumulation within the GI tract to localize site of GI bleed. No evidence of bowel inflammation. 2. Moderate diffuse colonic diverticulosis without diverticulitis. 3. Large hiatal hernia with the majority of the stomach being intrathoracic. 4. Moderate fat containing right inguinal hernia. Prior left inguinal hernia repair. Aortic Atherosclerosis (ICD10-I70.0). Electronically Signed   By: Keith Rake M.D.   On: 05/03/2020 18:17      Impression / Plan:   UNDREA SHIPES is a 87 y.o. y/o male with dementia, brought by wife with ?  Rectal bleeding would completely normal hemoglobin since presentation and no active hematochezia, melena, hematemesis  ER provider note from yesterday reports "DRE shows grossly bloody stool" but my rectal exam today does not show any blood at all  Given the patient's hemoglobin has been completely normal, checked twice yesterday and again this morning, this is not a hemodynamically significant bleed  Clinical symptoms most consistent  with likely resolved hemorrhoid bleed (as gross blood is noted on DRE in the ER, but none present today ) with no drop in hemoglobin  CTA yesterday was negative for any active GI bleeding but was significant for very large hiatal hernia  Given above clinical symptoms with no active bleeding, endoscopic procedures in this elderly gentleman with a  very large hiatal hernia would have higher risks than benefits  In addition, given that he has already had a recent colonoscopy in 2018, risk of symptoms being from a malignancy is low  Would recommend close monitoring, high-fiber diet, CBCs as necessary  If recurrent bleeding recurs can reassess for flex sig versus colonoscopy at that time  Avoid constipation, goal of 1-2 soft bowel movements every day or every other day.  Start bowel regimen or fiber supplementation if not at goal  No alarm symptoms present to indicate urgent endoscopy at this time  Patient should follow-up in GI clinic within 1 to 2 weeks of discharge as well to reassess symptoms  Dr. Haig Prophet to follow the patient over the weekend  Thank you for involving me in the care of this patient.      LOS: 1 day   Virgel Manifold, MD  05/04/2020, 5:09 PM

## 2020-05-04 NOTE — ED Notes (Signed)
This RN attempted to call pt's Spouse, [name], by request with no answer. This RN did not leave a message and will try to call back at a later time.

## 2020-05-04 NOTE — Consult Note (Addendum)
[facility] SURGICAL CONSULTATION NOTE (initial) - cpt: 99255   HISTORY OF PRESENT ILLNESS (HPI):  87 y.o. male presented to [facility] ED 03/17 for evaluation of rectal bleeding. Patient reports the evening before presentation he noticed a "rumbling" in his abdomen. He described this as an aching pain. Yesterday morning, he went to the bathroom and noticed a large and grossly bloody bowel movement which was bright red in color. He does report that he has been straining more recently with BMs and feels this may be etiology of his bleeding. No fever, chills, nausea, emesis, CP, SOB, or urinary changes. He denied any history of GI bleeding in the past. He is not on any blood thinners. Previous abdominal surgeries positive for cholecystectomy and hernia repair. Work up in the ED revealed Hgb of 13.7 (now 13.3), normal WBC at 8.0K, stable renal function with sCr - 1.37 (baseline), and CTA Abdomen/Pelvis which was without evidence of GI bleeding but showed chronic diverticulosis without diverticulosis and large hiatal hernia.   Surgery is consulted by hospitalist physician Dr. Jennye Boroughs, MD in this context for evaluation and management of hiatal hernia.  Additionally on chart review through [place], he has followed with Dr Tiffany Kocher, MD and Tammi Klippel, PA-C in the past for chronic diverticulitis and his known hiatal hernia. It does appear he has had this hernia since at least 2008 based on imaging review available in Epic. He states he has known about this hiatal hernia "for a long time now."  He denied any chest pain, trouble swallowing, nausea, or heartburn symptoms. He was last seen by GI in 06/2019, and at that time his chronic LLQ pain had improved. His hiatal hernia had been worked up for this with barium swallow that showed esophogeal spasm and mild GERD with a large hiatal hernia, and continued conservative management of this. His last colonoscopy and EGD were in 2018. I did  review these. EGD showed two erosions in the Antrum and moderate sized hiatal hernia. Colonoscopy showed "2 diminutive TAs, 1 diminutive hyperplastic polyp in rectum. Diminutive TA in descending colon. Sigmoid, descending, transverse, and ascending diverticulosis. Small internal hemorrhoids."    PAST MEDICAL HISTORY (PMH):  Past Medical History:  Diagnosis Date  . Cancer [facility])    prostate   . CKD (chronic kidney disease) stage 3, GFR 30-59 ml/min (HCC) 10/01/2016  . Complication of anesthesia    bradycardia, in ICU for 24 hour after galbladder surgery.   . Diverticulosis 2012  . Dysrhythmia    Heart skips a beat  . Elevated lipids   . GERD (gastroesophageal reflux disease)   . History of hiatal hernia   . Hypertension   . Hypothyroidism   . Leukemia ([place])   . Prostate cancer ([place])   . Skin cancer    face, scalp, behind ear,back and hand  . Tremors of nervous system      PAST SURGICAL HISTORY ([place]):  Past Surgical History:  Procedure Laterality Date  . BLADDER TUMOR EXCISION    . CARDIAC CATHETERIZATION    . CHOLECYSTECTOMY N/A 10/10/2014   Procedure: LAPAROSCOPIC CHOLECYSTECTOMY WITH INTRAOPERATIVE CHOLANGIOGRAM;  Surgeon: Leonie Green, MD;  Location: [facility] ORS;  Service: General;  Laterality: N/A;  . COLONOSCOPY WITH PROPOFOL N/A 02/16/2017   Procedure: COLONOSCOPY WITH PROPOFOL;  Surgeon: Manya Silvas, MD;  Location: [facility] ENDOSCOPY;  Service: Endoscopy;  Laterality: N/A;  . ESOPHAGOGASTRODUODENOSCOPY (EGD) WITH PROPOFOL N/A 02/16/2017   Procedure: ESOPHAGOGASTRODUODENOSCOPY (EGD) WITH PROPOFOL;  Surgeon: Gaylyn Cheers  T, MD;  Location: [facility] ENDOSCOPY;  Service: Endoscopy;  Laterality: N/A;  . HEMORRHOID SURGERY    . HERNIA REPAIR Left    x2  . JOINT REPLACEMENT Left    Partial Knee Replacement, Dr. Roland Rack  . PARTIAL KNEE ARTHROPLASTY Left 12/12/2014   Procedure: UNICOMPARTMENTAL KNEE;  Surgeon: Corky Mull, MD;  Location: [facility] ORS;  Service: Orthopedics;   Laterality: Left;  . prostate seeding    . SHOULDER ARTHROSCOPY Right   . SHOULDER ARTHROSCOPY WITH OPEN ROTATOR CUFF REPAIR Left 02/07/2016   Procedure: SHOULDER ARTHROSCOPY WITH OPEN ROTATOR CUFF REPAIR;  Surgeon: Corky Mull, MD;  Location: [facility] ORS;  Service: Orthopedics;  Laterality: Left;     MEDICATIONS:  Prior to Admission medications   Medication Sig Start Date End Date Taking? Authorizing Provider  levothyroxine (SYNTHROID) 50 MCG tablet Take 50 mcg by mouth daily. Take on an empty stomach with a glass of water at least 30-60 minutes before breakfast. 04/17/20 04/17/21 Yes [provider]  propranolol (INDERAL) 10 MG tablet Take 10 mg by mouth 2 (two) times daily.   Yes [provider]  acetaminophen (TYLENOL) 500 MG tablet Take 1,000 mg by mouth every 8 (eight) hours as needed for moderate pain.    [provider]  ascorbic acid (VITAMIN C) 1000 MG tablet Take 1,000 mg by mouth daily.    [provider]  aspirin 81 MG tablet Take 81 mg by mouth daily. In am    [provider]  b complex vitamins capsule Take 1 capsule by mouth daily.    [provider]  benzonatate (TESSALON) 200 MG capsule Take 1 capsule (200 mg total) by mouth 3 (three) times daily as needed for cough. Patient not taking: Reported on 04/16/2020 02/19/20   Coral Spikes, DO  calcium carbonate (OSCAL) 1500 (600 Ca) MG TABS tablet Take by mouth daily with breakfast.    [provider]  Cholecalciferol 25 MCG (1000 UT) tablet Take by mouth. Patient not taking: Reported on 04/16/2020    [provider]  DOCOSAHEXAENOIC ACID-EPA PO Take by mouth. 120-80 mg cap    [provider]  donepezil (ARICEPT) 5 MG tablet Take by mouth. 01/24/20 01/23/21  [provider]  doxycycline (VIBRAMYCIN) 100 MG capsule Take 1 capsule (100 mg total) by mouth 2 (two) times daily. Patient not taking: Reported on 04/16/2020 02/19/20   Coral Spikes, DO  DULoxetine  (CYMBALTA) 20 MG capsule Take 20 mg by mouth daily. 06/08/19   [provider]  famotidine (PEPCID) 40 MG tablet Take 40 mg by mouth 2 (two) times daily. 04/28/19   [provider]  ferrous sulfate 324 MG TBEC Take 324 mg by mouth 3 (three) times daily.     [provider]  Flaxseed, Linseed, (FLAX SEED OIL) 1000 MG CAPS Take 1 capsule by mouth daily.    [provider]  gabapentin (NEURONTIN) 100 MG capsule Take 100 mg by mouth 3 (three) times daily.  02/15/18 04/16/20  [provider]  Garlic 3419 MG CAPS Take 1 capsule by mouth every morning.    [provider]  latanoprost (XALATAN) 0.005 % ophthalmic solution Place 1 drop into both eyes at bedtime.    [provider]  levothyroxine (SYNTHROID, LEVOTHROID) 25 MCG tablet Take 25 mcg by mouth daily at 12 noon. 04/02/16   [provider]  loratadine (CLARITIN REDITABS) 10 MG dissolvable tablet Take 10 mg by mouth daily. In am  [provider]  Misc Natural Products (BLACK CHERRY CONCENTRATE) LIQD Take 15 mLs by mouth daily.     [provider]  MISC NATURAL PRODUCTS PO Take by mouth. Beet juice capsules    [provider]  Multiple Vitamins-Minerals (MULTIVITAMIN WITH MINERALS) tablet Take 1 tablet by mouth daily. In am    [provider]  Omega-3 Fatty Acids (FISH OIL) 1000 MG CAPS Take 1 capsule by mouth daily.    [provider]  primidone (MYSOLINE) 50 MG tablet Take 1 tablet by mouth 2 (two) times daily. 02/21/20 04/05/21  [provider]  ZINC GLUCONATE PO Take by mouth.    [provider]     ALLERGIES:  Allergies  Allergen Reactions  . Oxycodone-Acetaminophen Other (See Comments)    Other reaction(s): Hallucinations Other reaction(s): Other (See Comments) Hallucination Hallucination Hallucination  . Percocet [Oxycodone-Acetaminophen] Other (See Comments)    Hallucination   . Voltaren [Diclofenac  Sodium] Palpitations     SOCIAL HISTORY:  Social History   Socioeconomic History  . Marital status: Married    Spouse name: Not on file  . Number of children: Not on file  . Years of education: Not on file  . Highest education level: Not on file  Occupational History  . Not on file  Tobacco Use  . Smoking status: Never Smoker  . Smokeless tobacco: Never Used  Vaping Use  . Vaping Use: Never used  Substance and Sexual Activity  . Alcohol use: No  . Drug use: No  . Sexual activity: Yes  Other Topics Concern  . Not on file  Social History Narrative  . Not on file   Social Determinants of Health   Financial Resource Strain: Not on file  Food Insecurity: Not on file  Transportation Needs: Not on file  Physical Activity: Not on file  Stress: Not on file  Social Connections: Not on file  Intimate Partner Violence: Not on file     FAMILY HISTORY:  Family History  Problem Relation Age of Onset  . Bone cancer Father   . Hypertension Mother   . Osteoporosis Mother   . Breast cancer Sister   . Cancer Brother   . Cancer Brother   . Lung cancer Brother   . Bladder Cancer Brother   . Melanoma Brother       REVIEW OF SYSTEMS:  Review of Systems  Constitutional: Negative for chills and fever.  HENT: Negative for congestion and sore throat.   Respiratory: Negative for cough and shortness of breath.   Cardiovascular: Negative for chest pain and palpitations.  Gastrointestinal: Positive for blood in stool and constipation. Negative for abdominal pain, diarrhea, heartburn, nausea and vomiting.  Genitourinary: Negative for dysuria and urgency.    VITAL SIGNS:  Temp:  [98.5 F (36.9 C)] 98.5 F (36.9 C) (03/17 1547) Pulse Rate:  [47-58] 56 (03/18 0400) Resp:  [10-24] 10 (03/18 0700) BP: (136-163)/(60-77) 138/63 (03/18 0600) SpO2:  [93 %-100 %] 94 % (03/18 0400) Weight:  [78 kg] 78 kg (03/17 1547)     Height: 5\' 9"  (175.3 cm) Weight: 78 kg BMI (Calculated): 25.39    INTAKE/OUTPUT:  No intake/output data recorded.  PHYSICAL EXAM:  Physical Exam Vitals and nursing note reviewed. Exam conducted with a chaperone present.  Constitutional:      General: He is not in acute distress.    Appearance: Normal appearance. He is not ill-appearing.     Comments: Patient resting comfortably in bed  HENT:     Head: Normocephalic and atraumatic.     Mouth/Throat:     Mouth: Mucous membranes are moist.     Pharynx: Oropharynx is clear.  Eyes:     General: No scleral icterus.    Conjunctiva/sclera: Conjunctivae normal.     Pupils: Pupils are equal, round, and reactive to light.  Cardiovascular:     Rate and Rhythm: Normal rate and regular rhythm.     Pulses: Normal pulses.     Heart sounds: No murmur heard.   Pulmonary:     Effort: Pulmonary effort is normal. No respiratory distress.     Breath sounds: Normal breath sounds.  Abdominal:     General: A surgical scar is present. There is no distension.     Palpations: Abdomen is soft.     Tenderness: There is no abdominal tenderness. There is no guarding or rebound.     Comments: Abdomen is soft, non-tender, non-distended, no rebound/guarding. Previous laparoscopic scars seen  Genitourinary:    Rectum: No external hemorrhoid.     Comments: No active bleeding from rectum, no gross external hemorrhoids  Musculoskeletal:        General: Normal range of motion.     Right lower leg: No edema.     Left lower leg: No edema.  Skin:    General: Skin is warm and dry.     Coloration: Skin is not pale.     Findings: No erythema.  Neurological:     General: No focal deficit present.     Mental Status: He is alert and oriented to person, place, and time.  Psychiatric:        Mood and Affect: Mood normal.        Behavior: Behavior normal.      Labs:  CBC Latest Ref Rng & Units 05/04/2020 05/03/2020 05/03/2020  WBC 4.0 - 10.5 K/uL 8.4 7.5 8.0  Hemoglobin 13.0 - 17.0 g/dL 13.3 13.3 13.7  Hematocrit 39.0 -  52.0 % 39.6 39.4 41.0  Platelets 150 - 400 K/uL 115(L) 120(L) 131(L)   CMP Latest Ref Rng & Units 05/04/2020 05/03/2020 02/14/2020  Glucose 70 - 99 mg/dL 103(H) 107(H) 98  BUN 8 - 23 mg/dL 16 21 19   Creatinine 0.61 - 1.24 mg/dL 0.98 1.47(H) 1.38(H)  Sodium 135 - 145 mmol/L 139 138 142  Potassium 3.5 - 5.1 mmol/L 3.6 4.3 3.7  Chloride 98 - 111 mmol/L 105 103 102  CO2 22 - 32 mmol/L 27 29 30   Calcium 8.9 - 10.3 mg/dL 8.6(L) 9.2 9.6  Total Protein 6.5 - 8.1 g/dL 6.6 6.8 7.6  Total Bilirubin 0.3 - 1.2 mg/dL 0.9 0.6 0.7  Alkaline Phos 38 - 126 U/L 65 68 62  AST 15 - 41 U/L 23 23 23   ALT 0 - 44 U/L 18 20 17      Imaging studies:   CTA Abdomen/Pelvis (05/03/2020) personally reviewed which shows known large hiatal hernia, relatively unchanged from prior imaging, and chronic diverticulosis without diverticulitis, and radiologist report reviewed below:  IMPRESSION: 1. No contrast accumulation within the GI tract to localize site of GI bleed. No evidence of bowel inflammation. 2. Moderate diffuse colonic diverticulosis without diverticulitis. 3. Large hiatal hernia with the majority of the stomach being intrathoracic. 4. Moderate fat containing right inguinal hernia. Prior left inguinal hernia repair.   Assessment/Plan: (ICD-10's: K53.2) 87 y.o. male presenting with signs/symptoms of lower GI bleeding, now resolved, with stable Hgb, complicated by pertinent comorbidities including history of  hiatal hernia and diverticulosis.  Chronic Hiatal Hernia  - After extensive chart review, it does appear this hiatal hernia is chronic in nature as it is present on imaging back to 2008, which the patient corroborates stating "I have had this for a long time." He is currently asymptomatic of this. He has been followed by GI for this as an outpatient and is currently managing well on famotidine 40 mg BID. I do not think this is the source of his GI bleed nor does he warrant any surgical intervention. I did  discuss role of surgery in hiatal hernias; however, he states that "he is 31 and does not feel like pursuing any surgical intervention." Recommend continue current management, follow up yearly as scheduled with GI, and we will be happy to see him as an outpatient should he become increasingly symptomatic or desire surgical consideration.   Diverticulosis  - He does have a history of diverticulosis as well, which could be another potential source for transient GI bleed. He also follows with GI for this as an outpatient. It appears he had a difficult episode in April of 2019 but was ultimately managed with Abx alone. He was offered surgical consultation at that time, but deferred to pursue this. I briefly discussed this with him and again he states that he is not interested in pursuing any surgery for this. Currently, he is without any abdominal pain or radiographic findings concerning for acute diverticulitis. He can continue to follow up outpatient with GI yearly as scheduled and we will be available as an outpatient should need arise.   Lower GI Bleeding  - From a GI bleeding perspective, this seems to have been a transient episode and now resolved. His Hgb has been stable. He does endorse increase straining with bowel movements recently and I question if this episode of bleeding may be attributable to internal hemorrhoids seen on colonoscopy in 2018. No external hemorrhoids present on external examination. Defer colonoscopy consideration to GI service    - Further management per primary service; we will sign off but be available.   All of the above findings and recommendations were discussed with the patient, and all of patient's questions were answered to his expressed satisfaction.  Thank you for the opportunity to participate in this patient's care.   Face-to-face time spent with the patient and care providers was 70 minutes, with more than 50% of the time spent counseling, educating, and  coordinating care of the patient.    -- Edison Simon, PA-C [facility] 05/04/2020, 7:45 AM [phone] M-F: 7am - 4pm

## 2020-05-04 NOTE — Progress Notes (Signed)
Progress Note    Allen Chang  HDQ:[id number] DOB: January 24, 1933  DOA: 05/03/2020 PCP: Tracie Harrier, MD      Brief Narrative:    Medical records reviewed and are as summarized below:  Allen Chang is a 87 y.o. male with medical history significant for dementia, MGUS, CLL, diverticulitis, arthritis, CKD stage IIIa, type II DM, who presented to the hospital because of dizziness and rectal bleeding.  He takes a baby aspirin and iron tablets at home.  He was admitted to the hospital for acute GI bleeding, likely lower GI bleeding.    Assessment/Plan:   Active Problems:   Bradycardia   Thrombocytopenia (HCC)   Acquired hypothyroidism   Benign essential hypertension   Hyperlipidemia, unspecified   Lower GI bleed   CKD (chronic kidney disease), stage III (HCC)    Body mass index is 25.4 kg/m.    Acute GI bleeding, probably lower GI bleeding: Aspirin has been held.  Keep n.p.o. continue IV fluids for now.  Monitor H&H.  Awaiting further recommendations from gastroenterologist.  Large hiatal hernia: Consulted general surgeon.  According to surgeon, there is nothing to do from a surgical standpoint surgery has signed off the case.  Sinus bradycardia: Asymptomatic.  Patient is on propranolol at home.  He is also on Aricept.  Chronic thrombocytopenia: Platelet count is stable.  Continue to monitor.  Dementia: Continue Aricept.  Continue supportive care.  Other comorbidities include hypothyroidism, CKD stage IIIa, hyperlipidemia, hypertension, MGUS.  Diet Order            Diet NPO time specified  Diet effective midnight                    Consultants:  Gastroenterologist  General surgeon  Procedures:  None    Medications:   . donepezil  5 mg Oral QHS  . gabapentin  100 mg Oral TID  . insulin aspart  0-9 Units Subcutaneous Q4H  . latanoprost  1 drop Both Eyes QHS  . levothyroxine  25 mcg Oral Q1200  . pantoprazole (PROTONIX) IV  40 mg  Intravenous Q12H   Continuous Infusions:   Anti-infectives (From admission, onward)   None             Family Communication/Anticipated D/C date and plan/Code Status   DVT prophylaxis: SCDs Start: 05/03/20 2052     Code Status: Full Code  Family Communication: None Disposition Plan:    Status is: Inpatient  Remains inpatient appropriate because:IV treatments appropriate due to intensity of illness or inability to take PO and Inpatient level of care appropriate due to severity of illness   Dispo: The patient is from: Home              Anticipated d/c is to: Home              Patient currently is not medically stable to d/c.   Difficult to place patient No           Subjective:   Interval events noted.  He is confused and unable to provide any history.  He denies any abdominal pain or rectal bleeding at this time.  Objective:    Vitals:   05/04/20 0630 05/04/20 0645 05/04/20 0700 05/04/20 0800  BP:    (!) 150/78  Pulse:    67  Resp: 16 20 10 19   Temp:      SpO2:    93%  Weight:  Height:       No data found.  No intake or output data in the 24 hours ending 05/04/20 0853 Filed Weights   05/03/20 1547  Weight: 78 kg    Exam:  GEN: NAD SKIN: No rash EYES: EOMI ENT: MMM CV: RRR PULM: CTA B ABD: soft, ND, NT, +BS CNS: AAO x 1 (person), confused, non focal EXT: No edema or tenderness        Data Reviewed:   I have personally reviewed following labs and imaging studies:  Labs: Labs show the following:   Basic Metabolic Panel: Recent Labs  Lab 05/03/20 1630 05/04/20 0519  NA 138 139  K 4.3 3.6  CL 103 105  CO2 29 27  GLUCOSE 107* 103*  BUN 21 16  CREATININE 1.47* 0.98  CALCIUM 9.2 8.6*  MG  --  1.9  PHOS  --  3.1   GFR Estimated Creatinine Clearance: 55.1 mL/min (by C-G formula based on SCr of 0.98 mg/dL). Liver Function Tests: Recent Labs  Lab 05/03/20 1630 05/04/20 0519  AST 23 23  ALT 20 18  ALKPHOS  68 65  BILITOT 0.6 0.9  PROT 6.8 6.6  ALBUMIN 3.6 3.6   No results for input(s): LIPASE, AMYLASE in the last 168 hours. No results for input(s): AMMONIA in the last 168 hours. Coagulation profile Recent Labs  Lab 05/03/20 1630  INR 1.0    CBC: Recent Labs  Lab 05/03/20 1630 05/03/20 2211 05/04/20 0414  WBC 8.0 7.5 8.4  NEUTROABS  --  2.3 3.3  HGB 13.7 13.3 13.3  HCT 41.0 39.4 39.6  MCV 102.8* 101.3* 101.8*  PLT 131* 120* 115*   Cardiac Enzymes: No results for input(s): CKTOTAL, CKMB, CKMBINDEX, TROPONINI in the last 168 hours. BNP (last 3 results) No results for input(s): PROBNP in the last 8760 hours. CBG: Recent Labs  Lab 05/03/20 2104 05/03/20 2333 05/04/20 0412 05/04/20 0852  GLUCAP 86 89 95 98   D-Dimer: No results for input(s): DDIMER in the last 72 hours. Hgb A1c: No results for input(s): HGBA1C in the last 72 hours. Lipid Profile: No results for input(s): CHOL, HDL, LDLCALC, TRIG, CHOLHDL, LDLDIRECT in the last 72 hours. Thyroid function studies: Recent Labs    05/04/20 0519  TSH 10.332*   Anemia work up: No results for input(s): VITAMINB12, FOLATE, FERRITIN, TIBC, IRON, RETICCTPCT in the last 72 hours. Sepsis Labs: Recent Labs  Lab 05/03/20 1630 05/03/20 2210 05/03/20 2211 05/04/20 0414  WBC 8.0  --  7.5 8.4  LATICACIDVEN 1.5 1.0  --   --     Microbiology Recent Results (from the past 240 hour(s))  SARS CORONAVIRUS 2 (TAT 6-24 HRS) Nasopharyngeal Nasopharyngeal Swab     Status: None   Collection Time: 05/03/20 11:35 PM   Specimen: Nasopharyngeal Swab  Result Value Ref Range Status   SARS Coronavirus 2 NEGATIVE NEGATIVE Final    Comment: (NOTE) SARS-CoV-2 target nucleic acids are NOT DETECTED.  The SARS-CoV-2 RNA is generally detectable in upper and lower respiratory specimens during the acute phase of infection. Negative results do not preclude SARS-CoV-2 infection, do not rule out co-infections with other pathogens, and should  not be used as the sole basis for treatment or other patient management decisions. Negative results must be combined with clinical observations, patient history, and epidemiological information. The expected result is Negative.  Fact Sheet for Patients: SugarRoll.be  Fact Sheet for Healthcare Providers: https://www.woods-mathews.com/  This test is not yet approved or cleared by  the [employer] and  has been authorized for detection and/or diagnosis of SARS-CoV-2 by FDA under an Emergency Use Authorization (EUA). This EUA will remain  in effect (meaning this test can be used) for the duration of the COVID-19 declaration under Se ction 564(b)(1) of the Act, 21 U.S.C. section 360bbb-3(b)(1), unless the authorization is terminated or revoked sooner.  Performed at [facility], [place] [address] [place], [place] [address]     Procedures and diagnostic studies:  CT Angio Abd/Pel W and/or Wo Contrast  Result Date: 05/03/2020 CLINICAL DATA:  GI bleed.  Abdomen pelvis CT 02/25/2018 EXAM: CTA ABDOMEN AND PELVIS WITHOUT AND WITH CONTRAST TECHNIQUE: Multidetector CT imaging of the abdomen and pelvis was performed using the standard protocol during bolus administration of intravenous contrast. Multiplanar reconstructed images and MIPs were obtained and reviewed to evaluate the vascular anatomy. CONTRAST:  125mL OMNIPAQUE IOHEXOL 350 MG/ML SOLN COMPARISON:  None. FINDINGS: VASCULAR Aorta: Normal caliber aorta without aneurysm, dissection, vasculitis or significant stenosis. Moderate atherosclerosis. Celiac: Patent without evidence of aneurysm, dissection, vasculitis or significant stenosis. SMA: Patent without evidence of aneurysm, dissection, vasculitis or significant stenosis. Renals: Single bilateral renal arteries. Both renal arteries are patent. There is plaque at the origin of the left renal artery causing approximately 50% stenosis. No  dissection, aneurysm, or vasculitis. IMA: Patent. Inflow: Patent without evidence of aneurysm, dissection, vasculitis or significant stenosis. Proximal Outflow: Bilateral common femoral and visualized portions of the superficial and profunda femoral arteries are patent without evidence of aneurysm, dissection, vasculitis or significant stenosis. Veins: Venous phase imaging demonstrates patent hepatic and portal veins. Mesenteric veins are patent. No portal venous or mesenteric gas. Iliac veins and IVC are unremarkable. Review of the MIP images confirms the above findings. NON-VASCULAR Lower chest: Large hiatal hernia. Adjacent compressive atelectasis in both lower lobes. There is also linear atelectasis or scarring in both dependent lower lobes. No pleural fluid. Heart size normal. Hepatobiliary: Tiny hypodensity in the right lobe of the liver, series 7, image 16, too small to characterize. Cholecystectomy without biliary dilatation. Pancreas: Fatty atrophy.  No ductal dilatation or inflammation. Spleen: Normal in size without focal abnormality. Adrenals/Urinary Tract: Normal adrenal glands. No hydronephrosis. Mild symmetric perinephric edema, typically chronic. Right renal cyst measures 15 mm, additional low-density bilateral renal cortical lesions are too small to characterize. Unremarkable urinary bladder. Stomach/Bowel: There is no contrast accumulation within the GI tract to localize site of GI bleed. There is high-density barium within scattered colonic diverticula that is also present on precontrast exam. Moderate diffuse colonic diverticulosis without diverticulitis or acute colonic inflammation. Colonic tortuosity with moderate volume of stool. There is fecalization of small bowel contents. No small bowel obstruction or inflammation. Large hiatal hernia with the majority of the stomach being intrathoracic. Lymphatic: No abdominopelvic adenopathy. Reproductive: Brachytherapy seeds in the prostate gland.  Other: Moderate-sized fat containing right inguinal hernia. Prior left inguinal hernia repair. There is residual fat in the left inguinal canal. No intra-abdominal free air or free fluid. No abscess. Musculoskeletal: There are no acute or suspicious osseous abnormalities. Degenerative change of both hips. Degenerative change in the spine with primarily facet hypertrophy. IMPRESSION: 1. No contrast accumulation within the GI tract to localize site of GI bleed. No evidence of bowel inflammation. 2. Moderate diffuse colonic diverticulosis without diverticulitis. 3. Large hiatal hernia with the majority of the stomach being intrathoracic. 4. Moderate fat containing right inguinal hernia. Prior left inguinal hernia repair. Aortic Atherosclerosis (ICD10-I70.0). Electronically Signed  By: Keith Rake M.D.   On: 05/03/2020 18:17               LOS: 1 day   Allen Chang  [facility]   Pager on [email or web address]. If 7PM-7AM, please contact night-coverage at [email or web address]     05/04/2020, 8:53 AM

## 2020-05-04 NOTE — ED Notes (Signed)
Recollect light green sent to lab.

## 2020-05-05 DIAGNOSIS — K922 Gastrointestinal hemorrhage, unspecified: Secondary | ICD-10-CM | POA: Diagnosis not present

## 2020-05-05 DIAGNOSIS — D696 Thrombocytopenia, unspecified: Secondary | ICD-10-CM | POA: Diagnosis not present

## 2020-05-05 DIAGNOSIS — R001 Bradycardia, unspecified: Secondary | ICD-10-CM | POA: Diagnosis not present

## 2020-05-05 LAB — GLUCOSE, CAPILLARY
Glucose-Capillary: 113 mg/dL — ABNORMAL HIGH (ref 70–99)
Glucose-Capillary: 115 mg/dL — ABNORMAL HIGH (ref 70–99)
Glucose-Capillary: 120 mg/dL — ABNORMAL HIGH (ref 70–99)

## 2020-05-05 LAB — CBC
HCT: 37.3 % — ABNORMAL LOW (ref 39.0–52.0)
Hemoglobin: 12.6 g/dL — ABNORMAL LOW (ref 13.0–17.0)
MCH: 34.3 pg — ABNORMAL HIGH (ref 26.0–34.0)
MCHC: 33.8 g/dL (ref 30.0–36.0)
MCV: 101.6 fL — ABNORMAL HIGH (ref 80.0–100.0)
Platelets: 115 10*3/uL — ABNORMAL LOW (ref 150–400)
RBC: 3.67 MIL/uL — ABNORMAL LOW (ref 4.22–5.81)
RDW: 13.3 % (ref 11.5–15.5)
WBC: 7.3 10*3/uL (ref 4.0–10.5)
nRBC: 0 % (ref 0.0–0.2)

## 2020-05-05 MED ORDER — HALOPERIDOL LACTATE 5 MG/ML IJ SOLN
2.0000 mg | Freq: Once | INTRAMUSCULAR | Status: AC
  Administered 2020-05-05: 2 mg via INTRAVENOUS
  Filled 2020-05-05: qty 1

## 2020-05-05 NOTE — Progress Notes (Signed)
Resident went home with his wife . Went over discharge instruction with wife as well as patient. No complain and question at time of discharge.

## 2020-05-05 NOTE — Discharge Summary (Signed)
Physician Discharge Summary  Allen Chang FXT:[id number] DOB: 12/15/1932 DOA: 05/03/2020  PCP: Tracie Harrier, MD  Admit date: 05/03/2020 Discharge date: 05/05/2020  Discharge disposition: Home   Recommendations for Outpatient Follow-Up:   Follow up with PCP in 1 week  Discharge Diagnosis:   Active Problems:   Bradycardia   Thrombocytopenia (HCC)   Acquired hypothyroidism   Benign essential hypertension   Hyperlipidemia, unspecified   Lower GI bleed   CKD (chronic kidney disease), stage III ([place])    Discharge Condition: Stable.  Diet recommendation:  Diet Order            Diet - low sodium heart healthy           Diet regular Room service appropriate? Yes; Fluid consistency: Thin  Diet effective now                   Code Status: Full Code     Hospital Course:   Mr. Allen Chang is a 87 y.o. male with medical history significant for dementia, MGUS, CLL, diverticulitis, arthritis, CKD stage IIIa, type II DM, who presented to the hospital because of dizziness and rectal bleeding.  He takes a baby aspirin and iron tablets at home.  He was admitted to the hospital for acute GI bleeding, likely lower GI bleeding.  He was treated with IV fluids and IV Protonix.  He was taking aspirin prior to admission and this was discontinued.  He was evaluated by gastroenterologist for acute GI bleed.  From gastroenterologist perspective, there was no indication for urgent endoscopic work-up because rectal bleeding had resolved. H&H remained stable. Patient was also evaluated by the general surgeon because of the large hiatal hernia.  General surgeon signed off because there was no indication for surgical management.   Patient was found to be bradycardic but he was asymptomatic.  Heart rate was in the 40s and 50s.  He was taking propranolol and Aricept.  Aricept has been discontinued.  Close follow-up with PCP was strongly recommended for further management.  His condition  has improved and is stable stable for discharge to home today.  His heart rate was in the 70s prior to discharge.  Discharge plan was discussed with patient's wife over the phone.   Medical Consultants:    General surgeon  Gastroenterologist   Discharge Exam:    Vitals:   05/04/20 2001 05/05/20 0018 05/05/20 0436 05/05/20 0827  BP: 117/68 (!) 165/91 (!) 152/90 (!) 145/66  Pulse: (!) 56 (!) 53 77 72  Resp: 16 16 16 16   Temp: (!) 97.5 F (36.4 C) 98 F (36.7 C) 98.4 F (36.9 C) 97.7 F (36.5 C)  TempSrc: Oral Oral Oral Oral  SpO2: 98% 92% 95% 98%  Weight:      Height:         GEN: NAD SKIN: Warm and dry EYES: No pallor or icterus ENT: MMM CV: RRR PULM: CTA B ABD: soft, ND, NT, +BS CNS: AAO x 3, non focal EXT: No edema or tenderness   The results of significant diagnostics from this hospitalization (including imaging, microbiology, ancillary and laboratory) are listed below for reference.     Procedures and Diagnostic Studies:   CT Angio Abd/Pel W and/or Wo Contrast  Result Date: 05/03/2020 CLINICAL DATA:  GI bleed.  Abdomen pelvis CT 02/25/2018 EXAM: CTA ABDOMEN AND PELVIS WITHOUT AND WITH CONTRAST TECHNIQUE: Multidetector CT imaging of the abdomen and pelvis was performed using the standard protocol during bolus  administration of intravenous contrast. Multiplanar reconstructed images and MIPs were obtained and reviewed to evaluate the vascular anatomy. CONTRAST:  157mL OMNIPAQUE IOHEXOL 350 MG/ML SOLN COMPARISON:  None. FINDINGS: VASCULAR Aorta: Normal caliber aorta without aneurysm, dissection, vasculitis or significant stenosis. Moderate atherosclerosis. Celiac: Patent without evidence of aneurysm, dissection, vasculitis or significant stenosis. SMA: Patent without evidence of aneurysm, dissection, vasculitis or significant stenosis. Renals: Single bilateral renal arteries. Both renal arteries are patent. There is plaque at the origin of the left renal artery  causing approximately 50% stenosis. No dissection, aneurysm, or vasculitis. IMA: Patent. Inflow: Patent without evidence of aneurysm, dissection, vasculitis or significant stenosis. Proximal Outflow: Bilateral common femoral and visualized portions of the superficial and profunda femoral arteries are patent without evidence of aneurysm, dissection, vasculitis or significant stenosis. Veins: Venous phase imaging demonstrates patent hepatic and portal veins. Mesenteric veins are patent. No portal venous or mesenteric gas. Iliac veins and IVC are unremarkable. Review of the MIP images confirms the above findings. NON-VASCULAR Lower chest: Large hiatal hernia. Adjacent compressive atelectasis in both lower lobes. There is also linear atelectasis or scarring in both dependent lower lobes. No pleural fluid. Heart size normal. Hepatobiliary: Tiny hypodensity in the right lobe of the liver, series 7, image 16, too small to characterize. Cholecystectomy without biliary dilatation. Pancreas: Fatty atrophy.  No ductal dilatation or inflammation. Spleen: Normal in size without focal abnormality. Adrenals/Urinary Tract: Normal adrenal glands. No hydronephrosis. Mild symmetric perinephric edema, typically chronic. Right renal cyst measures 15 mm, additional low-density bilateral renal cortical lesions are too small to characterize. Unremarkable urinary bladder. Stomach/Bowel: There is no contrast accumulation within the GI tract to localize site of GI bleed. There is high-density barium within scattered colonic diverticula that is also present on precontrast exam. Moderate diffuse colonic diverticulosis without diverticulitis or acute colonic inflammation. Colonic tortuosity with moderate volume of stool. There is fecalization of small bowel contents. No small bowel obstruction or inflammation. Large hiatal hernia with the majority of the stomach being intrathoracic. Lymphatic: No abdominopelvic adenopathy. Reproductive:  Brachytherapy seeds in the prostate gland. Other: Moderate-sized fat containing right inguinal hernia. Prior left inguinal hernia repair. There is residual fat in the left inguinal canal. No intra-abdominal free air or free fluid. No abscess. Musculoskeletal: There are no acute or suspicious osseous abnormalities. Degenerative change of both hips. Degenerative change in the spine with primarily facet hypertrophy. IMPRESSION: 1. No contrast accumulation within the GI tract to localize site of GI bleed. No evidence of bowel inflammation. 2. Moderate diffuse colonic diverticulosis without diverticulitis. 3. Large hiatal hernia with the majority of the stomach being intrathoracic. 4. Moderate fat containing right inguinal hernia. Prior left inguinal hernia repair. Aortic Atherosclerosis (ICD10-I70.0). Electronically Signed   By: Keith Rake M.D.   On: 05/03/2020 18:17     Labs:   Basic Metabolic Panel: Recent Labs  Lab 05/03/20 1630 05/04/20 0519  NA 138 139  K 4.3 3.6  CL 103 105  CO2 29 27  GLUCOSE 107* 103*  BUN 21 16  CREATININE 1.47* 0.98  CALCIUM 9.2 8.6*  MG  --  1.9  PHOS  --  3.1   GFR Estimated Creatinine Clearance: 55.1 mL/min (by C-G formula based on SCr of 0.98 mg/dL). Liver Function Tests: Recent Labs  Lab 05/03/20 1630 05/04/20 0519  AST 23 23  ALT 20 18  ALKPHOS 68 65  BILITOT 0.6 0.9  PROT 6.8 6.6  ALBUMIN 3.6 3.6   No results for input(s): LIPASE, AMYLASE  in the last 168 hours. No results for input(s): AMMONIA in the last 168 hours. Coagulation profile Recent Labs  Lab 05/03/20 1630  INR 1.0    CBC: Recent Labs  Lab 05/03/20 2211 05/04/20 0414 05/04/20 1040 05/04/20 1814 05/05/20 0435  WBC 7.5 8.4 8.1 7.7 7.3  NEUTROABS 2.3 3.3  --  2.9  --   HGB 13.3 13.3 13.6 13.0 12.6*  HCT 39.4 39.6 40.8 39.3 37.3*  MCV 101.3* 101.8* 101.5* 102.6* 101.6*  PLT 120* 115* 125* 118* 115*   Cardiac Enzymes: No results for input(s): CKTOTAL, CKMB,  CKMBINDEX, TROPONINI in the last 168 hours. BNP: Invalid input(s): POCBNP CBG: Recent Labs  Lab 05/04/20 1600 05/04/20 2002 05/05/20 0021 05/05/20 0437 05/05/20 0804  GLUCAP 101* 168* 120* 113* 115*   D-Dimer No results for input(s): DDIMER in the last 72 hours. Hgb A1c Recent Labs    05/04/20 0414  HGBA1C 6.0*   Lipid Profile No results for input(s): CHOL, HDL, LDLCALC, TRIG, CHOLHDL, LDLDIRECT in the last 72 hours. Thyroid function studies Recent Labs    05/04/20 0519  TSH 10.332*   Anemia work up Recent Labs    05/04/20 0519 05/04/20 1040  VITAMINB12  --  532  FOLATE 48.0  --    Microbiology Recent Results (from the past 240 hour(s))  SARS CORONAVIRUS 2 (TAT 6-24 HRS) Nasopharyngeal Nasopharyngeal Swab     Status: None   Collection Time: 05/03/20 11:35 PM   Specimen: Nasopharyngeal Swab  Result Value Ref Range Status   SARS Coronavirus 2 NEGATIVE NEGATIVE Final    Comment: (NOTE) SARS-CoV-2 target nucleic acids are NOT DETECTED.  The SARS-CoV-2 RNA is generally detectable in upper and lower respiratory specimens during the acute phase of infection. Negative results do not preclude SARS-CoV-2 infection, do not rule out co-infections with other pathogens, and should not be used as the sole basis for treatment or other patient management decisions. Negative results must be combined with clinical observations, patient history, and epidemiological information. The expected result is Negative.  Fact Sheet for Patients: SugarRoll.be  Fact Sheet for Healthcare Providers: https://www.woods-mathews.com/  This test is not yet approved or cleared by the [place] FDA and  has been authorized for detection and/or diagnosis of SARS-CoV-2 by FDA under an Emergency Use Authorization (EUA). This EUA will remain  in effect (meaning this test can be used) for the duration of the COVID-19 declaration under Se ction 564(b)(1)  of the Act, 21 U.S.C. section 360bbb-3(b)(1), unless the authorization is terminated or revoked sooner.  Performed at [facility], [place] [address], [place], [place] [address]      Discharge Instructions:   Discharge Instructions    Diet - low sodium heart healthy   Complete by: As directed    Increase activity slowly   Complete by: As directed      Allergies as of 05/05/2020      Reactions   Oxycodone-acetaminophen Other (See Comments)   Other reaction(s): Hallucinations Other reaction(s): Other (See Comments) Hallucination Hallucination Hallucination   Percocet [oxycodone-acetaminophen] Other (See Comments)   Hallucination   Voltaren [diclofenac Sodium] Palpitations      Medication List    STOP taking these medications   aspirin 81 MG tablet   benzonatate 200 MG capsule Commonly known as: TESSALON   DOCOSAHEXAENOIC ACID-EPA PO   donepezil 5 MG tablet Commonly known as: ARICEPT   doxycycline 100 MG capsule Commonly known as: VIBRAMYCIN   multivitamin with minerals tablet  TAKE these medications   acetaminophen 500 MG tablet Commonly known as: TYLENOL Take 1,000 mg by mouth every 8 (eight) hours as needed for moderate pain.   ascorbic acid 1000 MG tablet Commonly known as: VITAMIN C Take 1,000 mg by mouth daily.   b complex vitamins capsule Take 1 capsule by mouth daily.   Black Cherry Concentrate Liqd Take 15 mLs by mouth daily.   MISC NATURAL PRODUCTS PO Take by mouth. Beet juice capsules   calcium carbonate 1500 (600 Ca) MG Tabs tablet Commonly known as: OSCAL Take 1,500 mg by mouth daily with breakfast.   Cholecalciferol 25 MCG (1000 UT) tablet Take by mouth.   DULoxetine 20 MG capsule Commonly known as: CYMBALTA Take 20 mg by mouth daily.   famotidine 40 MG tablet Commonly known as: PEPCID Take 40 mg by mouth 2 (two) times daily.   ferrous sulfate 324 MG Tbec Take 324 mg by mouth 3 (three) times daily.   Fish Oil 1000  MG Caps Take 1 capsule by mouth daily.   Flax Seed Oil 1000 MG Caps Take 1 capsule by mouth daily.   gabapentin 100 MG capsule Commonly known as: NEURONTIN Take 100 mg by mouth 3 (three) times daily.   Garlic 9432 MG Caps Take 1 capsule by mouth every morning.   latanoprost 0.005 % ophthalmic solution Commonly known as: XALATAN Place 1 drop into both eyes at bedtime.   levothyroxine 50 MCG tablet Commonly known as: SYNTHROID Take 50 mcg by mouth daily. Take on an empty stomach with a glass of water at least 30-60 minutes before breakfast. What changed: Another medication with the same name was removed. Continue taking this medication, and follow the directions you see here.   loratadine 10 MG dissolvable tablet Commonly known as: CLARITIN REDITABS Take 10 mg by mouth daily. In am   primidone 50 MG tablet Commonly known as: MYSOLINE Take 1 tablet by mouth 2 (two) times daily.   propranolol 10 MG tablet Commonly known as: INDERAL Take 10 mg by mouth 2 (two) times daily.   ZINC GLUCONATE PO Take by mouth.         Time coordinating discharge: 32 minutes  Signed:  Francis  [facility] 05/05/2020, 10:35 AM   Pager on [email or web address]. If 7PM-7AM, please contact night-coverage at [email or web address]

## 2020-05-14 ENCOUNTER — Other Ambulatory Visit: Payer: Medicare Other

## 2020-05-17 ENCOUNTER — Ambulatory Visit: Payer: Medicare Other | Admitting: Oncology

## 2020-05-21 ENCOUNTER — Telehealth: Payer: Self-pay | Admitting: Gastroenterology

## 2020-05-21 NOTE — Telephone Encounter (Signed)
Patient does not want an appt in our clinic for ED f/u. Patient is established with [facility].

## 2020-05-21 NOTE — Telephone Encounter (Signed)
LVM on home phone to call us back to schedule 1-2 week f/u with Dr T.  Unable to LVM on mobile due to full mailbox

## 2020-05-24 ENCOUNTER — Telehealth: Payer: Self-pay | Admitting: Gastroenterology

## 2020-05-24 NOTE — Telephone Encounter (Signed)
error 

## 2020-05-28 ENCOUNTER — Ambulatory Visit (INDEPENDENT_AMBULATORY_CARE_PROVIDER_SITE_OTHER): Payer: Medicare Other | Admitting: Urology

## 2020-05-28 ENCOUNTER — Other Ambulatory Visit: Payer: Self-pay

## 2020-05-28 ENCOUNTER — Encounter: Payer: Self-pay | Admitting: Urology

## 2020-05-28 VITALS — BP 148/75 | HR 52 | Ht 69.0 in | Wt 172.0 lb

## 2020-05-28 DIAGNOSIS — N50819 Testicular pain, unspecified: Secondary | ICD-10-CM

## 2020-05-28 MED ORDER — SULFAMETHOXAZOLE-TRIMETHOPRIM 800-160 MG PO TABS: 1.0000 | ORAL_TABLET | Freq: Two times a day (BID) | ORAL | 0 refills | Status: DC

## 2020-05-28 NOTE — Progress Notes (Signed)
05/28/2020 1:49 PM   Allen Chang [birth year] [id number]  Referring provider: Tracie Harrier, MD [address] [facility] [place],  [place] [address]  Chief Complaint  Patient presents with  . Testicle Pain    HPI: 87 year old male who presents today for acute evaluation of right testicular pain.  He was sent over directly from Dr. Etta Quill office.  He reports that about a week ago, he began experiencing right testicular pain.  He describes the pain is constant but has flares of exacerbation.  Last night, the pain was unbearable such that he laid in bed crying and try not to wake up his wife.  He also has severe pain radiating down his right leg and he feels a knot on his anterior thigh.  He denies any difficulty urinating.  He denies any dysuria.  He denies any gross hematuria.  He denies any perineal pain.  He denies any inguinal pain.  No nausea or vomiting.  He reports that he saw Dr. Yves Dill late last week who did an exam which was unremarkable.  He was not prescribed any treatment.  No recent scrotal imaging.  He does have a CT of the abdomen pelvis from 04/2020 which shows a mild fat-containing right inguinal hernia.   PMH: Past Medical History:  Diagnosis Date  . Cancer [facility])    prostate   . CKD (chronic kidney disease) stage 3, GFR 30-59 ml/min (HCC) 10/01/2016  . Complication of anesthesia    bradycardia, in ICU for 24 hour after galbladder surgery.   . Diverticulosis 2012  . Dysrhythmia    Heart skips a beat  . Elevated lipids   . GERD (gastroesophageal reflux disease)   . History of hiatal hernia   . Hypertension   . Hypothyroidism   . Leukemia ([place])   . Prostate cancer ([place])   . Skin cancer    face, scalp, behind ear,back and hand  . Tremors of nervous system     Surgical History: Past Surgical History:  Procedure Laterality Date  . BLADDER TUMOR EXCISION    . CARDIAC CATHETERIZATION    . CHOLECYSTECTOMY N/A 10/10/2014    Procedure: LAPAROSCOPIC CHOLECYSTECTOMY WITH INTRAOPERATIVE CHOLANGIOGRAM;  Surgeon: Leonie Green, MD;  Location: [facility] ORS;  Service: General;  Laterality: N/A;  . COLONOSCOPY WITH PROPOFOL N/A 02/16/2017   Procedure: COLONOSCOPY WITH PROPOFOL;  Surgeon: Manya Silvas, MD;  Location: [facility] ENDOSCOPY;  Service: Endoscopy;  Laterality: N/A;  . ESOPHAGOGASTRODUODENOSCOPY (EGD) WITH PROPOFOL N/A 02/16/2017   Procedure: ESOPHAGOGASTRODUODENOSCOPY (EGD) WITH PROPOFOL;  Surgeon: Manya Silvas, MD;  Location: [facility] ENDOSCOPY;  Service: Endoscopy;  Laterality: N/A;  . HEMORRHOID SURGERY    . HERNIA REPAIR Left    x2  . JOINT REPLACEMENT Left    Partial Knee Replacement, Dr. Roland Rack  . PARTIAL KNEE ARTHROPLASTY Left 12/12/2014   Procedure: UNICOMPARTMENTAL KNEE;  Surgeon: Corky Mull, MD;  Location: [facility] ORS;  Service: Orthopedics;  Laterality: Left;  . prostate seeding    . SHOULDER ARTHROSCOPY Right   . SHOULDER ARTHROSCOPY WITH OPEN ROTATOR CUFF REPAIR Left 02/07/2016   Procedure: SHOULDER ARTHROSCOPY WITH OPEN ROTATOR CUFF REPAIR;  Surgeon: Corky Mull, MD;  Location: [facility] ORS;  Service: Orthopedics;  Laterality: Left;    Home Medications:  Allergies as of 05/28/2020      Reactions   Oxycodone-acetaminophen Other (See Comments)   Other reaction(s): Hallucinations Other reaction(s): Other (See Comments) Hallucination Hallucination Hallucination   Percocet [oxycodone-acetaminophen] Other (See Comments)  Hallucination   Voltaren [diclofenac Sodium] Palpitations      Medication List       Accurate as of May 28, 2020  1:49 PM. If you have any questions, ask your nurse or doctor.        acetaminophen 500 MG tablet Commonly known as: TYLENOL Take 1,000 mg by mouth every 8 (eight) hours as needed for moderate pain.   ascorbic acid 1000 MG tablet Commonly known as: VITAMIN C Take 1,000 mg by mouth daily.   b complex vitamins capsule Take 1 capsule by mouth daily.    Black Cherry Concentrate Liqd Take 15 mLs by mouth daily.   MISC NATURAL PRODUCTS PO Take by mouth. Beet juice capsules   calcium carbonate 1500 (600 Ca) MG Tabs tablet Commonly known as: OSCAL Take 1,500 mg by mouth daily with breakfast.   Cholecalciferol 25 MCG (1000 UT) tablet Take by mouth.   donepezil 10 MG tablet Commonly known as: ARICEPT Take 10 mg by mouth daily.   DULoxetine 20 MG capsule Commonly known as: CYMBALTA Take 20 mg by mouth daily.   famotidine 40 MG tablet Commonly known as: PEPCID Take 40 mg by mouth 2 (two) times daily.   ferrous sulfate 324 MG Tbec Take 324 mg by mouth 3 (three) times daily.   Fish Oil 1000 MG Caps Take 1 capsule by mouth daily.   Flax Seed Oil 1000 MG Caps Take 1 capsule by mouth daily.   gabapentin 100 MG capsule Commonly known as: NEURONTIN Take 100 mg by mouth 3 (three) times daily.   Garlic 2683 MG Caps Take 1 capsule by mouth every morning.   latanoprost 0.005 % ophthalmic solution Commonly known as: XALATAN Place 1 drop into both eyes at bedtime.   levothyroxine 50 MCG tablet Commonly known as: SYNTHROID Take 50 mcg by mouth daily. Take on an empty stomach with a glass of water at least 30-60 minutes before breakfast.   loratadine 10 MG dissolvable tablet Commonly known as: CLARITIN REDITABS Take 10 mg by mouth daily. In am   primidone 50 MG tablet Commonly known as: MYSOLINE Take 1 tablet by mouth 2 (two) times daily.   propranolol 10 MG tablet Commonly known as: INDERAL Take 10 mg by mouth 2 (two) times daily.   sulfamethoxazole-trimethoprim 800-160 MG tablet Commonly known as: BACTRIM DS Take 1 tablet by mouth 2 (two) times daily. Started by: Hollice Espy, MD   ZINC GLUCONATE PO Take by mouth.       Allergies:  Allergies  Allergen Reactions  . Oxycodone-Acetaminophen Other (See Comments)    Other reaction(s): Hallucinations Other reaction(s): Other (See  Comments) Hallucination Hallucination Hallucination  . Percocet [Oxycodone-Acetaminophen] Other (See Comments)    Hallucination   . Voltaren [Diclofenac Sodium] Palpitations    Family History: Family History  Problem Relation Age of Onset  . Bone cancer Father   . Hypertension Mother   . Osteoporosis Mother   . Breast cancer Sister   . Cancer Brother   . Cancer Brother   . Lung cancer Brother   . Bladder Cancer Brother   . Melanoma Brother     Social History:  reports that he has never smoked. He has never used smokeless tobacco. He reports that he does not drink alcohol and does not use drugs.   Physical Exam: BP (!) 148/75   Pulse (!) 52   Ht 5\' 9"  (1.753 m)   Wt 172 lb (78 kg)   BMI 25.40 kg/m  Constitutional:  Alert and oriented, No acute distress.  Accompanied by wife today.   HEENT: [place] AT, moist mucus membranes.  Trachea midline, no masses. Cardiovascular: No clubbing, cyanosis, or edema. Respiratory: Normal respiratory effort, no increased work of breathing. GU: Uncircumcised phallus with easily retractable foreskin without urethral lesions or redness.  Scrotum is unremarkable with bilateral descended testicles which have no masses and are nontender.  There is very subtle tenderness of his right epididymis which is not enlarged. Skin: No rashes, bruises or suspicious lesions. Neurologic: Grossly intact, no focal deficits, moving all 4 extremities. Psychiatric: Normal mood and affect.  Laboratory Data: Lab Results  Component Value Date   WBC 7.3 05/05/2020   HGB 12.6 (L) 05/05/2020   HCT 37.3 (L) 05/05/2020   MCV 101.6 (H) 05/05/2020   PLT 115 (L) 05/05/2020    Lab Results  Component Value Date   CREATININE 0.98 05/04/2020   Urinalysis Urinalysis, no microscopic blood or infection  Pertinent Imaging: N/A   Assessment & Plan:    1. Pain in testicle, right Exam today was extremely benign without obvious inguinal hernia, or scrotal pathology.  He  did have very mild tenderness of the right epididymis we will treat him for presumed epididymitis although very low suspicion for this.  Bactrim DS twice daily for 7 days.  Given that his pain radiates down his leg, I suspect he may have some referred hip pain or other MSK pain contributing to his scrotal pain and suspected scrotal pain may be referred.  He does have a known right inguinal hernia which was fat-containing on previous imaging however this was nonpalpable today and thus unlikely to be contributing factor.  No indication for additional GU imaging at this time.  We discussed scrotal support today as well.  Recommend following up with his PCP. - Urinalysis, Complete   Return if symptoms worsen or fail to improve.  Hollice Espy, MD  [facility] [address], [place], [place] [address] [phone]

## 2020-05-29 LAB — URINALYSIS, COMPLETE
Bilirubin, UA: NEGATIVE
Glucose, UA: NEGATIVE
Ketones, UA: NEGATIVE
Leukocytes,UA: NEGATIVE
Nitrite, UA: NEGATIVE
Protein,UA: NEGATIVE
RBC, UA: NEGATIVE
Specific Gravity, UA: 1.01 (ref 1.005–1.030)
Urobilinogen, Ur: 0.2 mg/dL (ref 0.2–1.0)
pH, UA: 5 (ref 5.0–7.5)

## 2020-05-29 LAB — MICROSCOPIC EXAMINATION
Bacteria, UA: NONE SEEN
Epithelial Cells (non renal): NONE SEEN /hpf (ref 0–10)
WBC, UA: NONE SEEN /hpf (ref 0–5)

## 2020-07-06 ENCOUNTER — Inpatient Hospital Stay: Payer: Medicare Other | Attending: Oncology

## 2020-07-06 ENCOUNTER — Other Ambulatory Visit: Payer: Self-pay

## 2020-07-06 DIAGNOSIS — N1831 Chronic kidney disease, stage 3a: Secondary | ICD-10-CM | POA: Diagnosis not present

## 2020-07-06 DIAGNOSIS — D696 Thrombocytopenia, unspecified: Secondary | ICD-10-CM | POA: Insufficient documentation

## 2020-07-06 DIAGNOSIS — D509 Iron deficiency anemia, unspecified: Secondary | ICD-10-CM | POA: Diagnosis not present

## 2020-07-06 DIAGNOSIS — D472 Monoclonal gammopathy: Secondary | ICD-10-CM | POA: Diagnosis not present

## 2020-07-06 DIAGNOSIS — C9110 Chronic lymphocytic leukemia of B-cell type not having achieved remission: Secondary | ICD-10-CM | POA: Insufficient documentation

## 2020-07-06 DIAGNOSIS — Z8546 Personal history of malignant neoplasm of prostate: Secondary | ICD-10-CM | POA: Diagnosis not present

## 2020-07-06 LAB — CBC WITH DIFFERENTIAL/PLATELET
Abs Immature Granulocytes: 0.02 10*3/uL (ref 0.00–0.07)
Basophils Absolute: 0.1 10*3/uL (ref 0.0–0.1)
Basophils Relative: 1 %
Eosinophils Absolute: 0.5 10*3/uL (ref 0.0–0.5)
Eosinophils Relative: 6 %
HCT: 38.4 % — ABNORMAL LOW (ref 39.0–52.0)
Hemoglobin: 13.1 g/dL (ref 13.0–17.0)
Immature Granulocytes: 0 %
Lymphocytes Relative: 49 %
Lymphs Abs: 3.8 10*3/uL (ref 0.7–4.0)
MCH: 35.8 pg — ABNORMAL HIGH (ref 26.0–34.0)
MCHC: 34.1 g/dL (ref 30.0–36.0)
MCV: 104.9 fL — ABNORMAL HIGH (ref 80.0–100.0)
Monocytes Absolute: 0.9 10*3/uL (ref 0.1–1.0)
Monocytes Relative: 11 %
Neutro Abs: 2.5 10*3/uL (ref 1.7–7.7)
Neutrophils Relative %: 33 %
Platelets: 130 10*3/uL — ABNORMAL LOW (ref 150–400)
RBC: 3.66 MIL/uL — ABNORMAL LOW (ref 4.22–5.81)
RDW: 12.9 % (ref 11.5–15.5)
Smear Review: NORMAL
WBC: 7.8 10*3/uL (ref 4.0–10.5)
nRBC: 0 % (ref 0.0–0.2)

## 2020-07-06 LAB — COMPREHENSIVE METABOLIC PANEL
ALT: 16 U/L (ref 0–44)
AST: 21 U/L (ref 15–41)
Albumin: 3.8 g/dL (ref 3.5–5.0)
Alkaline Phosphatase: 68 U/L (ref 38–126)
Anion gap: 9 (ref 5–15)
BUN: 18 mg/dL (ref 8–23)
CO2: 28 mmol/L (ref 22–32)
Calcium: 9.3 mg/dL (ref 8.9–10.3)
Chloride: 103 mmol/L (ref 98–111)
Creatinine, Ser: 1.21 mg/dL (ref 0.61–1.24)
GFR, Estimated: 58 mL/min — ABNORMAL LOW (ref >60)
Glucose, Bld: 104 mg/dL — ABNORMAL HIGH (ref 70–99)
Potassium: 4 mmol/L (ref 3.5–5.1)
Sodium: 140 mmol/L (ref 135–145)
Total Bilirubin: 0.7 mg/dL (ref 0.3–1.2)
Total Protein: 7 g/dL (ref 6.5–8.1)

## 2020-07-06 LAB — LACTATE DEHYDROGENASE: LDH: 115 U/L (ref 98–192)

## 2020-07-09 LAB — KAPPA/LAMBDA LIGHT CHAINS
Kappa free light chain: 37 mg/L — ABNORMAL HIGH (ref 3.3–19.4)
Kappa, lambda light chain ratio: 0.62 (ref 0.26–1.65)
Lambda free light chains: 59.8 mg/L — ABNORMAL HIGH (ref 5.7–26.3)

## 2020-07-10 LAB — MULTIPLE MYELOMA PANEL, SERUM
Albumin SerPl Elph-Mcnc: 3.6 g/dL (ref 2.9–4.4)
Albumin/Glob SerPl: 1.3 (ref 0.7–1.7)
Alpha 1: 0.2 g/dL (ref 0.0–0.4)
Alpha2 Glob SerPl Elph-Mcnc: 0.7 g/dL (ref 0.4–1.0)
B-Globulin SerPl Elph-Mcnc: 0.7 g/dL (ref 0.7–1.3)
Gamma Glob SerPl Elph-Mcnc: 1.4 g/dL (ref 0.4–1.8)
Globulin, Total: 3 g/dL (ref 2.2–3.9)
IgA: 46 mg/dL — ABNORMAL LOW (ref 61–437)
IgG (Immunoglobin G), Serum: 1400 mg/dL (ref 603–1613)
IgM (Immunoglobulin M), Srm: 109 mg/dL (ref 15–143)
M Protein SerPl Elph-Mcnc: 0.9 g/dL — ABNORMAL HIGH
Total Protein ELP: 6.6 g/dL (ref 6.0–8.5)

## 2020-07-10 LAB — COMP PANEL: LEUKEMIA/LYMPHOMA: Immunophenotypic Profile: 25

## 2020-07-13 ENCOUNTER — Inpatient Hospital Stay (HOSPITAL_BASED_OUTPATIENT_CLINIC_OR_DEPARTMENT_OTHER): Payer: Medicare Other | Admitting: Oncology

## 2020-07-13 ENCOUNTER — Encounter: Payer: Self-pay | Admitting: Oncology

## 2020-07-13 VITALS — BP 130/70 | HR 52 | Temp 98.2°F | Resp 18 | Wt 170.5 lb

## 2020-07-13 DIAGNOSIS — N1831 Chronic kidney disease, stage 3a: Secondary | ICD-10-CM | POA: Diagnosis not present

## 2020-07-13 DIAGNOSIS — D696 Thrombocytopenia, unspecified: Secondary | ICD-10-CM

## 2020-07-13 DIAGNOSIS — C9110 Chronic lymphocytic leukemia of B-cell type not having achieved remission: Secondary | ICD-10-CM | POA: Diagnosis not present

## 2020-07-13 DIAGNOSIS — D472 Monoclonal gammopathy: Secondary | ICD-10-CM | POA: Diagnosis not present

## 2020-07-13 NOTE — Progress Notes (Signed)
Pt here for follow up. No new concerns voiced.   

## 2020-07-13 NOTE — Progress Notes (Signed)
Hematology/Oncology Follow Up Note [facility] [phone] [phone]  CONSULT NOTE Patient Care Team: Tracie Harrier, MD as PCP - General (Internal Medicine)  REASON FOR VISIT  follow up for CLL and iron deficiency anemia  HISTORY OF PRESENTING ILLNESS:  Allen Chang 87 y.o.  male with PMH listed as below who was referred by primary care provider to me for evaluation of leukocytosis. He has a history of prostate cancer which was diagnosed 10 years ago. He underwent brachytherapy. He is not any castration treatment. Per patient, he follows up with Dr.Wolf and most recent PSA is normal.   Patient also reports feeling fatigue lately. Recent labs show leukocytosis, mild anemia, macrocytosis, mild thrombocytopenia, low ferritin. Denies blood in the stool. He was started on over the counter iron supplement but wife who manages patient's medication decides to only let patient to take iron medication every other day.   # received IV iron Venofer x 4. Colonoscopy was done on 02/16/2017 which showed polyps, internal hemorrhoids, negative for malignancy.  ##Patient had bone marrow biopsy on 09/22/2018 Pathology showed hypercellular bone marrow with extensive involvement by chronic lymphocytic leukemia. # Started on acalabrutinib 100 mg twice daily on 10/01/2018. 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  #August 2020-acalabrutinib 100 mg twice daily Stopped in October 2021 during his admission.  # admitted from 12/16/19- 10/30/ 2021 due to sudden onset chest pain. Troponins were negative in the emergency room.  Initial EKG showed atrial fibrillation with heart rate of 55. Patient was seen by cardiology and clarified that the EKG and telemetry actually showed sinus rhythm with PACs patient did not require any anticoagulation.Echocardiogram showed normal EF and no wall motion abnormality.  Nuclear stress testing showed low risk study,  EF 63%. Patient was discharged home and followed up with Dr. Clayborn Bigness on January 26, 2020 Currently off of acalabrutinib since October 2021 admission.  INTERVAL HISTORY Allen Chang is a 87 y.o. male who has above history reviewed by me presents for 4 months follow up for CLL and iron deficiency anemia, MGUS Today patient was accompanied by his wife Right knee osteoarthritis.  He follows up with Dr. Roland Rack and was recently seen on 07/11/2020.  Notes were not available to me via Care Everywhere.   Review of Systems  Constitutional: Negative for appetite change, chills, fatigue, fever and unexpected weight change.  HENT:   Negative for hearing loss and voice change.   Eyes: Negative for eye problems and icterus.  Respiratory: Negative for chest tightness, cough and shortness of breath.   Cardiovascular: Negative for chest pain and leg swelling.  Gastrointestinal: Negative for abdominal distention and abdominal pain.  Endocrine: Negative for hot flashes.  Genitourinary: Negative for difficulty urinating, dysuria and frequency.   Musculoskeletal: Positive for arthralgias. Negative for back pain.  Skin: Negative for itching and rash.       Skin cancer  Neurological: Negative for light-headedness and numbness.  Hematological: Negative for adenopathy. Does not bruise/bleed easily.  Psychiatric/Behavioral: Negative for confusion.    MEDICAL HISTORY:  Past Medical History:  Diagnosis Date  . Cancer [facility])    prostate   . CKD (chronic kidney disease) stage 3, GFR 30-59 ml/min (HCC) 10/01/2016  . Complication of anesthesia    bradycardia, in ICU for 24 hour after galbladder surgery.   . Diverticulosis 2012  . Dysrhythmia    Heart skips a beat  . Elevated lipids   . GERD (gastroesophageal reflux disease)   .  History of hiatal hernia   . Hypertension   . Hypothyroidism   . Leukemia ([place])   . Prostate cancer ([place])   . Skin cancer    face, scalp, behind ear,back and hand  . Tremors of  nervous system     SURGICAL HISTORY: Past Surgical History:  Procedure Laterality Date  . BLADDER TUMOR EXCISION    . CARDIAC CATHETERIZATION    . CHOLECYSTECTOMY N/A 10/10/2014   Procedure: LAPAROSCOPIC CHOLECYSTECTOMY WITH INTRAOPERATIVE CHOLANGIOGRAM;  Surgeon: Leonie Green, MD;  Location: [facility] ORS;  Service: General;  Laterality: N/A;  . COLONOSCOPY WITH PROPOFOL N/A 02/16/2017   Procedure: COLONOSCOPY WITH PROPOFOL;  Surgeon: Manya Silvas, MD;  Location: [facility] ENDOSCOPY;  Service: Endoscopy;  Laterality: N/A;  . ESOPHAGOGASTRODUODENOSCOPY (EGD) WITH PROPOFOL N/A 02/16/2017   Procedure: ESOPHAGOGASTRODUODENOSCOPY (EGD) WITH PROPOFOL;  Surgeon: Manya Silvas, MD;  Location: [facility] ENDOSCOPY;  Service: Endoscopy;  Laterality: N/A;  . HEMORRHOID SURGERY    . HERNIA REPAIR Left    x2  . JOINT REPLACEMENT Left    Partial Knee Replacement, Dr. Roland Rack  . PARTIAL KNEE ARTHROPLASTY Left 12/12/2014   Procedure: UNICOMPARTMENTAL KNEE;  Surgeon: Corky Mull, MD;  Location: [facility] ORS;  Service: Orthopedics;  Laterality: Left;  . prostate seeding    . SHOULDER ARTHROSCOPY Right   . SHOULDER ARTHROSCOPY WITH OPEN ROTATOR CUFF REPAIR Left 02/07/2016   Procedure: SHOULDER ARTHROSCOPY WITH OPEN ROTATOR CUFF REPAIR;  Surgeon: Corky Mull, MD;  Location: [facility] ORS;  Service: Orthopedics;  Laterality: Left;    SOCIAL HISTORY: Social History   Socioeconomic History  . Marital status: Married    Spouse name: Not on file  . Number of children: Not on file  . Years of education: Not on file  . Highest education level: Not on file  Occupational History  . Not on file  Tobacco Use  . Smoking status: Never Smoker  . Smokeless tobacco: Never Used  Vaping Use  . Vaping Use: Never used  Substance and Sexual Activity  . Alcohol use: No  . Drug use: No  . Sexual activity: Yes  Other Topics Concern  . Not on file  Social History Narrative  . Not on file   Social Determinants of  Health   Financial Resource Strain: Not on file  Food Insecurity: Not on file  Transportation Needs: Not on file  Physical Activity: Not on file  Stress: Not on file  Social Connections: Not on file  Intimate Partner Violence: Not on file    FAMILY HISTORY: Family History  Problem Relation Age of Onset  . Bone cancer Father   . Hypertension Mother   . Osteoporosis Mother   . Breast cancer Sister   . Cancer Brother   . Cancer Brother   . Lung cancer Brother   . Bladder Cancer Brother   . Melanoma Brother     ALLERGIES:  is allergic to oxycodone-acetaminophen, percocet [oxycodone-acetaminophen], and voltaren [diclofenac sodium].  MEDICATIONS:  Current Outpatient Medications  Medication Sig Dispense Refill  . acetaminophen (TYLENOL) 500 MG tablet Take 1,000 mg by mouth every 8 (eight) hours as needed for moderate pain.    Marland Kitchen ascorbic acid (VITAMIN C) 1000 MG tablet Take 1,000 mg by mouth daily.    Marland Kitchen b complex vitamins capsule Take 1 capsule by mouth daily.    . calcium carbonate (OSCAL) 1500 (600 Ca) MG TABS tablet Take 1,500 mg by mouth daily with breakfast.    . Cholecalciferol 25  MCG (1000 UT) tablet Take by mouth.    . donepezil (ARICEPT) 10 MG tablet Take 10 mg by mouth daily.    . DULoxetine (CYMBALTA) 20 MG capsule Take 20 mg by mouth daily.    . famotidine (PEPCID) 40 MG tablet Take 40 mg by mouth 2 (two) times daily.    . ferrous sulfate 324 MG TBEC Take 324 mg by mouth 3 (three) times daily.     . Flaxseed, Linseed, (FLAX SEED OIL) 1000 MG CAPS Take 1 capsule by mouth daily.    Marland Kitchen gabapentin (NEURONTIN) 100 MG capsule Take 100 mg by mouth 3 (three) times daily.     . Garlic 2951 MG CAPS Take 1 capsule by mouth every morning.    . latanoprost (XALATAN) 0.005 % ophthalmic solution Place 1 drop into both eyes at bedtime.    Marland Kitchen levothyroxine (SYNTHROID) 50 MCG tablet Take 50 mcg by mouth daily. Take on an empty stomach with a glass of water at least 30-60 minutes before  breakfast.    . loratadine (CLARITIN REDITABS) 10 MG dissolvable tablet Take 10 mg by mouth daily. In am    . Misc Natural Products (BLACK CHERRY CONCENTRATE) LIQD Take 15 mLs by mouth daily.     Marland Kitchen MISC NATURAL PRODUCTS PO Take by mouth. Beet juice capsules    . Omega-3 Fatty Acids (FISH OIL) 1000 MG CAPS Take 1 capsule by mouth daily.    . primidone (MYSOLINE) 50 MG tablet Take 1 tablet by mouth 2 (two) times daily.    . propranolol (INDERAL) 10 MG tablet Take 10 mg by mouth 2 (two) times daily.    Marland Kitchen ZINC GLUCONATE PO Take by mouth.     No current facility-administered medications for this visit.      Marland Kitchen  PHYSICAL EXAMINATION: ECOG PERFORMANCE STATUS: 1 - Symptomatic but completely ambulatory Vitals:   07/13/20 1006  BP: 130/70  Pulse: (!) 52  Resp: 18  Temp: 98.2 F (36.8 C)   Filed Weights   07/13/20 1006  Weight: 170 lb 8 oz (77.3 kg)   Physical Exam Constitutional:      General: He is not in acute distress.    Appearance: He is not diaphoretic.     Comments: Patient walks with a cane  HENT:     Head: Normocephalic and atraumatic.     Nose: Nose normal.     Mouth/Throat:     Pharynx: No oropharyngeal exudate.  Eyes:     General: No scleral icterus.    Pupils: Pupils are equal, round, and reactive to light.  Cardiovascular:     Rate and Rhythm: Normal rate. Rhythm irregular.     Heart sounds: No murmur heard.   Pulmonary:     Effort: Pulmonary effort is normal. No respiratory distress.     Breath sounds: No rales.  Chest:     Chest wall: No tenderness.  Abdominal:     General: There is no distension.     Palpations: Abdomen is soft.     Tenderness: There is no abdominal tenderness.  Musculoskeletal:        General: Normal range of motion.     Cervical back: Normal range of motion and neck supple.  Skin:    General: Skin is warm and dry.     Findings: No erythema.  Neurological:     Mental Status: He is alert and oriented to person, place, and time.   Psychiatric:  Mood and Affect: Affect normal.   .    LABORATORY DATA:  I have reviewed the data as listed Lab Results  Component Value Date   WBC 7.8 07/06/2020   HGB 13.1 07/06/2020   HCT 38.4 (L) 07/06/2020   MCV 104.9 (H) 07/06/2020   PLT 130 (L) 07/06/2020   Recent Labs    09/06/19 0954 11/10/19 1021 12/15/19 2006 05/03/20 1630 05/04/20 0519 07/06/20 1111  NA 141 139   < > 138 139 140  K 3.9 4.4   < > 4.3 3.6 4.0  CL 106 102   < > 103 105 103  CO2 28 29   < > '29 27 28  ' GLUCOSE 108* 122*   < > 107* 103* 104*  BUN 30* 26*   < > '21 16 18  ' CREATININE 1.40* 1.39*   < > 1.47* 0.98 1.21  CALCIUM 9.3 9.2   < > 9.2 8.6* 9.3  GFRNONAA 45* 46*   < > 46* >60 58*  GFRAA 53* 53*  --   --   --   --   PROT 7.0 7.0   < > 6.8 6.6 7.0  ALBUMIN 3.9 3.5   < > 3.6 3.6 3.8  AST 21 17   < > '23 23 21  ' ALT 17 15   < > '20 18 16  ' ALKPHOS 60 63   < > 68 65 68  BILITOT 0.9 0.8   < > 0.6 0.9 0.7   < > = values in this interval not displayed.    RADIOGRAPHIC STUDIES: I have personally reviewed the radiological images as listed and agreed with the findings in the report. no recent images. 11/04/2017  US abdomen Stable right renal cyst. Status post cholecystectomy.Previously seen lesion within the liver is not well appreciated on this exam. No Splenomegaly.   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  Cytogenetics revealed Trisomy 12 IGVH unmutated.  ASSESSMENT & PLAN:  1. CLL (chronic lymphocytic leukemia) ([place])   2. MGUS (monoclonal gammopathy of unknown significance)   3. Thrombocytopenia ([place])   4. Stage 3a chronic kidney disease ([place])    Per patient's request, I also called patient's daughter Anderson Malta and updated her. # CLL, intermediate risk with trisomy 12. IGVH unmutated. Stage III, extensive bone marrow involvement. Off acalabrutinib 100 mg twice daily Flow cytometry showed CLL population less than 5000 cells/ul. Continue watchful waiting.  He is  asymptomatic.   Thrombocytopenia mild at 1 30,000.  MGUS, IgG lambda and IgM kappa clones. 02/14/2020 M protein increased from 2.3 to1.1. 04/03/2020, M protein 1.0. 07/06/2020 M protein 0.9, IgG lambda and new IgM kappa clone I discussed with patient about the diagnosis of IgG MGUS which is an asymptomatic condition which has a small risk of progression to smoldering multiple myeloma and to symptomatic multiple myeloma. Less frequently, these patients progress to AL amyloidosis, light chain deposition disease, or another lymphoproliferative disorder. Patient does seem to have a rapid increase of M protein within the past 1 year.  Close monitoring.  Repeat blood work in 3 months.  Observation for now.  #CKD, creatinine is stable. avoid nephrotoxins.  Orders Placed This Encounter  Procedures  . CBC with Differential/Platelet    Standing Status:   Future    Standing Expiration Date:   07/13/2021  . Comprehensive metabolic panel    Standing Status:   Future    Standing Expiration Date:   07/13/2021  . Kappa/lambda light chains  Standing Status:   Future    Standing Expiration Date:   07/13/2021  . Multiple Myeloma Panel (SPEP&IFE w/QIG)    Standing Status:   Future    Standing Expiration Date:   07/13/2021  . Lactate dehydrogenase    Standing Status:   Future    Standing Expiration Date:   07/13/2021  . Flow cytometry panel-leukemia/lymphoma work-up    Standing Status:   Future    Standing Expiration Date:   07/13/2021   Follow up in 3 months  [name], MD, PhD Hematology Oncology [place] [facility] 07/13/2020

## 2020-07-16 LAB — FISH HES LEUKEMIA, 4Q12 REA
Cells Analyzed: 100
Cells Counted:: 100

## 2020-09-04 ENCOUNTER — Other Ambulatory Visit: Payer: Self-pay | Admitting: Surgery

## 2020-09-18 ENCOUNTER — Encounter
Admission: RE | Admit: 2020-09-18 | Discharge: 2020-09-18 | Disposition: A | Discharge status: Home / Self Care | Payer: Medicare Other | Source: Ambulatory Visit | Attending: Surgery | Admitting: Surgery

## 2020-09-18 ENCOUNTER — Other Ambulatory Visit: Payer: Self-pay

## 2020-09-18 DIAGNOSIS — Z01818 Encounter for other preprocedural examination: Secondary | ICD-10-CM | POA: Diagnosis present

## 2020-09-18 LAB — URINALYSIS, ROUTINE W REFLEX MICROSCOPIC
Bilirubin Urine: NEGATIVE
Glucose, UA: NEGATIVE mg/dL
Hgb urine dipstick: NEGATIVE
Ketones, ur: NEGATIVE mg/dL
Leukocytes,Ua: NEGATIVE
Nitrite: NEGATIVE
Protein, ur: NEGATIVE mg/dL
Specific Gravity, Urine: 1.02 (ref 1.005–1.030)
pH: 5 (ref 5.0–8.0)

## 2020-09-18 LAB — TYPE AND SCREEN
ABO/RH(D): A POS
Antibody Screen: NEGATIVE

## 2020-09-18 LAB — SURGICAL PCR SCREEN
MRSA, PCR: NEGATIVE
Staphylococcus aureus: NEGATIVE

## 2020-09-18 NOTE — Patient Instructions (Addendum)
Your procedure is scheduled on: Tues 8/9 Report to to Registration in [place] then Day Surgery on second floor To find out your arrival time please call [phone] between 1PM - 3PM on Mon 8/8.  Remember: Instructions that are not followed completely may result in serious medical risk,  up to and including death, or upon the discretion of your surgeon and anesthesiologist your  surgery may need to be rescheduled.     _X__ 1. Do not eat food after midnight the night before your procedure.                 No chewing gum or hard candies. You may drink clear liquids up to 2 hours                 before you are scheduled to arrive for your surgery- DO not drink clear                 liquids within 2 hours of the start of your surgery.                 Clear Liquids include:  water, apple juice without pulp, clear Gatorade, G2 or                  Gatorade Zero (avoid Red/Purple/Blue), Black Coffee or Tea (Do not add                 anything to coffee or tea). __x___2.   Complete the "Ensure Clear Pre-surgery Clear Carbohydrate Drink"       provided to you, 2 hours before arrival. **If you are diabetic you will be       provided with an alternative drink, Gatorade Zero or G2.  __X__2.  On the morning of surgery brush your teeth with toothpaste and water, you                may rinse your mouth with mouthwash if you wish.  Do not swallow any         toothpaste of mouthwash.     __ 3.  No Alcohol for 24 hours before or after surgery.   __ 4.  Do Not Smoke or use e-cigarettes For 24 Hours Prior to Your Surgery.                 Do not use any chewable tobacco products for at least 6 hours prior to                 Surgery.  ___  5.  Do not use any recreational drugs (marijuana, cocaine, heroin, ecstasy,       MDMA or other) For at least one week prior to your surgery.            Combination of these drugs with anesthesia may have life threatening       results.  ____   6.  Bring all medications with you on the day of surgery if instructed.   __x__  7.  Notify your doctor if there is any change in your medical condition      (cold, fever, infections).     Do not wear jewelry,  Do not wear lotions,  You may wear deodorant. Do not shave 48 hours prior to surgery. Men may shave face and neck. Do not bring valuables to the hospital.    [facility] is not responsible for any belongings or valuables.  Contacts, dentures or bridgework  may not be worn into surgery. Leave your suitcase in the car. After surgery it may be brought to your room. For patients admitted to the hospital, discharge time is determined by your treatment team.   Patients discharged the day of surgery will not be allowed to drive home.   Make arrangements for someone to be with you for the first 24 hours of your Same Day Discharge.    Please read over the following fact sheets that you were given:   Incentive spirometer    __x__ Take these medicines the morning of surgery with A SIP OF WATER:    1. acetaminophen (TYLENOL) 500 MG tablet if needed  2. DULoxetine (CYMBALTA) 20 MG capsule  3. famotidine (PEPCID) 40 MG tablet  Take a dose the night before and the morning of surgery  4.gabapentin (NEURONTIN) 100 MG capsule  5.levothyroxine (SYNTHROID) 50 MCG tablet  6.loratadine (CLARITIN REDITABS) 10 MG dissolvable tablet             7.propranolol (INDERAL) 10 MG tablet ____ Fleet Enema (as directed)   __x__ Use CHG Soap (or wipes) as directed  ____ Use Benzoyl Peroxide Gel as instructed  ____ Use inhalers on the day of surgery  ____ Stop metformin 2 days prior to surgery    ____ Take 1/2 of usual insulin dose the night before surgery. No insulin the morning          of surgery.   ____ Stop Coumadin/Plavix/aspirin on   __x__ Stop Anti-inflammatories ibuprofen aleve and aspirin products until after surgery.    __x__ Stop supplements until after surgery.  ascorbic acid  (VITAMIN C) 1000 MG tablet, Flaxseed, Linseed, (FLAX SEED OIL) 123XX123 MG CAPS, Garlic 123XX123 MG CAPS, Misc Natural Products (BLACK CHERRY CONCENTRATE) LIQD, Beet juice capsules, Omega-3 Fatty Acids (FISH OIL) 1000 MG CAPS, Neuriva, Ageless Brain,vitamin E 180 MG (400 UNITS) capsule ____ Bring C-Pap to the hospital.    If you have any questions regarding your pre-procedure instructions,  Please call Pre-admit Testing at [phone]

## 2020-09-21 ENCOUNTER — Other Ambulatory Visit: Payer: Self-pay

## 2020-09-21 ENCOUNTER — Other Ambulatory Visit
Admission: RE | Admit: 2020-09-21 | Discharge: 2020-09-21 | Disposition: A | Discharge status: Home / Self Care | Payer: Medicare Other | Source: Ambulatory Visit | Attending: Surgery | Admitting: Surgery

## 2020-09-21 DIAGNOSIS — Z01812 Encounter for preprocedural laboratory examination: Secondary | ICD-10-CM | POA: Diagnosis present

## 2020-09-21 DIAGNOSIS — Z20822 Contact with and (suspected) exposure to covid-19: Secondary | ICD-10-CM | POA: Insufficient documentation

## 2020-09-21 LAB — SARS CORONAVIRUS 2 (TAT 6-24 HRS): SARS Coronavirus 2: NEGATIVE

## 2020-09-25 ENCOUNTER — Other Ambulatory Visit: Payer: Self-pay

## 2020-09-25 ENCOUNTER — Inpatient Hospital Stay: Payer: Medicare Other

## 2020-09-25 ENCOUNTER — Inpatient Hospital Stay: Payer: Medicare Other | Admitting: Urgent Care

## 2020-09-25 ENCOUNTER — Encounter
Admission: RE | Disposition: A | Discharge status: Home / Self Care | Payer: Self-pay | Source: Home / Self Care | Attending: Surgery

## 2020-09-25 ENCOUNTER — Observation Stay
Admission: RE | Admit: 2020-09-25 | Discharge: 2020-09-26 | Disposition: A | Discharge status: Home / Self Care | Payer: Medicare Other | Attending: Surgery | Admitting: Surgery

## 2020-09-25 ENCOUNTER — Encounter: Payer: Self-pay | Admitting: Surgery

## 2020-09-25 DIAGNOSIS — Z85828 Personal history of other malignant neoplasm of skin: Secondary | ICD-10-CM | POA: Diagnosis not present

## 2020-09-25 DIAGNOSIS — N1831 Chronic kidney disease, stage 3a: Secondary | ICD-10-CM | POA: Insufficient documentation

## 2020-09-25 DIAGNOSIS — I129 Hypertensive chronic kidney disease with stage 1 through stage 4 chronic kidney disease, or unspecified chronic kidney disease: Secondary | ICD-10-CM | POA: Diagnosis not present

## 2020-09-25 DIAGNOSIS — I4891 Unspecified atrial fibrillation: Secondary | ICD-10-CM | POA: Insufficient documentation

## 2020-09-25 DIAGNOSIS — E1122 Type 2 diabetes mellitus with diabetic chronic kidney disease: Secondary | ICD-10-CM | POA: Diagnosis not present

## 2020-09-25 DIAGNOSIS — E039 Hypothyroidism, unspecified: Secondary | ICD-10-CM | POA: Diagnosis not present

## 2020-09-25 DIAGNOSIS — Z79899 Other long term (current) drug therapy: Secondary | ICD-10-CM | POA: Diagnosis not present

## 2020-09-25 DIAGNOSIS — J45909 Unspecified asthma, uncomplicated: Secondary | ICD-10-CM | POA: Diagnosis not present

## 2020-09-25 DIAGNOSIS — Z96652 Presence of left artificial knee joint: Secondary | ICD-10-CM | POA: Insufficient documentation

## 2020-09-25 DIAGNOSIS — M1711 Unilateral primary osteoarthritis, right knee: Principal | ICD-10-CM | POA: Insufficient documentation

## 2020-09-25 DIAGNOSIS — Z8546 Personal history of malignant neoplasm of prostate: Secondary | ICD-10-CM | POA: Diagnosis not present

## 2020-09-25 DIAGNOSIS — Z856 Personal history of leukemia: Secondary | ICD-10-CM | POA: Diagnosis not present

## 2020-09-25 DIAGNOSIS — Z96651 Presence of right artificial knee joint: Secondary | ICD-10-CM

## 2020-09-25 HISTORY — PX: PARTIAL KNEE ARTHROPLASTY: SHX2174

## 2020-09-25 SURGERY — ARTHROPLASTY, KNEE, UNICOMPARTMENTAL
Anesthesia: Spinal | Site: Knee | Laterality: Right

## 2020-09-25 MED ORDER — ACETAMINOPHEN 10 MG/ML IV SOLN
INTRAVENOUS | Status: DC | PRN
  Administered 2020-09-25: 1000 mg via INTRAVENOUS

## 2020-09-25 MED ORDER — PROPRANOLOL HCL 10 MG PO TABS
10.0000 mg | ORAL_TABLET | Freq: Two times a day (BID) | ORAL | Status: DC
  Administered 2020-09-26: 10 mg via ORAL
  Filled 2020-09-25 (×4): qty 1

## 2020-09-25 MED ORDER — SODIUM CHLORIDE 0.9 % IV SOLN: INTRAVENOUS | Status: DC

## 2020-09-25 MED ORDER — CEFAZOLIN SODIUM-DEXTROSE 2-4 GM/100ML-% IV SOLN
INTRAVENOUS | Status: AC
  Filled 2020-09-25: qty 100

## 2020-09-25 MED ORDER — CEFAZOLIN SODIUM-DEXTROSE 2-4 GM/100ML-% IV SOLN
2.0000 g | Freq: Four times a day (QID) | INTRAVENOUS | Status: AC
  Administered 2020-09-25 – 2020-09-26 (×3): 2 g via INTRAVENOUS
  Filled 2020-09-25 (×3): qty 100

## 2020-09-25 MED ORDER — SODIUM CHLORIDE (PF) 0.9 % IJ SOLN
INTRAMUSCULAR | Status: DC | PRN
  Administered 2020-09-25: 10 mL

## 2020-09-25 MED ORDER — PROPOFOL 1000 MG/100ML IV EMUL
INTRAVENOUS | Status: AC
  Filled 2020-09-25: qty 100

## 2020-09-25 MED ORDER — PROPOFOL 500 MG/50ML IV EMUL
INTRAVENOUS | Status: DC | PRN
  Administered 2020-09-25: 75 ug/kg/min via INTRAVENOUS

## 2020-09-25 MED ORDER — OMEGA-3-ACID ETHYL ESTERS 1 G PO CAPS
1.0000 | ORAL_CAPSULE | Freq: Every day | ORAL | Status: DC
  Administered 2020-09-25 – 2020-09-26 (×2): 1 g via ORAL
  Filled 2020-09-25 (×2): qty 1

## 2020-09-25 MED ORDER — ONDANSETRON HCL 4 MG/2ML IJ SOLN: 4.0000 mg | Freq: Once | INTRAMUSCULAR | Status: DC | PRN

## 2020-09-25 MED ORDER — DIPHENHYDRAMINE HCL 12.5 MG/5ML PO ELIX: 12.5000 mg | ORAL_SOLUTION | ORAL | Status: DC | PRN

## 2020-09-25 MED ORDER — SODIUM CHLORIDE FLUSH 0.9 % IV SOLN
INTRAVENOUS | Status: AC
  Filled 2020-09-25: qty 10

## 2020-09-25 MED ORDER — BUPIVACAINE LIPOSOME 1.3 % IJ SUSP
INTRAMUSCULAR | Status: DC | PRN
  Administered 2020-09-25: 20 mL

## 2020-09-25 MED ORDER — VITAMIN D 25 MCG (1000 UNIT) PO TABS
1000.0000 [IU] | ORAL_TABLET | Freq: Every day | ORAL | Status: DC
  Administered 2020-09-25 – 2020-09-26 (×2): 1000 [IU] via ORAL
  Filled 2020-09-25 (×2): qty 1

## 2020-09-25 MED ORDER — FENTANYL CITRATE (PF) 100 MCG/2ML IJ SOLN
INTRAMUSCULAR | Status: DC | PRN
  Administered 2020-09-25 (×2): 50 ug via INTRAVENOUS

## 2020-09-25 MED ORDER — CEFAZOLIN SODIUM-DEXTROSE 2-4 GM/100ML-% IV SOLN
2.0000 g | INTRAVENOUS | Status: AC
  Administered 2020-09-25: 2 g via INTRAVENOUS

## 2020-09-25 MED ORDER — METOCLOPRAMIDE HCL 5 MG/ML IJ SOLN: 5.0000 mg | Freq: Three times a day (TID) | INTRAMUSCULAR | Status: DC | PRN

## 2020-09-25 MED ORDER — DOCUSATE SODIUM 100 MG PO CAPS
100.0000 mg | ORAL_CAPSULE | Freq: Two times a day (BID) | ORAL | Status: DC
  Administered 2020-09-25 – 2020-09-26 (×3): 100 mg via ORAL
  Filled 2020-09-25 (×3): qty 1

## 2020-09-25 MED ORDER — DULOXETINE HCL 20 MG PO CPEP
20.0000 mg | ORAL_CAPSULE | Freq: Every day | ORAL | Status: DC
  Administered 2020-09-26: 20 mg via ORAL
  Filled 2020-09-25 (×2): qty 1

## 2020-09-25 MED ORDER — CALCIUM CARBONATE 1250 (500 CA) MG PO TABS
1.0000 | ORAL_TABLET | Freq: Every day | ORAL | Status: DC
  Administered 2020-09-26: 500 mg via ORAL
  Filled 2020-09-25: qty 1

## 2020-09-25 MED ORDER — TRAMADOL HCL 50 MG PO TABS
50.0000 mg | ORAL_TABLET | Freq: Four times a day (QID) | ORAL | Status: DC | PRN
Start: 2020-09-25 — End: 2020-09-26

## 2020-09-25 MED ORDER — STERILE WATER FOR IRRIGATION IR SOLN
Status: DC | PRN
  Administered 2020-09-25: 1000 mL

## 2020-09-25 MED ORDER — LATANOPROST 0.005 % OP SOLN
1.0000 [drp] | Freq: Every day | OPHTHALMIC | Status: DC
  Administered 2020-09-25: 1 [drp] via OPHTHALMIC
  Filled 2020-09-25: qty 2.5

## 2020-09-25 MED ORDER — SODIUM CHLORIDE 0.9 % IV SOLN
INTRAVENOUS | Status: DC | PRN
  Administered 2020-09-25: 15 ug/min via INTRAVENOUS

## 2020-09-25 MED ORDER — TRANEXAMIC ACID 1000 MG/10ML IV SOLN
INTRAVENOUS | Status: DC | PRN
  Administered 2020-09-25: 1000 mg via TOPICAL

## 2020-09-25 MED ORDER — BLACK CHERRY CONCENTRATE PO LIQD: 15.0000 mL | Freq: Every day | ORAL | Status: DC

## 2020-09-25 MED ORDER — LEVOTHYROXINE SODIUM 50 MCG PO TABS
50.0000 ug | ORAL_TABLET | Freq: Every day | ORAL | Status: DC
  Administered 2020-09-26: 50 ug via ORAL
  Filled 2020-09-25: qty 1

## 2020-09-25 MED ORDER — FERROUS SULFATE 325 (65 FE) MG PO TABS
325.0000 mg | ORAL_TABLET | Freq: Three times a day (TID) | ORAL | Status: DC
  Administered 2020-09-25 – 2020-09-26 (×4): 325 mg via ORAL
  Filled 2020-09-25 (×4): qty 1

## 2020-09-25 MED ORDER — FENTANYL CITRATE (PF) 100 MCG/2ML IJ SOLN: 25.0000 ug | INTRAMUSCULAR | Status: DC | PRN

## 2020-09-25 MED ORDER — ONDANSETRON HCL 4 MG/2ML IJ SOLN: 4.0000 mg | Freq: Four times a day (QID) | INTRAMUSCULAR | Status: DC | PRN

## 2020-09-25 MED ORDER — BUPIVACAINE-EPINEPHRINE (PF) 0.5% -1:200000 IJ SOLN
INTRAMUSCULAR | Status: DC | PRN
  Administered 2020-09-25: 30 mL

## 2020-09-25 MED ORDER — ZINC SULFATE 220 (50 ZN) MG PO CAPS
220.0000 mg | ORAL_CAPSULE | Freq: Every day | ORAL | Status: DC
  Administered 2020-09-25 – 2020-09-26 (×2): 220 mg via ORAL
  Filled 2020-09-25 (×2): qty 1

## 2020-09-25 MED ORDER — LACTATED RINGERS IV SOLN: INTRAVENOUS | Status: DC

## 2020-09-25 MED ORDER — B COMPLEX-C PO TABS
1.0000 | ORAL_TABLET | Freq: Every day | ORAL | Status: DC
  Administered 2020-09-25 – 2020-09-26 (×2): 1 via ORAL
  Filled 2020-09-25 (×2): qty 1

## 2020-09-25 MED ORDER — PRIMIDONE 50 MG PO TABS
50.0000 mg | ORAL_TABLET | Freq: Two times a day (BID) | ORAL | Status: DC
  Administered 2020-09-25 – 2020-09-26 (×3): 50 mg via ORAL
  Filled 2020-09-25 (×5): qty 1

## 2020-09-25 MED ORDER — ACETAMINOPHEN 500 MG PO TABS
500.0000 mg | ORAL_TABLET | Freq: Four times a day (QID) | ORAL | Status: AC
  Administered 2020-09-25 – 2020-09-26 (×3): 500 mg via ORAL
  Filled 2020-09-25 (×4): qty 1

## 2020-09-25 MED ORDER — FAMOTIDINE 20 MG PO TABS
40.0000 mg | ORAL_TABLET | Freq: Two times a day (BID) | ORAL | Status: DC
  Administered 2020-09-25 – 2020-09-26 (×2): 40 mg via ORAL
  Filled 2020-09-25 (×2): qty 2

## 2020-09-25 MED ORDER — DEXAMETHASONE SODIUM PHOSPHATE 10 MG/ML IJ SOLN
INTRAMUSCULAR | Status: DC | PRN
  Administered 2020-09-25: 10 mg via INTRAVENOUS

## 2020-09-25 MED ORDER — BUPIVACAINE LIPOSOME 1.3 % IJ SUSP
INTRAMUSCULAR | Status: AC
  Filled 2020-09-25: qty 20

## 2020-09-25 MED ORDER — LORATADINE 10 MG PO TABS
10.0000 mg | ORAL_TABLET | Freq: Every day | ORAL | Status: DC
  Administered 2020-09-26: 10 mg via ORAL
  Filled 2020-09-25: qty 1

## 2020-09-25 MED ORDER — CHLORHEXIDINE GLUCONATE 0.12 % MT SOLN
OROMUCOSAL | Status: AC
  Filled 2020-09-25: qty 15

## 2020-09-25 MED ORDER — SODIUM CHLORIDE 0.9 % IR SOLN
Status: DC | PRN
  Administered 2020-09-25: 3000 mL

## 2020-09-25 MED ORDER — ASCORBIC ACID 500 MG PO TABS
1000.0000 mg | ORAL_TABLET | Freq: Every day | ORAL | Status: DC
  Administered 2020-09-25 – 2020-09-26 (×3): 1000 mg via ORAL
  Filled 2020-09-25 (×2): qty 2

## 2020-09-25 MED ORDER — TRANEXAMIC ACID 1000 MG/10ML IV SOLN
INTRAVENOUS | Status: AC
  Filled 2020-09-25: qty 10

## 2020-09-25 MED ORDER — VITAMIN E 180 MG (400 UNIT) PO CAPS
400.0000 [IU] | ORAL_CAPSULE | Freq: Every day | ORAL | Status: DC
  Administered 2020-09-26: 400 [IU] via ORAL
  Filled 2020-09-25: qty 1

## 2020-09-25 MED ORDER — MAGNESIUM HYDROXIDE 400 MG/5ML PO SUSP
30.0000 mL | Freq: Every day | ORAL | Status: DC | PRN
  Administered 2020-09-26: 30 mL via ORAL
  Filled 2020-09-25: qty 30

## 2020-09-25 MED ORDER — GARLIC 1000 MG PO CAPS: 1.0000 | ORAL_CAPSULE | Freq: Every morning | ORAL | Status: DC

## 2020-09-25 MED ORDER — BISACODYL 10 MG RE SUPP: 10.0000 mg | Freq: Every day | RECTAL | Status: DC | PRN

## 2020-09-25 MED ORDER — DONEPEZIL HCL 5 MG PO TABS
10.0000 mg | ORAL_TABLET | Freq: Every day | ORAL | Status: DC
  Administered 2020-09-25: 10 mg via ORAL
  Filled 2020-09-25: qty 2

## 2020-09-25 MED ORDER — ORAL CARE MOUTH RINSE: 15.0000 mL | Freq: Once | OROMUCOSAL | Status: AC

## 2020-09-25 MED ORDER — PHENYLEPHRINE HCL (PRESSORS) 10 MG/ML IV SOLN
INTRAVENOUS | Status: AC
  Filled 2020-09-25: qty 1

## 2020-09-25 MED ORDER — ENOXAPARIN SODIUM 40 MG/0.4ML IJ SOSY
40.0000 mg | PREFILLED_SYRINGE | INTRAMUSCULAR | Status: DC
  Administered 2020-09-26: 40 mg via SUBCUTANEOUS
  Filled 2020-09-25: qty 0.4

## 2020-09-25 MED ORDER — METOCLOPRAMIDE HCL 10 MG PO TABS: 5.0000 mg | ORAL_TABLET | Freq: Three times a day (TID) | ORAL | Status: DC | PRN

## 2020-09-25 MED ORDER — ACETAMINOPHEN 10 MG/ML IV SOLN
INTRAVENOUS | Status: AC
  Filled 2020-09-25: qty 100

## 2020-09-25 MED ORDER — GLYCOPYRROLATE 0.2 MG/ML IJ SOLN
INTRAMUSCULAR | Status: DC | PRN
  Administered 2020-09-25: .2 mg via INTRAVENOUS

## 2020-09-25 MED ORDER — PROPOFOL 10 MG/ML IV BOLUS
INTRAVENOUS | Status: AC
  Filled 2020-09-25: qty 20

## 2020-09-25 MED ORDER — 0.9 % SODIUM CHLORIDE (POUR BTL) OPTIME
TOPICAL | Status: DC | PRN
  Administered 2020-09-25: 500 mL

## 2020-09-25 MED ORDER — CHLORHEXIDINE GLUCONATE 0.12 % MT SOLN
15.0000 mL | Freq: Once | OROMUCOSAL | Status: AC
  Administered 2020-09-25: 15 mL via OROMUCOSAL

## 2020-09-25 MED ORDER — FLAX SEED OIL 1000 MG PO CAPS: 1000.0000 mg | ORAL_CAPSULE | Freq: Every day | ORAL | Status: DC

## 2020-09-25 MED ORDER — BUPIVACAINE-EPINEPHRINE (PF) 0.5% -1:200000 IJ SOLN
INTRAMUSCULAR | Status: AC
  Filled 2020-09-25: qty 30

## 2020-09-25 MED ORDER — BUPIVACAINE HCL (PF) 0.5 % IJ SOLN
INTRAMUSCULAR | Status: DC | PRN
  Administered 2020-09-25: 2.8 mL via INTRATHECAL

## 2020-09-25 MED ORDER — ONDANSETRON HCL 4 MG/2ML IJ SOLN
INTRAMUSCULAR | Status: DC | PRN
  Administered 2020-09-25: 4 mg via INTRAVENOUS

## 2020-09-25 MED ORDER — ONDANSETRON HCL 4 MG PO TABS: 4.0000 mg | ORAL_TABLET | Freq: Four times a day (QID) | ORAL | Status: DC | PRN

## 2020-09-25 MED ORDER — FLEET ENEMA 7-19 GM/118ML RE ENEM: 1.0000 | ENEMA | Freq: Once | RECTAL | Status: DC | PRN

## 2020-09-25 MED ORDER — ACETAMINOPHEN 325 MG PO TABS: 325.0000 mg | ORAL_TABLET | Freq: Four times a day (QID) | ORAL | Status: DC | PRN

## 2020-09-25 MED ORDER — GABAPENTIN 100 MG PO CAPS
100.0000 mg | ORAL_CAPSULE | Freq: Three times a day (TID) | ORAL | Status: DC
  Administered 2020-09-25 – 2020-09-26 (×3): 100 mg via ORAL
  Filled 2020-09-25 (×3): qty 1

## 2020-09-25 MED ORDER — HYDROCODONE-ACETAMINOPHEN 5-325 MG PO TABS: 1.0000 | ORAL_TABLET | ORAL | Status: DC | PRN

## 2020-09-25 MED ORDER — FENTANYL CITRATE (PF) 100 MCG/2ML IJ SOLN
INTRAMUSCULAR | Status: AC
  Filled 2020-09-25: qty 2

## 2020-09-25 MED ORDER — MORPHINE SULFATE (PF) 2 MG/ML IV SOLN
0.5000 mg | INTRAVENOUS | Status: DC | PRN
  Administered 2020-09-26: 1 mg via INTRAVENOUS
  Filled 2020-09-25: qty 1

## 2020-09-25 SURGICAL SUPPLY — 66 items
BEARING MENISCAL TIBIAL 6 SM R (Orthopedic Implant) ×2 IMPLANT
BIT DRILL QUICK REL 1/8 2PK SL (DRILL) ×1 IMPLANT
BNDG ELASTIC 6X5.8 VLCR STR LF (GAUZE/BANDAGES/DRESSINGS) ×2 IMPLANT
CANISTER SUCT 1200ML W/VALVE (MISCELLANEOUS) ×2 IMPLANT
CEMENT BONE R 1X40 (Cement) ×2 IMPLANT
CEMENT VACUUM MIXING SYSTEM (MISCELLANEOUS) ×2 IMPLANT
CHLORAPREP W/TINT 26 (MISCELLANEOUS) ×4 IMPLANT
COMPONENT TIBIAL OXFRD MEDL RT (Orthopedic Implant) ×1 IMPLANT
COOLER POLAR GLACIER W/PUMP (MISCELLANEOUS) ×2 IMPLANT
COVER MAYO STAND REUSABLE (DRAPES) ×2 IMPLANT
CUFF TOURN SGL QUICK 24 (TOURNIQUET CUFF)
CUFF TOURN SGL QUICK 34 (TOURNIQUET CUFF)
CUFF TRNQT CYL 24X4X16.5-23 (TOURNIQUET CUFF) IMPLANT
CUFF TRNQT CYL 34X4.125X (TOURNIQUET CUFF) IMPLANT
DRAPE C-ARM XRAY 36X54 (DRAPES) IMPLANT
DRILL QUICK RELEASE 1/8 INCH (DRILL) ×1
DRSG MEPILEX SACRM 8.7X9.8 (GAUZE/BANDAGES/DRESSINGS) ×2 IMPLANT
DRSG OPSITE POSTOP 4X12 (GAUZE/BANDAGES/DRESSINGS) ×2 IMPLANT
DRSG OPSITE POSTOP 4X6 (GAUZE/BANDAGES/DRESSINGS) ×2 IMPLANT
ELECT CAUTERY BLADE 6.4 (BLADE) ×2 IMPLANT
ELECT REM PT RETURN 9FT ADLT (ELECTROSURGICAL) ×2
ELECTRODE REM PT RTRN 9FT ADLT (ELECTROSURGICAL) ×1 IMPLANT
GAUZE 4X4 16PLY ~~LOC~~+RFID DBL (SPONGE) ×4 IMPLANT
GAUZE SPONGE 4X4 12PLY STRL (GAUZE/BANDAGES/DRESSINGS) ×2 IMPLANT
GAUZE XEROFORM 1X8 LF (GAUZE/BANDAGES/DRESSINGS) ×2 IMPLANT
GLOVE SRG 8 PF TXTR STRL LF DI (GLOVE) ×1 IMPLANT
GLOVE SURG ENC MOIS LTX SZ7.5 (GLOVE) ×8 IMPLANT
GLOVE SURG ENC MOIS LTX SZ8 (GLOVE) ×8 IMPLANT
GLOVE SURG UNDER LTX SZ8 (GLOVE) ×2 IMPLANT
GLOVE SURG UNDER POLY LF SZ8 (GLOVE) ×1
GOWN STRL REUS W/ TWL LRG LVL3 (GOWN DISPOSABLE) ×1 IMPLANT
GOWN STRL REUS W/ TWL XL LVL3 (GOWN DISPOSABLE) ×1 IMPLANT
GOWN STRL REUS W/TWL LRG LVL3 (GOWN DISPOSABLE) ×1
GOWN STRL REUS W/TWL XL LVL3 (GOWN DISPOSABLE) ×1
HOOD PEEL AWAY FLYTE STAYCOOL (MISCELLANEOUS) ×6 IMPLANT
IV NS IRRIG 3000ML ARTHROMATIC (IV SOLUTION) ×2 IMPLANT
KIT TURNOVER KIT A (KITS) ×2 IMPLANT
MANIFOLD NEPTUNE II (INSTRUMENTS) ×2 IMPLANT
MAT ABSORB  FLUID 56X50 GRAY (MISCELLANEOUS) ×1
MAT ABSORB FLUID 56X50 GRAY (MISCELLANEOUS) ×1 IMPLANT
NDL SAFETY ECLIPSE 18X1.5 (NEEDLE) ×1 IMPLANT
NEEDLE HYPO 18GX1.5 SHARP (NEEDLE) ×1
NEEDLE SPNL 20GX3.5 QUINCKE YW (NEEDLE) ×2 IMPLANT
NS IRRIG 1000ML POUR BTL (IV SOLUTION) IMPLANT
NS IRRIG 500ML POUR BTL (IV SOLUTION) ×2 IMPLANT
PACK BLADE SAW RECIP 70 3 PT (BLADE) ×2 IMPLANT
PACK TOTAL KNEE (MISCELLANEOUS) ×2 IMPLANT
PAD ABD DERMACEA PRESS 5X9 (GAUZE/BANDAGES/DRESSINGS) ×4 IMPLANT
PAD WRAPON POLAR KNEE (MISCELLANEOUS) ×1 IMPLANT
PEG FEMORAL PEGGED STRL SM (Knees) ×2 IMPLANT
PULSAVAC PLUS IRRIG FAN TIP (DISPOSABLE) ×2
SPONGE T-LAP 18X18 ~~LOC~~+RFID (SPONGE) ×6 IMPLANT
STAPLER SKIN PROX 35W (STAPLE) ×2 IMPLANT
STRAP SAFETY 5IN WIDE (MISCELLANEOUS) ×2 IMPLANT
SUCTION FRAZIER HANDLE 10FR (MISCELLANEOUS) ×1
SUCTION TUBE FRAZIER 10FR DISP (MISCELLANEOUS) ×1 IMPLANT
SUT VIC AB 0 CT1 36 (SUTURE) ×2 IMPLANT
SUT VIC AB 2-0 CT1 27 (SUTURE) ×4
SUT VIC AB 2-0 CT1 TAPERPNT 27 (SUTURE) ×4 IMPLANT
SYR 10ML LL (SYRINGE) ×2 IMPLANT
SYR 30ML LL (SYRINGE) ×4 IMPLANT
TAPE TRANSPORE STRL 2 31045 (GAUZE/BANDAGES/DRESSINGS) ×2 IMPLANT
TIBIAL OXFORD MEDIAL RT (Orthopedic Implant) ×2 IMPLANT
TIP FAN IRRIG PULSAVAC PLUS (DISPOSABLE) ×1 IMPLANT
WATER STERILE IRR 1000ML POUR (IV SOLUTION) ×2 IMPLANT
WRAPON POLAR PAD KNEE (MISCELLANEOUS) ×2

## 2020-09-25 NOTE — Op Note (Signed)
09/25/2020  9:58 AM  Patient:   Allen Chang  Pre-Op Diagnosis:   Osteoarthritis of medial compartment, right knee.  Post-Op Diagnosis:   Same  Procedure:   Right unicondylar knee arthroplasty.  Surgeon:   Pascal Lux, MD  Assistant:   Cameron Proud, PA-C; Ardelle Park, PA-S  Anesthesia:   Spinal  Findings:   As above.  Complications:   None  EBL:   5 cc  Fluids:   800 cc crystalloid  UOP:   None  TT:   80 minutes at 300 mmHg  Drains:   None  Closure:   Staples  Implants:   All-cemented Biomet [place] system with a small femoral component, a "B" sized tibial tray, and a 6 mm meniscal bearing insert.  Brief Clinical Note:   The patient is an 87 year old male with a history of progressive worsening medial sided right knee pain. His symptoms have progressed despite medications, activity modification, etc. His history and examination consistent with moderate to severe degenerative joint disease, primarily involving the medial compartment. The patient presents at this time for a right partial knee replacement.  Procedure:   The patient was brought into the operating room and a spinal placed by the anesthesiologist. The patient was lain in the supine position and a Foley catheter inserted. The patient was repositioned so that the non-surgical leg was placed in a flexed and abducted position in the yellow fin leg holder while the surgical extremity was placed over the Biomet leg holder. The right lower extremity was prepped with ChloraPrep solution before being draped sterilely. Preoperative antibiotics were administered. After performing a timeout to verify the appropriate surgical site, the limb was exsanguinated with an Esmarch and the tourniquet inflated to 300 mmHg.   A standard anterior approach to the knee was made through an approximately 3.5-4 inch incision. The incision was carried down through the subcutaneous tissues to expose the superficial retinaculum. This was  split the length the incision and the medial flap elevated sufficiently to expose the medial retinaculum. The medial retinaculum was incised along the medial border of the patella tendon and extended proximally along the medial border of the patella, leaving a 3-4 mm cuff of tissue. The soft tissues were elevated off the anteromedial aspect of the proximal tibia. The anterior portion of the meniscus was removed after performing a subtotal excision of the infrapatellar fat pad. The anterior cruciate ligament was inspected and found to be in excellent condition. Osteophytes were removed from the inferior pole of the patella as well as from the notch using a quarter-inch osteotome. There were significant degenerative changes of both the femur and tibia on the medial side. The medial femoral condyle was sized using the small and medium sizers. It was felt that the small guide best optimized the contour of the femur. This was left in place and the external tibial guide positioned. The coupling device was used to connect the guide to the medial femoral condylar sizer to optimize appropriate orientation. Two guide pins were inserted into the cutting block before the coupling device and sizer were removed. The appropriate tibial cut was made using the oscillating and reciprocating saws. The piece was removed in its entirety and taken to the back table where it was sized and found to be optimally replicated by a "B" sized component. The 8 mm spacer was inserted to verify that sufficient bone had been removed.  Attention was directed to femoral side. The intramedullary canal was accessed through a  4 mm drill hole. The intramedullary guide was positioned before the guide for the femoral condylar holes was positioned. The appropriate coupling device connected this guide to the intramedullary guide before both drill holes were created in the distal aspect of the medial femoral condyle. The devices were removed and the  posterior condylar cutting block inserted. The appropriate cut was made using the reciprocating saw and this piece removed. The #0 spigot was inserted and the initial bone milling performed. A trial femoral component was inserted and both the flexion and extension gaps measured. In flexion, the gap measured 8 mm whereas in extension, it measured 4 mm. Therefore, the #4 spigot was selected and the secondary bone milling performed. Repeat sizing demonstrated symmetric flexion and extension gaps. The bone was removed from the postero-medial and postero-lateral aspects of the femoral condyle, as well as from the beneath the collar of the spigot. Bone also was removed from the anterior portion of the femur so as to minimize any potential impingement with the meniscal bearing insert. The trial components removed and several drill holes placed into the distal femoral condyle to further augment cement fixation.  Attention was redirected to the tibial side. The "B" sized tibial tray was positioned and temporarily secured using the appropriate spiked nail. The keel was created using the bi-bladed reciprocating saw and hoe. The keeled "B" sized trial tibial tray was inserted to be sure that it seated properly. At this point, a total of 20 cc of Exparel diluted out to 60 cc with normal saline and 30 cc of 0.5% Sensorcaine was injected in and around the posterior and medial capsular tissues, as well as the peri-incisional tissues to help with postoperative pain control.  The bony surfaces were prepared for cementing by irrigating them thoroughly with bacitracin saline solution using the jet lavage system before packing them with a dry Ray-Tec sponge. Meanwhile, cement was being mixed on the back table. When the cement was ready, the tibial tray was cemented in first. The excess cement was removed using a Surveyor, quantity after impacting it into place. Next, the femoral component was impacted into place. Again the excess  cement was removed using a Surveyor, quantity. The 6 mm spacer was inserted and the knee brought into near full extension while the cement hardened. Once the cement hardened, the spacer was removed and the 5 mm and 6 mm meniscal bearing inserts were trialed. The 6 mm insert demonstrated excellent tracking while the knee was placed through a range of motion, and showed no evidence towards subluxation or dislocation. In addition, it did not fit too tightly. Therefore, the permanent 6 mm meniscal bearing insert was snapped into position after verifying that no cement had been retained posteriorly. Again the knee was placed through a range of motion with the findings as described above.  The wound was copiously irrigated with sterile saline solution via the jet lavage system before the retinacular layer was reapproximated using #0 Vicryl interrupted sutures. At this point, 1 g of transexemic acid in 10 cc of normal saline was injected intra-articularly. The subcutaneous tissues were closed in two layers using 2-0 Vicryl interrupted sutures before the skin was closed using staples. A sterile occlusive dressing was applied to the knee before the patient was awakened. The patient was transferred back to his/her hospital bed and returned to the recovery room in satisfactory condition after tolerating the procedure well. A Polar Care device was applied to the knee as well.

## 2020-09-25 NOTE — Evaluation (Signed)
Physical Therapy Evaluation Patient Details Name: Allen Chang MRN: [id number] DOB: [birth year] Today's Date: 09/25/2020   History of Present Illness  Pt is an 87 y/o male with hx of progressively worsening medial sided R knee pain who is now s/p partial R knee replacement as of 09/25/2020. PMH includes anemia, asthma, CA (prostate), CKD stage 3, GERD, hiatal hernia, HLD, HTN, IDA (2018), tremor, OA of B knees   Pt with previous L partial knee replacement (2016) where he successfully completed [place] [facility]. Prior to R partial knee replacement pt was ambulating with a SPC for limited distances (a few hundred feet, per pt) d/t knee pain, but reports was otherwise independent with ADLs. Pt is a retired Psychologist, sport and exercise. He lives with spouse with grandkids nearby who can help him with mobility needs/ADLs.  Clinical Impression  Pt is a pleasant 87 y/o male s/p R partial knee replacement. Pt supine in bed at start of evaluation with nursing present; pt is alert and agreeable to PT. Pt spouse present. Pt reports 4/10 R knee pain currently and that he has stood at RW once today. PT BLE sensation intact to light touch. Pt able to demo A/AROM without increased pain of R LE (SLR, heel slides, ankle pumps). The pt requires min assist with bed mobility and transfers, and was able to ambulate with CGA with RW within hospital room to chair. Pt left in chair with feet elevated at end of session to maintain knee extension. PT instructed pt in precautions and exercises. Chair alarm set and pt alerted to call bell. Nursing notified of pt mobility status. Pt to practice stairs future session as he needs to be able to ascend/descend 3-4 steps to enter his home. Pt will benefit from further skilled PT to improve pain, BLE strength, and mobility in order to decrease fall risk and return pt to PLOF.     Follow Up Recommendations Home health PT    Equipment Recommendations  Rolling walker with 5" wheels    Recommendations for Other Services        Precautions / Restrictions Precautions Precautions: Knee Precaution Booklet Issued: Yes (comment) Precaution Comments: WBAT RLE Restrictions Weight Bearing Restrictions: Yes RLE Weight Bearing: Weight bearing as tolerated      Mobility  Bed Mobility Overal bed mobility: Needs Assistance Bed Mobility: Sidelying to Sit   Sidelying to sit: Min assist       General bed mobility comments: PT provided min a to pt's trunk for transfer from side-lying to seated at edge of bed, where pt was able to sit unsupported.    Transfers Overall transfer level: Needs assistance Equipment used: Rolling walker (2 wheeled) Transfers: Sit to/from Stand Sit to Stand: Min assist         General transfer comment: PT provides min assist for pt STS, insturcts pt in safe technique (hand/foot placement)  Ambulation/Gait Ambulation/Gait assistance: Min guard Gait Distance (Feet): 10 Feet Assistive device: Rolling walker (2 wheeled) Gait Pattern/deviations: Step-to pattern;Decreased step length - right;Decreased step length - left;Decreased stance time - right Gait velocity: not formally tested, observed to be decreased   General Gait Details: Pt ambulates with RW in room, takes small steps B. PT providing CGA. Pt initially somewhat tremulous, improved with steps.  Stairs            Wheelchair Mobility    Modified Rankin (Stroke Patients Only)       Balance Overall balance assessment: Needs assistance Sitting-balance support: No upper extremity supported;Feet supported Sitting  balance-Leahy Scale: Fair Sitting balance - Comments: Pt demo'd ability to remain seated at edge of bed (feet supported) without assistance   Standing balance support: Bilateral upper extremity supported Standing balance-Leahy Scale: Poor Standing balance comment: Pt requires BUE support at RW when standing and at least CGA from PT                             Pertinent Vitals/Pain Pain  Assessment: 0-10 Pain Score: 4  Pain Location: R medial knee Pain Intervention(s): Monitored during session;Repositioned;Ice applied;Limited activity within patient's tolerance    Home Living Family/patient expects to be discharged to:: Private residence Living Arrangements: Spouse/significant other Available Help at Discharge: Family;Available 24 hours/day Type of Home: House Home Access: Stairs to enter Entrance Stairs-Rails: Right Entrance Stairs-Number of Steps: 3-4 steps Home Layout: Two level (Two levels but pt and spouse live on first floor, have bedroom and bathroom on first floor, they do not go upstairs) Home Equipment: Walker - 2 wheels;Cane - single point;Grab bars - toilet;Shower seat;Grab bars - tub/shower Additional Comments: Per pt bedroom, bathroom all accessible on first floor, does not use second floor.    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 Pt reports for PLOF he was indep with majority of ADLs, used SPC for ambulation, but walked limited distances d/t R knee pain.     Hand Dominance        Extremity/Trunk Assessment                Communication   Communication: No difficulties  Cognition Arousal/Alertness: Awake/alert Behavior During Therapy: WFL for tasks assessed/performed Overall Cognitive Status: Within Functional Limits for tasks assessed                                        General Comments General comments (skin integrity, edema, etc.): R LE wrapped, RLE in extended position    Exercises Total Joint Exercises Ankle Circles/Pumps: AAROM;AROM;Right;Left;10 reps Quad Sets: AROM;Right;10 reps Heel Slides: Right;5 reps;AAROM Straight Leg Raises: AROM;Both;10 reps Goniometric ROM: Pt achieves 100 deg. knee flexion, lacking 3 deg. from full knee ext. on RLE Other Exercises Other Exercises:  Pt ambulates in room approx 10 ft by walking backwards and forwards with RW, CGA. PT instructs pt in precautions, proper RLE positioning and provides handout.   Assessment/Plan    PT Assessment Patient needs continued PT services  PT Problem List Decreased strength;Decreased range of motion;Decreased activity tolerance;Decreased balance;Decreased mobility;Decreased coordination;Decreased knowledge of use of DME;Decreased knowledge of precautions;Pain       PT Treatment Interventions DME instruction;Gait training;Stair training;Functional mobility training;Therapeutic activities;Therapeutic exercise;Balance training;Neuromuscular re-education;Patient/family education;Manual techniques;Modalities    PT Goals (Current goals can be found in the Care Plan section)  Acute Rehab PT Goals Patient Stated Goal: to go home PT Goal Formulation: With patient/family Time For Goal Achievement: 10/09/20 Potential to Achieve Goals: Good    Frequency BID   Barriers to discharge   Pt has not yet attempted steps, still limited in ambulation ability compared to PLOF    Co-evaluation               AM-PAC PT "6 Clicks" Mobility  Outcome Measure Help needed turning from your back  to your side while in a flat bed without using bedrails?: A Little Help needed moving from lying on your back to sitting on the side of a flat bed without using bedrails?: A Little Help needed moving to and from a bed to a chair (including a wheelchair)?: A Little Help needed standing up from a chair using your arms (e.g., wheelchair or bedside chair)?: A Little Help needed to walk in hospital room?: A Little Help needed climbing 3-5 steps with a railing? : A Lot 6 Click Score: 17    End of Session Equipment Utilized During Treatment: Gait belt Activity Tolerance: Patient tolerated treatment well;No increased pain Patient left: in chair;with call bell/phone within reach;with chair alarm set;with family/visitor  present;with SCD's reapplied (icepack applied to R knee, R knee in extended position with towel roll placed under ankle) Nurse Communication: Mobility status PT Visit Diagnosis: Other abnormalities of gait and mobility (R26.89);Muscle weakness (generalized) (M62.81);History of falling (Z91.81);Pain;Unsteadiness on feet (R26.81) Pain - Right/Left: Right Pain - part of body: Knee    Time: [id number] PT Time Calculation (min) (ACUTE ONLY): 51 min   Charges:   PT Evaluation $PT Eval Low Complexity: 1 Low PT Treatments $Gait Training: 8-22 mins         Ricard Dillon PT, DPT  Zollie Pee 09/25/2020, 5:40 PM

## 2020-09-25 NOTE — Anesthesia Procedure Notes (Signed)
Spinal  Patient location during procedure: OR Reason for block: surgical anesthesia Staffing Performed: anesthesiologist  Preanesthetic Checklist Completed: patient identified, IV checked, site marked, risks and benefits discussed, surgical consent, monitors and equipment checked, pre-op evaluation and timeout performed Spinal Block Patient position: sitting Prep: ChloraPrep Patient monitoring: heart rate, cardiac monitor, continuous pulse ox and blood pressure Approach: midline Location: L5-S1 Injection technique: single-shot Needle Needle type: Quincke  Needle gauge: 22 G Needle length: 9 cm Assessment Sensory level: T4 Events: CSF return

## 2020-09-25 NOTE — Progress Notes (Signed)
PHARMACIST - PHYSICIAN ORDER COMMUNICATION  CONCERNING: P&T Medication Policy on Herbal Medications  DESCRIPTION:  This patient's order for:   Flax seed Garlic  [place]  has been noted.  This product(s) is classified as an "herbal" or natural product. Due to a lack of definitive safety studies or FDA approval, nonstandard manufacturing practices, plus the potential risk of unknown drug-drug interactions while on inpatient medications, the Pharmacy and Therapeutics Committee does not permit the use of "herbal" or natural products of this type within [facility].   ACTION TAKEN: The pharmacy department is unable to verify this order at this time and your patient has been informed of this safety policy. Please reevaluate patient's clinical condition at discharge and address if the herbal or natural product(s) should be resumed at that time.   Dream Harman Rodriguez-Guzman PharmD, BCPS 09/25/2020 12:16 PM

## 2020-09-25 NOTE — H&P (Signed)
History of Present Illness: Allen Chang is a 87 y.o.male who is being referred by Cameron Proud, PA-C, for right knee pain. The symptoms began many years ago but have been worsening over the past year or so. He has been treated by Cameron Proud, PA-C, with several steroid injections and viscosupplementation injections all with only temporary partial relief of his symptoms. Therefore, the patient has been referred to me for further evaluation and treatment. He reports 6/10 pain on today's visit. The pain is located along the medial aspect of the knee. The pain is described as aching, dull and stabbing. The symptoms are aggravated using stairs, at higher levels of activity, walking and standing. He also describes no mechanical symptoms. He has associated mild swelling and deformity. He has tried acetaminophen, over-the-counter medications, steroid injections and viscosupplementation injections with temporary partial relief.  Current Outpatient Medications:  acetaminophen (TYLENOL) 500 MG tablet Take 1,000 mg by mouth 2 (two) times daily as needed for Pain   ascorbic acid, vitamin C, (VITAMIN C) 1000 MG tablet Take 1,000 mg by mouth once daily   calcium carbonate 600 mg calcium (1,500 mg) Tab tablet Take 1 tablet by mouth daily with breakfast   cholecalciferol (VITAMIN D3) 1000 unit tablet Take 1,000 Units by mouth once daily   coffee xt/phosphatidyl serine (NEURIVA ORIGINAL ORAL) Take by mouth   docosahexaenoic acid-epa 120-180 mg Cap Take by mouth Take 1 capsule by mouth daily.   donepeziL (ARICEPT) 10 MG tablet   donepeziL (ARICEPT) 5 MG tablet Take 1 tablet (5 mg total) by mouth nightly for 180 days 90 tablet 1   DULoxetine (CYMBALTA) 20 MG DR capsule Take 1 capsule (20 mg total) by mouth once daily 90 capsule 3   famotidine (PEPCID) 40 MG tablet Take 1 tablet (40 mg total) by mouth 2 (two) times daily Take 20 minutes before breakfast and dinner 180 tablet 3   ferrous sulfate 325 (65 FE) MG EC  tablet Take 1 tablet (325 mg total) by mouth 3 (three) times daily with meals 270 tablet 3   flaxseed oil Oil Take 1 capsule by mouth once daily.   gabapentin (NEURONTIN) 100 MG capsule Take 1 capsule (100 mg total) by mouth nightly 90 capsule 3   garlic [id number] mg Cap Take 1,000 mg by mouth once daily.   latanoprost (XALATAN) 0.005 % ophthalmic solution Place 1 drop into both eyes nightly   levothyroxine (SYNTHROID) 50 MCG tablet Take 1 tablet (50 mcg total) by mouth once daily Take on an empty stomach with a glass of water at least 30-60 minutes before breakfast. 90 tablet 3   loratadine (CLARITIN) 10 mg capsule Take 10 mg by mouth once daily.   multivitamin with minerals tablet Take 1 tablet by mouth once daily   omega-3 fatty acids/fish oil 340-1,000 mg capsule Take 1 capsule by mouth 2 (two) times daily.   primidone (MYSOLINE) 50 MG tablet Take 1 tablet (50 mg total) by mouth 2 (two) times daily 180 tablet 1   propranoloL (INDERAL) 10 MG tablet Take 1 tablet (10 mg total) by mouth 2 (two) times daily 180 tablet 3   UNABLE TO FIND Med Name: black cherry concentrate liquid 2 tbsp   vit B complex 100 no.2/herbs (VITAMIN B COMPLEX 100 2-HERBS ORAL) Take 1 capsule by mouth once daily   vit C/vit E ac/selenium/ginkgo (MEMORY COMPLEX ORAL) Take by mouth   vitamin E 400 UNIT capsule Take 400 Units by mouth once daily   ZINC  ORAL Take by mouth   Allergies:   Oxycodone-Acetaminophen Hallucination   Voltaren [Diclofenac Sodium] Palpitations   Past Medical History:   Actinic keratosis   Allergic state   Anemia   Asthma without status asthmaticus, unspecified   Cancer (CMS-HCC) - prostate   Chronic kidney disease (stage 3)   GERD (gastroesophageal reflux disease)   Hiatal hernia   History of colon polyps 11/13/2016   Hyperlipidemia   Hypertension   IDA (iron deficiency anemia) 11/13/2016   Occasional tremors   Osteoarthritis (bilateral knees)  Tremor   Past Surgical History:   BASAL CELL  CARCINOMA EXCISION   CHOLECYSTECTOMY 10/10/14 (Dr [name])   COLONOSCOPY 06/27/2011 (Adenomatous Polyp)   COLONOSCOPY 08/10/2013 ([place] Adenomatous Polyp)   COLONOSCOPY 02/16/2017 (Adenomatous Polyps: CBF 01/2020)   EGD 02/16/2017 (No repeat per RTE)   extensive arthroscopic debridement arthroscopic subacromail decompression mini-open rotator cuff repair, and mini-open biceps tenodesis left shoulder Left 02/07/2016 (Dr. Roland Rack)   [place] 01/2010   HERNIA REPAIR x2   JOINT REPLACEMENT   left unicondylar knee arthroplasty Left 12/12/2014 (Dr. Roland Rack)   [place] (seed implants)   shoulder surgery Right   Family History:   Cancer Father (Bone)   High blood pressure (Hypertension) Mother   Osteoporosis (Thinning of bones) Mother   Brain cancer Son   Lung cancer Brother   Alzheimer's disease Sister   Colon cancer Neg Hx   Colon polyps Neg Hx   Social History:   Socioeconomic History:   Marital status: Married  Tobacco Use   Smoking status: Never Smoker   Smokeless tobacco: Never Used  Substance and Sexual Activity   Alcohol use: No  Alcohol/week: 0.0 standard drinks   Drug use: No   Sexual activity: Not Currently   Review of Systems:  A comprehensive 14 point ROS was performed, reviewed, and the pertinent orthopaedic findings are documented in the HPI.  Physical Exam: Vitals:  08/06/20 1317  BP: 132/72  Weight: 78.3 kg (172 lb 9.6 oz)  Height: 175.3 cm ('5\' 9"'$ )  PainSc: 6  PainLoc: Knee   General/Constitutional: The patient appears to be well-nourished, well-developed, and in no acute distress. Neuro/Psych: Normal mood and affect, oriented to person, place and time. Eyes: Non-icteric. Pupils are equal, round, and reactive to light, and exhibit synchronous movement. Lymphatic: No palpable adenopathy. Respiratory: Lungs clear to auscultation, Normal chest excursion, No wheezes and Non-labored breathing Cardiovascular: Regular rate and  rhythm. No murmurs. and No edema, swelling or tenderness, except as noted in detailed exam. Vascular: No edema, swelling or tenderness, except as noted in detailed exam. Integumentary: No impressive skin lesions present, except as noted in detailed exam. Musculoskeletal: Unremarkable, except as noted in detailed exam.  Right knee exam: GAIT: mild limp and uses a cane. ALIGNMENT: mild varus SKIN: unremarkable SWELLING: minimal EFFUSION: trace WARMTH: no warmth TENDERNESS: mild over the medial joint line ROM: full without pain McMURRAY'S: equivocal PATELLOFEMORAL: normal tracking with no peri-patellar tenderness and negative apprehension sign CREPITUS: no LACHMAN'S: negative PIVOT SHIFT: negative ANTERIOR DRAWER: negative POSTERIOR DRAWER: negative VARUS/VALGUS: Mildly positive pseudolaxity to varus stressing  He is neurovascularly intact to the right lower extremity and foot.  Knee Imaging: Recent AP weightbearing of both knees, as well as lateral and merchant views of the right knee are available for review and have been reviewed by myself. These films demonstrate severe degenerative changes, primarily involving the medial compartment with 90-100% medial joint space narrowing. Lesser degenerative changes of the medial  portion of the patellofemoral compartment also are noted. Overall alignment is mild varus. No fractures, lytic lesions, or abnormal calcifications are noted.  Assessment:  Primary osteoarthritis of right knee.   Plan: The treatment options were discussed with the patient and his wife. In addition, patient educational materials were provided regarding the diagnosis and treatment options. The patient is quite frustrated by his symptoms and functional limitations, and is ready to consider more aggressive treatment options. Therefore, I have recommended a surgical procedure, specifically a right partial knee replacement, especially since he has done so well from his left  partial knee replacement performed 5.5 years ago. The procedure was discussed with the patient, as were the potential risks (including bleeding, infection, nerve and/or blood vessel injury, persistent or recurrent pain, loosening and/or failure of the components, dislocation, need for further surgery, blood clots, strokes, heart attacks and/or arhythmias, pneumonia, etc.) and benefits. The patient states his/her understanding and wishes to proceed. All of the patient's questions and concerns were answered. He can call any time with further concerns. He will follow up post-surgery, routine.   H&P reviewed and patient re-examined. No changes.

## 2020-09-25 NOTE — Transfer of Care (Signed)
Immediate Anesthesia Transfer of Care Note  Patient: Allen Chang  Procedure(s) Performed: RIGHT PARTIAL KNEE REPLACEMENT (Right: Knee)  Patient Location: PACU  Anesthesia Type:General  Level of Consciousness: awake, drowsy and patient cooperative  Airway & Oxygen Therapy: Patient Spontanous Breathing  Post-op Assessment: Report given to RN and Post -op Vital signs reviewed and stable  Post vital signs: Reviewed and stable  Last Vitals:  Vitals Value Taken Time  BP 90/56 09/25/20 1000  Temp    Pulse 58 09/25/20 1001  Resp 15 09/25/20 1001  SpO2 94 % 09/25/20 1001  Vitals shown include unvalidated device data.  Last Pain:  Vitals:   09/25/20 0616  TempSrc: Oral         Complications: No notable events documented.

## 2020-09-25 NOTE — Anesthesia Postprocedure Evaluation (Signed)
Anesthesia Post Note  Patient: Allen Chang  Procedure(s) Performed: RIGHT PARTIAL KNEE REPLACEMENT (Right: Knee)  Patient location during evaluation: PACU Anesthesia Type: Spinal Level of consciousness: awake and alert Pain management: pain level controlled Vital Signs Assessment: post-procedure vital signs reviewed and stable Respiratory status: spontaneous breathing, nonlabored ventilation, respiratory function stable and patient connected to nasal cannula oxygen Cardiovascular status: blood pressure returned to baseline and stable Postop Assessment: no apparent nausea or vomiting Anesthetic complications: no   No notable events documented.   Last Vitals:  Vitals:   09/25/20 1045 09/25/20 1121  BP: 126/64 (!) 142/66  Pulse: (!) 49 (!) 45  Resp: 16 16  Temp: 36.8 C 36.6 C  SpO2: 99% 99%    Last Pain:  Vitals:   09/25/20 1030  TempSrc:   PainSc: 0-No pain                 Molli Barrows

## 2020-09-25 NOTE — Plan of Care (Signed)
  Problem: Acute Rehab PT Goals(only PT should resolve) Goal: Pt Will Go Supine/Side To Sit Outcome: Progressing Flowsheets (Taken 09/25/2020 1736) Pt will go Supine/Side to Sit: with modified independence Note: To increase ease with functional mobility   Problem: Acute Rehab PT Goals(only PT should resolve) Goal: Patient Will Transfer Sit To/From Stand Outcome: Progressing Flowsheets (Taken 09/25/2020 1736) Patient will transfer sit to/from stand: with supervision Note: To increase safety with transfers   Problem: Acute Rehab PT Goals(only PT should resolve) Goal: Pt Will Ambulate Outcome: Progressing Flowsheets (Taken 09/25/2020 1736) Pt will Ambulate:  75 feet  with least restrictive assistive device  with min guard assist Note: To demonstrate ability to ambulate from bedroom to bathroom   Problem: Acute Rehab PT Goals(only PT should resolve) Goal: Pt Will Go Up/Down Stairs Flowsheets (Taken 09/25/2020 1736) Pt will Go Up / Down Stairs:  3-5 stairs  with min guard assist Note: In order to demonstrate ability to safely enter/exit his home

## 2020-09-25 NOTE — Anesthesia Preprocedure Evaluation (Addendum)
Anesthesia Evaluation  Patient identified by MRN, date of birth, ID band Patient awake    Reviewed: Allergy & Precautions, H&P , NPO status , Patient's Chart, lab work & pertinent test results, reviewed documented beta blocker date and time   History of Anesthesia Complications (+) history of anesthetic complications  Airway Mallampati: II   Neck ROM: full    Dental  (+) Poor Dentition, Teeth Intact   Pulmonary neg pulmonary ROS,    Pulmonary exam normal        Cardiovascular Exercise Tolerance: Poor hypertension, Normal cardiovascular exam+ dysrhythmias  Rhythm:regular Rate:Normal     Neuro/Psych negative neurological ROS  negative psych ROS   GI/Hepatic Neg liver ROS, hiatal hernia, GERD  ,  Endo/Other  diabetesHypothyroidism   Renal/GU Renal disease  negative genitourinary   Musculoskeletal   Abdominal   Peds  Hematology  (+) Blood dyscrasia, anemia ,   Anesthesia Other Findings Past Medical History: No date: Cancer [facility])     Comment:  prostate  10/01/2016: CKD (chronic kidney disease) stage 3, GFR 30-59 ml/min  (HCC) No date: Complication of anesthesia     Comment:  bradycardia, in ICU for 24 hour after galbladder               surgery.  2012: Diverticulosis No date: Dysrhythmia     Comment:  Heart skips a beat No date: Elevated lipids No date: GERD (gastroesophageal reflux disease) No date: History of hiatal hernia No date: Hypertension No date: Hypothyroidism No date: Leukemia ([place]) No date: Prostate cancer ([place]) No date: Skin cancer     Comment:  face, scalp, behind ear,back and hand No date: Tremors of nervous system Past Surgical History: No date: BLADDER TUMOR EXCISION No date: CARDIAC CATHETERIZATION 10/10/2014: CHOLECYSTECTOMY; N/A     Comment:  Procedure: LAPAROSCOPIC CHOLECYSTECTOMY WITH               INTRAOPERATIVE CHOLANGIOGRAM;  Surgeon: Leonie Green, MD;   Location: [facility] ORS;  Service: General;                Laterality: N/A; 02/16/2017: COLONOSCOPY WITH PROPOFOL; N/A     Comment:  Procedure: COLONOSCOPY WITH PROPOFOL;  Surgeon: Manya Silvas, MD;  Location: [facility] ENDOSCOPY;  Service:               Endoscopy;  Laterality: N/A; 02/16/2017: ESOPHAGOGASTRODUODENOSCOPY (EGD) WITH PROPOFOL; N/A     Comment:  Procedure: ESOPHAGOGASTRODUODENOSCOPY (EGD) WITH               PROPOFOL;  Surgeon: Manya Silvas, MD;  Location:               [facility] ENDOSCOPY;  Service: Endoscopy;  Laterality: N/A; No date: HEMORRHOID SURGERY No date: HERNIA REPAIR; Left     Comment:  x2 No date: JOINT REPLACEMENT; Left     Comment:  Partial Knee Replacement, Dr. Roland Rack 12/12/2014: PARTIAL KNEE ARTHROPLASTY; Left     Comment:  Procedure: UNICOMPARTMENTAL KNEE;  Surgeon: Corky Mull, MD;  Location: [facility] ORS;  Service: Orthopedics;                Laterality: Left; No date: prostate seeding No date: SHOULDER ARTHROSCOPY; Right 02/07/2016: SHOULDER ARTHROSCOPY WITH OPEN ROTATOR CUFF  REPAIR; Left     Comment:  Procedure: SHOULDER ARTHROSCOPY WITH OPEN ROTATOR CUFF               REPAIR;  Surgeon: Corky Mull, MD;  Location: [facility] ORS;               Service: Orthopedics;  Laterality: Left;   Reproductive/Obstetrics negative OB ROS                             Anesthesia Physical Anesthesia Plan  ASA: 3  Anesthesia Plan: Spinal   Post-op Pain Management:    Induction:   PONV Risk Score and Plan: 3  Airway Management Planned:   Additional Equipment:   Intra-op Plan:   Post-operative Plan:   Informed Consent: I have reviewed the patients History and Physical, chart, labs and discussed the procedure including the risks, benefits and alternatives for the proposed anesthesia with the patient or authorized representative who has indicated his/her understanding and acceptance.     Dental Advisory  Given  Plan Discussed with: CRNA  Anesthesia Plan Comments:        Anesthesia Quick Evaluation

## 2020-09-25 NOTE — Anesthesia Procedure Notes (Signed)
Procedure Name: General with mask airway Date/Time: 09/25/2020 8:09 AM Performed by: Kelton Pillar, CRNA Pre-anesthesia Checklist: Patient identified, Emergency Drugs available, Suction available and Patient being monitored Patient Re-evaluated:Patient Re-evaluated prior to induction Oxygen Delivery Method: Simple face mask Induction Type: IV induction Placement Confirmation: positive ETCO2 and CO2 detector Dental Injury: Teeth and Oropharynx as per pre-operative assessment

## 2020-09-26 DIAGNOSIS — M1711 Unilateral primary osteoarthritis, right knee: Secondary | ICD-10-CM | POA: Diagnosis not present

## 2020-09-26 MED ORDER — TRAMADOL HCL 50 MG PO TABS: 50.0000 mg | ORAL_TABLET | Freq: Three times a day (TID) | ORAL | 0 refills | Status: DC | PRN

## 2020-09-26 MED ORDER — ENOXAPARIN SODIUM 40 MG/0.4ML IJ SOSY: 40.0000 mg | PREFILLED_SYRINGE | INTRAMUSCULAR | 0 refills | Status: DC

## 2020-09-26 MED ORDER — ONDANSETRON HCL 4 MG PO TABS: 4.0000 mg | ORAL_TABLET | Freq: Four times a day (QID) | ORAL | 0 refills | Status: DC | PRN

## 2020-09-26 MED ORDER — HYDROCODONE-ACETAMINOPHEN 5-325 MG PO TABS: 1.0000 | ORAL_TABLET | Freq: Four times a day (QID) | ORAL | 0 refills | Status: DC | PRN

## 2020-09-26 NOTE — Progress Notes (Signed)
OT Cancellation Note  Patient Details Name: Allen Chang MRN: [id number] DOB: [birth year]   Cancelled Treatment:    Reason Eval/Treat Not Completed: OT screened, no needs identified, will sign off. Orders received, pt noted to be preparing for d/c. Per conversation with PT, pt has no skilled acute OT needs. Will sign off. Please re-consult if new needs arise.   Dessie Coma, M.S. OTR/L  09/26/20, 1:08 PM  ascom [phone]

## 2020-09-26 NOTE — Plan of Care (Signed)

## 2020-09-26 NOTE — Progress Notes (Signed)
Pt discharged home with wife, all instructions provided and questions answered.   BP (!) 155/68 (BP Location: Right Arm)   Pulse (!) 54   Temp 98 F (36.7 C)   Resp 16   Ht '5\' 9"'$  (1.753 m)   Wt 78.8 kg   SpO2 100%   BMI 25.65 kg/m

## 2020-09-26 NOTE — Progress Notes (Signed)
Physical Therapy Treatment Patient Details Name: Allen Chang MRN: [id number] DOB: [birth year] Today's Date: 09/26/2020    History of Present Illness Pt is an 87 y/o male with hx of progressively worsening medial sided R knee pain who is now s/p partial R knee replacement as of 09/25/2020. PMH includes anemia, asthma, CA (prostate), CKD stage 3, GERD, hiatal hernia, HLD, HTN, IDA (2018), tremor, OA of B knees   Pt with previous L partial knee replacement (2016) where he successfully completed [facility]. Prior to R partial knee replacement pt was ambulating with a SPC for limited distances (a few hundred feet, per pt) d/t knee pain, but reports was otherwise independent with ADLs. Pt is a retired Psychologist, sport and exercise. He lives with spouse with grandkids nearby who can help him with mobility needs/ADLs.    PT Comments    Pt was sitting in recliner upon arriving. He agrees to session and is cooperative and motivated throughout. Easily able to stand, ambulate, and perform stairs without physical assistance. AROM 2-112 degrees. Pt spouse educated on safety concerns however Pryor Curia feels pt is safe to DC home with HHPT. MD/RN aware of pt's abilities. Recommend continued skilled PT going forward to address deficits while assisting pt to PLOF.      Follow Up Recommendations  Home health PT     Equipment Recommendations  None recommended by PT (pt and spouse confirm no equipment needs)       Precautions / Restrictions Precautions Precautions: Knee Precaution Booklet Issued: Yes (comment) Precaution Comments: WBAT RLE Restrictions Weight Bearing Restrictions: Yes RLE Weight Bearing: Weight bearing as tolerated    Mobility  Bed Mobility    General bed mobility comments: pt was in recliner pre/post session    Transfers Overall transfer level: Needs assistance Equipment used: Rolling walker (2 wheeled) Transfers: Sit to/from Stand Sit to Stand: Supervision         General transfer comment: no physical  assistance or vcs to safely STS form recliner.  Ambulation/Gait Ambulation/Gait assistance: Supervision Gait Distance (Feet): 220 Feet Assistive device: Rolling walker (2 wheeled) Gait Pattern/deviations: Step-through pattern Gait velocity: WNL   General Gait Details: pt was easily able and safely able to ambulate with RW without LOB or safety concern.   Stairs Stairs: Yes Stairs assistance: Supervision Stair Management: One rail Right;Sideways;Step to pattern Number of Stairs: 4 General stair comments: pt demonstrated safe ability to ascend/descend stairs without difficulty or safety concern    Balance Overall balance assessment: Needs assistance Sitting-balance support: No upper extremity supported;Feet supported Sitting balance-Leahy Scale: Good     Standing balance support: Bilateral upper extremity supported Standing balance-Leahy Scale: Good Standing balance comment: no LOB throughout session with and without use of AD        Cognition Arousal/Alertness: Awake/alert Behavior During Therapy: WFL for tasks assessed/performed Overall Cognitive Status: Within Functional Limits for tasks assessed      General Comments: Pt is A and O x 4      Exercises Total Joint Exercises Ankle Circles/Pumps: AROM;10 reps Quad Sets: AROM;10 reps Goniometric ROM: 2-112 degrees        Pertinent Vitals/Pain Pain Assessment: No/denies pain Pain Score: 0-No pain Pain Location: R medial knee Pain Intervention(s): Limited activity within patient's tolerance;Monitored during session;Premedicated before session;Repositioned     PT Goals (current goals can now be found in the care plan section) Acute Rehab PT Goals Patient Stated Goal: to go home Progress towards PT goals: Progressing toward goals    Frequency  BID      PT Plan Current plan remains appropriate       AM-PAC PT "6 Clicks" Mobility   Outcome Measure  Help needed turning from your back to your side  while in a flat bed without using bedrails?: A Little Help needed moving from lying on your back to sitting on the side of a flat bed without using bedrails?: A Little Help needed moving to and from a bed to a chair (including a wheelchair)?: A Little Help needed standing up from a chair using your arms (e.g., wheelchair or bedside chair)?: A Little Help needed to walk in hospital room?: A Little Help needed climbing 3-5 steps with a railing? : A Little 6 Click Score: 18    End of Session Equipment Utilized During Treatment: Gait belt Activity Tolerance: Patient tolerated treatment well;No increased pain Patient left: in chair;with call bell/phone within reach;with chair alarm set;with family/visitor present;with SCD's reapplied Nurse Communication: Mobility status PT Visit Diagnosis: Other abnormalities of gait and mobility (R26.89);Muscle weakness (generalized) (M62.81);History of falling (Z91.81);Pain;Unsteadiness on feet (R26.81) Pain - Right/Left: Right Pain - part of body: Knee     Time: [id number] PT Time Calculation (min) (ACUTE ONLY): 25 min  Charges:  $Gait Training: 8-22 mins $Therapeutic Exercise: 8-22 mins                    Julaine Fusi PTA 09/26/20, 10:52 AM

## 2020-09-26 NOTE — Discharge Instructions (Signed)

## 2020-09-26 NOTE — Plan of Care (Signed)
Pt anticipates discharge. No acute events overnight. He used CPM for 4 hrs. Problem: Education: Goal: Knowledge of General Education information will improve Description: Including pain rating scale, medication(s)/side effects and non-pharmacologic comfort measures Outcome: Progressing   Problem: Health Behavior/Discharge Planning: Goal: Ability to manage health-related needs will improve Outcome: Progressing   Problem: Clinical Measurements: Goal: Ability to maintain clinical measurements within normal limits will improve Outcome: Progressing Goal: Will remain free from infection Outcome: Progressing Goal: Diagnostic test results will improve Outcome: Progressing Goal: Respiratory complications will improve Outcome: Progressing Goal: Cardiovascular complication will be avoided Outcome: Progressing   Problem: Activity: Goal: Risk for activity intolerance will decrease Outcome: Progressing   Problem: Nutrition: Goal: Adequate nutrition will be maintained Outcome: Progressing   Problem: Coping: Goal: Level of anxiety will decrease Outcome: Progressing   Problem: Elimination: Goal: Will not experience complications related to bowel motility Outcome: Progressing Goal: Will not experience complications related to urinary retention Outcome: Progressing   Problem: Pain Managment: Goal: General experience of comfort will improve Outcome: Progressing   Problem: Safety: Goal: Ability to remain free from injury will improve Outcome: Progressing   Problem: Skin Integrity: Goal: Risk for impaired skin integrity will decrease Outcome: Progressing   Problem: Education: Goal: Knowledge of the prescribed therapeutic regimen will improve Outcome: Progressing Goal: Individualized Educational Video(s) Outcome: Progressing   Problem: Activity: Goal: Ability to avoid complications of mobility impairment will improve Outcome: Progressing Goal: Range of joint motion will  improve Outcome: Progressing   Problem: Clinical Measurements: Goal: Postoperative complications will be avoided or minimized Outcome: Progressing   Problem: Pain Management: Goal: Pain level will decrease with appropriate interventions Outcome: Progressing   Problem: Skin Integrity: Goal: Will show signs of wound healing Outcome: Progressing

## 2020-09-26 NOTE — Discharge Summary (Signed)
Physician Discharge Summary  Patient ID: Allen Chang MRN: [id number] DOB/AGE: [birth year] 87 y.o.  Admit date: 09/25/2020 Discharge date: 09/26/2020  Admission Diagnoses:  Status post right partial knee replacement [Z96.651]  Discharge Diagnoses: Patient Active Problem List   Diagnosis Date Noted   Status post right partial knee replacement 09/25/2020   Lower GI bleed 05/03/2020   CKD (chronic kidney disease), stage III ([place]) 05/03/2020   Hiatal hernia 12/16/2019   Postural dizziness with presyncope 12/16/2019   New onset Atrial fibrillation with slow ventricular response ([place]) 12/16/2019   Chest pain 12/16/2019   Encounter for antineoplastic chemotherapy 10/22/2018   Goals of care, counseling/discussion 09/28/2018   Thrombocytopenia ([place]) 10/27/2016   Iron deficiency 10/27/2016   CLL (chronic lymphocytic leukemia) ([place]) 10/27/2016   Macrocytic anemia 10/27/2016   Acquired hypothyroidism 10/01/2016   Chronic kidney disease (CKD) stage G3a/A1, moderately decreased glomerular filtration rate (GFR) between 45-59 mL/min/1.73 square meter and albuminuria creatinine ratio less than 30 mg/g (HCC) 10/01/2016   Status post left partial knee replacement 12/12/2014   Bradycardia 10/10/2014   Severe sinus bradycardia 10/10/2014   Benign essential hypertension 05/02/2013   Diabetes mellitus ([place]) 05/02/2013   Hyperlipidemia, unspecified 05/02/2013    Past Medical History:  Diagnosis Date   Cancer (HCC)    prostate    CKD (chronic kidney disease) stage 3, GFR 30-59 ml/min ([place]) 0000000   Complication of anesthesia    bradycardia, in ICU for 24 hour after galbladder surgery.    Diverticulosis 2012   Dysrhythmia    Heart skips a beat   Elevated lipids    GERD (gastroesophageal reflux disease)    History of hiatal hernia    Hypertension    Hypothyroidism    Leukemia (HCC)    Prostate cancer (HCC)    Skin cancer    face, scalp, behind ear,back and hand   Tremors of nervous  system      Transfusion: None.   Consultants (if any):   Discharged Condition: Improved  Hospital Course: Allen Chang is an 87 y.o. male who was admitted 09/25/2020 with a diagnosis of osteoarthritis of the medial compartment of the right knee and went to the operating room on 09/25/2020 and underwent the above named procedures.    Surgeries: Procedure(s): RIGHT PARTIAL KNEE REPLACEMENT on 09/25/2020 Patient tolerated the surgery well. Taken to PACU where she was stabilized and then transferred to the orthopedic floor.  Started on Lovenox '40mg'$  q 24 hrs. Foot pumps applied bilaterally at 80 mm. Heels elevated on bed with rolled towels. No evidence of DVT. Negative [place]. Physical therapy started on day #1 for gait training and transfer. OT started day #1 for ADL and assisted devices.  Patient's IV was removed on POD1.  Implants: All-cemented [employer] [place] system with a small femoral component, a "B" sized tibial tray, and a 6 mm meniscal bearing insert.  He was given perioperative antibiotics:  Anti-infectives (From admission, onward)    Start     Dose/Rate Route Frequency Ordered Stop   09/25/20 1330  ceFAZolin (ANCEF) IVPB 2g/100 mL premix        2 g 200 mL/hr over 30 Minutes Intravenous Every 6 hours 09/25/20 1119 09/26/20 0253   09/25/20 0705  ceFAZolin (ANCEF) 2-4 GM/100ML-% IVPB       Note to Pharmacy: Jordan Hawks  : cabinet override      09/25/20 0705 09/25/20 1003   09/25/20 0600  ceFAZolin (ANCEF) IVPB 2g/100 mL premix  2 g 200 mL/hr over 30 Minutes Intravenous On call to O.R. 09/25/20 0211 09/25/20 0747     .  He was given sequential compression devices, early ambulation, and Lovenox for DVT prophylaxis.  He benefited maximally from the hospital stay and there were no complications.    Recent vital signs:  Vitals:   09/25/20 2352 09/26/20 0421  BP: (!) 141/74 (!) 141/73  Pulse: (!) 55 (!) 53  Resp: 20 19  Temp: 97.7 F (36.5 C) 97.8 F (36.6 C)   SpO2: 97% 97%    Recent laboratory studies:  Lab Results  Component Value Date   HGB 13.1 07/06/2020   HGB 12.6 (L) 05/05/2020   HGB 13.0 05/04/2020   Lab Results  Component Value Date   WBC 7.8 07/06/2020   PLT 130 (L) 07/06/2020   Lab Results  Component Value Date   INR 1.0 05/03/2020   Lab Results  Component Value Date   NA 140 07/06/2020   K 4.0 07/06/2020   CL 103 07/06/2020   CO2 28 07/06/2020   BUN 18 07/06/2020   CREATININE 1.21 07/06/2020   GLUCOSE 104 (H) 07/06/2020    Discharge Medications:   Allergies as of 09/26/2020       Reactions   Oxycodone-acetaminophen Other (See Comments)   Other reaction(s): Hallucinations   Voltaren [diclofenac Sodium] Palpitations        Medication List     TAKE these medications    acetaminophen 500 MG tablet Commonly known as: TYLENOL Take 1,000 mg by mouth every 8 (eight) hours as needed for moderate pain.   ascorbic acid 1000 MG tablet Commonly known as: VITAMIN C Take 1,000 mg by mouth daily.   b complex vitamins capsule Take 1 capsule by mouth daily.   Black Cherry Concentrate Liqd Take 15 mLs by mouth daily.   MISC NATURAL PRODUCTS PO Take 2 capsules by mouth daily. Beet juice capsules   calcium carbonate 1500 (600 Ca) MG Tabs tablet Commonly known as: OSCAL Take 1,500 mg by mouth daily with breakfast.   Cholecalciferol 25 MCG (1000 UT) tablet Take 1,000 Units by mouth daily.   donepezil 10 MG tablet Commonly known as: ARICEPT Take 10 mg by mouth at bedtime.   DULoxetine 20 MG capsule Commonly known as: CYMBALTA Take 20 mg by mouth daily.   enoxaparin 40 MG/0.4ML injection Commonly known as: LOVENOX Inject 0.4 mLs (40 mg total) into the skin daily.   famotidine 40 MG tablet Commonly known as: PEPCID Take 40 mg by mouth 2 (two) times daily.   ferrous sulfate 325 (65 FE) MG EC tablet Take 325 mg by mouth 3 (three) times daily with meals.   Fish Oil 1000 MG Caps Take 1 capsule by  mouth daily.   Flax Seed Oil 1000 MG Caps Take 1,000 mg by mouth daily.   gabapentin 100 MG capsule Commonly known as: NEURONTIN Take 100 mg by mouth 3 (three) times daily.   Garlic 123XX123 MG Caps Take 1 capsule by mouth every morning.   HYDROcodone-acetaminophen 5-325 MG tablet Commonly known as: NORCO/VICODIN Take 1 tablet by mouth every 6 (six) hours as needed for moderate pain (pain score 4-6).   latanoprost 0.005 % ophthalmic solution Commonly known as: XALATAN Place 1 drop into both eyes at bedtime.   levothyroxine 50 MCG tablet Commonly known as: SYNTHROID Take 50 mcg by mouth daily before breakfast. Take on an empty stomach with a glass of water at least 30-60 minutes before breakfast.  loratadine 10 MG dissolvable tablet Commonly known as: CLARITIN REDITABS Take 10 mg by mouth daily. In am   ondansetron 4 MG tablet Commonly known as: ZOFRAN Take 1 tablet (4 mg total) by mouth every 6 (six) hours as needed for nausea.   OVER THE COUNTER MEDICATION Take 1 capsule by mouth daily. Neuriva   OVER THE COUNTER MEDICATION Take 1 capsule by mouth daily. Ageless Brain   primidone 50 MG tablet Commonly known as: MYSOLINE Take 50 mg by mouth 2 (two) times daily.   propranolol 10 MG tablet Commonly known as: INDERAL Take 10 mg by mouth 2 (two) times daily.   traMADol 50 MG tablet Commonly known as: ULTRAM Take 1 tablet (50 mg total) by mouth every 8 (eight) hours as needed for moderate pain.   vitamin E 180 MG (400 UNITS) capsule Take 400 Units by mouth daily.   zinc gluconate 50 MG tablet Take 50 mg by mouth daily.               Durable Medical Equipment  (From admission, onward)           Start     Ordered   09/25/20 1120  DME Bedside commode  Once       Question:  Patient needs a bedside commode to treat with the following condition  Answer:  Status post right partial knee replacement   09/25/20 1119   09/25/20 1120  DME 3 n 1  Once         09/25/20 1119   09/25/20 1120  DME Walker rolling  Once       Question Answer Comment  Walker: With 5 Inch Wheels   Patient needs a walker to treat with the following condition Status post right partial knee replacement      09/25/20 1119            Diagnostic Studies: DG Knee Right Port  Result Date: 09/25/2020 CLINICAL DATA:  Status post right partial knee replacement. EXAM: PORTABLE RIGHT KNEE - 1-2 VIEW COMPARISON:  None. FINDINGS: Sequelae of medial compartment arthroplasty are identified. Postoperative gas is noted in the knee joint, and skin staples are in place. No acute fracture or dislocation is identified. There is a superior patellar enthesophyte. IMPRESSION: Status post medial compartment arthroplasty. Electronically Signed   By: Logan Bores M.D.   On: 09/25/2020 12:44    Disposition: Plan for discharge home today with HHPT.     Follow-up Information     Lattie Corns, PA-C Follow up in 14 day(s).   Specialty: Physician Assistant Why: Electa Sniff information: [place] [address] [phone]                  Signed: Judson Roch 09/26/2020, 7:29 AM

## 2020-09-26 NOTE — Progress Notes (Signed)
  Subjective: 1 Day Post-Op Procedure(s) (LRB): RIGHT PARTIAL KNEE REPLACEMENT (Right) Patient reports pain as mild.   Patient is well, and has had no acute complaints or problems Plan is to go Home after hospital stay. Negative for chest pain and shortness of breath Fever: no Gastrointestinal:Negative for nausea and vomiting  Objective: Vital signs in last 24 hours: Temp:  [97.5 F (36.4 C)-98.6 F (37 C)] 97.8 F (36.6 C) (08/10 0421) Pulse Rate:  [45-56] 53 (08/10 0421) Resp:  [11-20] 19 (08/10 0421) BP: (90-154)/(56-74) 141/73 (08/10 0421) SpO2:  [93 %-100 %] 97 % (08/10 0421)  Intake/Output from previous day:  Intake/Output Summary (Last 24 hours) at 09/26/2020 0723 Last data filed at 09/26/2020 0000 Gross per 24 hour  Intake 1420 ml  Output 1230 ml  Net 190 ml    Intake/Output this shift: No intake/output data recorded.  Labs: No results for input(s): HGB in the last 72 hours. No results for input(s): WBC, RBC, HCT, PLT in the last 72 hours. No results for input(s): NA, K, CL, CO2, BUN, CREATININE, GLUCOSE, CALCIUM in the last 72 hours. No results for input(s): LABPT, INR in the last 72 hours.   EXAM General - Patient is Alert, Appropriate, and Oriented Extremity - ABD soft Sensation intact distally Intact pulses distally Dorsiflexion/Plantar flexion intact Incision: scant drainage No cellulitis present Dressing/Incision - Mild bloody drainage noted to the right knee honeycomb. Motor Function - intact, moving foot and toes well on exam.  Abdomen soft with normal bowel sounds this AM.  Past Medical History:  Diagnosis Date   Cancer ([place])    prostate    CKD (chronic kidney disease) stage 3, GFR 30-59 ml/min ([place]) 0000000   Complication of anesthesia    bradycardia, in ICU for 24 hour after galbladder surgery.    Diverticulosis 2012   Dysrhythmia    Heart skips a beat   Elevated lipids    GERD (gastroesophageal reflux disease)    History of hiatal  hernia    Hypertension    Hypothyroidism    Leukemia (HCC)    Prostate cancer (HCC)    Skin cancer    face, scalp, behind ear,back and hand   Tremors of nervous system     Assessment/Plan: 1 Day Post-Op Procedure(s) (LRB): RIGHT PARTIAL KNEE REPLACEMENT (Right) Active Problems:   Status post right partial knee replacement  Estimated body mass index is 25.65 kg/m as calculated from the following:   Height as of this encounter: '5\' 9"'$  (1.753 m).   Weight as of this encounter: 78.8 kg. Advance diet Up with therapy D/C IV fluids when tolerating po intake.  Vitals stable, reports minimal pain. Performed well with PT yesterday, has a few steps to clear to enter home. Up with therapy today. Plan for discharge home this afternoon pending progress with PT.  DVT Prophylaxis - Lovenox, Foot Pumps, and TED hose Weight-Bearing as tolerated to right leg  J. Cameron Proud, PA-C [facility] Orthopaedic Surgery 09/26/2020, 7:23 AM

## 2020-09-26 NOTE — TOC Progression Note (Signed)
Transition of Care [facility]) - Progression Note    Patient Details  Name: Allen Chang MRN: [id number] Date of Birth: Dec 29, 1932  [facility]) CM/SW Contact  Pete Pelt, RN Phone Number: 09/26/2020, 10:07 AM  Clinical Narrative:   Patient lives at home with his wife, who he states is able to assist him on discharge.  He has no concerns about transportation to appointments, or obtaining medications.  He reports that he takes medications as directed with no barriers.  He has no concerns with returning home after discharge, which he fully expects to happen today.  Patient has been set up with [facility] home [facility] prior to this admission, confirmed by [place], Warehouse manager at the agency.   TOC contact information given, TOC to follow for needs.           Expected Discharge Plan and Services                                                 Social Determinants of Health (SDOH) Interventions    Readmission Risk Interventions No flowsheet data found.

## 2020-10-05 ENCOUNTER — Inpatient Hospital Stay: Payer: Medicare Other | Attending: Oncology

## 2020-10-05 DIAGNOSIS — Z79899 Other long term (current) drug therapy: Secondary | ICD-10-CM | POA: Insufficient documentation

## 2020-10-05 DIAGNOSIS — I129 Hypertensive chronic kidney disease with stage 1 through stage 4 chronic kidney disease, or unspecified chronic kidney disease: Secondary | ICD-10-CM | POA: Diagnosis not present

## 2020-10-05 DIAGNOSIS — D472 Monoclonal gammopathy: Secondary | ICD-10-CM | POA: Diagnosis not present

## 2020-10-05 DIAGNOSIS — Z85828 Personal history of other malignant neoplasm of skin: Secondary | ICD-10-CM | POA: Insufficient documentation

## 2020-10-05 DIAGNOSIS — N1831 Chronic kidney disease, stage 3a: Secondary | ICD-10-CM | POA: Diagnosis not present

## 2020-10-05 DIAGNOSIS — Z8546 Personal history of malignant neoplasm of prostate: Secondary | ICD-10-CM | POA: Insufficient documentation

## 2020-10-05 DIAGNOSIS — C9110 Chronic lymphocytic leukemia of B-cell type not having achieved remission: Secondary | ICD-10-CM | POA: Diagnosis not present

## 2020-10-05 LAB — CBC WITH DIFFERENTIAL/PLATELET
Abs Immature Granulocytes: 0.08 10*3/uL — ABNORMAL HIGH (ref 0.00–0.07)
Basophils Absolute: 0.1 10*3/uL (ref 0.0–0.1)
Basophils Relative: 1 %
Eosinophils Absolute: 0.5 10*3/uL (ref 0.0–0.5)
Eosinophils Relative: 4 %
HCT: 36.7 % — ABNORMAL LOW (ref 39.0–52.0)
Hemoglobin: 11.9 g/dL — ABNORMAL LOW (ref 13.0–17.0)
Immature Granulocytes: 1 %
Lymphocytes Relative: 48 %
Lymphs Abs: 5 10*3/uL — ABNORMAL HIGH (ref 0.7–4.0)
MCH: 34.8 pg — ABNORMAL HIGH (ref 26.0–34.0)
MCHC: 32.4 g/dL (ref 30.0–36.0)
MCV: 107.3 fL — ABNORMAL HIGH (ref 80.0–100.0)
Monocytes Absolute: 0.8 10*3/uL (ref 0.1–1.0)
Monocytes Relative: 8 %
Neutro Abs: 3.9 10*3/uL (ref 1.7–7.7)
Neutrophils Relative %: 38 %
Platelets: 234 10*3/uL (ref 150–400)
RBC: 3.42 MIL/uL — ABNORMAL LOW (ref 4.22–5.81)
RDW: 12.3 % (ref 11.5–15.5)
WBC: 10.3 10*3/uL (ref 4.0–10.5)
nRBC: 0 % (ref 0.0–0.2)

## 2020-10-05 LAB — COMPREHENSIVE METABOLIC PANEL
ALT: 28 U/L (ref 0–44)
AST: 32 U/L (ref 15–41)
Albumin: 3.5 g/dL (ref 3.5–5.0)
Alkaline Phosphatase: 76 U/L (ref 38–126)
Anion gap: 10 (ref 5–15)
BUN: 24 mg/dL — ABNORMAL HIGH (ref 8–23)
CO2: 28 mmol/L (ref 22–32)
Calcium: 9.4 mg/dL (ref 8.9–10.3)
Chloride: 96 mmol/L — ABNORMAL LOW (ref 98–111)
Creatinine, Ser: 1.35 mg/dL — ABNORMAL HIGH (ref 0.61–1.24)
GFR, Estimated: 51 mL/min — ABNORMAL LOW (ref >60)
Glucose, Bld: 155 mg/dL — ABNORMAL HIGH (ref 70–99)
Potassium: 4.2 mmol/L (ref 3.5–5.1)
Sodium: 134 mmol/L — ABNORMAL LOW (ref 135–145)
Total Bilirubin: 0.7 mg/dL (ref 0.3–1.2)
Total Protein: 7.2 g/dL (ref 6.5–8.1)

## 2020-10-05 LAB — LACTATE DEHYDROGENASE: LDH: 120 U/L (ref 98–192)

## 2020-10-08 LAB — MULTIPLE MYELOMA PANEL, SERUM
Albumin SerPl Elph-Mcnc: 3.2 g/dL (ref 2.9–4.4)
Albumin/Glob SerPl: 1 (ref 0.7–1.7)
Alpha 1: 0.3 g/dL (ref 0.0–0.4)
Alpha2 Glob SerPl Elph-Mcnc: 1 g/dL (ref 0.4–1.0)
B-Globulin SerPl Elph-Mcnc: 0.9 g/dL (ref 0.7–1.3)
Gamma Glob SerPl Elph-Mcnc: 1.3 g/dL (ref 0.4–1.8)
Globulin, Total: 3.5 g/dL (ref 2.2–3.9)
IgA: 41 mg/dL — ABNORMAL LOW (ref 61–437)
IgG (Immunoglobin G), Serum: 1307 mg/dL (ref 603–1613)
IgM (Immunoglobulin M), Srm: 98 mg/dL (ref 15–143)
M Protein SerPl Elph-Mcnc: 1 g/dL — ABNORMAL HIGH
Total Protein ELP: 6.7 g/dL (ref 6.0–8.5)

## 2020-10-08 LAB — KAPPA/LAMBDA LIGHT CHAINS
Kappa free light chain: 38.1 mg/L — ABNORMAL HIGH (ref 3.3–19.4)
Kappa, lambda light chain ratio: 0.57 (ref 0.26–1.65)
Lambda free light chains: 66.8 mg/L — ABNORMAL HIGH (ref 5.7–26.3)

## 2020-10-12 ENCOUNTER — Inpatient Hospital Stay (HOSPITAL_BASED_OUTPATIENT_CLINIC_OR_DEPARTMENT_OTHER): Payer: Medicare Other | Admitting: Oncology

## 2020-10-12 ENCOUNTER — Encounter: Payer: Self-pay | Admitting: Oncology

## 2020-10-12 VITALS — BP 125/70 | HR 55 | Temp 97.8°F | Resp 20

## 2020-10-12 DIAGNOSIS — Z5111 Encounter for antineoplastic chemotherapy: Secondary | ICD-10-CM

## 2020-10-12 DIAGNOSIS — C9110 Chronic lymphocytic leukemia of B-cell type not having achieved remission: Secondary | ICD-10-CM

## 2020-10-12 DIAGNOSIS — D472 Monoclonal gammopathy: Secondary | ICD-10-CM | POA: Diagnosis not present

## 2020-10-12 DIAGNOSIS — N1831 Chronic kidney disease, stage 3a: Secondary | ICD-10-CM

## 2020-10-12 NOTE — Progress Notes (Signed)
Hematology/Oncology Follow Up Note [facility] [phone] [phone]  CONSULT NOTE Patient Care Team: Tracie Harrier, MD as PCP - General (Internal Medicine)  REASON FOR VISIT  follow up for CLL and iron deficiency anemia  HISTORY OF PRESENTING ILLNESS:  Allen Chang 87 y.o.  male with PMH listed as below who was referred by primary care provider to me for evaluation of leukocytosis. He has a history of prostate cancer which was diagnosed 10 years ago. He underwent brachytherapy. He is not any castration treatment. Per patient, he follows up with Dr.Wolf and most recent PSA is normal.   Patient also reports feeling fatigue lately. Recent labs show leukocytosis, mild anemia, macrocytosis, mild thrombocytopenia, low ferritin. Denies blood in the stool. He was started on over the counter iron supplement but wife who manages patient's medication decides to only let patient to take iron medication every other day.   # received IV iron Venofer x 4. Colonoscopy was done on 02/16/2017 which showed polyps, internal hemorrhoids, negative for malignancy.  ##Patient had bone marrow biopsy on 09/22/2018 Pathology showed hypercellular bone marrow with extensive involvement by chronic lymphocytic leukemia. # Started on acalabrutinib 100 mg twice daily on 10/01/2018. 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  #August 2020-acalabrutinib 100 mg twice daily Stopped in October 2021 during his admission.  # admitted from 12/16/19- 10/30/ 2021 due to sudden onset chest pain. Troponins were negative in the emergency room.  Initial EKG showed atrial fibrillation with heart rate of 55. Patient was seen by cardiology and clarified that the EKG and telemetry actually showed sinus rhythm with PACs patient did not require any anticoagulation.Echocardiogram showed normal EF and no wall motion abnormality.  Nuclear stress testing showed low risk study,  EF 63%. Patient was discharged home and followed up with Dr. Clayborn Bigness on January 26, 2020 Currently off of acalabrutinib since October 2021 admission.  INTERVAL HISTORY Allen Chang is a 87 y.o. male who has above history reviewed by me presents for 4 months follow up for CLL and iron deficiency anemia, MGUS Today patient was accompanied by his wife Patient recently underwent right knee replacement.  He has no new complaints.  Appetite is good.  Accompanied by his wife.   Review of Systems  Constitutional:  Negative for appetite change, chills, fatigue, fever and unexpected weight change.  HENT:   Negative for hearing loss and voice change.   Eyes:  Negative for eye problems and icterus.  Respiratory:  Negative for chest tightness, cough and shortness of breath.   Cardiovascular:  Negative for chest pain and leg swelling.  Gastrointestinal:  Negative for abdominal distention and abdominal pain.  Endocrine: Negative for hot flashes.  Genitourinary:  Negative for difficulty urinating, dysuria and frequency.   Musculoskeletal:  Positive for arthralgias. Negative for back pain.  Skin:  Negative for itching and rash.       Skin cancer  Neurological:  Negative for light-headedness and numbness.  Hematological:  Negative for adenopathy. Does not bruise/bleed easily.  Psychiatric/Behavioral:  Negative for confusion.    MEDICAL HISTORY:  Past Medical History:  Diagnosis Date   Cancer [facility])    prostate    CKD (chronic kidney disease) stage 3, GFR 30-59 ml/min (HCC) 0/24/0973   Complication of anesthesia    bradycardia, in ICU for 24 hour after galbladder surgery.    Diverticulosis 2012   Dysrhythmia    Heart skips a beat   Elevated lipids  GERD (gastroesophageal reflux disease)    History of hiatal hernia    Hypertension    Hypothyroidism    Leukemia (HCC)    Prostate cancer ([place])    Skin cancer    face, scalp, behind ear,back and hand   Tremors of nervous system      SURGICAL HISTORY: Past Surgical History:  Procedure Laterality Date   BLADDER TUMOR EXCISION     CARDIAC CATHETERIZATION     CHOLECYSTECTOMY N/A 10/10/2014   Procedure: LAPAROSCOPIC CHOLECYSTECTOMY WITH INTRAOPERATIVE CHOLANGIOGRAM;  Surgeon: Leonie Green, MD;  Location: [facility] ORS;  Service: General;  Laterality: N/A;   COLONOSCOPY WITH PROPOFOL N/A 02/16/2017   Procedure: COLONOSCOPY WITH PROPOFOL;  Surgeon: Manya Silvas, MD;  Location: [facility] ENDOSCOPY;  Service: Endoscopy;  Laterality: N/A;   ESOPHAGOGASTRODUODENOSCOPY (EGD) WITH PROPOFOL N/A 02/16/2017   Procedure: ESOPHAGOGASTRODUODENOSCOPY (EGD) WITH PROPOFOL;  Surgeon: Manya Silvas, MD;  Location: [facility] ENDOSCOPY;  Service: Endoscopy;  Laterality: N/A;   HEMORRHOID SURGERY     HERNIA REPAIR Left    x2   JOINT REPLACEMENT Left    Partial Knee Replacement, Dr. Roland Rack   PARTIAL KNEE ARTHROPLASTY Left 12/12/2014   Procedure: UNICOMPARTMENTAL KNEE;  Surgeon: Corky Mull, MD;  Location: [facility] ORS;  Service: Orthopedics;  Laterality: Left;   PARTIAL KNEE ARTHROPLASTY Right 09/25/2020   Procedure: RIGHT PARTIAL KNEE REPLACEMENT;  Surgeon: Corky Mull, MD;  Location: [facility] ORS;  Service: Orthopedics;  Laterality: Right;   prostate seeding     SHOULDER ARTHROSCOPY Right    SHOULDER ARTHROSCOPY WITH OPEN ROTATOR CUFF REPAIR Left 02/07/2016   Procedure: SHOULDER ARTHROSCOPY WITH OPEN ROTATOR CUFF REPAIR;  Surgeon: Corky Mull, MD;  Location: [facility] ORS;  Service: Orthopedics;  Laterality: Left;    SOCIAL HISTORY: Social History   Socioeconomic History   Marital status: Married    Spouse name: Not on file   Number of children: Not on file   Years of education: Not on file   Highest education level: Not on file  Occupational History   Not on file  Tobacco Use   Smoking status: Never   Smokeless tobacco: Never  Vaping Use   Vaping Use: Never used  Substance and Sexual Activity   Alcohol use: No   Drug use: No    Sexual activity: Yes  Other Topics Concern   Not on file  Social History Narrative   Not on file   Social Determinants of Health   Financial Resource Strain: Not on file  Food Insecurity: Not on file  Transportation Needs: Not on file  Physical Activity: Not on file  Stress: Not on file  Social Connections: Not on file  Intimate Partner Violence: Not on file    FAMILY HISTORY: Family History  Problem Relation Age of Onset   Bone cancer Father    Hypertension Mother    Osteoporosis Mother    Breast cancer Sister    Cancer Brother    Cancer Brother    Lung cancer Brother    Bladder Cancer Brother    Melanoma Brother     ALLERGIES:  is allergic to oxycodone-acetaminophen and voltaren [diclofenac sodium].  MEDICATIONS:  Current Outpatient Medications  Medication Sig Dispense Refill   acetaminophen (TYLENOL) 500 MG tablet Take 1,000 mg by mouth every 8 (eight) hours as needed for moderate pain.     ascorbic acid (VITAMIN C) 1000 MG tablet Take 1,000 mg by mouth daily.     b complex vitamins capsule  Take 1 capsule by mouth daily.     calcium carbonate (OSCAL) 1500 (600 Ca) MG TABS tablet Take 1,500 mg by mouth daily with breakfast.     Cholecalciferol 25 MCG (1000 UT) tablet Take 1,000 Units by mouth daily.     donepezil (ARICEPT) 10 MG tablet Take 10 mg by mouth at bedtime.     DULoxetine (CYMBALTA) 20 MG capsule Take 20 mg by mouth daily.     enoxaparin (LOVENOX) 40 MG/0.4ML injection Inject 0.4 mLs (40 mg total) into the skin daily. 5.6 mL 0   famotidine (PEPCID) 40 MG tablet Take 40 mg by mouth 2 (two) times daily.     ferrous sulfate 325 (65 FE) MG EC tablet Take 325 mg by mouth 3 (three) times daily with meals.     Flaxseed, Linseed, (FLAX SEED OIL) 1000 MG CAPS Take 1,000 mg by mouth daily.     gabapentin (NEURONTIN) 100 MG capsule Take 100 mg by mouth 3 (three) times daily.      HYDROcodone-acetaminophen (NORCO/VICODIN) 5-325 MG tablet Take 1 tablet by mouth every 6  (six) hours as needed for moderate pain (pain score 4-6). 30 tablet 0   latanoprost (XALATAN) 0.005 % ophthalmic solution Place 1 drop into both eyes at bedtime.     levothyroxine (SYNTHROID) 50 MCG tablet Take 50 mcg by mouth daily before breakfast. Take on an empty stomach with a glass of water at least 30-60 minutes before breakfast.     loratadine (CLARITIN REDITABS) 10 MG dissolvable tablet Take 10 mg by mouth daily. In am     Misc Natural Products (BLACK CHERRY CONCENTRATE) LIQD Take 15 mLs by mouth daily.      MISC NATURAL PRODUCTS PO Take 2 capsules by mouth daily. Beet juice capsules     ondansetron (ZOFRAN) 4 MG tablet Take 1 tablet (4 mg total) by mouth every 6 (six) hours as needed for nausea. 30 tablet 0   OVER THE COUNTER MEDICATION Take 1 capsule by mouth daily. Neuriva     OVER THE COUNTER MEDICATION Take 1 capsule by mouth daily. Ageless Brain     primidone (MYSOLINE) 50 MG tablet Take 50 mg by mouth 2 (two) times daily.     propranolol (INDERAL) 10 MG tablet Take 10 mg by mouth 2 (two) times daily.     traMADol (ULTRAM) 50 MG tablet Take 1 tablet (50 mg total) by mouth every 8 (eight) hours as needed for moderate pain. 30 tablet 0   vitamin E 180 MG (400 UNITS) capsule Take 400 Units by mouth daily.     zinc gluconate 50 MG tablet Take 50 mg by mouth daily.     Garlic 6378 MG CAPS Take 1 capsule by mouth every morning. (Patient not taking: Reported on 10/12/2020)     Omega-3 Fatty Acids (FISH OIL) 1000 MG CAPS Take 1 capsule by mouth daily. (Patient not taking: Reported on 10/12/2020)     No current facility-administered medications for this visit.      Marland Kitchen  PHYSICAL EXAMINATION: ECOG PERFORMANCE STATUS: 1 - Symptomatic but completely ambulatory Vitals:   10/12/20 1149  BP: 125/70  Pulse: (!) 55  Resp: 20  Temp: 97.8 F (36.6 C)  SpO2: 100%   There were no vitals filed for this visit.  Physical Exam Constitutional:      General: He is not in acute distress.     Appearance: He is not diaphoretic.     Comments: Patient sits in the wheelchair  HENT:     Head: Normocephalic and atraumatic.     Nose: Nose normal.     Mouth/Throat:     Pharynx: No oropharyngeal exudate.  Eyes:     General: No scleral icterus.    Pupils: Pupils are equal, round, and reactive to light.  Cardiovascular:     Rate and Rhythm: Normal rate. Rhythm irregular.     Heart sounds: No murmur heard. Pulmonary:     Effort: Pulmonary effort is normal. No respiratory distress.     Breath sounds: No rales.  Chest:     Chest wall: No tenderness.  Abdominal:     General: There is no distension.     Palpations: Abdomen is soft.     Tenderness: There is no abdominal tenderness.  Musculoskeletal:        General: Normal range of motion.     Cervical back: Normal range of motion and neck supple.     Comments: Status post right knee replacement  Skin:    General: Skin is warm and dry.     Findings: No erythema.  Neurological:     Mental Status: He is alert and oriented to person, place, and time.  Psychiatric:        Mood and Affect: Affect normal.  .    LABORATORY DATA:  I have reviewed the data as listed Lab Results  Component Value Date   WBC 10.3 10/05/2020   HGB 11.9 (L) 10/05/2020   HCT 36.7 (L) 10/05/2020   MCV 107.3 (H) 10/05/2020   PLT 234 10/05/2020   Recent Labs    11/10/19 1021 12/15/19 2006 05/04/20 0519 07/06/20 1111 10/05/20 1146  NA 139   < > 139 140 134*  K 4.4   < > 3.6 4.0 4.2  CL 102   < > 105 103 96*  CO2 29   < > '27 28 28  ' GLUCOSE 122*   < > 103* 104* 155*  BUN 26*   < > 16 18 24*  CREATININE 1.39*   < > 0.98 1.21 1.35*  CALCIUM 9.2   < > 8.6* 9.3 9.4  GFRNONAA 46*   < > >60 58* 51*  GFRAA 53*  --   --   --   --   PROT 7.0   < > 6.6 7.0 7.2  ALBUMIN 3.5   < > 3.6 3.8 3.5  AST 17   < > 23 21 32  ALT 15   < > '18 16 28  ' ALKPHOS 63   < > 65 68 76  BILITOT 0.8   < > 0.9 0.7 0.7   < > = values in this interval not displayed.      RADIOGRAPHIC STUDIES: I have personally reviewed the radiological images as listed and agreed with the findings in the report. no recent images. 11/04/2017  US abdomen Stable right renal cyst. Status post cholecystectomy.Previously seen lesion within the liver is not well appreciated on this exam. No Splenomegaly.    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  Cytogenetics revealed Trisomy 12 IGVH unmutated.  ASSESSMENT & PLAN:  1. CLL (chronic lymphocytic leukemia) ([place])   2. MGUS (monoclonal gammopathy of unknown significance)   3. Stage 3a chronic kidney disease ([place])   4. Encounter for antineoplastic chemotherapy    Per patient's request, I also called patient's daughter Anderson Malta and updated her. # CLL, intermediate risk with trisomy 12. IGVH unmutated. Stage III, extensive bone marrow involvement. Off acalabrutinib  100 mg twice daily Flow cytometry showed CLL population less than 5000 cells/ul. Continue watchful waiting.  Labs are reviewed and discussed with patient.   MGUS, IgG lambda and IgM kappa clones. 02/14/2020 M protein increased from 2.3 to1.1. 04/03/2020, M protein 1.0. 07/06/2020 M protein 0.9, IgG lambda and new IgM kappa clone 10/05/2020, M protein 1.0 IgG lambda and new IgM kappa clone Continue observation.  #CKD,  avoid nephrotoxins.  Encourage oral hydration.  Orders Placed This Encounter  Procedures   CBC with Differential/Platelet    Standing Status:   Future    Standing Expiration Date:   10/12/2021   Comprehensive metabolic panel    Standing Status:   Future    Standing Expiration Date:   10/12/2021   Lactate dehydrogenase    Standing Status:   Future    Standing Expiration Date:   10/12/2021   Kappa/lambda light chains    Standing Status:   Future    Standing Expiration Date:   10/12/2021   Multiple Myeloma Panel (SPEP&IFE w/QIG)    Standing Status:   Future    Standing Expiration Date:   10/12/2021   Flow cytometry  panel-leukemia/lymphoma work-up    Standing Status:   Future    Standing Expiration Date:   10/12/2021   Follow up in 3 months  [name], MD, PhD Hematology Oncology [place] [facility] 10/12/2020

## 2020-10-13 ENCOUNTER — Encounter: Payer: Self-pay | Admitting: Oncology

## 2020-10-18 ENCOUNTER — Telehealth: Payer: Self-pay | Admitting: Oncology

## 2020-10-18 NOTE — Telephone Encounter (Signed)
Rescheduled lab appointment with patient's wife. New appointment on 01/09/21 confirmed.

## 2021-01-09 ENCOUNTER — Inpatient Hospital Stay: Payer: Medicare Other | Attending: Oncology

## 2021-01-09 ENCOUNTER — Other Ambulatory Visit: Payer: Self-pay

## 2021-01-09 DIAGNOSIS — N1831 Chronic kidney disease, stage 3a: Secondary | ICD-10-CM | POA: Insufficient documentation

## 2021-01-09 DIAGNOSIS — D696 Thrombocytopenia, unspecified: Secondary | ICD-10-CM | POA: Insufficient documentation

## 2021-01-09 DIAGNOSIS — Z79899 Other long term (current) drug therapy: Secondary | ICD-10-CM | POA: Insufficient documentation

## 2021-01-09 DIAGNOSIS — D472 Monoclonal gammopathy: Secondary | ICD-10-CM | POA: Diagnosis not present

## 2021-01-09 DIAGNOSIS — C9110 Chronic lymphocytic leukemia of B-cell type not having achieved remission: Secondary | ICD-10-CM | POA: Diagnosis not present

## 2021-01-09 LAB — CBC WITH DIFFERENTIAL/PLATELET
Abs Immature Granulocytes: 0.03 10*3/uL (ref 0.00–0.07)
Basophils Absolute: 0.1 10*3/uL (ref 0.0–0.1)
Basophils Relative: 1 %
Eosinophils Absolute: 0.5 10*3/uL (ref 0.0–0.5)
Eosinophils Relative: 4 %
HCT: 39.9 % (ref 39.0–52.0)
Hemoglobin: 13.1 g/dL (ref 13.0–17.0)
Immature Granulocytes: 0 %
Lymphocytes Relative: 56 %
Lymphs Abs: 6.5 10*3/uL — ABNORMAL HIGH (ref 0.7–4.0)
MCH: 34.9 pg — ABNORMAL HIGH (ref 26.0–34.0)
MCHC: 32.8 g/dL (ref 30.0–36.0)
MCV: 106.4 fL — ABNORMAL HIGH (ref 80.0–100.0)
Monocytes Absolute: 1.7 10*3/uL — ABNORMAL HIGH (ref 0.1–1.0)
Monocytes Relative: 14 %
Neutro Abs: 2.9 10*3/uL (ref 1.7–7.7)
Neutrophils Relative %: 25 %
Platelets: 130 10*3/uL — ABNORMAL LOW (ref 150–400)
RBC: 3.75 MIL/uL — ABNORMAL LOW (ref 4.22–5.81)
RDW: 12.1 % (ref 11.5–15.5)
Smear Review: NORMAL
WBC: 11.7 10*3/uL — ABNORMAL HIGH (ref 4.0–10.5)
nRBC: 0 % (ref 0.0–0.2)

## 2021-01-09 LAB — COMPREHENSIVE METABOLIC PANEL
ALT: 16 U/L (ref 0–44)
AST: 20 U/L (ref 15–41)
Albumin: 4 g/dL (ref 3.5–5.0)
Alkaline Phosphatase: 102 U/L (ref 38–126)
Anion gap: 8 (ref 5–15)
BUN: 17 mg/dL (ref 8–23)
CO2: 31 mmol/L (ref 22–32)
Calcium: 9.6 mg/dL (ref 8.9–10.3)
Chloride: 97 mmol/L — ABNORMAL LOW (ref 98–111)
Creatinine, Ser: 1.12 mg/dL (ref 0.61–1.24)
GFR, Estimated: >60 mL/min (ref >60)
Glucose, Bld: 114 mg/dL — ABNORMAL HIGH (ref 70–99)
Potassium: 4.4 mmol/L (ref 3.5–5.1)
Sodium: 136 mmol/L (ref 135–145)
Total Bilirubin: 0.4 mg/dL (ref 0.3–1.2)
Total Protein: 7.2 g/dL (ref 6.5–8.1)

## 2021-01-09 LAB — LACTATE DEHYDROGENASE: LDH: 107 U/L (ref 98–192)

## 2021-01-11 ENCOUNTER — Other Ambulatory Visit: Payer: Medicare Other

## 2021-01-11 LAB — KAPPA/LAMBDA LIGHT CHAINS
Kappa free light chain: 45.6 mg/L — ABNORMAL HIGH (ref 3.3–19.4)
Kappa, lambda light chain ratio: 0.79 (ref 0.26–1.65)
Lambda free light chains: 57.7 mg/L — ABNORMAL HIGH (ref 5.7–26.3)

## 2021-01-14 LAB — MULTIPLE MYELOMA PANEL, SERUM
Albumin SerPl Elph-Mcnc: 3.7 g/dL (ref 2.9–4.4)
Albumin/Glob SerPl: 1.2 (ref 0.7–1.7)
Alpha 1: 0.2 g/dL (ref 0.0–0.4)
Alpha2 Glob SerPl Elph-Mcnc: 0.8 g/dL (ref 0.4–1.0)
B-Globulin SerPl Elph-Mcnc: 0.9 g/dL (ref 0.7–1.3)
Gamma Glob SerPl Elph-Mcnc: 1.3 g/dL (ref 0.4–1.8)
Globulin, Total: 3.1 g/dL (ref 2.2–3.9)
IgA: 37 mg/dL — ABNORMAL LOW (ref 61–437)
IgG (Immunoglobin G), Serum: 1283 mg/dL (ref 603–1613)
IgM (Immunoglobulin M), Srm: 108 mg/dL (ref 15–143)
M Protein SerPl Elph-Mcnc: 1 g/dL — ABNORMAL HIGH
Total Protein ELP: 6.8 g/dL (ref 6.0–8.5)

## 2021-01-14 LAB — COMP PANEL: LEUKEMIA/LYMPHOMA: Immunophenotypic Profile: 48

## 2021-01-18 ENCOUNTER — Encounter: Payer: Self-pay | Admitting: Oncology

## 2021-01-18 ENCOUNTER — Inpatient Hospital Stay: Payer: Medicare Other | Attending: Oncology | Admitting: Oncology

## 2021-01-18 ENCOUNTER — Other Ambulatory Visit: Payer: Self-pay

## 2021-01-18 VITALS — BP 162/75 | HR 56 | Temp 97.0°F | Resp 18 | Wt 180.1 lb

## 2021-01-18 DIAGNOSIS — C9110 Chronic lymphocytic leukemia of B-cell type not having achieved remission: Secondary | ICD-10-CM | POA: Diagnosis not present

## 2021-01-18 DIAGNOSIS — N1831 Chronic kidney disease, stage 3a: Secondary | ICD-10-CM

## 2021-01-18 DIAGNOSIS — Z9049 Acquired absence of other specified parts of digestive tract: Secondary | ICD-10-CM | POA: Diagnosis not present

## 2021-01-18 DIAGNOSIS — Z79899 Other long term (current) drug therapy: Secondary | ICD-10-CM | POA: Diagnosis not present

## 2021-01-18 DIAGNOSIS — Z7901 Long term (current) use of anticoagulants: Secondary | ICD-10-CM | POA: Insufficient documentation

## 2021-01-18 DIAGNOSIS — D472 Monoclonal gammopathy: Secondary | ICD-10-CM

## 2021-01-18 DIAGNOSIS — Z8546 Personal history of malignant neoplasm of prostate: Secondary | ICD-10-CM | POA: Insufficient documentation

## 2021-01-18 DIAGNOSIS — D509 Iron deficiency anemia, unspecified: Secondary | ICD-10-CM | POA: Diagnosis present

## 2021-01-18 DIAGNOSIS — D696 Thrombocytopenia, unspecified: Secondary | ICD-10-CM | POA: Diagnosis not present

## 2021-01-18 DIAGNOSIS — Z5111 Encounter for antineoplastic chemotherapy: Secondary | ICD-10-CM

## 2021-01-18 NOTE — Progress Notes (Signed)
Pt here for follow up. No new concerns voiced.   

## 2021-01-18 NOTE — Progress Notes (Signed)
Hematology/Oncology Follow Up Note [phone]   CONSULT NOTE Patient Care Team: Tracie Harrier, MD as PCP - General (Internal Medicine)  REASON FOR VISIT  follow up for CLL and iron deficiency anemia  HISTORY OF PRESENTING ILLNESS:  Allen Chang 87 y.o.  male with PMH listed as below who was referred by primary care provider to me for evaluation of leukocytosis. He has a history of prostate cancer which was diagnosed 10 years ago. He underwent brachytherapy. He is not any castration treatment. Per patient, he follows up with Dr.Wolf and most recent PSA is normal.   Patient also reports feeling fatigue lately. Recent labs show leukocytosis, mild anemia, macrocytosis, mild thrombocytopenia, low ferritin. Denies blood in the stool. He was started on over the counter iron supplement but wife who manages patient's medication decides to only let patient to take iron medication every other day.   # received IV iron Venofer x 4. Colonoscopy was done on 02/16/2017 which showed polyps, internal hemorrhoids, negative for malignancy.  ##Patient had bone marrow biopsy on 09/22/2018 Pathology showed hypercellular bone marrow with extensive involvement by chronic lymphocytic leukemia. # Started on acalabrutinib 100 mg twice daily on 10/01/2018. 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  #August 2020-acalabrutinib 100 mg twice daily Stopped in October 2021 during his admission.  # admitted from 12/16/19- 10/30/ 2021 due to sudden onset chest pain. Troponins were negative in the emergency room.  Initial EKG showed atrial fibrillation with heart rate of 55. Patient was seen by cardiology and clarified that the EKG and telemetry actually showed sinus rhythm with PACs patient did not require any anticoagulation.Echocardiogram showed normal EF and no wall motion abnormality.  Nuclear stress testing showed low risk study, EF 63%. Patient was discharged home and followed up  with Dr. Clayborn Bigness on January 26, 2020 Currently off of acalabrutinib since October 2021 admission.  INTERVAL HISTORY Allen Chang is a 87 y.o. male who has above history reviewed by me presents for 4 months follow up for CLL and iron deficiency anemia, MGUS Today patient was accompanied by his wife He has good appetite.  Denies any unintentional weight loss, fatigue, night sweats, fever.  No new complaints.  He has right knee replacement.   Review of Systems  Constitutional:  Negative for appetite change, chills, fatigue, fever and unexpected weight change.  HENT:   Negative for hearing loss and voice change.   Eyes:  Negative for eye problems and icterus.  Respiratory:  Negative for chest tightness, cough and shortness of breath.   Cardiovascular:  Negative for chest pain and leg swelling.  Gastrointestinal:  Negative for abdominal distention and abdominal pain.  Endocrine: Negative for hot flashes.  Genitourinary:  Negative for difficulty urinating, dysuria and frequency.   Musculoskeletal:  Positive for arthralgias. Negative for back pain.  Skin:  Negative for itching and rash.       Skin cancer  Neurological:  Negative for light-headedness and numbness.  Hematological:  Negative for adenopathy. Does not bruise/bleed easily.  Psychiatric/Behavioral:  Negative for confusion.    MEDICAL HISTORY:  Past Medical History:  Diagnosis Date   Cancer [facility])    prostate    CKD (chronic kidney disease) stage 3, GFR 30-59 ml/min (HCC) 3/64/6803   Complication of anesthesia    bradycardia, in ICU for 24 hour after galbladder surgery.    Diverticulosis 2012   Dysrhythmia    Heart skips a beat   Elevated lipids  GERD (gastroesophageal reflux disease)    History of hiatal hernia    Hypertension    Hypothyroidism    Leukemia (HCC)    Prostate cancer ([place])    Skin cancer    face, scalp, behind ear,back and hand   Tremors of nervous system     SURGICAL HISTORY: Past Surgical  History:  Procedure Laterality Date   BLADDER TUMOR EXCISION     CARDIAC CATHETERIZATION     CHOLECYSTECTOMY N/A 10/10/2014   Procedure: LAPAROSCOPIC CHOLECYSTECTOMY WITH INTRAOPERATIVE CHOLANGIOGRAM;  Surgeon: Leonie Green, MD;  Location: [facility] ORS;  Service: General;  Laterality: N/A;   COLONOSCOPY WITH PROPOFOL N/A 02/16/2017   Procedure: COLONOSCOPY WITH PROPOFOL;  Surgeon: Manya Silvas, MD;  Location: [facility] ENDOSCOPY;  Service: Endoscopy;  Laterality: N/A;   ESOPHAGOGASTRODUODENOSCOPY (EGD) WITH PROPOFOL N/A 02/16/2017   Procedure: ESOPHAGOGASTRODUODENOSCOPY (EGD) WITH PROPOFOL;  Surgeon: Manya Silvas, MD;  Location: [facility] ENDOSCOPY;  Service: Endoscopy;  Laterality: N/A;   HEMORRHOID SURGERY     HERNIA REPAIR Left    x2   JOINT REPLACEMENT Left    Partial Knee Replacement, Dr. Roland Rack   PARTIAL KNEE ARTHROPLASTY Left 12/12/2014   Procedure: UNICOMPARTMENTAL KNEE;  Surgeon: Corky Mull, MD;  Location: [facility] ORS;  Service: Orthopedics;  Laterality: Left;   PARTIAL KNEE ARTHROPLASTY Right 09/25/2020   Procedure: RIGHT PARTIAL KNEE REPLACEMENT;  Surgeon: Corky Mull, MD;  Location: [facility] ORS;  Service: Orthopedics;  Laterality: Right;   prostate seeding     SHOULDER ARTHROSCOPY Right    SHOULDER ARTHROSCOPY WITH OPEN ROTATOR CUFF REPAIR Left 02/07/2016   Procedure: SHOULDER ARTHROSCOPY WITH OPEN ROTATOR CUFF REPAIR;  Surgeon: Corky Mull, MD;  Location: [facility] ORS;  Service: Orthopedics;  Laterality: Left;    SOCIAL HISTORY: Social History   Socioeconomic History   Marital status: Married    Spouse name: Not on file   Number of children: Not on file   Years of education: Not on file   Highest education level: Not on file  Occupational History   Not on file  Tobacco Use   Smoking status: Never   Smokeless tobacco: Never  Vaping Use   Vaping Use: Never used  Substance and Sexual Activity   Alcohol use: No   Drug use: No   Sexual activity: Yes  Other Topics  Concern   Not on file  Social History Narrative   Not on file   Social Determinants of Health   Financial Resource Strain: Not on file  Food Insecurity: Not on file  Transportation Needs: Not on file  Physical Activity: Not on file  Stress: Not on file  Social Connections: Not on file  Intimate Partner Violence: Not on file    FAMILY HISTORY: Family History  Problem Relation Age of Onset   Bone cancer Father    Hypertension Mother    Osteoporosis Mother    Breast cancer Sister    Cancer Brother    Cancer Brother    Lung cancer Brother    Bladder Cancer Brother    Melanoma Brother     ALLERGIES:  is allergic to oxycodone-acetaminophen and voltaren [diclofenac sodium].  MEDICATIONS:  Current Outpatient Medications  Medication Sig Dispense Refill   acetaminophen (TYLENOL) 500 MG tablet Take 1,000 mg by mouth every 8 (eight) hours as needed for moderate pain.     ascorbic acid (VITAMIN C) 1000 MG tablet Take 1,000 mg by mouth daily.     b complex vitamins capsule  Take 1 capsule by mouth daily.     calcium carbonate (OSCAL) 1500 (600 Ca) MG TABS tablet Take 1,500 mg by mouth daily with breakfast.     Cholecalciferol 25 MCG (1000 UT) tablet Take 1,000 Units by mouth daily.     donepezil (ARICEPT) 10 MG tablet Take 10 mg by mouth at bedtime.     DULoxetine (CYMBALTA) 20 MG capsule Take 20 mg by mouth daily.     famotidine (PEPCID) 40 MG tablet Take 40 mg by mouth 2 (two) times daily.     ferrous sulfate 325 (65 FE) MG EC tablet Take 325 mg by mouth 3 (three) times daily with meals.     Flaxseed, Linseed, (FLAX SEED OIL) 1000 MG CAPS Take 1,000 mg by mouth daily.     gabapentin (NEURONTIN) 100 MG capsule Take 100 mg by mouth 3 (three) times daily.      Garlic 5400 MG CAPS Take 1 capsule by mouth every morning.     Ginkgo Biloba (GINKOBA PO) Take 2 tablets by mouth daily.     latanoprost (XALATAN) 0.005 % ophthalmic solution Place 1 drop into both eyes at bedtime.      levothyroxine (SYNTHROID) 50 MCG tablet Take 50 mcg by mouth daily before breakfast. Take on an empty stomach with a glass of water at least 30-60 minutes before breakfast.     loratadine (CLARITIN REDITABS) 10 MG dissolvable tablet Take 10 mg by mouth daily. In am     Misc Natural Products (BLACK CHERRY CONCENTRATE) LIQD Take 15 mLs by mouth daily.      MISC NATURAL PRODUCTS PO Take 2 capsules by mouth daily. Beet juice capsules     Omega-3 Fatty Acids (FISH OIL) 1000 MG CAPS Take 1 capsule by mouth daily.     ondansetron (ZOFRAN) 4 MG tablet Take 1 tablet (4 mg total) by mouth every 6 (six) hours as needed for nausea. 30 tablet 0   OVER THE COUNTER MEDICATION Take 1 capsule by mouth daily. Neuriva     OVER THE COUNTER MEDICATION Take 1 capsule by mouth daily. Ageless Brain     OVER THE COUNTER MEDICATION Take 1,000 mg by mouth daily. BACOPA     primidone (MYSOLINE) 50 MG tablet Take 50 mg by mouth 2 (two) times daily.     propranolol (INDERAL) 10 MG tablet Take 10 mg by mouth 2 (two) times daily.     vitamin E 180 MG (400 UNITS) capsule Take 400 Units by mouth daily.     zinc gluconate 50 MG tablet Take 50 mg by mouth daily.     enoxaparin (LOVENOX) 40 MG/0.4ML injection Inject 0.4 mLs (40 mg total) into the skin daily. (Patient not taking: Reported on 01/18/2021) 5.6 mL 0   HYDROcodone-acetaminophen (NORCO/VICODIN) 5-325 MG tablet Take 1 tablet by mouth every 6 (six) hours as needed for moderate pain (pain score 4-6). (Patient not taking: Reported on 01/18/2021) 30 tablet 0   traMADol (ULTRAM) 50 MG tablet Take 1 tablet (50 mg total) by mouth every 8 (eight) hours as needed for moderate pain. (Patient not taking: Reported on 01/18/2021) 30 tablet 0   No current facility-administered medications for this visit.      Marland Kitchen  PHYSICAL EXAMINATION: ECOG PERFORMANCE STATUS: 1 - Symptomatic but completely ambulatory Vitals:   01/18/21 1009  BP: (!) 162/75  Pulse: (!) 56  Resp: 18  Temp: (!) 97 F  (36.1 C)  SpO2: 98%   Filed Weights   01/18/21  1009  Weight: 180 lb 1.6 oz (81.7 kg)    Physical Exam Constitutional:      General: He is not in acute distress.    Appearance: He is not diaphoretic.     Comments: Patient ambulates independently  HENT:     Head: Normocephalic and atraumatic.     Nose: Nose normal.     Mouth/Throat:     Pharynx: No oropharyngeal exudate.  Eyes:     General: No scleral icterus.    Pupils: Pupils are equal, round, and reactive to light.  Cardiovascular:     Rate and Rhythm: Normal rate.     Heart sounds: No murmur heard. Pulmonary:     Effort: Pulmonary effort is normal. No respiratory distress.     Breath sounds: No rales.  Chest:     Chest wall: No tenderness.  Abdominal:     General: There is no distension.     Palpations: Abdomen is soft.     Tenderness: There is no abdominal tenderness.  Musculoskeletal:        General: Normal range of motion.     Cervical back: Normal range of motion and neck supple.     Comments: Status post right knee replacement  Skin:    General: Skin is warm and dry.     Findings: No erythema.  Neurological:     Mental Status: He is alert and oriented to person, place, and time.  Psychiatric:        Mood and Affect: Affect normal.  .    LABORATORY DATA:  I have reviewed the data as listed Lab Results  Component Value Date   WBC 11.7 (H) 01/09/2021   HGB 13.1 01/09/2021   HCT 39.9 01/09/2021   MCV 106.4 (H) 01/09/2021   PLT 130 (L) 01/09/2021   Recent Labs    07/06/20 1111 10/05/20 1146 01/09/21 1100  NA 140 134* 136  K 4.0 4.2 4.4  CL 103 96* 97*  CO2 _0 GLUCOSE 104* 155* 114*  BUN 18 24* 17  CREATININE 1.21 1.35* 1.12  CALCIUM 9.3 9.4 9.6  GFRNONAA 58* 51* >60  PROT 7.0 7.2 7.2  ALBUMIN 3.8 3.5 4.0  AST 21 32 20  ALT _1 ALKPHOS 68 76 102  BILITOT 0.7 0.7 0.4     RADIOGRAPHIC STUDIES: I have personally reviewed the radiological images as listed and agreed with the  findings in the report. no recent images. 11/04/2017  US abdomen Stable right renal cyst. Status post cholecystectomy.Previously seen lesion within the liver is not well appreciated on this exam. No Splenomegaly.    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  Cytogenetics revealed Trisomy 12 IGVH unmutated.  ASSESSMENT & PLAN:  1. CLL (chronic lymphocytic leukemia) ([place])   2. MGUS (monoclonal gammopathy of unknown significance)   3. Stage 3a chronic kidney disease ([place])   4. Encounter for antineoplastic chemotherapy   5. Thrombocytopenia ([place])    Per patient's request, I also called patient's daughter Anderson Malta and updated her. # CLL, intermediate risk with trisomy 12. IGVH unmutated. Stage III, extensive bone marrow involvement. Off acalabrutinib 100 mg twice daily Peripheral blood flow cytometry showed CLL population, 48% of total cells. Continue watchful waiting.  We discussed about possibility of resuming therapy in the near future if progression.   MGUS, IgG lambda and IgM kappa clones. 02/14/2020 M protein increased from 2.3 to1.1. 04/03/2020, M protein 1.0. 07/06/2020 M protein 0.9, IgG lambda  and new IgM kappa clone 10/05/2020, M protein 1.0 IgG lambda and new IgM kappa clone 01/09/2021 M protein 1.0 IgG lambda and new IgM kappa clone Continue observation.  #CKD,  avoid nephrotoxins.  Encourage oral hydration. #Thrombocytopenia, but stable. Follow up in 3 months  [name], MD, PhD  01/18/2021

## 2021-01-24 ENCOUNTER — Other Ambulatory Visit: Payer: Self-pay

## 2021-01-24 ENCOUNTER — Encounter: Payer: Self-pay | Admitting: Emergency Medicine

## 2021-01-24 ENCOUNTER — Emergency Department
Admission: EM | Admit: 2021-01-24 | Discharge: 2021-01-24 | Disposition: A | Discharge status: Home / Self Care | Payer: Medicare Other | Attending: Emergency Medicine | Admitting: Emergency Medicine

## 2021-01-24 ENCOUNTER — Emergency Department: Payer: Medicare Other

## 2021-01-24 DIAGNOSIS — E1122 Type 2 diabetes mellitus with diabetic chronic kidney disease: Secondary | ICD-10-CM | POA: Diagnosis not present

## 2021-01-24 DIAGNOSIS — I129 Hypertensive chronic kidney disease with stage 1 through stage 4 chronic kidney disease, or unspecified chronic kidney disease: Secondary | ICD-10-CM | POA: Diagnosis not present

## 2021-01-24 DIAGNOSIS — Z85828 Personal history of other malignant neoplasm of skin: Secondary | ICD-10-CM | POA: Insufficient documentation

## 2021-01-24 DIAGNOSIS — Z79899 Other long term (current) drug therapy: Secondary | ICD-10-CM | POA: Diagnosis not present

## 2021-01-24 DIAGNOSIS — Z96653 Presence of artificial knee joint, bilateral: Secondary | ICD-10-CM | POA: Insufficient documentation

## 2021-01-24 DIAGNOSIS — W19XXXA Unspecified fall, initial encounter: Secondary | ICD-10-CM

## 2021-01-24 DIAGNOSIS — S022XXA Fracture of nasal bones, initial encounter for closed fracture: Secondary | ICD-10-CM | POA: Diagnosis not present

## 2021-01-24 DIAGNOSIS — E039 Hypothyroidism, unspecified: Secondary | ICD-10-CM | POA: Diagnosis not present

## 2021-01-24 DIAGNOSIS — Z96612 Presence of left artificial shoulder joint: Secondary | ICD-10-CM | POA: Insufficient documentation

## 2021-01-24 DIAGNOSIS — Z96611 Presence of right artificial shoulder joint: Secondary | ICD-10-CM | POA: Insufficient documentation

## 2021-01-24 DIAGNOSIS — Z23 Encounter for immunization: Secondary | ICD-10-CM | POA: Diagnosis not present

## 2021-01-24 DIAGNOSIS — N1831 Chronic kidney disease, stage 3a: Secondary | ICD-10-CM | POA: Diagnosis not present

## 2021-01-24 DIAGNOSIS — U071 COVID-19: Secondary | ICD-10-CM

## 2021-01-24 DIAGNOSIS — Z8546 Personal history of malignant neoplasm of prostate: Secondary | ICD-10-CM | POA: Diagnosis not present

## 2021-01-24 DIAGNOSIS — W06XXXA Fall from bed, initial encounter: Secondary | ICD-10-CM | POA: Insufficient documentation

## 2021-01-24 DIAGNOSIS — Y92009 Unspecified place in unspecified non-institutional (private) residence as the place of occurrence of the external cause: Secondary | ICD-10-CM

## 2021-01-24 DIAGNOSIS — S0992XA Unspecified injury of nose, initial encounter: Secondary | ICD-10-CM | POA: Diagnosis present

## 2021-01-24 LAB — CBC
HCT: 37.6 % — ABNORMAL LOW (ref 39.0–52.0)
Hemoglobin: 12.3 g/dL — ABNORMAL LOW (ref 13.0–17.0)
MCH: 34.6 pg — ABNORMAL HIGH (ref 26.0–34.0)
MCHC: 32.7 g/dL (ref 30.0–36.0)
MCV: 105.9 fL — ABNORMAL HIGH (ref 80.0–100.0)
Platelets: 124 10*3/uL — ABNORMAL LOW (ref 150–400)
RBC: 3.55 MIL/uL — ABNORMAL LOW (ref 4.22–5.81)
RDW: 12.2 % (ref 11.5–15.5)
WBC: 6.5 10*3/uL (ref 4.0–10.5)
nRBC: 0 % (ref 0.0–0.2)

## 2021-01-24 LAB — BASIC METABOLIC PANEL
Anion gap: 6 (ref 5–15)
BUN: 19 mg/dL (ref 8–23)
CO2: 29 mmol/L (ref 22–32)
Calcium: 9.1 mg/dL (ref 8.9–10.3)
Chloride: 100 mmol/L (ref 98–111)
Creatinine, Ser: 1.45 mg/dL — ABNORMAL HIGH (ref 0.61–1.24)
GFR, Estimated: 47 mL/min — ABNORMAL LOW (ref >60)
Glucose, Bld: 110 mg/dL — ABNORMAL HIGH (ref 70–99)
Potassium: 4.4 mmol/L (ref 3.5–5.1)
Sodium: 135 mmol/L (ref 135–145)

## 2021-01-24 MED ORDER — MOLNUPIRAVIR EUA 200MG CAPSULE: 4.0000 | ORAL_CAPSULE | Freq: Two times a day (BID) | ORAL | 0 refills | Status: AC

## 2021-01-24 MED ORDER — TETANUS-DIPHTH-ACELL PERTUSSIS 5-2.5-18.5 LF-MCG/0.5 IM SUSY
0.5000 mL | PREFILLED_SYRINGE | Freq: Once | INTRAMUSCULAR | Status: AC
  Administered 2021-01-24: 0.5 mL via INTRAMUSCULAR
  Filled 2021-01-24: qty 0.5

## 2021-01-24 NOTE — ED Triage Notes (Signed)
Pt to ED via [facility] from home for fall last night. Pt did hit his head. Pt denies blood thinners. Pt denies LOC. Pt c/o generalized weakness, and pain in his right shoulder and knee. Pt tested positive for COVID yesterday.

## 2021-01-24 NOTE — ED Notes (Signed)
This tech provided pt with a drink and crackers

## 2021-01-24 NOTE — ED Notes (Signed)
This tech provided w/ pericare and dry brief.

## 2021-01-24 NOTE — ED Notes (Signed)
Dr Joni Fears evaluated pt at bedside. Pt is stable for discharge.

## 2021-01-24 NOTE — ED Provider Notes (Signed)
[facility] Emergency Department Provider Note  ____________________________________________  Time seen: Approximately 8:50 AM  I have reviewed the triage vital signs and the nursing notes.   HISTORY  Chief Complaint Fall    HPI Allen HENTON is a 87 y.o. male with a history of CKD, GERD, hypertension who comes the ED after a fall at home.  He reports that he was rolling over in bed and fell off the bed and got wedged between the bed and the wall.  Complains of some pain at the right upper shoulder.  Denies headache or any significant neck pain.  No loss of consciousness or vision changes.  Pain is mild, worse with movement, nonradiating, no alleviating factors    Past Medical History:  Diagnosis Date   Cancer ([place])    prostate    CKD (chronic kidney disease) stage 3, GFR 30-59 ml/min (HCC) 7/89/3810   Complication of anesthesia    bradycardia, in ICU for 24 hour after galbladder surgery.    Diverticulosis 2012   Dysrhythmia    Heart skips a beat   Elevated lipids    GERD (gastroesophageal reflux disease)    History of hiatal hernia    Hypertension    Hypothyroidism    Leukemia (HCC)    Prostate cancer ([place])    Skin cancer    face, scalp, behind ear,back and hand   Tremors of nervous system      Patient Active Problem List   Diagnosis Date Noted   Status post right partial knee replacement 09/25/2020   Lower GI bleed 05/03/2020   CKD (chronic kidney disease), stage III ([place]) 05/03/2020   Hiatal hernia 12/16/2019   Postural dizziness with presyncope 12/16/2019   New onset Atrial fibrillation with slow ventricular response ([place]) 12/16/2019   Chest pain 12/16/2019   Encounter for antineoplastic chemotherapy 10/22/2018   Goals of care, counseling/discussion 09/28/2018   Thrombocytopenia ([place]) 10/27/2016   Iron deficiency 10/27/2016   CLL (chronic lymphocytic leukemia) ([place]) 10/27/2016   Macrocytic anemia 10/27/2016   Acquired  hypothyroidism 10/01/2016   Chronic kidney disease (CKD) stage G3a/A1, moderately decreased glomerular filtration rate (GFR) between 45-59 mL/min/1.73 square meter and albuminuria creatinine ratio less than 30 mg/g (HCC) 10/01/2016   Status post left partial knee replacement 12/12/2014   Bradycardia 10/10/2014   Severe sinus bradycardia 10/10/2014   Benign essential hypertension 05/02/2013   Diabetes mellitus ([place]) 05/02/2013   Hyperlipidemia, unspecified 05/02/2013     Past Surgical History:  Procedure Laterality Date   BLADDER TUMOR EXCISION     CARDIAC CATHETERIZATION     CHOLECYSTECTOMY N/A 10/10/2014   Procedure: LAPAROSCOPIC CHOLECYSTECTOMY WITH INTRAOPERATIVE CHOLANGIOGRAM;  Surgeon: Leonie Green, MD;  Location: [facility] ORS;  Service: General;  Laterality: N/A;   COLONOSCOPY WITH PROPOFOL N/A 02/16/2017   Procedure: COLONOSCOPY WITH PROPOFOL;  Surgeon: Manya Silvas, MD;  Location: [facility] ENDOSCOPY;  Service: Endoscopy;  Laterality: N/A;   ESOPHAGOGASTRODUODENOSCOPY (EGD) WITH PROPOFOL N/A 02/16/2017   Procedure: ESOPHAGOGASTRODUODENOSCOPY (EGD) WITH PROPOFOL;  Surgeon: Manya Silvas, MD;  Location: [facility] ENDOSCOPY;  Service: Endoscopy;  Laterality: N/A;   HEMORRHOID SURGERY     HERNIA REPAIR Left    x2   JOINT REPLACEMENT Left    Partial Knee Replacement, Dr. Roland Rack   PARTIAL KNEE ARTHROPLASTY Left 12/12/2014   Procedure: UNICOMPARTMENTAL KNEE;  Surgeon: Corky Mull, MD;  Location: [facility] ORS;  Service: Orthopedics;  Laterality: Left;   PARTIAL KNEE ARTHROPLASTY Right 09/25/2020   Procedure: RIGHT  PARTIAL KNEE REPLACEMENT;  Surgeon: Corky Mull, MD;  Location: [facility] ORS;  Service: Orthopedics;  Laterality: Right;   prostate seeding     SHOULDER ARTHROSCOPY Right    SHOULDER ARTHROSCOPY WITH OPEN ROTATOR CUFF REPAIR Left 02/07/2016   Procedure: SHOULDER ARTHROSCOPY WITH OPEN ROTATOR CUFF REPAIR;  Surgeon: Corky Mull, MD;  Location: [facility] ORS;  Service: Orthopedics;   Laterality: Left;     Prior to Admission medications   Medication Sig Start Date End Date Taking? Authorizing Provider  acetaminophen (TYLENOL) 500 MG tablet Take 1,000 mg by mouth every 8 (eight) hours as needed for moderate pain.    [provider]  ascorbic acid (VITAMIN C) 1000 MG tablet Take 1,000 mg by mouth daily.    [provider]  b complex vitamins capsule Take 1 capsule by mouth daily.    [provider]  calcium carbonate (OSCAL) 1500 (600 Ca) MG TABS tablet Take 1,500 mg by mouth daily with breakfast.    [provider]  Cholecalciferol 25 MCG (1000 UT) tablet Take 1,000 Units by mouth daily.    [provider]  donepezil (ARICEPT) 10 MG tablet Take 10 mg by mouth at bedtime. 05/24/20   [provider]  DULoxetine (CYMBALTA) 20 MG capsule Take 20 mg by mouth daily. 06/08/19   [provider]  enoxaparin (LOVENOX) 40 MG/0.4ML injection Inject 0.4 mLs (40 mg total) into the skin daily. Patient not taking: Reported on 01/18/2021 09/26/20   Lattie Corns, PA-C  famotidine (PEPCID) 40 MG tablet Take 40 mg by mouth 2 (two) times daily. 04/28/19   [provider]  ferrous sulfate 325 (65 FE) MG EC tablet Take 325 mg by mouth 3 (three) times daily with meals.    [provider]  Flaxseed, Linseed, (FLAX SEED OIL) 1000 MG CAPS Take 1,000 mg by mouth daily.    [provider]  gabapentin (NEURONTIN) 100 MG capsule Take 100 mg by mouth 3 (three) times daily.  02/15/18   [provider]  Garlic 1610 MG CAPS Take 1 capsule by mouth every morning.    [provider]  Ginkgo Biloba (GINKOBA PO) Take 2 tablets by mouth daily.    [provider]  HYDROcodone-acetaminophen (NORCO/VICODIN) 5-325 MG tablet Take 1 tablet by mouth every 6 (six) hours as needed for moderate pain (pain score 4-6). Patient not taking: Reported on 01/18/2021 09/26/20   Lattie Corns, PA-C  latanoprost  (XALATAN) 0.005 % ophthalmic solution Place 1 drop into both eyes at bedtime.    [provider]  levothyroxine (SYNTHROID) 50 MCG tablet Take 50 mcg by mouth daily before breakfast. Take on an empty stomach with a glass of water at least 30-60 minutes before breakfast. 04/17/20 04/17/21  [provider]  loratadine (CLARITIN REDITABS) 10 MG dissolvable tablet Take 10 mg by mouth daily. In am    [provider]  Misc Natural Products (BLACK CHERRY CONCENTRATE) LIQD Take 15 mLs by mouth daily.     [provider]  MISC NATURAL PRODUCTS PO Take 2 capsules by mouth daily. Beet juice capsules    [provider]  Omega-3 Fatty Acids (FISH OIL) 1000 MG CAPS Take 1 capsule by mouth daily.    [provider]  ondansetron (ZOFRAN) 4 MG tablet Take 1 tablet (4 mg total) by mouth every 6 (six) hours as needed for nausea. 09/26/20   Lattie Corns, PA-C  OVER THE COUNTER MEDICATION Take 1 capsule  by mouth daily. Neuriva    [provider]  OVER THE COUNTER MEDICATION Take 1 capsule by mouth daily. Ageless Brain    [provider]  OVER THE COUNTER MEDICATION Take 1,000 mg by mouth daily. BACOPA    [provider]  primidone (MYSOLINE) 50 MG tablet Take 50 mg by mouth 2 (two) times daily. 02/21/20 04/05/21  [provider]  propranolol (INDERAL) 10 MG tablet Take 10 mg by mouth 2 (two) times daily.    [provider]  traMADol (ULTRAM) 50 MG tablet Take 1 tablet (50 mg total) by mouth every 8 (eight) hours as needed for moderate pain. Patient not taking: Reported on 01/18/2021 09/26/20   Lattie Corns, PA-C  vitamin E 180 MG (400 UNITS) capsule Take 400 Units by mouth daily.    [provider]  zinc gluconate 50 MG tablet Take 50 mg by mouth daily.    [provider]     Allergies Oxycodone-acetaminophen and Voltaren [diclofenac sodium]   Family History  Problem Relation Age of Onset    Bone cancer Father    Hypertension Mother    Osteoporosis Mother    Breast cancer Sister    Cancer Brother    Cancer Brother    Lung cancer Brother    Bladder Cancer Brother    Melanoma Brother     Social History Social History   Tobacco Use   Smoking status: Never   Smokeless tobacco: Never  Vaping Use   Vaping Use: Never used  Substance Use Topics   Alcohol use: No   Drug use: No    Review of Systems  Constitutional:   No fever or chills.  ENT:   No sore throat. No rhinorrhea. Cardiovascular:   No chest pain or syncope. Respiratory:   No dyspnea or cough. Gastrointestinal:   Negative for abdominal pain, vomiting and diarrhea.  Musculoskeletal:   Right shoulder pain as above All other systems reviewed and are negative except as documented above in ROS and HPI.  ____________________________________________   PHYSICAL EXAM:  VITAL SIGNS: ED Triage Vitals  Enc Vitals Group     BP 01/24/21 0720 113/61     Pulse Rate 01/24/21 0720 (!) 58     Resp 01/24/21 0720 17     Temp 01/24/21 0720 99.2 F (37.3 C)     Temp Source 01/24/21 0720 Oral     SpO2 01/24/21 0710 95 %     Weight 01/24/21 0726 180 lb 1.6 oz (81.7 kg)     Height 01/24/21 0726 5\' 9"  (1.753 m)     Head Circumference --      Peak Flow --      Pain Score 01/24/21 0726 7     Pain Loc --      Pain Edu? --      Excl. in [place]? --     Vital signs reviewed, nursing assessments reviewed.   Constitutional:   Alert and oriented. Non-toxic appearance. Eyes:   Conjunctivae are normal. EOMI. PERRL. ENT      Head:   Normocephalic with soft right forehead scalp hematoma and abrasion, no laceration.      Nose:   Normal, no septal hematoma or epistaxis.  Small amount of swelling over the nasal bridge.      Mouth/Throat:   Normal, no intraoral injury..      Neck:   No meningismus. Full ROM.  No midline tenderness.   Hematological/Lymphatic/Immunilogical:   No cervical lymphadenopathy.  Cardiovascular:   RRR.  Symmetric bilateral radial and DP pulses.  No murmurs. Cap refill less than 2 seconds. Respiratory:   Normal respiratory effort without tachypnea/retractions. Breath sounds are clear and equal bilaterally. No wheezes/rales/rhonchi. Gastrointestinal:   Soft and nontender. Non distended. There is no CVA tenderness.  No rebound, rigidity, or guarding. Genitourinary:   deferred Musculoskeletal:   Normal range of motion in all extremities. No joint effusions.  No lower extremity tenderness.  No edema.  There is some mild tenderness over the right AC joint and superior humerus without deformity. Neurologic:   Normal speech and language.  Motor grossly intact. No acute focal neurologic deficits are appreciated.  Skin:    Skin is warm, dry and intact. No rash noted.  No petechiae, purpura, or bullae.  ____________________________________________    LABS (pertinent positives/negatives) (all labs ordered are listed, but only abnormal results are displayed) Labs Reviewed  CBC - Abnormal; Notable for the following components:      Result Value   RBC 3.55 (*)    Hemoglobin 12.3 (*)    HCT 37.6 (*)    MCV 105.9 (*)    MCH 34.6 (*)    Platelets 124 (*)    All other components within normal limits  BASIC METABOLIC PANEL - Abnormal; Notable for the following components:   Glucose, Bld 110 (*)    Creatinine, Ser 1.45 (*)    GFR, Estimated 47 (*)    All other components within normal limits   ____________________________________________     ____________________________________________    RADIOLOGY  DG Shoulder Right  Result Date: 01/24/2021 CLINICAL DATA:  Right shoulder pain, fall EXAM: RIGHT SHOULDER - 2+ VIEW COMPARISON:  None. FINDINGS: There is no acute fracture or dislocation. Shoulder alignment is normal. There is bulky degenerative change about the [facility] joint. There are small calcifications about the humeral head which may reflect sequela of calcific tendinitis. There is greater  tuberosity surface irregularity suggesting rotator cuff pathology. IMPRESSION: 1. No acute fracture or dislocation. 2. Bulky degenerative change about the [facility] joint. 3. Greater tuberosity surface irregularity suggesting rotator cuff pathology. 4. Calcifications about the humeral head may reflect sequela of calcific tendinitis. Electronically Signed   By: Valetta Mole M.D.   On: 01/24/2021 08:19   CT HEAD WO CONTRAST (5MM)  Result Date: 01/24/2021 CLINICAL DATA:  Facial trauma, blunt. Additional history provided: Fall, abrasions to forehead, small laceration to bridge of nose. EXAM: CT HEAD WITHOUT CONTRAST CT MAXILLOFACIAL WITHOUT CONTRAST CT CERVICAL SPINE WITHOUT CONTRAST TECHNIQUE: Multidetector CT imaging of the head, cervical spine, and maxillofacial structures were performed using the standard protocol without intravenous contrast. Multiplanar CT image reconstructions of the cervical spine and maxillofacial structures were also generated. COMPARISON:  Brain MRI 02/03/2020. Head CT 10/05/2009. Report from maxillofacial CT 10/25/2003 (images unavailable). CT of the cervical spine 10/05/2009. FINDINGS: CT HEAD FINDINGS Brain: Mild generalized cerebral and cerebellar atrophy. Mild ill-defined hypoattenuation within the cerebral white matter, nonspecific but compatible with chronic small vessel ischemic disease. There is no acute intracranial hemorrhage. No demarcated cortical infarct. No extra-axial fluid collection. No evidence of an intracranial mass. No midline shift. Vascular: No hyperdense vessel.  Atherosclerotic calcifications. Skull: Normal. Negative for fracture or focal lesion. Other: Right forehead soft tissue swelling. Trace fluid within the left mastoid air cells. CT MAXILLOFACIAL FINDINGS Osseous: Nondisplaced fracture deformity of the left nasal bone. Elsewhere, no acute maxillofacial fracture is identified. Orbits: No acute orbital finding. Sinuses: Paranasal sinus disease, most notably as  follows. Scattered mucosal thickening and fluid within the bilateral ethmoid air cells, moderate in severity. 7 mm mucous retention cyst within the right maxillary sinus. Soft tissues: Right forehead soft tissue swelling. CT CERVICAL SPINE FINDINGS Alignment: Slight cervical levocurvature. Partially imaged thoracic dextrocurvature. No significant spondylolisthesis. Skull base and vertebrae: The basion-dental and atlanto-dental intervals are maintained.No evidence of acute fracture to the cervical spine. Soft tissues and spinal canal: No prevertebral fluid or swelling. No visible canal hematoma. Disc levels: Cervical spondylosis with multilevel disc space narrowing, disc bulges/central disc protrusions, uncovertebral hypertrophy and facet arthrosis. No appreciable high-grade spinal canal stenosis. Multilevel bony neural foraminal narrowing. Multilevel ventral osteophytes, some bridging. Upper chest: No consolidation within the imaged lung apices. No visible pneumothorax. IMPRESSION: CT head: 1. No evidence of acute intracranial abnormality. 2. Mild chronic small vessel ischemic changes within the cerebral white matter. 3. Mild generalized parenchymal atrophy. 4. Right forehead soft tissue swelling. 5. Trace fluid within the left mastoid air cells. CT maxillofacial: 1. Nondisplaced fracture of the left nasal bone. There is no appreciable overlying soft tissue swelling and this may be chronic. However, correlate with the injury mechanism and with physical exam findings. 2. Elsewhere, no acute maxillofacial fracture is identified. 3. Paranasal sinus disease, most notably ethmoidal. 4. Right forehead soft tissue swelling. CT cervical spine: 1. No evidence of acute fracture to the cervical spine. 2. Mild cervical levocurvature with partially imaged thoracic dextrocurvature. 3. Cervical spondylosis, as described. Electronically Signed   By: Kellie Simmering D.O.   On: 01/24/2021 08:22   CT Cervical Spine Wo  Contrast  Result Date: 01/24/2021 CLINICAL DATA:  Facial trauma, blunt. Additional history provided: Fall, abrasions to forehead, small laceration to bridge of nose. EXAM: CT HEAD WITHOUT CONTRAST CT MAXILLOFACIAL WITHOUT CONTRAST CT CERVICAL SPINE WITHOUT CONTRAST TECHNIQUE: Multidetector CT imaging of the head, cervical spine, and maxillofacial structures were performed using the standard protocol without intravenous contrast. Multiplanar CT image reconstructions of the cervical spine and maxillofacial structures were also generated. COMPARISON:  Brain MRI 02/03/2020. Head CT 10/05/2009. Report from maxillofacial CT 10/25/2003 (images unavailable). CT of the cervical spine 10/05/2009. FINDINGS: CT HEAD FINDINGS Brain: Mild generalized cerebral and cerebellar atrophy. Mild ill-defined hypoattenuation within the cerebral white matter, nonspecific but compatible with chronic small vessel ischemic disease. There is no acute intracranial hemorrhage. No demarcated cortical infarct. No extra-axial fluid collection. No evidence of an intracranial mass. No midline shift. Vascular: No hyperdense vessel.  Atherosclerotic calcifications. Skull: Normal. Negative for fracture or focal lesion. Other: Right forehead soft tissue swelling. Trace fluid within the left mastoid air cells. CT MAXILLOFACIAL FINDINGS Osseous: Nondisplaced fracture deformity of the left nasal bone. Elsewhere, no acute maxillofacial fracture is identified. Orbits: No acute orbital finding. Sinuses: Paranasal sinus disease, most notably as follows. Scattered mucosal thickening and fluid within the bilateral ethmoid air cells, moderate in severity. 7 mm mucous retention cyst within the right maxillary sinus. Soft tissues: Right forehead soft tissue swelling. CT CERVICAL SPINE FINDINGS Alignment: Slight cervical levocurvature. Partially imaged thoracic dextrocurvature. No significant spondylolisthesis. Skull base and vertebrae: The basion-dental and  atlanto-dental intervals are maintained.No evidence of acute fracture to the cervical spine. Soft tissues and spinal canal: No prevertebral fluid or swelling. No visible canal hematoma. Disc levels: Cervical spondylosis with multilevel disc space narrowing, disc bulges/central disc protrusions, uncovertebral hypertrophy and facet arthrosis. No appreciable high-grade spinal canal stenosis. Multilevel bony neural foraminal narrowing. Multilevel ventral osteophytes, some bridging. Upper chest: No consolidation within the imaged lung apices.  No visible pneumothorax. IMPRESSION: CT head: 1. No evidence of acute intracranial abnormality. 2. Mild chronic small vessel ischemic changes within the cerebral white matter. 3. Mild generalized parenchymal atrophy. 4. Right forehead soft tissue swelling. 5. Trace fluid within the left mastoid air cells. CT maxillofacial: 1. Nondisplaced fracture of the left nasal bone. There is no appreciable overlying soft tissue swelling and this may be chronic. However, correlate with the injury mechanism and with physical exam findings. 2. Elsewhere, no acute maxillofacial fracture is identified. 3. Paranasal sinus disease, most notably ethmoidal. 4. Right forehead soft tissue swelling. CT cervical spine: 1. No evidence of acute fracture to the cervical spine. 2. Mild cervical levocurvature with partially imaged thoracic dextrocurvature. 3. Cervical spondylosis, as described. Electronically Signed   By: Kellie Simmering D.O.   On: 01/24/2021 08:22   CT Maxillofacial Wo Contrast  Result Date: 01/24/2021 CLINICAL DATA:  Facial trauma, blunt. Additional history provided: Fall, abrasions to forehead, small laceration to bridge of nose. EXAM: CT HEAD WITHOUT CONTRAST CT MAXILLOFACIAL WITHOUT CONTRAST CT CERVICAL SPINE WITHOUT CONTRAST TECHNIQUE: Multidetector CT imaging of the head, cervical spine, and maxillofacial structures were performed using the standard protocol without intravenous contrast.  Multiplanar CT image reconstructions of the cervical spine and maxillofacial structures were also generated. COMPARISON:  Brain MRI 02/03/2020. Head CT 10/05/2009. Report from maxillofacial CT 10/25/2003 (images unavailable). CT of the cervical spine 10/05/2009. FINDINGS: CT HEAD FINDINGS Brain: Mild generalized cerebral and cerebellar atrophy. Mild ill-defined hypoattenuation within the cerebral white matter, nonspecific but compatible with chronic small vessel ischemic disease. There is no acute intracranial hemorrhage. No demarcated cortical infarct. No extra-axial fluid collection. No evidence of an intracranial mass. No midline shift. Vascular: No hyperdense vessel.  Atherosclerotic calcifications. Skull: Normal. Negative for fracture or focal lesion. Other: Right forehead soft tissue swelling. Trace fluid within the left mastoid air cells. CT MAXILLOFACIAL FINDINGS Osseous: Nondisplaced fracture deformity of the left nasal bone. Elsewhere, no acute maxillofacial fracture is identified. Orbits: No acute orbital finding. Sinuses: Paranasal sinus disease, most notably as follows. Scattered mucosal thickening and fluid within the bilateral ethmoid air cells, moderate in severity. 7 mm mucous retention cyst within the right maxillary sinus. Soft tissues: Right forehead soft tissue swelling. CT CERVICAL SPINE FINDINGS Alignment: Slight cervical levocurvature. Partially imaged thoracic dextrocurvature. No significant spondylolisthesis. Skull base and vertebrae: The basion-dental and atlanto-dental intervals are maintained.No evidence of acute fracture to the cervical spine. Soft tissues and spinal canal: No prevertebral fluid or swelling. No visible canal hematoma. Disc levels: Cervical spondylosis with multilevel disc space narrowing, disc bulges/central disc protrusions, uncovertebral hypertrophy and facet arthrosis. No appreciable high-grade spinal canal stenosis. Multilevel bony neural foraminal narrowing.  Multilevel ventral osteophytes, some bridging. Upper chest: No consolidation within the imaged lung apices. No visible pneumothorax. IMPRESSION: CT head: 1. No evidence of acute intracranial abnormality. 2. Mild chronic small vessel ischemic changes within the cerebral white matter. 3. Mild generalized parenchymal atrophy. 4. Right forehead soft tissue swelling. 5. Trace fluid within the left mastoid air cells. CT maxillofacial: 1. Nondisplaced fracture of the left nasal bone. There is no appreciable overlying soft tissue swelling and this may be chronic. However, correlate with the injury mechanism and with physical exam findings. 2. Elsewhere, no acute maxillofacial fracture is identified. 3. Paranasal sinus disease, most notably ethmoidal. 4. Right forehead soft tissue swelling. CT cervical spine: 1. No evidence of acute fracture to the cervical spine. 2. Mild cervical levocurvature with partially imaged thoracic dextrocurvature. 3. Cervical  spondylosis, as described. Electronically Signed   By: Kellie Simmering D.O.   On: 01/24/2021 08:22    ____________________________________________   PROCEDURES Procedures  ____________________________________________  DIFFERENTIAL DIAGNOSIS   Intracranial hemorrhage, C-spine fracture, facial bone fracture, maxillary sinus hemorrhage, proximal humerus fracture  CLINICAL IMPRESSION / ASSESSMENT AND PLAN / ED COURSE  Medications ordered in the ED: Medications - No data to display  Pertinent labs & imaging results that were available during my care of the patient were reviewed by me and considered in my medical decision making (see chart for details).  Chaitanya A Reaves was evaluated in Emergency Department on 01/24/2021 for the symptoms described in the history of present illness. He was evaluated in the context of the global COVID-19 pandemic, which necessitated consideration that the patient might be at risk for infection with the SARS-CoV-2 virus that causes  COVID-19. Institutional protocols and algorithms that pertain to the evaluation of patients at risk for COVID-19 are in a state of rapid change based on information released by regulatory bodies including the CDC and federal and state organizations. These policies and algorithms were followed during the patient's care in the ED.   Patient presents with a mechanical fall, accidental, otherwise in his usual state of health, imaging finds a small nasal bone fracture otherwise unremarkable.  Patient is feeling well, stable for discharge home.      ____________________________________________   FINAL CLINICAL IMPRESSION(S) / ED DIAGNOSES    Final diagnoses:  Fall in home, initial encounter  Closed fracture of nasal bone, initial encounter     ED Discharge Orders     None       Portions of this note were generated with dragon dictation software. Dictation errors may occur despite best attempts at proofreading.    Carrie Mew, MD 01/24/21 [phone]

## 2021-01-24 NOTE — ED Notes (Signed)
Went to answer call bell in bathroom and found pt to be on his knees next to the toilet- pt denied any injury- pt was assisted by multiple staff into wheelchair and into the room- Dr Joni Fears was made aware on incident and went to bedside to see pt

## 2021-01-24 NOTE — ED Notes (Signed)
This tech got in contact with pt's daughter Anderson Malta. She is aware his is for d/c and she will be here in 30 mins

## 2021-01-24 NOTE — ED Notes (Signed)
Patient transported to CT 

## 2021-01-24 NOTE — ED Triage Notes (Signed)
Pt comes into the ED via [facility] from home c/o fall out of his bed last night.  Pt did hit his head and presents with abrasions to the forehead and a small alc on the bridge of the nose.  Pt denies any blood thinners, denies any LOC.  Pt c/o generalized weakness and right shoulder pain.  Pt tested positive for COVID yesterday.

## 2021-01-24 NOTE — Discharge Instructions (Signed)
Your CT scan show a small fracture of the nasal bone which would not require treatment at this time, but you should avoid pressure on the nose and nose blowing for the next few weeks.  Otherwise, your imaging is reassuring with no identified acute injuries.  The x-ray of your right shoulder is also unremarkable without fractures.

## 2021-02-02 ENCOUNTER — Emergency Department: Payer: Medicare Other

## 2021-02-02 ENCOUNTER — Encounter: Payer: Self-pay | Admitting: Emergency Medicine

## 2021-02-02 ENCOUNTER — Other Ambulatory Visit: Payer: Self-pay

## 2021-02-02 DIAGNOSIS — N1831 Chronic kidney disease, stage 3a: Secondary | ICD-10-CM | POA: Diagnosis present

## 2021-02-02 DIAGNOSIS — K449 Diaphragmatic hernia without obstruction or gangrene: Secondary | ICD-10-CM | POA: Diagnosis present

## 2021-02-02 DIAGNOSIS — Z8546 Personal history of malignant neoplasm of prostate: Secondary | ICD-10-CM

## 2021-02-02 DIAGNOSIS — K219 Gastro-esophageal reflux disease without esophagitis: Secondary | ICD-10-CM | POA: Diagnosis present

## 2021-02-02 DIAGNOSIS — J189 Pneumonia, unspecified organism: Secondary | ICD-10-CM | POA: Diagnosis not present

## 2021-02-02 DIAGNOSIS — Z85828 Personal history of other malignant neoplasm of skin: Secondary | ICD-10-CM

## 2021-02-02 DIAGNOSIS — E860 Dehydration: Secondary | ICD-10-CM | POA: Diagnosis present

## 2021-02-02 DIAGNOSIS — G250 Essential tremor: Secondary | ICD-10-CM | POA: Diagnosis present

## 2021-02-02 DIAGNOSIS — Z20822 Contact with and (suspected) exposure to covid-19: Secondary | ICD-10-CM | POA: Diagnosis present

## 2021-02-02 DIAGNOSIS — E861 Hypovolemia: Secondary | ICD-10-CM | POA: Diagnosis present

## 2021-02-02 DIAGNOSIS — Z8616 Personal history of COVID-19: Secondary | ICD-10-CM

## 2021-02-02 DIAGNOSIS — Z8249 Family history of ischemic heart disease and other diseases of the circulatory system: Secondary | ICD-10-CM

## 2021-02-02 DIAGNOSIS — Z885 Allergy status to narcotic agent status: Secondary | ICD-10-CM

## 2021-02-02 DIAGNOSIS — Z856 Personal history of leukemia: Secondary | ICD-10-CM

## 2021-02-02 DIAGNOSIS — E039 Hypothyroidism, unspecified: Secondary | ICD-10-CM | POA: Diagnosis present

## 2021-02-02 DIAGNOSIS — Z888 Allergy status to other drugs, medicaments and biological substances status: Secondary | ICD-10-CM

## 2021-02-02 DIAGNOSIS — Z79899 Other long term (current) drug therapy: Secondary | ICD-10-CM

## 2021-02-02 DIAGNOSIS — E871 Hypo-osmolality and hyponatremia: Secondary | ICD-10-CM | POA: Diagnosis not present

## 2021-02-02 DIAGNOSIS — Z79890 Hormone replacement therapy (postmenopausal): Secondary | ICD-10-CM

## 2021-02-02 DIAGNOSIS — I129 Hypertensive chronic kidney disease with stage 1 through stage 4 chronic kidney disease, or unspecified chronic kidney disease: Secondary | ICD-10-CM | POA: Diagnosis present

## 2021-02-02 DIAGNOSIS — Z803 Family history of malignant neoplasm of breast: Secondary | ICD-10-CM

## 2021-02-02 DIAGNOSIS — F0390 Unspecified dementia without behavioral disturbance: Secondary | ICD-10-CM | POA: Diagnosis present

## 2021-02-02 LAB — CBC WITH DIFFERENTIAL/PLATELET
Abs Immature Granulocytes: 0.05 10*3/uL (ref 0.00–0.07)
Basophils Absolute: 0 10*3/uL (ref 0.0–0.1)
Basophils Relative: 0 %
Eosinophils Absolute: 0.2 10*3/uL (ref 0.0–0.5)
Eosinophils Relative: 2 %
HCT: 37.5 % — ABNORMAL LOW (ref 39.0–52.0)
Hemoglobin: 12.5 g/dL — ABNORMAL LOW (ref 13.0–17.0)
Immature Granulocytes: 1 %
Lymphocytes Relative: 54 %
Lymphs Abs: 5.9 10*3/uL — ABNORMAL HIGH (ref 0.7–4.0)
MCH: 34.6 pg — ABNORMAL HIGH (ref 26.0–34.0)
MCHC: 33.3 g/dL (ref 30.0–36.0)
MCV: 103.9 fL — ABNORMAL HIGH (ref 80.0–100.0)
Monocytes Absolute: 2.2 10*3/uL — ABNORMAL HIGH (ref 0.1–1.0)
Monocytes Relative: 21 %
Neutro Abs: 2.4 10*3/uL (ref 1.7–7.7)
Neutrophils Relative %: 22 %
Platelets: 121 10*3/uL — ABNORMAL LOW (ref 150–400)
RBC: 3.61 MIL/uL — ABNORMAL LOW (ref 4.22–5.81)
RDW: 12.1 % (ref 11.5–15.5)
WBC: 10.8 10*3/uL — ABNORMAL HIGH (ref 4.0–10.5)
nRBC: 0 % (ref 0.0–0.2)

## 2021-02-02 LAB — COMPREHENSIVE METABOLIC PANEL
ALT: 18 U/L (ref 0–44)
AST: 22 U/L (ref 15–41)
Albumin: 3.6 g/dL (ref 3.5–5.0)
Alkaline Phosphatase: 86 U/L (ref 38–126)
Anion gap: 8 (ref 5–15)
BUN: 24 mg/dL — ABNORMAL HIGH (ref 8–23)
CO2: 23 mmol/L (ref 22–32)
Calcium: 9.5 mg/dL (ref 8.9–10.3)
Chloride: 98 mmol/L (ref 98–111)
Creatinine, Ser: 1.31 mg/dL — ABNORMAL HIGH (ref 0.61–1.24)
GFR, Estimated: 53 mL/min — ABNORMAL LOW (ref >60)
Glucose, Bld: 105 mg/dL — ABNORMAL HIGH (ref 70–99)
Potassium: 4.8 mmol/L (ref 3.5–5.1)
Sodium: 129 mmol/L — ABNORMAL LOW (ref 135–145)
Total Bilirubin: 0.7 mg/dL (ref 0.3–1.2)
Total Protein: 7.3 g/dL (ref 6.5–8.1)

## 2021-02-02 LAB — LIPASE, BLOOD: Lipase: 32 U/L (ref 11–51)

## 2021-02-02 NOTE — ED Notes (Signed)
Labs before Flex    [name], [place] 02/02/21 2047

## 2021-02-02 NOTE — ED Triage Notes (Signed)
Pt comes into the ED via [facility] from home c/o fever and cough.  Pt tested positive for COVID on 01/23/21 and the cough is not going away.  100.7 temp, but tylenol given 2 hours ago.  All other VSS with EMS  130/70 60 HR 95% RA

## 2021-02-03 ENCOUNTER — Other Ambulatory Visit: Payer: Self-pay

## 2021-02-03 ENCOUNTER — Inpatient Hospital Stay
Admission: EM | Admit: 2021-02-03 | Discharge: 2021-02-06 | DRG: 194 | Disposition: A | Discharge status: Home Health | Payer: Medicare Other | Attending: Internal Medicine | Admitting: Internal Medicine

## 2021-02-03 ENCOUNTER — Emergency Department: Payer: Medicare Other

## 2021-02-03 DIAGNOSIS — K449 Diaphragmatic hernia without obstruction or gangrene: Secondary | ICD-10-CM | POA: Diagnosis present

## 2021-02-03 DIAGNOSIS — J189 Pneumonia, unspecified organism: Secondary | ICD-10-CM | POA: Diagnosis present

## 2021-02-03 DIAGNOSIS — E039 Hypothyroidism, unspecified: Secondary | ICD-10-CM

## 2021-02-03 DIAGNOSIS — F0390 Unspecified dementia without behavioral disturbance: Secondary | ICD-10-CM | POA: Diagnosis present

## 2021-02-03 DIAGNOSIS — Z85828 Personal history of other malignant neoplasm of skin: Secondary | ICD-10-CM | POA: Diagnosis not present

## 2021-02-03 DIAGNOSIS — E871 Hypo-osmolality and hyponatremia: Secondary | ICD-10-CM | POA: Diagnosis present

## 2021-02-03 DIAGNOSIS — N1831 Chronic kidney disease, stage 3a: Secondary | ICD-10-CM | POA: Diagnosis present

## 2021-02-03 DIAGNOSIS — K219 Gastro-esophageal reflux disease without esophagitis: Secondary | ICD-10-CM | POA: Diagnosis present

## 2021-02-03 DIAGNOSIS — Z79899 Other long term (current) drug therapy: Secondary | ICD-10-CM | POA: Diagnosis not present

## 2021-02-03 DIAGNOSIS — Z888 Allergy status to other drugs, medicaments and biological substances status: Secondary | ICD-10-CM | POA: Diagnosis not present

## 2021-02-03 DIAGNOSIS — Z885 Allergy status to narcotic agent status: Secondary | ICD-10-CM | POA: Diagnosis not present

## 2021-02-03 DIAGNOSIS — E860 Dehydration: Secondary | ICD-10-CM

## 2021-02-03 DIAGNOSIS — Z8616 Personal history of COVID-19: Secondary | ICD-10-CM | POA: Diagnosis not present

## 2021-02-03 DIAGNOSIS — Z8546 Personal history of malignant neoplasm of prostate: Secondary | ICD-10-CM | POA: Diagnosis not present

## 2021-02-03 DIAGNOSIS — I129 Hypertensive chronic kidney disease with stage 1 through stage 4 chronic kidney disease, or unspecified chronic kidney disease: Secondary | ICD-10-CM | POA: Diagnosis present

## 2021-02-03 DIAGNOSIS — G250 Essential tremor: Secondary | ICD-10-CM | POA: Diagnosis present

## 2021-02-03 DIAGNOSIS — Z803 Family history of malignant neoplasm of breast: Secondary | ICD-10-CM | POA: Diagnosis not present

## 2021-02-03 DIAGNOSIS — Z856 Personal history of leukemia: Secondary | ICD-10-CM | POA: Diagnosis not present

## 2021-02-03 DIAGNOSIS — E861 Hypovolemia: Secondary | ICD-10-CM | POA: Diagnosis present

## 2021-02-03 DIAGNOSIS — Z79890 Hormone replacement therapy (postmenopausal): Secondary | ICD-10-CM | POA: Diagnosis not present

## 2021-02-03 DIAGNOSIS — Z8249 Family history of ischemic heart disease and other diseases of the circulatory system: Secondary | ICD-10-CM | POA: Diagnosis not present

## 2021-02-03 DIAGNOSIS — Z20822 Contact with and (suspected) exposure to covid-19: Secondary | ICD-10-CM | POA: Diagnosis present

## 2021-02-03 LAB — BASIC METABOLIC PANEL
Anion gap: 6 (ref 5–15)
BUN: 16 mg/dL (ref 8–23)
CO2: 26 mmol/L (ref 22–32)
Calcium: 8.6 mg/dL — ABNORMAL LOW (ref 8.9–10.3)
Chloride: 102 mmol/L (ref 98–111)
Creatinine, Ser: 1.07 mg/dL (ref 0.61–1.24)
GFR, Estimated: >60 mL/min (ref >60)
Glucose, Bld: 106 mg/dL — ABNORMAL HIGH (ref 70–99)
Potassium: 4.1 mmol/L (ref 3.5–5.1)
Sodium: 134 mmol/L — ABNORMAL LOW (ref 135–145)

## 2021-02-03 LAB — C DIFFICILE QUICK SCREEN W PCR REFLEX
C Diff antigen: POSITIVE — AB
C Diff toxin: NEGATIVE

## 2021-02-03 LAB — CBC
HCT: 34.2 % — ABNORMAL LOW (ref 39.0–52.0)
Hemoglobin: 11.5 g/dL — ABNORMAL LOW (ref 13.0–17.0)
MCH: 34.8 pg — ABNORMAL HIGH (ref 26.0–34.0)
MCHC: 33.6 g/dL (ref 30.0–36.0)
MCV: 103.6 fL — ABNORMAL HIGH (ref 80.0–100.0)
Platelets: 111 10*3/uL — ABNORMAL LOW (ref 150–400)
RBC: 3.3 MIL/uL — ABNORMAL LOW (ref 4.22–5.81)
RDW: 12 % (ref 11.5–15.5)
WBC: 8.2 10*3/uL (ref 4.0–10.5)
nRBC: 0 % (ref 0.0–0.2)

## 2021-02-03 LAB — TROPONIN I (HIGH SENSITIVITY): Troponin I (High Sensitivity): 12 ng/L (ref <18)

## 2021-02-03 LAB — RESP PANEL BY RT-PCR (FLU A&B, COVID) ARPGX2
Influenza A by PCR: NEGATIVE
Influenza B by PCR: NEGATIVE
SARS Coronavirus 2 by RT PCR: POSITIVE — AB

## 2021-02-03 LAB — CLOSTRIDIUM DIFFICILE BY PCR, REFLEXED: Toxigenic C. Difficile by PCR: NEGATIVE

## 2021-02-03 LAB — PROCALCITONIN: Procalcitonin: <0.1 ng/mL

## 2021-02-03 MED ORDER — GABAPENTIN 100 MG PO CAPS
100.0000 mg | ORAL_CAPSULE | Freq: Three times a day (TID) | ORAL | Status: DC
  Administered 2021-02-03 – 2021-02-06 (×11): 100 mg via ORAL
  Filled 2021-02-03 (×11): qty 1

## 2021-02-03 MED ORDER — VITAMIN D 25 MCG (1000 UNIT) PO TABS
1000.0000 [IU] | ORAL_TABLET | Freq: Every day | ORAL | Status: DC
  Administered 2021-02-03 – 2021-02-06 (×4): 1000 [IU] via ORAL
  Filled 2021-02-03 (×9): qty 1

## 2021-02-03 MED ORDER — ZINC SULFATE 220 (50 ZN) MG PO CAPS
220.0000 mg | ORAL_CAPSULE | Freq: Every day | ORAL | Status: DC
  Administered 2021-02-03 – 2021-02-06 (×3): 220 mg via ORAL
  Filled 2021-02-03 (×4): qty 1

## 2021-02-03 MED ORDER — DULOXETINE HCL 20 MG PO CPEP
20.0000 mg | ORAL_CAPSULE | Freq: Every day | ORAL | Status: DC
  Administered 2021-02-03 – 2021-02-06 (×4): 20 mg via ORAL
  Filled 2021-02-03 (×4): qty 1

## 2021-02-03 MED ORDER — OMEGA-3-ACID ETHYL ESTERS 1 G PO CAPS
1.0000 g | ORAL_CAPSULE | Freq: Every day | ORAL | Status: DC
  Administered 2021-02-03 – 2021-02-06 (×4): 1 g via ORAL
  Filled 2021-02-03 (×4): qty 1

## 2021-02-03 MED ORDER — VITAMIN E 45 MG (100 UNIT) PO CAPS
400.0000 [IU] | ORAL_CAPSULE | Freq: Every day | ORAL | Status: DC
Start: 2021-02-03 — End: 2021-02-06
  Administered 2021-02-03 – 2021-02-06 (×4): 400 [IU] via ORAL
  Filled 2021-02-03 (×4): qty 4

## 2021-02-03 MED ORDER — ONDANSETRON HCL 4 MG PO TABS: 4.0000 mg | ORAL_TABLET | Freq: Four times a day (QID) | ORAL | Status: DC | PRN

## 2021-02-03 MED ORDER — ENOXAPARIN SODIUM 40 MG/0.4ML IJ SOSY
40.0000 mg | PREFILLED_SYRINGE | INTRAMUSCULAR | Status: DC
  Administered 2021-02-03 – 2021-02-05 (×3): 40 mg via SUBCUTANEOUS
  Filled 2021-02-03 (×3): qty 0.4

## 2021-02-03 MED ORDER — SODIUM CHLORIDE 0.9 % IV SOLN
1.0000 g | Freq: Once | INTRAVENOUS | Status: AC
  Administered 2021-02-03: 03:00:00 1 g via INTRAVENOUS
  Filled 2021-02-03: qty 10

## 2021-02-03 MED ORDER — IOHEXOL 350 MG/ML SOLN
75.0000 mL | Freq: Once | INTRAVENOUS | Status: AC | PRN
  Administered 2021-02-03: 02:00:00 75 mL via INTRAVENOUS

## 2021-02-03 MED ORDER — LATANOPROST 0.005 % OP SOLN
1.0000 [drp] | Freq: Every day | OPHTHALMIC | Status: DC
  Administered 2021-02-03 – 2021-02-05 (×3): 1 [drp] via OPHTHALMIC
  Filled 2021-02-03 (×2): qty 2.5

## 2021-02-03 MED ORDER — B COMPLEX-C PO TABS
1.0000 | ORAL_TABLET | Freq: Every day | ORAL | Status: DC
  Administered 2021-02-03 – 2021-02-06 (×3): 1 via ORAL
  Filled 2021-02-03 (×4): qty 1

## 2021-02-03 MED ORDER — PROPRANOLOL HCL 10 MG PO TABS
10.0000 mg | ORAL_TABLET | Freq: Two times a day (BID) | ORAL | Status: DC
  Administered 2021-02-03 – 2021-02-06 (×7): 10 mg via ORAL
  Filled 2021-02-03 (×8): qty 1

## 2021-02-03 MED ORDER — FERROUS SULFATE 325 (65 FE) MG PO TABS
325.0000 mg | ORAL_TABLET | Freq: Three times a day (TID) | ORAL | Status: DC
  Administered 2021-02-03 – 2021-02-06 (×10): 325 mg via ORAL
  Filled 2021-02-03 (×9): qty 1

## 2021-02-03 MED ORDER — IPRATROPIUM-ALBUTEROL 0.5-2.5 (3) MG/3ML IN SOLN
3.0000 mL | Freq: Once | RESPIRATORY_TRACT | Status: AC
  Administered 2021-02-03: 3 mL via RESPIRATORY_TRACT
  Filled 2021-02-03: qty 3

## 2021-02-03 MED ORDER — AZITHROMYCIN 500 MG PO TABS
500.0000 mg | ORAL_TABLET | Freq: Once | ORAL | Status: AC
  Administered 2021-02-03: 03:00:00 500 mg via ORAL
  Filled 2021-02-03: qty 1

## 2021-02-03 MED ORDER — LORATADINE 10 MG PO TABS
10.0000 mg | ORAL_TABLET | Freq: Every day | ORAL | Status: DC
  Administered 2021-02-03 – 2021-02-06 (×4): 10 mg via ORAL
  Filled 2021-02-03 (×4): qty 1

## 2021-02-03 MED ORDER — ASCORBIC ACID 500 MG PO TABS
1000.0000 mg | ORAL_TABLET | Freq: Every day | ORAL | Status: DC
  Administered 2021-02-03 – 2021-02-06 (×4): 1000 mg via ORAL
  Filled 2021-02-03 (×4): qty 2

## 2021-02-03 MED ORDER — ACETAMINOPHEN 325 MG PO TABS
650.0000 mg | ORAL_TABLET | Freq: Once | ORAL | Status: AC
  Administered 2021-02-03: 21:00:00 650 mg via ORAL
  Filled 2021-02-03: qty 2

## 2021-02-03 MED ORDER — PRIMIDONE 50 MG PO TABS
50.0000 mg | ORAL_TABLET | Freq: Two times a day (BID) | ORAL | Status: DC
  Administered 2021-02-03 – 2021-02-06 (×7): 50 mg via ORAL
  Filled 2021-02-03 (×8): qty 1

## 2021-02-03 MED ORDER — LEVOTHYROXINE SODIUM 50 MCG PO TABS
50.0000 ug | ORAL_TABLET | Freq: Every day | ORAL | Status: DC
  Administered 2021-02-03 – 2021-02-06 (×4): 50 ug via ORAL
  Filled 2021-02-03 (×4): qty 1

## 2021-02-03 MED ORDER — CALCIUM CARBONATE 1250 (500 CA) MG PO TABS
1250.0000 mg | ORAL_TABLET | Freq: Every day | ORAL | Status: DC
  Administered 2021-02-03 – 2021-02-06 (×4): 1250 mg via ORAL
  Filled 2021-02-03 (×5): qty 1

## 2021-02-03 MED ORDER — SODIUM CHLORIDE 0.9 % IV BOLUS
1000.0000 mL | Freq: Once | INTRAVENOUS | Status: AC
  Administered 2021-02-03: 01:00:00 1000 mL via INTRAVENOUS

## 2021-02-03 MED ORDER — GINKGO BILOBA 40 MG PO TABS: ORAL_TABLET | Freq: Every day | ORAL | Status: DC

## 2021-02-03 MED ORDER — DONEPEZIL HCL 5 MG PO TABS
10.0000 mg | ORAL_TABLET | Freq: Every day | ORAL | Status: DC
  Administered 2021-02-03 – 2021-02-05 (×3): 10 mg via ORAL
  Filled 2021-02-03 (×3): qty 2

## 2021-02-03 MED ORDER — FLAX SEED OIL 1000 MG PO CAPS: 1000.0000 mg | ORAL_CAPSULE | Freq: Every day | ORAL | Status: DC

## 2021-02-03 MED ORDER — AMOXICILLIN-POT CLAVULANATE 875-125 MG PO TABS
1.0000 | ORAL_TABLET | Freq: Two times a day (BID) | ORAL | Status: DC
Start: 2021-02-04 — End: 2021-02-06
  Administered 2021-02-04 – 2021-02-06 (×5): 1 via ORAL
  Filled 2021-02-03 (×5): qty 1

## 2021-02-03 MED ORDER — GARLIC 1000 MG PO CAPS: 1.0000 | ORAL_CAPSULE | Freq: Every morning | ORAL | Status: DC

## 2021-02-03 MED ORDER — FAMOTIDINE 20 MG PO TABS
20.0000 mg | ORAL_TABLET | Freq: Two times a day (BID) | ORAL | Status: DC
  Administered 2021-02-03 – 2021-02-06 (×7): 20 mg via ORAL
  Filled 2021-02-03 (×7): qty 1

## 2021-02-03 MED ORDER — ACETAMINOPHEN 500 MG PO TABS
1000.0000 mg | ORAL_TABLET | Freq: Once | ORAL | Status: AC
  Administered 2021-02-03: 1000 mg via ORAL
  Filled 2021-02-03: qty 2

## 2021-02-03 MED ORDER — SODIUM CHLORIDE 0.9 % IV SOLN: INTRAVENOUS | Status: AC

## 2021-02-03 MED ORDER — AZITHROMYCIN 500 MG PO TABS
500.0000 mg | ORAL_TABLET | Freq: Every day | ORAL | Status: AC
  Administered 2021-02-04 – 2021-02-05 (×2): 500 mg via ORAL
  Filled 2021-02-03 (×2): qty 1

## 2021-02-03 NOTE — ED Provider Notes (Signed)
[facility] Emergency Department Provider Note  ____________________________________________  Time seen: Approximately 1:04 AM  I have reviewed the triage vital signs and the nursing notes.   HISTORY  Chief Complaint Cough and Fever   HPI Allen Chang is a 87 y.o. male with a history of CLL currently not on treatment, CKD, hypertension, remote prostate cancer, hypothyroidism, A. fib who presents for evaluation of cough and fever.  Patient was diagnosed with COVID on 12/7. He was started on Molnupiravir and reports improvement of his symptoms while on the therapy however 2 days ago patient started having worsening cough and again fevers at home.  He reports that he has been having coughing fits but denies shortness of breath, chest pain, vomiting.  Also has had a low-grade fever for 2 days.  Past Medical History:  Diagnosis Date   Cancer ([place])    prostate    CKD (chronic kidney disease) stage 3, GFR 30-59 ml/min (HCC) 7/61/6073   Complication of anesthesia    bradycardia, in ICU for 24 hour after galbladder surgery.    Diverticulosis 2012   Dysrhythmia    Heart skips a beat   Elevated lipids    GERD (gastroesophageal reflux disease)    History of hiatal hernia    Hypertension    Hypothyroidism    Leukemia (HCC)    Prostate cancer ([place])    Skin cancer    face, scalp, behind ear,back and hand   Tremors of nervous system     Patient Active Problem List   Diagnosis Date Noted   Status post right partial knee replacement 09/25/2020   Lower GI bleed 05/03/2020   CKD (chronic kidney disease), stage III ([place]) 05/03/2020   Hiatal hernia 12/16/2019   Postural dizziness with presyncope 12/16/2019   New onset Atrial fibrillation with slow ventricular response ([place]) 12/16/2019   Chest pain 12/16/2019   Encounter for antineoplastic chemotherapy 10/22/2018   Goals of care, counseling/discussion 09/28/2018   Thrombocytopenia ([place]) 10/27/2016   Iron  deficiency 10/27/2016   CLL (chronic lymphocytic leukemia) ([place]) 10/27/2016   Macrocytic anemia 10/27/2016   Acquired hypothyroidism 10/01/2016   Chronic kidney disease (CKD) stage G3a/A1, moderately decreased glomerular filtration rate (GFR) between 45-59 mL/min/1.73 square meter and albuminuria creatinine ratio less than 30 mg/g (HCC) 10/01/2016   Status post left partial knee replacement 12/12/2014   Bradycardia 10/10/2014   Severe sinus bradycardia 10/10/2014   Benign essential hypertension 05/02/2013   Diabetes mellitus ([place]) 05/02/2013   Hyperlipidemia, unspecified 05/02/2013    Past Surgical History:  Procedure Laterality Date   BLADDER TUMOR EXCISION     CARDIAC CATHETERIZATION     CHOLECYSTECTOMY N/A 10/10/2014   Procedure: LAPAROSCOPIC CHOLECYSTECTOMY WITH INTRAOPERATIVE CHOLANGIOGRAM;  Surgeon: Leonie Green, MD;  Location: [facility] ORS;  Service: General;  Laterality: N/A;   COLONOSCOPY WITH PROPOFOL N/A 02/16/2017   Procedure: COLONOSCOPY WITH PROPOFOL;  Surgeon: Manya Silvas, MD;  Location: [facility] ENDOSCOPY;  Service: Endoscopy;  Laterality: N/A;   ESOPHAGOGASTRODUODENOSCOPY (EGD) WITH PROPOFOL N/A 02/16/2017   Procedure: ESOPHAGOGASTRODUODENOSCOPY (EGD) WITH PROPOFOL;  Surgeon: Manya Silvas, MD;  Location: [facility] ENDOSCOPY;  Service: Endoscopy;  Laterality: N/A;   HEMORRHOID SURGERY     HERNIA REPAIR Left    x2   JOINT REPLACEMENT Left    Partial Knee Replacement, Dr. Roland Rack   PARTIAL KNEE ARTHROPLASTY Left 12/12/2014   Procedure: UNICOMPARTMENTAL KNEE;  Surgeon: Corky Mull, MD;  Location: [facility] ORS;  Service: Orthopedics;  Laterality: Left;  PARTIAL KNEE ARTHROPLASTY Right 09/25/2020   Procedure: RIGHT PARTIAL KNEE REPLACEMENT;  Surgeon: Corky Mull, MD;  Location: [facility] ORS;  Service: Orthopedics;  Laterality: Right;   prostate seeding     SHOULDER ARTHROSCOPY Right    SHOULDER ARTHROSCOPY WITH OPEN ROTATOR CUFF REPAIR Left 02/07/2016   Procedure: SHOULDER  ARTHROSCOPY WITH OPEN ROTATOR CUFF REPAIR;  Surgeon: Corky Mull, MD;  Location: [facility] ORS;  Service: Orthopedics;  Laterality: Left;    Prior to Admission medications   Medication Sig Start Date End Date Taking? Authorizing Provider  acetaminophen (TYLENOL) 500 MG tablet Take 1,000 mg by mouth every 8 (eight) hours as needed for moderate pain.    [provider]  ascorbic acid (VITAMIN C) 1000 MG tablet Take 1,000 mg by mouth daily.    [provider]  b complex vitamins capsule Take 1 capsule by mouth daily.    [provider]  calcium carbonate (OSCAL) 1500 (600 Ca) MG TABS tablet Take 1,500 mg by mouth daily with breakfast.    [provider]  Cholecalciferol 25 MCG (1000 UT) tablet Take 1,000 Units by mouth daily.    [provider]  donepezil (ARICEPT) 10 MG tablet Take 10 mg by mouth at bedtime. 05/24/20   [provider]  DULoxetine (CYMBALTA) 20 MG capsule Take 20 mg by mouth daily. 06/08/19   [provider]  enoxaparin (LOVENOX) 40 MG/0.4ML injection Inject 0.4 mLs (40 mg total) into the skin daily. Patient not taking: Reported on 01/18/2021 09/26/20   Lattie Corns, PA-C  famotidine (PEPCID) 40 MG tablet Take 40 mg by mouth 2 (two) times daily. 04/28/19   [provider]  ferrous sulfate 325 (65 FE) MG EC tablet Take 325 mg by mouth 3 (three) times daily with meals.    [provider]  Flaxseed, Linseed, (FLAX SEED OIL) 1000 MG CAPS Take 1,000 mg by mouth daily.    [provider]  gabapentin (NEURONTIN) 100 MG capsule Take 100 mg by mouth 3 (three) times daily.  02/15/18   [provider]  Garlic 8416 MG CAPS Take 1 capsule by mouth every morning.    [provider]  Ginkgo Biloba (GINKOBA PO) Take 2 tablets by mouth daily.    [provider]  HYDROcodone-acetaminophen (NORCO/VICODIN) 5-325 MG tablet Take 1 tablet by mouth every 6 (six) hours as needed for moderate  pain (pain score 4-6). Patient not taking: Reported on 01/18/2021 09/26/20   Lattie Corns, PA-C  latanoprost (XALATAN) 0.005 % ophthalmic solution Place 1 drop into both eyes at bedtime.    [provider]  levothyroxine (SYNTHROID) 50 MCG tablet Take 50 mcg by mouth daily before breakfast. Take on an empty stomach with a glass of water at least 30-60 minutes before breakfast. 04/17/20 04/17/21  [provider]  loratadine (CLARITIN REDITABS) 10 MG dissolvable tablet Take 10 mg by mouth daily. In am    [provider]  Misc Natural Products (BLACK CHERRY CONCENTRATE) LIQD Take 15 mLs by mouth daily.     [provider]  MISC NATURAL PRODUCTS PO Take 2 capsules by mouth daily. Beet juice capsules    [provider]  Omega-3 Fatty Acids (FISH OIL) 1000 MG CAPS Take 1 capsule by mouth daily.    [provider]  ondansetron (ZOFRAN) 4 MG tablet Take 1 tablet (4 mg total) by mouth every 6 (six) hours as needed for nausea. 09/26/20   Lattie Corns, PA-C  OVER THE COUNTER MEDICATION Take 1 capsule by mouth daily. Neuriva    [provider]  OVER THE COUNTER MEDICATION Take 1 capsule by mouth daily. Ageless Brain    [provider]  OVER THE COUNTER MEDICATION Take 1,000 mg by mouth daily. BACOPA    [provider]  primidone (MYSOLINE) 50 MG tablet Take 50 mg by mouth 2 (two) times daily. 02/21/20 04/05/21  [provider]  propranolol (INDERAL) 10 MG tablet Take 10 mg by mouth 2 (two) times daily.    [provider]  traMADol (ULTRAM) 50 MG tablet Take 1 tablet (50 mg total) by mouth every 8 (eight) hours as needed for moderate pain. Patient not taking: Reported on 01/18/2021 09/26/20   Lattie Corns, PA-C  vitamin E 180 MG (400 UNITS) capsule Take 400 Units by mouth daily.    [provider]  zinc gluconate 50 MG tablet Take 50 mg by mouth daily.    [provider]     Allergies Oxycodone-acetaminophen and Voltaren [diclofenac sodium]  Family History  Problem Relation Age of Onset   Bone cancer Father    Hypertension Mother    Osteoporosis Mother    Breast cancer Sister    Cancer Brother    Cancer Brother    Lung cancer Brother    Bladder Cancer Brother    Melanoma Brother     Social History Social History   Tobacco Use   Smoking status: Never   Smokeless tobacco: Never  Vaping Use   Vaping Use: Never used  Substance Use Topics   Alcohol use: No   Drug use: No    Review of Systems  Constitutional: + fever. Eyes: Negative for visual changes. ENT: Negative for sore throat. Neck: No neck pain  Cardiovascular: Negative for chest pain.  Respiratory: Negative for shortness of breath. + cough Gastrointestinal: Negative for abdominal pain, vomiting or diarrhea. Genitourinary: Negative for dysuria. Musculoskeletal: Negative for back pain. Skin: Negative for rash. Neurological: Negative for headaches, weakness or numbness. Psych: No SI or HI  ____________________________________________   PHYSICAL EXAM:  VITAL SIGNS: ED Triage Vitals  Enc Vitals Group     BP 02/02/21 1736 119/65     Pulse Rate 02/02/21 1736 60     Resp 02/02/21 1736 18     Temp 02/02/21 1736 100.3 F (37.9 C)     Temp Source 02/02/21 1736 Oral     SpO2 02/02/21 1736 95 %     Weight 02/02/21 1738 180 lb 1.6 oz (81.7 kg)     Height 02/02/21 1738 5\' 9"  (1.753 m)     Head Circumference --      Peak Flow --      Pain Score 02/02/21 1737 0     Pain Loc --      Pain Edu? --      Excl. in [place]? --     Constitutional: Alert and oriented. Well appearing and in no apparent distress. HEENT:      Head: Normocephalic and atraumatic.         Eyes: Conjunctivae are normal. Sclera is non-icteric.       Mouth/Throat: Mucous membranes are moist.       Neck: Supple with no signs of meningismus. Cardiovascular: Regular rate and rhythm. No murmurs, gallops, or rubs.  2+ symmetrical distal pulses are present in all extremities. No JVD. Respiratory: Patient is actively coughing but in no respiratory distress, normal respiratory rate, normal sats, he does  have some faint wheezing Gastrointestinal: Soft, non tender, and non distended with positive bowel sounds. No rebound or guarding. Genitourinary: No CVA tenderness. Musculoskeletal:  No edema, cyanosis, or erythema of extremities. Neurologic: Normal speech and language. Face is symmetric. Moving all extremities. No gross focal neurologic deficits are appreciated. Skin: Skin is warm, dry and intact. No rash noted. Psychiatric: Mood and affect are normal. Speech and behavior are normal.  ____________________________________________   LABS (all labs ordered are listed, but only abnormal results are displayed)  Labs Reviewed  CBC WITH DIFFERENTIAL/PLATELET - Abnormal; Notable for the following components:      Result Value   WBC 10.8 (*)    RBC 3.61 (*)    Hemoglobin 12.5 (*)    HCT 37.5 (*)    MCV 103.9 (*)    MCH 34.6 (*)    Platelets 121 (*)    Lymphs Abs 5.9 (*)    Monocytes Absolute 2.2 (*)    All other components within normal limits  COMPREHENSIVE METABOLIC PANEL - Abnormal; Notable for the following components:   Sodium 129 (*)    Glucose, Bld 105 (*)    BUN 24 (*)    Creatinine, Ser 1.31 (*)    GFR, Estimated 53 (*)    All other components within normal limits  LIPASE, BLOOD  URINALYSIS, COMPLETE (UACMP) WITH MICROSCOPIC  TROPONIN I (HIGH SENSITIVITY)   ____________________________________________  EKG  ED ECG REPORT I, Rudene Re, the attending physician, personally viewed and interpreted this ECG.  Sinus rhythm with rate of 65, normal intervals, normal axis, no ST elevations or depressions ____________________________________________  RADIOLOGY  I have personally reviewed the images performed during this visit and I agree with the Radiologist's  read.   Interpretation by Radiologist:  DG Chest 2 View  Result Date: 02/02/2021 CLINICAL DATA:  Cough and fevers with recent COVID infection, initial encounter EXAM: CHEST - 2 VIEW COMPARISON:  02/19/2020 FINDINGS: Cardiac shadow is enlarged but stable. Large hiatal hernia is again seen. Aortic calcifications are noted. Lungs are well aerated bilaterally with basilar atelectatic changes on the left. No sizable effusion is seen. No bony abnormality is noted. IMPRESSION: Left basilar atelectasis. Large hiatal hernia. Electronically Signed   By: Inez Catalina M.D.   On: 02/02/2021 18:38   CT Angio Chest PE W and/or Wo Contrast  Result Date: 02/03/2021 CLINICAL DATA:  COVID pneumonia, cough, fever. Pulmonary embolism (PE) suspected, high prob EXAM: CT ANGIOGRAPHY CHEST WITH CONTRAST TECHNIQUE: Multidetector CT imaging of the chest was performed using the standard protocol during bolus administration of intravenous contrast. Multiplanar CT image reconstructions and MIPs were obtained to evaluate the vascular anatomy. CONTRAST:  66mL OMNIPAQUE IOHEXOL 350 MG/ML SOLN COMPARISON:  12/16/2019 FINDINGS: Cardiovascular: Adequate opacification of the pulmonary arterial tree. No intraluminal filling defect identified to suggest acute pulmonary embolism. Central pulmonary arteries are of normal caliber. Mild coronary artery calcification. Cardiac size within normal limits. No pericardial effusion. Mild atherosclerotic calcification within the thoracic aorta. No aortic aneurysm. Mediastinum/Nodes: Since the prior examination, there has developed pathologic mediastinal and left hilar adenopathy with a right paratracheal lymph node, by example, measuring up to 2.6 x 1.4 cm at axial image # 19/4. Pathologic adenopathy is seen within the right paratracheal, prevascular, aortopulmonary, subcarinal, and left hilar lymph node groups. Visualized thyroid is unremarkable. The esophagus is patulous, possibly reflecting changes of  esophageal dysmotility. Large hiatal hernia is present with the stomach largely within the thoracic compartment. Lungs/Pleura: There is focal collapse and  consolidation involving the basilar lingula and left lower lobe. Small left pleural effusion is present. Mild right basilar atelectasis. No pneumothorax. Central airways are widely patent. Upper Abdomen: No acute abnormality. Musculoskeletal: No acute bone abnormality. No lytic or blastic bone lesion. Review of the MIP images confirms the above findings. IMPRESSION: No pulmonary embolism. Mild coronary artery calcification. Focal consolidation within the basilar lingula and left lower lobe. Small associated left parapneumonic effusion. No central obstructing lesion. Interval development of pathologic mediastinal and left hilar adenopathy, possibly reactive in nature. Follow-up examination in 3-6 months with CT or PET CT examination may be helpful in documenting resolution was the patient's acute issues have resolved. Large hiatal hernia. Aortic Atherosclerosis (ICD10-I70.0). Electronically Signed   By: Fidela Salisbury M.D.   On: 02/03/2021 02:04     ____________________________________________   PROCEDURES  Procedure(s) performed:yes .1-3 Lead EKG Interpretation Performed by: Rudene Re, MD Authorized by: Rudene Re, MD     Interpretation: non-specific     ECG rate assessment: normal     Rhythm: sinus rhythm     Ectopy: none     Conduction: normal     Critical Care performed: yes  CRITICAL CARE Performed by: Rudene Re  ?  Total critical care time: 30 min  Critical care time was exclusive of separately billable procedures and treating other patients.  Critical care was necessary to treat or prevent imminent or life-threatening deterioration.  Critical care was time spent personally by me on the following activities: development of treatment plan with patient and/or surrogate as well as nursing, discussions with  consultants, evaluation of patient's response to treatment, examination of patient, obtaining history from patient or surrogate, ordering and performing treatments and interventions, ordering and review of laboratory studies, ordering and review of radiographic studies, pulse oximetry and re-evaluation of patient's condition.  ____________________________________________   INITIAL IMPRESSION / ASSESSMENT AND PLAN / ED COURSE  87 y.o. male with a history of CLL currently not on treatment, CKD, hypertension, remote prostate cancer, hypothyroidism, A. fib who presents for evaluation of cough and fever x 2 days. Diagnosed with Covid 19 eleven days ago, s/p molnupiravir. No CP, SOB, normal work of breathing normal sats with faint wheezes bilaterally.  Chest x-ray showing left basilar atelectasis but no signs of pneumonia.  Due to worsening of the symptoms and recent COVID infection we will get a CT to rule out pneumonia or PE.  Labs with mildly elevated white count of 10.8.  Mild hyponatremia with a sodium of 129.  Normal kidney function.  Patient looks slightly dry on exam.  According to the wife has been eating and drinking normally.  We will give a bolus of normal saline.  Patient was placed on telemetry for close monitoring of cardiorespiratory status.  Old medical records reviewed  _________________________ 2:20 AM on 02/03/2021 ----------------------------------------- CT consistent with PNA. Rocephin and azithromycin ordered. Oxygen 90-93% on RA after duoneb. Hyponatremia. Will admit to hospitalist      _____________________________________________ Please note:  Patient was evaluated in Emergency Department today for the symptoms described in the history of present illness. Patient was evaluated in the context of the global COVID-19 pandemic, which necessitated consideration that the patient might be at risk for infection with the SARS-CoV-2 virus that causes COVID-19. Institutional protocols and  algorithms that pertain to the evaluation of patients at risk for COVID-19 are in a state of rapid change based on information released by regulatory bodies including the CDC and federal and state organizations. These  policies and algorithms were followed during the patient's care in the ED.  Some ED evaluations and interventions may be delayed as a result of limited staffing during the pandemic.   [place] Controlled Substance Database was reviewed by me. ____________________________________________   FINAL CLINICAL IMPRESSION(S) / ED DIAGNOSES   Final diagnoses:  Community acquired pneumonia, unspecified laterality  Hyponatremia      NEW MEDICATIONS STARTED DURING THIS VISIT:  ED Discharge Orders     None        Note:  This document was prepared using Dragon voice recognition software and may include unintentional dictation errors.    Rudene Re, MD 02/03/21 [phone]

## 2021-02-03 NOTE — Progress Notes (Addendum)
No charge progress note.  Allen Chang is a 87 y.o. Caucasian male with medical history significant for stage III chronic kidney disease, GERD, hypertension, hypothyroidism and CLL apparently in remission as well as prostate cancer, who presented to the ER with acute onset of dyspnea with associated dry cough and wheezing for the last few days.  Patient was tested positive for COVID-19 on 01/23/2021 and was given a course of oral antiviral therapy.  On arrival he was have temperature of 100.3, otherwise stable.  Appears little dry with mild hyponatremia and creatinine of 1.31.  Some leukocytosis at 10.8.  Chest x-ray with left basal atelectasis and large hiatal hernia. CTA with no PE but did show concern for focal consolidation within the basilar lingula and left lower lobe, small associated left parapneumonic effusion.  Some pathologic mediastinal and left hilar lymphadenopathy, post likely reactive but they are recommending follow-up in 3 to 23-month with CT or PET scan.  Patient did developed some watery diarrhea per nursing concern.  Patient denies any abdominal pain, nausea or vomiting.  He wants to go home.  On exam he was a frail elderly man, chest was clear and no lower extremity edema.  Antibiotic switched to Augmentin and Zithromax. Repeat labs to see the resolution of mild hyponatremia which was most likely secondary to dehydration as he appears dry. Give him 1 more liter of fluid. Check for C. difficile if continue to have diarrhea. C. difficile antigen is positive, toxin negative which makes it indeterminate.  Pending PCR  If remains stable-can be discharged home tomorrow.

## 2021-02-03 NOTE — ED Notes (Signed)
Awake and alert. Orientated to self only. Per handoff report, dementia and at baseline. Loose non productive cough noted. Denies pain. VS reassessed. Sinus Rhythm HR 67. On RA with saturation 95%. RR even and nonlabored. Skin p/w/d.

## 2021-02-03 NOTE — Consult Note (Signed)
ANTICOAGULATION CONSULT NOTE  Pharmacy Consult for Lovenox Indication: VTE prophylaxis  Patient Measurements: Height: 5\' 9"  (175.3 cm) Weight: 81.7 kg (180 lb 1.6 oz) IBW/kg (Calculated) : 70.7  Labs: Recent Labs    02/02/21 1747 02/02/21 1932  HGB 12.5*  --   HCT 37.5*  --   PLT 121*  --   CREATININE 1.31*  --   TROPONINIHS  --  12    Estimated Creatinine Clearance: 40.5 mL/min (A) (by C-G formula based on SCr of 1.31 mg/dL (H)).   Medical History: Past Medical History:  Diagnosis Date   Cancer ([place])    prostate    CKD (chronic kidney disease) stage 3, GFR 30-59 ml/min (HCC) 10/05/5907   Complication of anesthesia    bradycardia, in ICU for 24 hour after galbladder surgery.    Diverticulosis 2012   Dysrhythmia    Heart skips a beat   Elevated lipids    GERD (gastroesophageal reflux disease)    History of hiatal hernia    Hypertension    Hypothyroidism    Leukemia (HCC)    Prostate cancer (HCC)    Skin cancer    face, scalp, behind ear,back and hand   Tremors of nervous system     Medications:  No anticoagulation prior to admission per my chart review  Assessment: Patient is an 87 y/o M with medical history as above who is admitted with community acquired pneumonia in setting of positive COVID-19 test. Pharmacy consulted for Lovenox dosing for VTE prophylaxis.  Plan:  --Lovenox 40 mg daily --Scr at least weekly --CBC per protocol  Benita Gutter 02/03/2021,10:55 AM

## 2021-02-03 NOTE — ED Notes (Signed)
RN to bedside to clean BM. New sheets and brief.

## 2021-02-03 NOTE — ED Notes (Signed)
Pt assisted to toile to have BM. Liquid stool and green in color.

## 2021-02-03 NOTE — Progress Notes (Signed)
PHARMACIST - PHYSICIAN ORDER COMMUNICATION  CONCERNING: P&T Medication Policy on Herbal Medications  DESCRIPTION:  This patients order for:  Flax Seed Oil CAPS 4,174 mg, Garlic CAPS 0,814 mg, &  Ginkgo Biloba TABS have been noted.  This product(s) is classified as an herbal or natural product. Due to a lack of definitive safety studies or FDA approval, nonstandard manufacturing practices, plus the potential risk of unknown drug-drug interactions while on inpatient medications, the Pharmacy and Therapeutics Committee does not permit the use of herbal or natural products of this type within [facility].   ACTION TAKEN: The pharmacy department is unable to verify this order at this time. Please reevaluate patients clinical condition at discharge and address if the herbal or natural product(s) should be resumed at that time.   Renda Rolls, PharmD, [facility] 02/03/2021 3:17 AM

## 2021-02-03 NOTE — ED Notes (Signed)
Patient having difficulty using urinal with assistance.  External (Condom) catheter applied.  Patient provided with warm blankets and repositioned in bed.

## 2021-02-03 NOTE — ED Notes (Signed)
Called daughter with update.  Allen Chang [phone] Call when ready to be DC>

## 2021-02-03 NOTE — ED Notes (Signed)
RN to bedside to find pt standing at toilet and diarrhea all over floor. Pt was cleaned up. Placed back in bed and new brief.

## 2021-02-03 NOTE — H&P (Signed)
[place]   PATIENT NAME: Allen Chang    MR#:  [id number]  DATE OF BIRTH:  [birth year]  DATE OF ADMISSION:  02/03/2021  PRIMARY CARE PHYSICIAN: Tracie Harrier, MD   Patient is coming from: Home  REQUESTING/REFERRING PHYSICIAN: Rudene Re, MD  CHIEF COMPLAINT:   Chief Complaint  Patient presents with   Cough   Fever    HISTORY OF PRESENT ILLNESS:  Allen Chang is a 87 y.o. Caucasian male with medical history significant for stage III chronic kidney disease, GERD, hypertension, hypothyroidism and CLL apparently in remission as well as prostate cancer, who presented to the ER with acute onset of dyspnea with associated dry cough and wheezing for the last few days.  He admits to diaphoresis with fever and chills.  No nausea or vomiting or abdominal pain or diarrhea.  No dysuria, oliguria or hematuria or flank pain.  He was recently admitted COVID-19 was discharged on oral antiviral therapy.  ED Course: Upon presentation to the emergency room temperature was 100.3 and otherwise vital signs were within normal.  Labs revealed hyponatremia 129 and a BUN of 24 with creatinine 1.31 comparable to previous level.  CBC showed WC of 10.8 and hemoglobin of 12.5 and hematocrit of 37.5 with platelets of 121.   EKG as reviewed by me :  EKG showed normal sinus rhythm with a rate of 65 with Q waves inferiorly. Imaging: 2 view chest ray showed left basal atelectasis and large hiatal hernia.  Chest CTA revealed: No pulmonary embolism.   Mild coronary artery calcification.   Focal consolidation within the basilar lingula and left lower lobe. Small associated left parapneumonic effusion. No central obstructing lesion.   Interval development of pathologic mediastinal and left hilar adenopathy, possibly reactive in nature. Follow-up examination in 3-6 months with CT or PET CT examination may be helpful in documenting resolution was the patient's acute issues have resolved.    Large hiatal hernia.   Aortic Atherosclerosis.  The patient was given IV Rocephin and Zithromax, duo nebs, 1 L bolus of IV normal saline and 1 g of p.o. Tylenol.  He will be admitted to a medical telemetry bed for further evaluation and management  PAST MEDICAL HISTORY:   Past Medical History:  Diagnosis Date   Cancer ([place])    prostate    CKD (chronic kidney disease) stage 3, GFR 30-59 ml/min ([place]) 05/29/8784   Complication of anesthesia    bradycardia, in ICU for 24 hour after galbladder surgery.    Diverticulosis 2012   Dysrhythmia    Heart skips a beat   Elevated lipids    GERD (gastroesophageal reflux disease)    History of hiatal hernia    Hypertension    Hypothyroidism    Leukemia ([facility])    Prostate cancer ([place])    Skin cancer    face, scalp, behind ear,back and hand   Tremors of nervous system     PAST SURGICAL HISTORY:   Past Surgical History:  Procedure Laterality Date   BLADDER TUMOR EXCISION     CARDIAC CATHETERIZATION     CHOLECYSTECTOMY N/A 10/10/2014   Procedure: LAPAROSCOPIC CHOLECYSTECTOMY WITH INTRAOPERATIVE CHOLANGIOGRAM;  Surgeon: Leonie Green, MD;  Location: [facility] ORS;  Service: General;  Laterality: N/A;   COLONOSCOPY WITH PROPOFOL N/A 02/16/2017   Procedure: COLONOSCOPY WITH PROPOFOL;  Surgeon: Manya Silvas, MD;  Location[facility] ENDOSCOPY;  Service: Endoscopy;  Laterality: N/A;   ESOPHAGOGASTRODUODENOSCOPY (EGD) WITH PROPOFOL N/A 02/16/2017  Procedure: ESOPHAGOGASTRODUODENOSCOPY (EGD) WITH PROPOFOL;  Surgeon: Manya Silvas, MD;  Location: [facility] ENDOSCOPY;  Service: Endoscopy;  Laterality: N/A;   HEMORRHOID SURGERY     HERNIA REPAIR Left    x2   JOINT REPLACEMENT Left    Partial Knee Replacement, Dr. Roland Rack   PARTIAL KNEE ARTHROPLASTY Left 12/12/2014   Procedure: UNICOMPARTMENTAL KNEE;  Surgeon: Corky Mull, MD;  Location: [facility] ORS;  Service: Orthopedics;  Laterality: Left;   PARTIAL KNEE ARTHROPLASTY Right 09/25/2020   Procedure:  RIGHT PARTIAL KNEE REPLACEMENT;  Surgeon: Corky Mull, MD;  Location: [facility] ORS;  Service: Orthopedics;  Laterality: Right;   prostate seeding     SHOULDER ARTHROSCOPY Right    SHOULDER ARTHROSCOPY WITH OPEN ROTATOR CUFF REPAIR Left 02/07/2016   Procedure: SHOULDER ARTHROSCOPY WITH OPEN ROTATOR CUFF REPAIR;  Surgeon: Corky Mull, MD;  Location: [facility] ORS;  Service: Orthopedics;  Laterality: Left;    SOCIAL HISTORY:   Social History   Tobacco Use   Smoking status: Never   Smokeless tobacco: Never  Substance Use Topics   Alcohol use: No    FAMILY HISTORY:   Family History  Problem Relation Age of Onset   Bone cancer Father    Hypertension Mother    Osteoporosis Mother    Breast cancer Sister    Cancer Brother    Cancer Brother    Lung cancer Brother    Bladder Cancer Brother    Melanoma Brother     DRUG ALLERGIES:   Allergies  Allergen Reactions   Oxycodone-Acetaminophen Other (See Comments)    Other reaction(s): Hallucinations     Voltaren [Diclofenac Sodium] Palpitations    REVIEW OF SYSTEMS:   ROS As per history of present illness. All pertinent systems were reviewed above. Constitutional, HEENT, cardiovascular, respiratory, GI, GU, musculoskeletal, neuro, psychiatric, endocrine, integumentary and hematologic systems were reviewed and are otherwise negative/unremarkable except for positive findings mentioned above in the HPI.   MEDICATIONS AT HOME:   Prior to Admission medications   Medication Sig Start Date End Date Taking? Authorizing Provider  acetaminophen (TYLENOL) 500 MG tablet Take 1,000 mg by mouth every 8 (eight) hours as needed for moderate pain.   Yes [provider]  ascorbic acid (VITAMIN C) 1000 MG tablet Take 1,000 mg by mouth daily.   Yes [provider]  b complex vitamins capsule Take 1 capsule by mouth daily.   Yes [provider]  benzonatate (TESSALON) 200 MG capsule Take 1 capsule by mouth 3 (three) times  daily as needed. 01/23/21  Yes [provider]  calcium carbonate (OSCAL) 1500 (600 Ca) MG TABS tablet Take 1,500 mg by mouth daily with breakfast.   Yes [provider]  Cholecalciferol 25 MCG (1000 UT) tablet Take 1,000 Units by mouth daily.   Yes [provider]  donepezil (ARICEPT) 10 MG tablet Take 10 mg by mouth at bedtime. 05/24/20  Yes [provider]  DULoxetine (CYMBALTA) 20 MG capsule Take 20 mg by mouth daily. 06/08/19  Yes [provider]  famotidine (PEPCID) 40 MG tablet Take 40 mg by mouth 2 (two) times daily. 04/28/19  Yes [provider]  ferrous sulfate 325 (65 FE) MG EC tablet Take 325 mg by mouth 3 (three) times daily with meals.   Yes [provider]  Flaxseed, Linseed, (FLAX SEED OIL) 1000 MG CAPS Take 1,000 mg by mouth daily.   Yes [provider]  gabapentin (NEURONTIN) 100 MG capsule Take 100 mg  by mouth 3 (three) times daily.  02/15/18  Yes [provider]  Garlic 9373 MG CAPS Take 1 capsule by mouth every morning.   Yes [provider]  Ginkgo Biloba (GINKOBA PO) Take 2 tablets by mouth daily.   Yes [provider]  latanoprost (XALATAN) 0.005 % ophthalmic solution Place 1 drop into both eyes at bedtime.   Yes [provider]  levothyroxine (SYNTHROID) 50 MCG tablet Take 50 mcg by mouth daily before breakfast. Take on an empty stomach with a glass of water at least 30-60 minutes before breakfast. 04/17/20 04/17/21 Yes [provider]  loratadine (CLARITIN REDITABS) 10 MG dissolvable tablet Take 10 mg by mouth daily. In am   Yes [provider]  Misc Natural Products (BLACK CHERRY CONCENTRATE) LIQD Take 15 mLs by mouth daily.    Yes [provider]  MISC NATURAL PRODUCTS PO Take 2 capsules by mouth daily. Beet juice capsules   Yes [provider]  Omega-3 Fatty Acids (FISH OIL) 1000 MG CAPS Take 1 capsule by mouth daily.   Yes [provider]  OVER THE COUNTER MEDICATION Take 1 capsule by mouth daily. Neuriva   Yes [provider]  OVER THE COUNTER MEDICATION Take 1 capsule by mouth daily. Ageless Brain   Yes [provider]  OVER THE COUNTER MEDICATION Take 1,000 mg by mouth daily. BACOPA   Yes [provider]  PAXLOVID, 150/100, 10 x 150 MG & 10 x 100MG  TBPK Take 2 tablets by mouth 2 (two) times daily. 01/23/21  Yes [provider]  primidone (MYSOLINE) 50 MG tablet Take 50 mg by mouth 2 (two) times daily. 02/21/20 04/05/21 Yes [provider]  propranolol (INDERAL) 10 MG tablet Take 10 mg by mouth 2 (two) times daily.   Yes [provider]  vitamin E 180 MG (400 UNITS) capsule Take 400 Units by mouth daily.   Yes [provider]  zinc gluconate 50 MG tablet Take 50 mg by mouth daily.   Yes [provider]      VITAL SIGNS:  Blood pressure 134/71, pulse (!) 41, temperature 98.9 F (37.2 C), temperature source Oral, resp. rate 17, height 5\' 9"  (1.753 m), weight 81.7 kg, SpO2 92 %.  PHYSICAL EXAMINATION:  Physical Exam  GENERAL:  87 y.o.-year-old Caucasian male patient lying in the bed with no acute distress.  EYES: Pupils equal, round, reactive to light and accommodation. No scleral icterus. Extraocular muscles intact.  HEENT: Head atraumatic, normocephalic. Oropharynx and nasopharynx clear.  NECK:  Supple, no jugular venous distention. No thyroid enlargement, no tenderness.  LUNGS: Diminished left basal and mid lung zone breath sounds with associated mild crackles.  No use of accessory muscles of respiration.  CARDIOVASCULAR: Regular rate and rhythm, S1, S2 normal. No murmurs, rubs, or gallops.  ABDOMEN: Soft, nondistended, nontender. Bowel sounds present. No organomegaly or mass.  EXTREMITIES: No pedal edema, cyanosis, or clubbing.  NEUROLOGIC: Cranial nerves II through XII are intact. Muscle strength 5/5 in all extremities. Sensation intact.  Gait not checked.  PSYCHIATRIC: The patient is alert and oriented x 3.  Normal affect and good eye contact. SKIN: No obvious rash, lesion, or ulcer.   LABORATORY PANEL:   CBC Recent Labs  Lab 02/02/21 1747  WBC 10.8*  HGB 12.5*  HCT 37.5*  PLT 121*   ------------------------------------------------------------------------------------------------------------------  Chemistries  Recent Labs  Lab 02/02/21 1747  NA 129*  K 4.8  CL 98  CO2 23  GLUCOSE 105*  BUN 24*  CREATININE 1.31*  CALCIUM 9.5  AST 22  ALT 18  ALKPHOS 86  BILITOT 0.7   ------------------------------------------------------------------------------------------------------------------  Cardiac Enzymes No results for input(s): TROPONINI in the last 168 hours. ------------------------------------------------------------------------------------------------------------------  RADIOLOGY:  DG Chest 2 View  Result Date: 02/02/2021 CLINICAL DATA:  Cough and fevers with recent COVID infection, initial encounter EXAM: CHEST - 2 VIEW COMPARISON:  02/19/2020 FINDINGS: Cardiac shadow is enlarged but stable. Large hiatal hernia is again seen. Aortic calcifications are noted. Lungs are well aerated bilaterally with basilar atelectatic changes on the left. No sizable effusion is seen. No bony abnormality is noted. IMPRESSION: Left basilar atelectasis. Large hiatal hernia. Electronically Signed   By: Inez Catalina M.D.   On: 02/02/2021 18:38   CT Angio Chest PE W and/or Wo Contrast  Result Date: 02/03/2021 CLINICAL DATA:  COVID pneumonia, cough, fever. Pulmonary embolism (PE) suspected, high prob EXAM: CT ANGIOGRAPHY CHEST WITH CONTRAST TECHNIQUE: Multidetector CT imaging of the chest was performed using the standard protocol during bolus administration of intravenous contrast. Multiplanar CT image reconstructions and MIPs were obtained to evaluate the vascular anatomy. CONTRAST:  10mL OMNIPAQUE IOHEXOL 350 MG/ML SOLN  COMPARISON:  12/16/2019 FINDINGS: Cardiovascular: Adequate opacification of the pulmonary arterial tree. No intraluminal filling defect identified to suggest acute pulmonary embolism. Central pulmonary arteries are of normal caliber. Mild coronary artery calcification. Cardiac size within normal limits. No pericardial effusion. Mild atherosclerotic calcification within the thoracic aorta. No aortic aneurysm. Mediastinum/Nodes: Since the prior examination, there has developed pathologic mediastinal and left hilar adenopathy with a right paratracheal lymph node, by example, measuring up to 2.6 x 1.4 cm at axial image # 19/4. Pathologic adenopathy is seen within the right paratracheal, prevascular, aortopulmonary, subcarinal, and left hilar lymph node groups. Visualized thyroid is unremarkable. The esophagus is patulous, possibly reflecting changes of esophageal dysmotility. Large hiatal hernia is present with the stomach largely within the thoracic compartment. Lungs/Pleura: There is focal collapse and consolidation involving the basilar lingula and left lower lobe. Small left pleural effusion is present. Mild right basilar atelectasis. No pneumothorax. Central airways are widely patent. Upper Abdomen: No acute abnormality. Musculoskeletal: No acute bone abnormality. No lytic or blastic bone lesion. Review of the MIP images confirms the above findings. IMPRESSION: No pulmonary embolism. Mild coronary artery calcification. Focal consolidation within the basilar lingula and left lower lobe. Small associated left parapneumonic effusion. No central obstructing lesion. Interval development of pathologic mediastinal and left hilar adenopathy, possibly reactive in nature. Follow-up examination in 3-6 months with CT or PET CT examination may be helpful in documenting resolution was the patient's acute issues have resolved. Large hiatal hernia. Aortic Atherosclerosis (ICD10-I70.0). Electronically Signed   By: Fidela Salisbury  M.D.   On: 02/03/2021 02:04      IMPRESSION AND PLAN:  Principal Problem:   CAP (community acquired pneumonia)  1.  Left lingular community-acquired pneumonia. - The patient be admitted to a medical telemetry bed. - We will continue antibiotic therapy with IV Rocephin and Zithromax. - We will follow blood and sputum culture. - Mucolytic therapy will be provided as well as bronchodilator therapy.  2.  Hyponatremia. - This is likely hypovolemic. - The patient will be placed on hydration with IV normal saline and will follow his BMP.  3.  Stage IIIa chronic kidney disease. - Renal functions have been stable. - The patient will be hydrated with IV normal saline and will follow his BMP.  4.  GERD. -  We will continue H2 blocker therapy.  5.  Cognitive dysfunction/dementia. - We will continue Aricept.  6.  Hypothyroidism. - We will continue Synthroid.  7.  Benign essential tremor. - We will continue his Mysoline.  DVT prophylaxis: Lovenox. Code Status: full code. Family Communication:  The plan of care was discussed in details with the patient (and family). I answered all questions. The patient agreed to proceed with the above mentioned plan. Further management will depend upon hospital course. Disposition Plan: Back to previous home environment Consults called: none. All the records are reviewed and case discussed with ED provider.  Status is: Inpatient   Remains inpatient appropriate because:Ongoing diagnostic testing needed not appropriate for outpatient work up, Unsafe d/c plan, IV treatments appropriate due to intensity of illness or inability to take PO, and Inpatient level of care appropriate due to severity of illness   Dispo: The patient is from: Home              Anticipated d/c is to: Home              Patient currently is not medically stable to d/c.              Difficult to place patient: No    Christel Mormon M.D on 02/03/2021 at 5:01 AM  [facility]    From 7 PM-7 AM, contact night-coverage [email or web address]  CC: Primary care physician; Tracie Harrier, MD

## 2021-02-03 NOTE — ED Notes (Signed)
Pt brief changed.  

## 2021-02-04 LAB — URINALYSIS, COMPLETE (UACMP) WITH MICROSCOPIC
Bilirubin Urine: NEGATIVE
Glucose, UA: NEGATIVE mg/dL
Hgb urine dipstick: NEGATIVE
Ketones, ur: 5 mg/dL — AB
Leukocytes,Ua: NEGATIVE
Nitrite: NEGATIVE
Protein, ur: NEGATIVE mg/dL
Specific Gravity, Urine: 1.012 (ref 1.005–1.030)
pH: 5 (ref 5.0–8.0)

## 2021-02-04 MED ORDER — ACETAMINOPHEN 325 MG PO TABS
650.0000 mg | ORAL_TABLET | Freq: Once | ORAL | Status: AC
  Administered 2021-02-04: 08:00:00 650 mg via ORAL
  Filled 2021-02-04: qty 2

## 2021-02-04 NOTE — Progress Notes (Signed)
PROGRESS NOTE    Allen Chang  PNT:[id number] DOB: [birth year] DOA: 02/03/2021 PCP: Tracie Harrier, MD   Brief Narrative: Taken from prior notes. Allen Chang is a 87 y.o. Caucasian male with medical history significant for stage III chronic kidney disease, GERD, hypertension, hypothyroidism and CLL apparently in remission as well as prostate cancer, who presented to the ER with acute onset of dyspnea with associated dry cough and wheezing for the last few days.  Patient was tested positive for COVID-19 on 01/23/2021 and was given a course of oral antiviral therapy. Patient was febrile on arrival.  Chest x-ray with left basal atelectasis and large hiatal hernia.  CTA was negative for PE but there was some concern of focal consolidation within the basilar lingula and left lower lobe with some associated left parapneumonic effusion.  He was initially started on ceftriaxone and Zithromax and then later transitioned to Augmentin and Zithromax for a possible discharge. PT is recommending SNF.  CT chest with also some concerning pathologic mediastinal and left hilar lymphadenopathy, most likely reactive but they are recommending follow-up in 3 to 6 months with CT or PET scan.  Patient also developed watery diarrhea yesterday, which has been resolved.  C. difficile antigen was positive only, PCR and toxin gene negative.  Subjective: Patient was seen and examined today.  Denies any complaints.  He did become febrile again earlier this morning.  Very hard of hearing.  Assessment & Plan:   Principal Problem:   CAP (community acquired pneumonia)  Left lingular community-acquired pneumonia.  Remained on room air.  Did develop another episode of fever overnight. -Blood and urine culture. -Supportive care -Continue with Augmentin and Zithromax-we will switch to Unasyn if continue to have fevers.  Cognitive dysfunction/dementia. -Continue home dose of Aricept -PT is recommending  SNF  Diarrhea.  Resolved.  Only C. difficile antigen positive, toxin and PCR negative.  Hypothyroidism. -Continue Synthroid  Benign essential tremors. -Continue home dose of Mysoline.  Objective: Vitals:   02/04/21 1345 02/04/21 1400 02/04/21 1430 02/04/21 1500  BP:    (!) 152/70  Pulse: (!) 58 63 (!) 57 (!) 55  Resp: (!) 24 (!) 22 (!) 21 14  Temp:      TempSrc:      SpO2: 96% 99% 92% 91%  Weight:      Height:        Intake/Output Summary (Last 24 hours) at 02/04/2021 1547 Last data filed at 02/04/2021 0867 Gross per 24 hour  Intake 1000 ml  Output 275 ml  Net 725 ml   Filed Weights   02/02/21 1738  Weight: 81.7 kg    Examination:  General exam: Appears calm and comfortable  Respiratory system: Clear to auscultation. Respiratory effort normal. Cardiovascular system: S1 & S2 heard, RRR.  Gastrointestinal system: Soft, nontender, nondistended, bowel sounds positive. Central nervous system: Alert and oriented to self only. No focal neurological deficits. Extremities: No edema, no cyanosis, pulses intact and symmetrical. Psychiatry: Judgement and insight appear impaired  DVT prophylaxis: Lovenox Code Status: Full Family Communication:  Disposition Plan:  Status is: Inpatient  Remains inpatient appropriate because: Severity of illness   Level of care: Telemetry Medical  All the records are reviewed and case discussed with Care Management/Social Worker. Management plans discussed with the patient, nursing and they are in agreement.  Consultants:  None  Procedures:  Antimicrobials:  Augmentin Zithromax  Data Reviewed: I have personally reviewed following labs and imaging studies  CBC: Recent Labs  Lab  02/02/21 1747 02/03/21 1701  WBC 10.8* 8.2  NEUTROABS 2.4  --   HGB 12.5* 11.5*  HCT 37.5* 34.2*  MCV 103.9* 103.6*  PLT 121* 950*   Basic Metabolic Panel: Recent Labs  Lab 02/02/21 1747 02/03/21 1701  NA 129* 134*  K 4.8 4.1  CL 98 102   CO2 23 26  GLUCOSE 105* 106*  BUN 24* 16  CREATININE 1.31* 1.07  CALCIUM 9.5 8.6*   GFR: Estimated Creatinine Clearance: 49.6 mL/min (by C-G formula based on SCr of 1.07 mg/dL). Liver Function Tests: Recent Labs  Lab 02/02/21 1747  AST 22  ALT 18  ALKPHOS 86  BILITOT 0.7  PROT 7.3  ALBUMIN 3.6   Recent Labs  Lab 02/02/21 1747  LIPASE 32   No results for input(s): AMMONIA in the last 168 hours. Coagulation Profile: No results for input(s): INR, PROTIME in the last 168 hours. Cardiac Enzymes: No results for input(s): CKTOTAL, CKMB, CKMBINDEX, TROPONINI in the last 168 hours. BNP (last 3 results) No results for input(s): PROBNP in the last 8760 hours. HbA1C: No results for input(s): HGBA1C in the last 72 hours. CBG: No results for input(s): GLUCAP in the last 168 hours. Lipid Profile: No results for input(s): CHOL, HDL, LDLCALC, TRIG, CHOLHDL, LDLDIRECT in the last 72 hours. Thyroid Function Tests: No results for input(s): TSH, T4TOTAL, FREET4, T3FREE, THYROIDAB in the last 72 hours. Anemia Panel: No results for input(s): VITAMINB12, FOLATE, FERRITIN, TIBC, IRON, RETICCTPCT in the last 72 hours. Sepsis Labs: Recent Labs  Lab 02/03/21 1701  PROCALCITON <0.10    Recent Results (from the past 240 hour(s))  Resp Panel by RT-PCR (Flu A&B, Covid) Nasopharyngeal Swab     Status: Abnormal   Collection Time: 02/03/21  5:29 AM   Specimen: Nasopharyngeal Swab; Nasopharyngeal(NP) swabs in vial transport medium  Result Value Ref Range Status   SARS Coronavirus 2 by RT PCR POSITIVE (A) NEGATIVE Final    Comment: (NOTE) SARS-CoV-2 target nucleic acids are DETECTED.  The SARS-CoV-2 RNA is generally detectable in upper respiratory specimens during the acute phase of infection. Positive results are indicative of the presence of the identified virus, but do not rule out bacterial infection or co-infection with other pathogens not detected by the test. Clinical correlation  with patient history and other diagnostic information is necessary to determine patient infection status. The expected result is Negative.  Fact Sheet for Patients: EntrepreneurPulse.com.au  Fact Sheet for Healthcare Providers: IncredibleEmployment.be  This test is not yet approved or cleared by the [place] FDA and  has been authorized for detection and/or diagnosis of SARS-CoV-2 by FDA under an Emergency Use Authorization (EUA).  This EUA will remain in effect (meaning this test can be used) for the duration of  the COVID-19 declaration under Section 564(b)(1) of the A ct, 21 U.S.C. section 360bbb-3(b)(1), unless the authorization is terminated or revoked sooner.     Influenza A by PCR NEGATIVE NEGATIVE Final   Influenza B by PCR NEGATIVE NEGATIVE Final    Comment: (NOTE) The Xpert Xpress SARS-CoV-2/FLU/RSV plus assay is intended as an aid in the diagnosis of influenza from Nasopharyngeal swab specimens and should not be used as a sole basis for treatment. Nasal washings and aspirates are unacceptable for Xpert Xpress SARS-CoV-2/FLU/RSV testing.  Fact Sheet for Patients: EntrepreneurPulse.com.au  Fact Sheet for Healthcare Providers: IncredibleEmployment.be  This test is not yet approved or cleared by the [place] FDA and has been authorized for detection and/or diagnosis of SARS-CoV-2  by FDA under an Emergency Use Authorization (EUA). This EUA will remain in effect (meaning this test can be used) for the duration of the COVID-19 declaration under Section 564(b)(1) of the Act, 21 U.S.C. section 360bbb-3(b)(1), unless the authorization is terminated or revoked.  Performed at [facility], [place], [place] [address]   C Difficile Quick Screen w PCR reflex     Status: Abnormal   Collection Time: 02/03/21  1:55 PM   Specimen: STOOL  Result Value Ref Range Status   C  Diff antigen POSITIVE (A) NEGATIVE Final   C Diff toxin NEGATIVE NEGATIVE Final   C Diff interpretation Results are indeterminate. See PCR results.  Final    Comment: Performed at [facility], [address] [place], [place] [address]  C. Diff by PCR, Reflexed     Status: None   Collection Time: 02/03/21  1:55 PM  Result Value Ref Range Status   Toxigenic C. Difficile by PCR NEGATIVE NEGATIVE Final    Comment: Patient is colonized with non toxigenic C. difficile. May not need treatment unless significant symptoms are present. Performed at [facility], [address] [place],  [address]      Radiology Studies: DG Chest 2 View  Result Date: 02/02/2021 CLINICAL DATA:  Cough and fevers with recent COVID infection, initial encounter EXAM: CHEST - 2 VIEW COMPARISON:  02/19/2020 FINDINGS: Cardiac shadow is enlarged but stable. Large hiatal hernia is again seen. Aortic calcifications are noted. Lungs are well aerated bilaterally with basilar atelectatic changes on the left. No sizable effusion is seen. No bony abnormality is noted. IMPRESSION: Left basilar atelectasis. Large hiatal hernia. Electronically Signed   By: Inez Catalina M.D.   On: 02/02/2021 18:38   CT Angio Chest PE W and/or Wo Contrast  Result Date: 02/03/2021 CLINICAL DATA:  COVID pneumonia, cough, fever. Pulmonary embolism (PE) suspected, high prob EXAM: CT ANGIOGRAPHY CHEST WITH CONTRAST TECHNIQUE: Multidetector CT imaging of the chest was performed using the standard protocol during bolus administration of intravenous contrast. Multiplanar CT image reconstructions and MIPs were obtained to evaluate the vascular anatomy. CONTRAST:  75mL OMNIPAQUE IOHEXOL 350 MG/ML SOLN COMPARISON:  12/16/2019 FINDINGS: Cardiovascular: Adequate opacification of the pulmonary arterial tree. No intraluminal filling defect identified to suggest acute pulmonary embolism. Central pulmonary arteries are of normal caliber. Mild  coronary artery calcification. Cardiac size within normal limits. No pericardial effusion. Mild atherosclerotic calcification within the thoracic aorta. No aortic aneurysm. Mediastinum/Nodes: Since the prior examination, there has developed pathologic mediastinal and left hilar adenopathy with a right paratracheal lymph node, by example, measuring up to 2.6 x 1.4 cm at axial image # 19/4. Pathologic adenopathy is seen within the right paratracheal, prevascular, aortopulmonary, subcarinal, and left hilar lymph node groups. Visualized thyroid is unremarkable. The esophagus is patulous, possibly reflecting changes of esophageal dysmotility. Large hiatal hernia is present with the stomach largely within the thoracic compartment. Lungs/Pleura: There is focal collapse and consolidation involving the basilar lingula and left lower lobe. Small left pleural effusion is present. Mild right basilar atelectasis. No pneumothorax. Central airways are widely patent. Upper Abdomen: No acute abnormality. Musculoskeletal: No acute bone abnormality. No lytic or blastic bone lesion. Review of the MIP images confirms the above findings. IMPRESSION: No pulmonary embolism. Mild coronary artery calcification. Focal consolidation within the basilar lingula and left lower lobe. Small associated left parapneumonic effusion. No central obstructing lesion. Interval development of pathologic mediastinal and left hilar adenopathy, possibly reactive in  nature. Follow-up examination in 3-6 months with CT or PET CT examination may be helpful in documenting resolution was the patient's acute issues have resolved. Large hiatal hernia. Aortic Atherosclerosis (ICD10-I70.0). Electronically Signed   By: Fidela Salisbury M.D.   On: 02/03/2021 02:04    Scheduled Meds:  amoxicillin-clavulanate  1 tablet Oral Q12H   ascorbic acid  1,000 mg Oral Daily   azithromycin  500 mg Oral Daily   B-complex with vitamin C  1 tablet Oral Daily   calcium carbonate   1,250 mg Oral Q breakfast   cholecalciferol  1,000 Units Oral Daily   donepezil  10 mg Oral QHS   DULoxetine  20 mg Oral Daily   enoxaparin (LOVENOX) injection  40 mg Subcutaneous Q24H   famotidine  20 mg Oral BID   ferrous sulfate  325 mg Oral TID WC   gabapentin  100 mg Oral TID   latanoprost  1 drop Both Eyes QHS   levothyroxine  50 mcg Oral Q0600   loratadine  10 mg Oral Daily   omega-3 acid ethyl esters  1 g Oral Daily   primidone  50 mg Oral BID   propranolol  10 mg Oral BID   vitamin E  400 Units Oral Daily   zinc sulfate  220 mg Oral Daily   Continuous Infusions:   LOS: 1 day   Time spent: 40 minutes. More than 50% of the time was spent in counseling/coordination of care  Lorella Nimrod, MD [facility]  If 7PM-7AM, please contact night-coverage [email or web address]  02/04/2021, 3:47 PM   This record has been created using Systems analyst. Errors have been sought and corrected,but may not always be located. Such creation errors do not reflect on the standard of care.

## 2021-02-04 NOTE — ED Notes (Signed)
Pt repositioned in bed and given meal tray 

## 2021-02-04 NOTE — Evaluation (Addendum)
Physical Therapy Evaluation Patient Details Name: Allen Chang MRN: [id number] DOB: 1932/11/15 Today's Date: 02/04/2021  History of Present Illness  Allen Chang is an 68yoM who comes to [facility] on 02/02/21 c fever, cough, (+) COVID test at home on 01/23/21 with everpresent cough. CTA negative for PE. PMH: CLL, CKD, HTN, PrCA, hypoTSH, AF, dementia, partial knee arthroplasty 09/25/20, in ED 01/24/21 after fall at home c shoulder pain and nasal fracture. Tests are positive for C-Diff, Covid19.  Clinical Impression  Pt admitted with above diagnosis. Pt currently with functional limitations due to the deficits listed below (see "PT Problem List"). Upon entry, pt in bed, awake and interactive. Per NSG notes pt oriented to self this morning, but following commands. At this time, pt appears distressed, he reports not doing well, c/o Left sided CP, the worst he's ever felt in his life, unable to say whether he's had symptoms similar to this before or if he has told RN about his pain. BP assessed, as last reading appeared concerningly high, however when Pryor Curia is able to get pt to relax his arm for BP, values are much more consistent with other readings earlier in day (580D systolic). Author reattaches black lead electrode. Pt requires ModA to scoot up in bed, noted we spots on sheet from liquid bowel leakage bilat, appears old. Pt has limited ROM on RLE due to chronic stiffness and pain  (s/p partial arthroplasty in August), left leg moving better. Pt too weak to clear hips with bridging. Pt made comfortable, plan to rest with lights off, will make RN aware of CP complaint. PLOF taken from prior PT admission. Pt unable to answer questions about recent mobility. Author unable to reach wife via telephone. Appears pt has been to ED just 11 days prior to this date for fall and injury. Patient's performance this date reveals decreased ability, independence, and tolerance in performing all basic mobility required for  performance of activities of daily living. Pt requires additional DME, close physical assistance, and cues for safe participate in mobility. Pt will benefit from skilled PT intervention to increase independence and safety with basic mobility in preparation for discharge to the venue listed below.          Recommendations for follow up therapy are one component of a multi-disciplinary discharge planning process, led by the attending physician.  Recommendations may be updated based on patient status, additional functional criteria and insurance authorization.  Follow Up Recommendations Skilled nursing-short term rehab (<3 hours/day)    Assistance Recommended at Discharge Intermittent Supervision/Assistance  Functional Status Assessment Patient has had a recent decline in their functional status and demonstrates the ability to make significant improvements in function in a reasonable and predictable amount of time.  Equipment Recommendations  None recommended by PT    Recommendations for Other Services       Precautions / Restrictions Precautions Precautions: Fall Restrictions Weight Bearing Restrictions: No      Mobility  Bed Mobility Overal bed mobility: Needs Assistance             General bed mobility comments: modA for scooting up in bed flat; unable to clear buttocks during bridging.    Transfers Overall transfer level:  (deferred; pt crying 2/2 chest pain)                      Ambulation/Gait                  Stairs  Wheelchair Mobility    Modified Rankin (Stroke Patients Only)       Balance                                             Pertinent Vitals/Pain Pain Assessment: Faces Faces Pain Scale: Hurts worst (pt crying when asked abotu pain, reports worst pain ever in left upper chest.)    Home Living Family/patient expects to be discharged to:: Private residence Living Arrangements: Spouse/significant  other Available Help at Discharge: Family;Available 24 hours/day Type of Home: House Home Access: Stairs to enter Entrance Stairs-Rails: Right Entrance Stairs-Number of Steps: 3-4 steps   Home Layout: Two level Home Equipment: Conservation officer, nature (2 wheels);Cane - single point;Grab bars - toilet;Grab bars - tub/shower;Shower seat Additional Comments: Per pt bedroom, bathroom all accessible on first floor, does not use second floor.    Prior Function               Mobility Comments: need updates from family, pt unable to provide       Hand Dominance        Extremity/Trunk Assessment        Lower Extremity Assessment Lower Extremity Assessment: Generalized weakness       Communication      Cognition Arousal/Alertness: Awake/alert Behavior During Therapy: WFL for tasks assessed/performed Overall Cognitive Status: Within Functional Limits for tasks assessed                                          General Comments      Exercises     Assessment/Plan    PT Assessment Patient needs continued PT services  PT Problem List Decreased strength;Decreased range of motion;Decreased activity tolerance;Decreased balance;Decreased mobility;Decreased coordination;Decreased cognition;Decreased knowledge of use of DME;Decreased safety awareness;Decreased knowledge of precautions       PT Treatment Interventions DME instruction;Balance training;Gait training;Stair training;Functional mobility training;Therapeutic activities;Therapeutic exercise;Patient/family education;Cognitive remediation;Neuromuscular re-education    PT Goals (Current goals can be found in the Care Plan section)  Acute Rehab PT Goals PT Goal Formulation: Patient unable to participate in goal setting    Frequency Min 2X/week   Barriers to discharge        Co-evaluation               AM-PAC PT "6 Clicks" Mobility  Outcome Measure Help needed turning from your back to your side  while in a flat bed without using bedrails?: A Lot Help needed moving from lying on your back to sitting on the side of a flat bed without using bedrails?: A Lot Help needed moving to and from a bed to a chair (including a wheelchair)?: A Lot Help needed standing up from a chair using your arms (e.g., wheelchair or bedside chair)?: A Lot Help needed to walk in hospital room?: A Lot Help needed climbing 3-5 steps with a railing? : A Lot 6 Click Score: 12    End of Session   Activity Tolerance: Patient limited by pain;Treatment limited secondary to agitation (anxious, labile, c/o pain in chest) Patient left: in bed;with call bell/phone within reach;with bed alarm set Nurse Communication: Mobility status PT Visit Diagnosis: Other abnormalities of gait and mobility (R26.89);Muscle weakness (generalized) (M62.81)    Time: 4401-0272 PT Time Calculation (min) (ACUTE  ONLY): 16 min   Charges:   PT Evaluation $PT Eval High Complexity: 1 High         2:43 PM, 02/04/21 Etta Grandchild, PT, DPT Physical Therapist - [facility]  [phone] ([place])    Benld C 02/04/2021, 2:35 PM

## 2021-02-04 NOTE — ED Notes (Addendum)
Tmax tonight of 102.2. Treated with Tylenol. Temp came down to 100.1. Recheck of temp this morning of 101.1. Dr. Reesa Chew notified. No other significant events tonight. No complaints of pain. Pt did yell out for help when needing to void. Noted patient to be slid down in bed. Bed alarm required for patient safety.   Neuro: Orient to self only. Follows commands. Easily redirectable. PERRL. Moves all extremities.   Cardiac: SB to SR. HR 50-70. SBP > 90 without intervention. No swelling noted.   Resp: RA. Dry non productive cough. Diminished lung sounds throughout.   GI/GU: Incontinent of urine most of the time. One episode of measured urine of 275 ml. No stools through the night.   PIV x 1. Saline locked.

## 2021-02-04 NOTE — ED Notes (Signed)
Lab called due to urine not being processed

## 2021-02-04 NOTE — ED Notes (Signed)
Lab called to collect blood cultures.

## 2021-02-04 NOTE — ED Notes (Signed)
Patient denies pain and is resting comfortably.  

## 2021-02-04 NOTE — ED Notes (Signed)
Pt given meal tray. Wife at bedside to assist in feeding

## 2021-02-05 LAB — URINE CULTURE
Culture: NO GROWTH
Special Requests: NORMAL

## 2021-02-05 NOTE — TOC Initial Note (Signed)
Transition of Care [facility]) - Initial/Assessment Note    Patient Details  Name: Allen Chang MRN: [id number] Date of Birth: 1932/06/30  Transition of Care [facility]) CM/SW Contact:    Candie Chroman, LCSW Phone Number: 02/05/2021, 2:13 PM  Clinical Narrative:   Patient not fully oriented. Wife at bedside. CSW introduced role and explained that therapy recommendations would be discussed. Wife is not interested in SNF placement but is agreeable to home health services. He had worked with an agency in the past but wife could not remember name. According to patient ping, patient has worked with [facility] twice in the past year. Made referral for PT, OT, RN, aide, SW. He has a walker, elevated toilet seat, and shower chair at home. No further concerns. CSW encouraged patient's wife to contact CSW as needed. CSW will continue to follow patient and his wife for support and facilitate return home when stable.               Expected Discharge Plan: [place] Barriers to Discharge: Continued Medical Work up   Patient Goals and CMS Choice        Expected Discharge Plan and Services Expected Discharge Plan: [place] Choice: [place] arrangements for the past 2 months: Single Family Home                                      Prior Living Arrangements/Services Living arrangements for the past 2 months: Single Family Home Lives with:: Spouse Patient language and need for interpreter reviewed:: Yes Do you feel safe going back to the place where you live?: Yes      Need for Family Participation in Patient Care: Yes (Comment) Care giver support system in place?: Yes (comment) Current home services: DME Criminal Activity/Legal Involvement Pertinent to Current Situation/Hospitalization: No - Comment as needed  Activities of Daily Living Home Assistive Devices/Equipment: Other (Comment) ADL Screening (condition at time of  admission) Patient's cognitive ability adequate to safely complete daily activities?: No Is the patient deaf or have difficulty hearing?: Yes Does the patient have difficulty seeing, even when wearing glasses/contacts?: No Does the patient have difficulty concentrating, remembering, or making decisions?: Yes Patient able to express need for assistance with ADLs?: Yes Does the patient have difficulty dressing or bathing?: Yes Independently performs ADLs?: No Does the patient have difficulty walking or climbing stairs?: Yes Weakness of Legs: Both Weakness of Arms/Hands: None  Permission Sought/Granted Permission sought to share information with : Facility Sport and exercise psychologist    Share Information with NAME: Allen Chang  Permission granted to share info w AGENCY: [place] granted to share info w Relationship: Spouse  Permission granted to share info w Contact Information: [phone]  Emotional Assessment Appearance:: Appears stated age Attitude/Demeanor/Rapport: Unable to Assess Affect (typically observed): Pleasant Orientation: : Oriented to Self, Oriented to Place Alcohol / Substance Use: Not Applicable Psych Involvement: No (comment)  Admission diagnosis:  Hyponatremia [E87.1] CAP (community acquired pneumonia) [J18.9] Community acquired pneumonia, unspecified laterality [J18.9] Patient Active Problem List   Diagnosis Date Noted   CAP (community acquired pneumonia) 02/03/2021   Status post right partial knee replacement 09/25/2020   Lower GI bleed 05/03/2020   CKD (chronic kidney disease), stage III ([place]) 05/03/2020   Hiatal hernia 12/16/2019   Postural dizziness with presyncope 12/16/2019  New onset Atrial fibrillation with slow ventricular response ([place]) 12/16/2019   Chest pain 12/16/2019   Encounter for antineoplastic chemotherapy 10/22/2018   Goals of care, counseling/discussion 09/28/2018   Thrombocytopenia ([place]) 10/27/2016   Iron  deficiency 10/27/2016   CLL (chronic lymphocytic leukemia) ([place]) 10/27/2016   Macrocytic anemia 10/27/2016   Acquired hypothyroidism 10/01/2016   Chronic kidney disease (CKD) stage G3a/A1, moderately decreased glomerular filtration rate (GFR) between 45-59 mL/min/1.73 square meter and albuminuria creatinine ratio less than 30 mg/g (HCC) 10/01/2016   Status post left partial knee replacement 12/12/2014   Bradycardia 10/10/2014   Severe sinus bradycardia 10/10/2014   Benign essential hypertension 05/02/2013   Diabetes mellitus ([place]) 05/02/2013   Hyperlipidemia, unspecified 05/02/2013   PCP:  Tracie Harrier, MD Pharmacy:   [facility] ([address] [place], [address] [address] Phone: [phone] Fax: [place] #[id number] [address] [place] [address] Phone: [phone] Fax: [phone]     Social Determinants of Health ([place]) Interventions    Readmission Risk Interventions No flowsheet data found.

## 2021-02-05 NOTE — Evaluation (Signed)
Occupational Therapy Evaluation Patient Details Name: Allen Chang MRN: [id number] DOB: [birth year] Today's Date: 02/05/2021   History of Present Illness Allen Chang is an 20yoM who comes to [facility] on 02/02/21 c fever, cough, (+) COVID test at home on 01/23/21 with everpresent cough. CTA negative for PE. PMH: CLL, CKD, HTN, PrCA, hypoTSH, AF, dementia, partial knee arthroplasty 09/25/20, in ED 01/24/21 after fall at home c shoulder pain and nasal fracture. Tests are positive for C-Diff, Covid19.   Clinical Impression   Allen Chang presents today with generalized weakness, limited endurance, and confusion. He is, however, considerably improved compared to yesterday's rehab (PT) session, during which he was too weak to engage in bed mobility and was experiencing 10/10 pain. During today's evaluation, pt reports no pain, performs bed mobility with SUPV-Min A, is able to come into standing with Min A and remain in standing with fair balance for almost 10 minutes. Pt is oriented to self and place today, unable to identify month and year. He is able to provide an accurate description of his home environment (2-story house, 3-4 STE, able to live on main floor, states that he no longer drives), but provides fantastical description of his daily activities (e.g., when asks how he spends his days, he says that he keeps very busy because he has 107 black angus cattle at home, that he has needs to get home to put up hay). Wife reports that, PTA, pt is generally able to complete dressing with Mod I and that she assists him with toileting and bathing. She has help from children and grandchildren as needed to provide care to pt. Recommend ongoing OT while pt is hospitalized. Given pt's weakness and recent decline in ability to safely perform fxl mobility, pt would be a good candidate for STR. Pt's wife, however, reports that she is unwilling to consider SNF placement at this time. If pt continues to make good progress in  next few days, then recommend DC to home with Allen Chang. Based on pt's presentation today, SNF would be appropriate.    Recommendations for follow up therapy are one component of a multi-disciplinary discharge planning process, led by the attending physician.  Recommendations may be updated based on patient status, additional functional criteria and insurance authorization.   Follow Up Recommendations  Skilled nursing-short term rehab (<3 hours/day)    Assistance Recommended at Discharge Frequent or constant Supervision/Assistance  Functional Status Assessment  Patient has had a recent decline in their functional status and demonstrates the ability to make significant improvements in function in a reasonable and predictable amount of time.  Equipment Recommendations  None recommended by OT    Recommendations for Other Services       Precautions / Restrictions Precautions Precautions: Fall Restrictions Weight Bearing Restrictions: No      Mobility Bed Mobility Overal bed mobility: Needs Assistance Bed Mobility: Supine to Sit;Sit to Supine     Supine to sit: Min assist Sit to supine: Supervision   General bed mobility comments: extended time and repeated cueing for supine<sit, min A for foot placement on floor. Pt able to complete sit<supine w/o physical assist or cueing    Transfers Overall transfer level: Needs assistance Equipment used: Rolling walker (2 wheels) Transfers: Sit to/from Stand Sit to Stand: Mod assist           General transfer comment: Mod A for powering up into standing      Balance Overall balance assessment: Needs assistance Sitting-balance support: Bilateral upper extremity supported;Feet  supported Sitting balance-Leahy Scale: Good     Standing balance support: Bilateral upper extremity supported Standing balance-Leahy Scale: Fair Standing balance comment: able to maintain standing balance for ~ 10 minutes, with close SUPV for safety and cueing  for upright posture                           ADL either performed or assessed with clinical judgement   ADL Overall ADL's : Needs assistance/impaired Eating/Feeding: Modified independent Eating/Feeding Details (indicate cue type and reason): B/l hand tremors makes self-feeding messy Grooming: Wash/dry hands;Wash/dry face;Sitting Grooming Details (indicate cue type and reason): requires cueing for task initiation, continuation                               General ADL Comments: anticipate Max A for OOB fxl mobility     Vision         Perception     Praxis      Pertinent Vitals/Pain Pain Assessment: No/denies pain     Hand Dominance     Extremity/Trunk Assessment Upper Extremity Assessment Upper Extremity Assessment: Generalized weakness   Lower Extremity Assessment Lower Extremity Assessment: Generalized weakness   Cervical / Trunk Assessment Cervical / Trunk Assessment: Kyphotic   Communication Communication Communication: No difficulties   Cognition Arousal/Alertness: Awake/alert Behavior During Therapy: WFL for tasks assessed/performed Overall Cognitive Status: History of cognitive impairments - at baseline                                 General Comments: Pt vacillate today between moments of clarity and confusion. Provides year as [birth year]. Able to recognize that he is at the hospital.     General Comments       Exercises Other Exercises Other Exercises: Bed mobility, transfers, standing and sitting balance tolerance, grooming, self-feeding. Educ re: role of OT, POC, DC recs.   Shoulder Instructions      Home Living Family/patient expects to be discharged to:: Private residence Living Arrangements: Spouse/significant other Available Help at Discharge: Family;Available 24 hours/day Type of Home: House Home Access: Stairs to enter [employer] of Steps: 3-4 steps Entrance Stairs-Rails: Right Home Layout:  Two level;Able to live on main level with bedroom/bathroom         Bathroom Toilet: Handicapped height     Home Equipment: Conservation officer, nature (2 wheels);Cane - single point;Grab bars - toilet;Grab bars - tub/shower;Shower seat   Additional Comments: Per pt bedroom, bathroom all accessible on first floor, does not use second floor.      Prior Functioning/Environment Prior Level of Function : Needs assist             Mobility Comments: pt uses RW for ambulation. Reports 2 falls in previous 12 months ADLs Comments: Until recent fall (01/24/21), pt has been able to complete bed mobility and dressing INDly. Wife provides SUPV for bathing, toileting. Pt does not drive. Wife handles all shopping, cooking, cleaning        OT Problem List: Decreased strength;Impaired balance (sitting and/or standing);Decreased cognition;Decreased activity tolerance      OT Treatment/Interventions: Self-care/ADL training;DME and/or AE instruction;Therapeutic activities;Balance training;Therapeutic exercise;Energy conservation;Patient/family education;Cognitive remediation/compensation    OT Goals(Current goals can be found in the care plan section) Acute Rehab OT Goals Patient Stated Goal: to go home OT Goal Formulation: With patient/family Time For  Goal Achievement: 02/19/21 Potential to Achieve Goals: Good ADL Goals Pt Will Perform Grooming: standing;sitting;with set-up;with min guard assist (in sitting or standing as able) Pt Will Perform Upper Body Dressing: with set-up;with supervision;standing;sitting (in sitting or standing as able) Pt Will Perform Lower Body Dressing: with set-up;sitting/lateral leans;sit to/from stand;with min assist (in sitting or standing as able) Pt Will Transfer to Toilet: with min assist (using LRAD)  OT Frequency: Min 2X/week   Barriers to D/C:            Co-evaluation              AM-PAC OT "6 Clicks" Daily Activity     Outcome Measure Help from another  person eating meals?: None Help from another person taking care of personal grooming?: A Little Help from another person toileting, which includes using toliet, bedpan, or urinal?: A Lot Help from another person bathing (including washing, rinsing, drying)?: A Lot Help from another person to put on and taking off regular upper body clothing?: A Little Help from another person to put on and taking off regular lower body clothing?: A Lot 6 Click Score: 16   End of Session Equipment Utilized During Treatment: Rolling walker (2 wheels)  Activity Tolerance: Patient tolerated treatment well Patient left: in bed;with family/visitor present;with call bell/phone within reach;with bed alarm set  OT Visit Diagnosis: Unsteadiness on feet (R26.81);Muscle weakness (generalized) (M62.81)                Time: 9826-4158 OT Time Calculation (min): 39 min Charges:  OT General Charges $OT Visit: 1 Visit OT Evaluation $OT Eval Moderate Complexity: 1 Mod OT Treatments $Self Care/Home Management : 38-52 mins Josiah Lobo, PhD, MS, OTR/L 02/05/21, 12:58 PM

## 2021-02-05 NOTE — Progress Notes (Signed)
PROGRESS NOTE    Allen Chang  QZE:[id number] DOB: 18-Mar-1932 DOA: 02/03/2021 PCP: Allen Harrier, MD   Brief Narrative: Taken from prior notes. Allen Chang is a 87 y.o. Caucasian male with medical history significant for stage III chronic kidney disease, GERD, hypertension, hypothyroidism and CLL apparently in remission as well as prostate cancer, who presented to the ER with acute onset of dyspnea with associated dry cough and wheezing for the last few days.  Patient was tested positive for COVID-19 on 01/23/2021 and was given a course of oral antiviral therapy. Patient was febrile on arrival.  Chest x-ray with left basal atelectasis and large hiatal hernia.  CTA was negative for PE but there was some concern of focal consolidation within the basilar lingula and left lower lobe with some associated left parapneumonic effusion.  He was initially started on ceftriaxone and Zithromax and then later transitioned to Augmentin and Zithromax for a possible discharge. PT is recommending SNF.  CT chest with also some concerning pathologic mediastinal and left hilar lymphadenopathy, most likely reactive but they are recommending follow-up in 3 to 6 months with CT or PET scan.  Patient also developed watery diarrhea , which has been resolved.  C. difficile antigen was positive only, PCR and toxin gene negative.  PT is recommending SNF.  Wife is not completely sure, might take him back home with home health services, she wants him to be little more mobile so she can take care of him.  Will be a difficult disposition based on his advanced underlying dementia.  Subjective: Patient was seen and examined today.  Remained afebrile.  No nausea or vomiting.  Wife at bedside.  She really wants him to recover little more before deciding about home health versus SNF.  She wants him to return home but at the same time might not be able to take care of him if remained this week.  Assessment & Plan:    Principal Problem:   CAP (community acquired pneumonia)  Left lingular community-acquired pneumonia.  Remained on room air.  Remained afebrile over the past 24 hours. -Blood and urine culture-remain negative -Supportive care -Continue with Augmentin and Zithromax.  Cognitive dysfunction/dementia. -Continue home dose of Aricept -PT is recommending SNF  Diarrhea.  Resolved.  Only C. difficile antigen positive, toxin and PCR negative.  Hypothyroidism. -Continue Synthroid  Benign essential tremors. -Continue home dose of Mysoline.  Objective: Vitals:   02/04/21 2033 02/05/21 0539 02/05/21 0739 02/05/21 1135  BP: 139/79 137/65 (!) 144/73 122/62  Pulse: 85 (!) 58 72 62  Resp: 17 18 16 16   Temp: 97.8 F (36.6 C) 97.7 F (36.5 C) 97.7 F (36.5 C) 97.8 F (36.6 C)  TempSrc:  Axillary  Oral  SpO2: 94% 94% 93% 95%  Weight:      Height:        Intake/Output Summary (Last 24 hours) at 02/05/2021 1537 Last data filed at 02/05/2021 0957 Gross per 24 hour  Intake 360 ml  Output 1500 ml  Net -1140 ml    Filed Weights   02/02/21 1738  Weight: 81.7 kg    Examination:  General.  Well-developed gentleman, in no acute distress. Pulmonary.  Lungs clear bilaterally, normal respiratory effort. CV.  Regular rate and rhythm, no JVD, rub or murmur. Abdomen.  Soft, nontender, nondistended, BS positive. CNS.  Alert and oriented to self only.  No focal neurologic deficit. Extremities.  No edema, no cyanosis, pulses intact and symmetrical. Psychiatry.  Judgment and insight  appears impaired.  DVT prophylaxis: Lovenox Code Status: Full Family Communication: Discussed with wife at bedside Disposition Plan:  Status is: Inpatient  Remains inpatient appropriate because: Severity of illness   Level of care: Telemetry Medical  All the records are reviewed and case discussed with Care Management/Social Worker. Management plans discussed with the patient, nursing and they are in  agreement.  Consultants:  None  Procedures:  Antimicrobials:  Augmentin Zithromax  Data Reviewed: I have personally reviewed following labs and imaging studies  CBC: Recent Labs  Lab 02/02/21 1747 02/03/21 1701  WBC 10.8* 8.2  NEUTROABS 2.4  --   HGB 12.5* 11.5*  HCT 37.5* 34.2*  MCV 103.9* 103.6*  PLT 121* 111*    Basic Metabolic Panel: Recent Labs  Lab 02/02/21 1747 02/03/21 1701  NA 129* 134*  K 4.8 4.1  CL 98 102  CO2 23 26  GLUCOSE 105* 106*  BUN 24* 16  CREATININE 1.31* 1.07  CALCIUM 9.5 8.6*    GFR: Estimated Creatinine Clearance: 49.6 mL/min (by C-G formula based on SCr of 1.07 mg/dL). Liver Function Tests: Recent Labs  Lab 02/02/21 1747  AST 22  ALT 18  ALKPHOS 86  BILITOT 0.7  PROT 7.3  ALBUMIN 3.6    Recent Labs  Lab 02/02/21 1747  LIPASE 32    No results for input(s): AMMONIA in the last 168 hours. Coagulation Profile: No results for input(s): INR, PROTIME in the last 168 hours. Cardiac Enzymes: No results for input(s): CKTOTAL, CKMB, CKMBINDEX, TROPONINI in the last 168 hours. BNP (last 3 results) No results for input(s): PROBNP in the last 8760 hours. HbA1C: No results for input(s): HGBA1C in the last 72 hours. CBG: No results for input(s): GLUCAP in the last 168 hours. Lipid Profile: No results for input(s): CHOL, HDL, LDLCALC, TRIG, CHOLHDL, LDLDIRECT in the last 72 hours. Thyroid Function Tests: No results for input(s): TSH, T4TOTAL, FREET4, T3FREE, THYROIDAB in the last 72 hours. Anemia Panel: No results for input(s): VITAMINB12, FOLATE, FERRITIN, TIBC, IRON, RETICCTPCT in the last 72 hours. Sepsis Labs: Recent Labs  Lab 02/03/21 1701  PROCALCITON <0.10     Recent Results (from the past 240 hour(s))  Resp Panel by RT-PCR (Flu A&B, Covid) Nasopharyngeal Swab     Status: Abnormal   Collection Time: 02/03/21  5:29 AM   Specimen: Nasopharyngeal Swab; Nasopharyngeal(NP) swabs in vial transport medium  Result Value  Ref Range Status   SARS Coronavirus 2 by RT PCR POSITIVE (A) NEGATIVE Final    Comment: (NOTE) SARS-CoV-2 target nucleic acids are DETECTED.  The SARS-CoV-2 RNA is generally detectable in upper respiratory specimens during the acute phase of infection. Positive results are indicative of the presence of the identified virus, but do not rule out bacterial infection or co-infection with other pathogens not detected by the test. Clinical correlation with patient history and other diagnostic information is necessary to determine patient infection status. The expected result is Negative.  Fact Sheet for Patients: EntrepreneurPulse.com.au  Fact Sheet for Healthcare Providers: IncredibleEmployment.be  This test is not yet approved or cleared by the [place] FDA and  has been authorized for detection and/or diagnosis of SARS-CoV-2 by FDA under an Emergency Use Authorization (EUA).  This EUA will remain in effect (meaning this test can be used) for the duration of  the COVID-19 declaration under Section 564(b)(1) of the A ct, 21 U.S.C. section 360bbb-3(b)(1), unless the authorization is terminated or revoked sooner.     Influenza A by PCR  NEGATIVE NEGATIVE Final   Influenza B by PCR NEGATIVE NEGATIVE Final    Comment: (NOTE) The Xpert Xpress SARS-CoV-2/FLU/RSV plus assay is intended as an aid in the diagnosis of influenza from Nasopharyngeal swab specimens and should not be used as a sole basis for treatment. Nasal washings and aspirates are unacceptable for Xpert Xpress SARS-CoV-2/FLU/RSV testing.  Fact Sheet for Patients: EntrepreneurPulse.com.au  Fact Sheet for Healthcare Providers: IncredibleEmployment.be  This test is not yet approved or cleared by the [place] FDA and has been authorized for detection and/or diagnosis of SARS-CoV-2 by FDA under an Emergency Use Authorization (EUA). This EUA will  remain in effect (meaning this test can be used) for the duration of the COVID-19 declaration under Section 564(b)(1) of the Act, 21 U.S.C. section 360bbb-3(b)(1), unless the authorization is terminated or revoked.  Performed at [facility], [place], [place] [address]   C Difficile Quick Screen w PCR reflex     Status: Abnormal   Collection Time: 02/03/21  1:55 PM   Specimen: STOOL  Result Value Ref Range Status   C Diff antigen POSITIVE (A) NEGATIVE Final   C Diff toxin NEGATIVE NEGATIVE Final   C Diff interpretation Results are indeterminate. See PCR results.  Final    Comment: Performed at [facility], [address] [place], [place] [address]  C. Diff by PCR, Reflexed     Status: None   Collection Time: 02/03/21  1:55 PM  Result Value Ref Range Status   Toxigenic C. Difficile by PCR NEGATIVE NEGATIVE Final    Comment: Patient is colonized with non toxigenic C. difficile. May not need treatment unless significant symptoms are present. Performed at [facility], [address] [place], [place] [address]   CULTURE, BLOOD (ROUTINE X 2) w Reflex to ID Panel     Status: None (Preliminary result)   Collection Time: 02/04/21 12:42 PM   Specimen: BLOOD  Result Value Ref Range Status   Specimen Description BLOOD LEFT ANTECUBITAL  Final   Special Requests   Final    BOTTLES DRAWN AEROBIC AND ANAEROBIC Blood Culture adequate volume   Culture   Final    NO GROWTH < 24 HOURS Performed at [facility], [address], [place], [place] [address]    Report Status PENDING  Incomplete  CULTURE, BLOOD (ROUTINE X 2) w Reflex to ID Panel     Status: None (Preliminary result)   Collection Time: 02/04/21 12:55 PM   Specimen: BLOOD  Result Value Ref Range Status   Specimen Description BLOOD BLOOD LEFT HAND  Final   Special Requests   Final    BOTTLES DRAWN AEROBIC ONLY Blood Culture adequate volume   Culture   Final    NO GROWTH < 24  HOURS Performed at [facility], [address] [place], [place] [address]    Report Status PENDING  Incomplete  Urine Culture     Status: None   Collection Time: 02/04/21  2:27 PM   Specimen: Urine, Random  Result Value Ref Range Status   Specimen Description   Final    URINE, RANDOM Performed at [facility], [address], [place], [place] [address]    Special Requests   Final    Normal Performed at [facility], [address] [place], [place] [address]    Culture   Final    NO GROWTH Performed at [facility], [place] [address], [place], [place]  [address]    Report Status 02/05/2021 FINAL  Final      Radiology Studies: No results found.  Scheduled Meds:  amoxicillin-clavulanate  1 tablet Oral Q12H   ascorbic acid  1,000 mg Oral Daily   B-complex with vitamin C  1 tablet Oral Daily   calcium carbonate  1,250 mg Oral Q breakfast   cholecalciferol  1,000 Units Oral Daily   donepezil  10 mg Oral QHS   DULoxetine  20 mg Oral Daily   enoxaparin (LOVENOX) injection  40 mg Subcutaneous Q24H   famotidine  20 mg Oral BID   ferrous sulfate  325 mg Oral TID WC   gabapentin  100 mg Oral TID   latanoprost  1 drop Both Eyes QHS   levothyroxine  50 mcg Oral Q0600   loratadine  10 mg Oral Daily   omega-3 acid ethyl esters  1 g Oral Daily   primidone  50 mg Oral BID   propranolol  10 mg Oral BID   vitamin E  400 Units Oral Daily   zinc sulfate  220 mg Oral Daily   Continuous Infusions:   LOS: 2 days   Time spent: 38 minutes. More than 50% of the time was spent in counseling/coordination of care  Lorella Nimrod, MD [facility]  If 7PM-7AM, please contact night-coverage [email or web address]  02/05/2021, 3:37 PM   This record has been created using Systems analyst. Errors have been sought and corrected,but may not always be located. Such creation errors do not reflect on the standard of care.

## 2021-02-06 DIAGNOSIS — E871 Hypo-osmolality and hyponatremia: Secondary | ICD-10-CM

## 2021-02-06 DIAGNOSIS — E860 Dehydration: Secondary | ICD-10-CM

## 2021-02-06 DIAGNOSIS — F0390 Unspecified dementia without behavioral disturbance: Secondary | ICD-10-CM

## 2021-02-06 LAB — CBC
HCT: 34.6 % — ABNORMAL LOW (ref 39.0–52.0)
Hemoglobin: 11.9 g/dL — ABNORMAL LOW (ref 13.0–17.0)
MCH: 34.3 pg — ABNORMAL HIGH (ref 26.0–34.0)
MCHC: 34.4 g/dL (ref 30.0–36.0)
MCV: 99.7 fL (ref 80.0–100.0)
Platelets: 109 10*3/uL — ABNORMAL LOW (ref 150–400)
RBC: 3.47 MIL/uL — ABNORMAL LOW (ref 4.22–5.81)
RDW: 11.8 % (ref 11.5–15.5)
WBC: 10.9 10*3/uL — ABNORMAL HIGH (ref 4.0–10.5)
nRBC: 0 % (ref 0.0–0.2)

## 2021-02-06 MED ORDER — AMOXICILLIN-POT CLAVULANATE 875-125 MG PO TABS
1.0000 | ORAL_TABLET | Freq: Two times a day (BID) | ORAL | 0 refills | Status: AC
Start: 2021-02-06 — End: 2021-02-09

## 2021-02-06 MED ORDER — AZITHROMYCIN 250 MG PO TABS: ORAL_TABLET | ORAL | 0 refills | Status: DC

## 2021-02-06 MED ORDER — ALBUTEROL SULFATE HFA 108 (90 BASE) MCG/ACT IN AERS: 2.0000 | INHALATION_SPRAY | Freq: Four times a day (QID) | RESPIRATORY_TRACT | 0 refills | Status: DC | PRN

## 2021-02-06 NOTE — Progress Notes (Signed)
Physical Therapy Treatment Patient Details Name: Allen Chang MRN: [id number] DOB: 03/28/1932 Today's Date: 02/06/2021   History of Present Illness Allen Chang is an 49yoM who comes to [facility] on 02/02/21 c fever, cough, (+) COVID test at home on 01/23/21 with everpresent cough. CTA negative for PE. PMH: CLL, CKD, HTN, PrCA, hypoTSH, AF, dementia, partial knee arthroplasty 09/25/20, in ED 01/24/21 after fall at home c shoulder pain and nasal fracture.    PT Comments    Pt in recliner, wife left recently per patient. Lunch eaten, pt reports to feel "pretty good." Pt able to stand and AMB with RW without physical assist. Pt AMB household distances today, reports no significant impairment of his recent baseline level of function. Pt sill has some mild strength and balance deficits, but these appear to be long standing. Pt could be managed at home if family is able to provide assist as needed. Pt left up in chair at end of session.    Recommendations for follow up therapy are one component of a multi-disciplinary discharge planning process, led by the attending physician.  Recommendations may be updated based on patient status, additional functional criteria and insurance authorization.  Follow Up Recommendations  Home health PT     Assistance Recommended at Discharge Intermittent Supervision/Assistance  Equipment Recommendations  None recommended by PT    Recommendations for Other Services       Precautions / Restrictions Precautions Precautions: Fall Restrictions Weight Bearing Restrictions: No     Mobility  Bed Mobility               General bed mobility comments: in recliner    Transfers Overall transfer level: Needs assistance Equipment used: Rolling walker (2 wheels) Transfers: Sit to/from Stand Sit to Stand: Supervision           General transfer comment: heavy effort from recliner, no cues needed    Ambulation/Gait Ambulation/Gait assistance:  Supervision;Min guard Gait Distance (Feet): 150 Feet Assistive device: Rolling walker (2 wheels) Gait Pattern/deviations: Step-to pattern       General Gait Details: very wide based turns, but not LOB, no signs of overt fatigue. Pt reports to be at his mobility baseline.   Stairs             Wheelchair Mobility    Modified Rankin (Stroke Patients Only)       Balance                                            Cognition Arousal/Alertness: Awake/alert Behavior During Therapy: WFL for tasks assessed/performed Overall Cognitive Status: Within Functional Limits for tasks assessed                                          Exercises      General Comments        Pertinent Vitals/Pain Pain Assessment: No/denies pain    Home Living                          Prior Function            PT Goals (current goals can now be found in the care plan section) Acute Rehab PT Goals PT Goal Formulation: With patient Progress towards  PT goals: Progressing toward goals    Frequency    Min 2X/week      PT Plan Discharge plan needs to be updated    Co-evaluation              AM-PAC PT "6 Clicks" Mobility   Outcome Measure  Help needed turning from your back to your side while in a flat bed without using bedrails?: A Little Help needed moving from lying on your back to sitting on the side of a flat bed without using bedrails?: A Little Help needed moving to and from a bed to a chair (including a wheelchair)?: A Little Help needed standing up from a chair using your arms (e.g., wheelchair or bedside chair)?: A Little Help needed to walk in hospital room?: A Little Help needed climbing 3-5 steps with a railing? : A Little 6 Click Score: 18    End of Session Equipment Utilized During Treatment: Gait belt Activity Tolerance: No increased pain;Patient tolerated treatment well Patient left: in chair;with family/visitor  present;with call bell/phone within reach;with nursing/sitter in room Nurse Communication: Mobility status PT Visit Diagnosis: Other abnormalities of gait and mobility (R26.89);Muscle weakness (generalized) (M62.81)     Time: 3435-6861 PT Time Calculation (min) (ACUTE ONLY): 15 min  Charges:  $Therapeutic Activity: 8-22 mins                    3:09 PM, 02/06/21 Etta Grandchild, PT, DPT Physical Therapist - [facility]  [phone] ([place])   [place] C 02/06/2021, 3:09 PM

## 2021-02-06 NOTE — Discharge Summary (Signed)
[facility] NAME: Allen Chang    MR#:  [id number]  DATE OF BIRTH:  [birth year]  DATE OF ADMISSION:  02/03/2021 ADMITTING PHYSICIAN: Christel Mormon, MD  DATE OF DISCHARGE: 02/06/2021  4:41 PM  PRIMARY CARE PHYSICIAN: Tracie Harrier, MD    ADMISSION DIAGNOSIS:  Hyponatremia [E87.1] CAP (community acquired pneumonia) [J18.9] Community acquired pneumonia, unspecified laterality [J18.9]  DISCHARGE DIAGNOSIS:  Principal Problem:   CAP (community acquired pneumonia)   SECONDARY DIAGNOSIS:   Past Medical History:  Diagnosis Date   Cancer ([place])    prostate    CKD (chronic kidney disease) stage 3, GFR 30-59 ml/min ([place]) 0/26/3785   Complication of anesthesia    bradycardia, in ICU for 24 hour after galbladder surgery.    Diverticulosis 2012   Dysrhythmia    Heart skips a beat   Elevated lipids    GERD (gastroesophageal reflux disease)    History of hiatal hernia    Hypertension    Hypothyroidism    Leukemia (HCC)    Prostate cancer (HCC)    Skin cancer    face, scalp, behind ear,back and hand   Tremors of nervous system     HOSPITAL COURSE:   Community-acquired pneumonia with recent COVID diagnosis.  Patient initially started on Rocephin and Zithromax and change over to Augmentin.  I will complete Zithromax and Augmentin course as outpatient. Hyponatremia.  This has improved with initial IV fluid hydration.  Sodium was 129 and improved up to 134. Dehydration with chronic disease stage II. Dementia on low-dose Aricept Hypothyroidism unspecified on levothyroxine Tremors on primidone Recent COVID infection on 01/23/2021 and received oral antiviral therapy at that time. Weakness.  Initial physical therapy recommended rehab but patient did better with physical therapy and the wife wanted to take him home so he was discharged home with home health. Radiologist recommended repeating CT scan of the chest in 3 to 6 months to  make sure left hilar adenopathy has resolved with treatment of pneumonia. Large hiatal hernia on CT scan Advised that he must eat and drink.  DISCHARGE CONDITIONS:   Satisfactory  CONSULTS OBTAINED:  None  DRUG ALLERGIES:   Allergies  Allergen Reactions   Oxycodone-Acetaminophen Other (See Comments)    Other reaction(s): Hallucinations     Voltaren [Diclofenac Sodium] Palpitations    DISCHARGE MEDICATIONS:   Allergies as of 02/06/2021       Reactions   Oxycodone-acetaminophen Other (See Comments)   Other reaction(s): Hallucinations   Voltaren [diclofenac Sodium] Palpitations        Medication List     STOP taking these medications    Paxlovid (150/100) 10 x 150 MG & 10 x 100MG  Tbpk Generic drug: nirmatrelvir & ritonavir       TAKE these medications    acetaminophen 500 MG tablet Commonly known as: TYLENOL Take 1,000 mg by mouth every 8 (eight) hours as needed for moderate pain.   albuterol 108 (90 Base) MCG/ACT inhaler Commonly known as: VENTOLIN HFA Inhale 2 puffs into the lungs every 6 (six) hours as needed for wheezing or shortness of breath.   amoxicillin-clavulanate 875-125 MG tablet Commonly known as: AUGMENTIN Take 1 tablet by mouth every 12 (twelve) hours for 7 doses.   ascorbic acid 1000 MG tablet Commonly known as: VITAMIN C Take 1,000 mg by mouth daily.   azithromycin 250 MG tablet Commonly known as: Zithromax 2 tabs po day one then one tab po daily afterwards  b complex vitamins capsule Take 1 capsule by mouth daily.   benzonatate 200 MG capsule Commonly known as: TESSALON Take 1 capsule by mouth 3 (three) times daily as needed.   Black Cherry Concentrate Liqd Take 15 mLs by mouth daily.   MISC NATURAL PRODUCTS PO Take 2 capsules by mouth daily. Beet juice capsules   calcium carbonate 1500 (600 Ca) MG Tabs tablet Commonly known as: OSCAL Take 1,500 mg by mouth daily with breakfast.   Cholecalciferol 25 MCG (1000 UT)  tablet Take 1,000 Units by mouth daily.   donepezil 10 MG tablet Commonly known as: ARICEPT Take 10 mg by mouth at bedtime.   DULoxetine 20 MG capsule Commonly known as: CYMBALTA Take 20 mg by mouth daily.   famotidine 40 MG tablet Commonly known as: PEPCID Take 40 mg by mouth 2 (two) times daily.   ferrous sulfate 325 (65 FE) MG EC tablet Take 325 mg by mouth 3 (three) times daily with meals.   Fish Oil 1000 MG Caps Take 1 capsule by mouth daily.   Flax Seed Oil 1000 MG Caps Take 1,000 mg by mouth daily.   gabapentin 100 MG capsule Commonly known as: NEURONTIN Take 100 mg by mouth 3 (three) times daily.   Garlic 5361 MG Caps Take 1 capsule by mouth every morning.   GINKOBA PO Take 2 tablets by mouth daily.   latanoprost 0.005 % ophthalmic solution Commonly known as: XALATAN Place 1 drop into both eyes at bedtime.   levothyroxine 50 MCG tablet Commonly known as: SYNTHROID Take 50 mcg by mouth daily before breakfast. Take on an empty stomach with a glass of water at least 30-60 minutes before breakfast.   loratadine 10 MG dissolvable tablet Commonly known as: CLARITIN REDITABS Take 10 mg by mouth daily. In am   OVER THE COUNTER MEDICATION Take 1 capsule by mouth daily. Neuriva   OVER THE COUNTER MEDICATION Take 1 capsule by mouth daily. Ageless Brain   OVER THE COUNTER MEDICATION Take 1,000 mg by mouth daily. BACOPA   primidone 50 MG tablet Commonly known as: MYSOLINE Take 50 mg by mouth 2 (two) times daily.   propranolol 10 MG tablet Commonly known as: INDERAL Take 10 mg by mouth 2 (two) times daily.   vitamin E 180 MG (400 UNITS) capsule Take 400 Units by mouth daily.   zinc gluconate 50 MG tablet Take 50 mg by mouth daily.         DISCHARGE INSTRUCTIONS:  Follow-up PCP 5 days  If you experience worsening of your admission symptoms, develop shortness of breath, life threatening emergency, suicidal or homicidal thoughts you must seek  medical attention immediately by calling 911 or calling your MD immediately  if symptoms less severe.  You Must read complete instructions/literature along with all the possible adverse reactions/side effects for all the Medicines you take and that have been prescribed to you. Take any new Medicines after you have completely understood and accept all the possible adverse reactions/side effects.   Please note  You were cared for by a hospitalist during your hospital stay. If you have any questions about your discharge medications or the care you received while you were in the hospital after you are discharged, you can call the unit and asked to speak with the hospitalist on call if the hospitalist that took care of you is not available. Once you are discharged, your primary care physician will handle any further medical issues. Please note that NO REFILLS for  any discharge medications will be authorized once you are discharged, as it is imperative that you return to your primary care physician (or establish a relationship with a primary care physician if you do not have one) for your aftercare needs so that they can reassess your need for medications and monitor your lab values.    Today   CHIEF COMPLAINT:   Chief Complaint  Patient presents with   Cough   Fever    HISTORY OF PRESENT ILLNESS:  Luisdavid Hamblin  is a 87 y.o. male came in with cough and fever and found to have pneumonia   VITAL SIGNS:  Blood pressure 108/69, pulse 72, temperature 98 F (36.7 C), temperature source Oral, resp. rate 16, height 5\' 9"  (1.753 m), weight 81.7 kg, SpO2 96 %.  I/O:   Intake/Output Summary (Last 24 hours) at 02/06/2021 1756 Last data filed at 02/06/2021 1418 Gross per 24 hour  Intake 720 ml  Output 400 ml  Net 320 ml    PHYSICAL EXAMINATION:  GENERAL:  87 y.o.-year-old patient lying in the bed with no acute distress.  EYES: Pupils equal, round, reactive to light and accommodation. No scleral  icterus.   HEENT: Head atraumatic, normocephalic. Oropharynx and nasopharynx clear.  NECK:  Supple, no jugular venous distention. No thyroid enlargement, no tenderness.  LUNGS: Decreased breath sounds bilateral bases.  Slight rhonchi bilateral bases.  No use of accessory muscles of respiration.  CARDIOVASCULAR: S1, S2 normal. No murmurs, rubs, or gallops.  ABDOMEN: Soft, non-tender, non-distended. Bowel sounds present. No organomegaly or mass.  EXTREMITIES: No pedal edema, cyanosis, or clubbing.  NEUROLOGIC: Cranial nerves II through XII are intact. Muscle strength 5/5 in all extremities.  Patient able to stand up without help for me. PSYCHIATRIC: The patient is alert and answers simple yes or no questions.  SKIN: No obvious rash, lesion, or ulcer.   DATA REVIEW:   CBC Recent Labs  Lab 02/06/21 0455  WBC 10.9*  HGB 11.9*  HCT 34.6*  PLT 109*    Chemistries  Recent Labs  Lab 02/02/21 1747 02/03/21 1701  NA 129* 134*  K 4.8 4.1  CL 98 102  CO2 23 26  GLUCOSE 105* 106*  BUN 24* 16  CREATININE 1.31* 1.07  CALCIUM 9.5 8.6*  AST 22  --   ALT 18  --   ALKPHOS 86  --   BILITOT 0.7  --      Microbiology Results  Results for orders placed or performed during the hospital encounter of 02/03/21  Resp Panel by RT-PCR (Flu A&B, Covid) Nasopharyngeal Swab     Status: Abnormal   Collection Time: 02/03/21  5:29 AM   Specimen: Nasopharyngeal Swab; Nasopharyngeal(NP) swabs in vial transport medium  Result Value Ref Range Status   SARS Coronavirus 2 by RT PCR POSITIVE (A) NEGATIVE Final    Comment: (NOTE) SARS-CoV-2 target nucleic acids are DETECTED.  The SARS-CoV-2 RNA is generally detectable in upper respiratory specimens during the acute phase of infection. Positive results are indicative of the presence of the identified virus, but do not rule out bacterial infection or co-infection with other pathogens not detected by the test. Clinical correlation with patient history  and other diagnostic information is necessary to determine patient infection status. The expected result is Negative.  Fact Sheet for Patients: EntrepreneurPulse.com.au  Fact Sheet for Healthcare Providers: IncredibleEmployment.be  This test is not yet approved or cleared by the [place] FDA and  has been authorized for detection and/or  diagnosis of SARS-CoV-2 by FDA under an Emergency Use Authorization (EUA).  This EUA will remain in effect (meaning this test can be used) for the duration of  the COVID-19 declaration under Section 564(b)(1) of the A ct, 21 U.S.C. section 360bbb-3(b)(1), unless the authorization is terminated or revoked sooner.     Influenza A by PCR NEGATIVE NEGATIVE Final   Influenza B by PCR NEGATIVE NEGATIVE Final    Comment: (NOTE) The Xpert Xpress SARS-CoV-2/FLU/RSV plus assay is intended as an aid in the diagnosis of influenza from Nasopharyngeal swab specimens and should not be used as a sole basis for treatment. Nasal washings and aspirates are unacceptable for Xpert Xpress SARS-CoV-2/FLU/RSV testing.  Fact Sheet for Patients: EntrepreneurPulse.com.au  Fact Sheet for Healthcare Providers: IncredibleEmployment.be  This test is not yet approved or cleared by the [place] FDA and has been authorized for detection and/or diagnosis of SARS-CoV-2 by FDA under an Emergency Use Authorization (EUA). This EUA will remain in effect (meaning this test can be used) for the duration of the COVID-19 declaration under Section 564(b)(1) of the Act, 21 U.S.C. section 360bbb-3(b)(1), unless the authorization is terminated or revoked.  Performed at [facility], [place], [place] [address]   C Difficile Quick Screen w PCR reflex     Status: Abnormal   Collection Time: 02/03/21  1:55 PM   Specimen: STOOL  Result Value Ref Range Status   C Diff antigen POSITIVE  (A) NEGATIVE Final   C Diff toxin NEGATIVE NEGATIVE Final   C Diff interpretation Results are indeterminate. See PCR results.  Final    Comment: Performed at [facility], [address] [place], [place] [address]  C. Diff by PCR, Reflexed     Status: None   Collection Time: 02/03/21  1:55 PM  Result Value Ref Range Status   Toxigenic C. Difficile by PCR NEGATIVE NEGATIVE Final    Comment: Patient is colonized with non toxigenic C. difficile. May not need treatment unless significant symptoms are present. Performed at [facility], [address] [place], [place] [address]   CULTURE, BLOOD (ROUTINE X 2) w Reflex to ID Panel     Status: None (Preliminary result)   Collection Time: 02/04/21 12:42 PM   Specimen: BLOOD  Result Value Ref Range Status   Specimen Description BLOOD LEFT ANTECUBITAL  Final   Special Requests   Final    BOTTLES DRAWN AEROBIC AND ANAEROBIC Blood Culture adequate volume   Culture   Final    NO GROWTH 2 DAYS Performed at [facility], [address], [place], [place] [address]    Report Status PENDING  Incomplete  CULTURE, BLOOD (ROUTINE X 2) w Reflex to ID Panel     Status: None (Preliminary result)   Collection Time: 02/04/21 12:55 PM   Specimen: BLOOD  Result Value Ref Range Status   Specimen Description BLOOD BLOOD LEFT HAND  Final   Special Requests   Final    BOTTLES DRAWN AEROBIC ONLY Blood Culture adequate volume   Culture   Final    NO GROWTH 2 DAYS Performed at [facility], [address] [place], [place] [address]    Report Status PENDING  Incomplete  Urine Culture     Status: None   Collection Time: 02/04/21  2:27 PM   Specimen: Urine, Random  Result Value Ref Range Status   Specimen Description   Final    URINE, RANDOM Performed at [facility], [address], [place], [place] [address]    Special Requests   Final    Normal Performed at [facility], [address],  [place], [place] [address]    Culture   Final    NO GROWTH Performed at [facility], [place] [address] [place], [place] [address]    Report Status 02/05/2021 FINAL  Final      Management plans discussed with the patient, family and they are in agreement.  CODE STATUS:  Code Status History     Date Active Date Inactive Code Status Order ID Comments User Context   09/25/2020 1119 09/26/2020 2044 Full Code [id number]  Corky Mull, MD Inpatient   05/03/2020 2051 05/05/2020 1645 Full Code [id number]  Toy Baker, MD ED   12/16/2019 0407 12/17/2019 2048 Full Code [id number]  Athena Masse, MD ED   02/07/2016 1357 02/07/2016 1836 Full Code [id number]  Corky Mull, MD Inpatient   12/12/2014 1108 12/14/2014 1626 Full Code [id number]  Corky Mull, MD Inpatient   10/10/2014 2303 10/11/2014 1743 Full Code [id number]  Gladstone Lighter, MD Inpatient      Advance Directive Documentation    Flowsheet Row Most Recent Value  Type of Advance Directive Healthcare Power of Attorney  Pre-existing out of facility DNR order (yellow form or pink MOST form) --  "MOST" Form in Place? --       TOTAL TIME TAKING CARE OF THIS PATIENT: 35 minutes.    Loletha Grayer M.D on 02/06/2021 at 5:56 PM   [facility]  CC: Primary care physician; Tracie Harrier, MD

## 2021-02-06 NOTE — Plan of Care (Signed)
  Problem: Clinical Measurements: Goal: Ability to maintain clinical measurements within normal limits will improve Outcome: Progressing   

## 2021-02-06 NOTE — Discharge Instructions (Signed)
Rec follow up ct chest in 6 weeks to ensure clearing

## 2021-02-06 NOTE — Progress Notes (Signed)
Patient is being discharged home with wife. Sjhe is present at bedside for discharge instructions. Patient and wife educated on self care per MD.

## 2021-02-06 NOTE — Care Management Important Message (Signed)
Important Message  Patient Details  Name: Allen Chang MRN: [id number] Date of Birth: 09/04/1932   Medicare Important Message Given:  Yes  I reviewed the Important Message from Medicare with the patient's wife, Allen Chang ([phone]) and she is in agreement with the discharge. I asked if she would like a copy and she replied no. I thanked her for her time.    Juliann Pulse A Delesia Martinek 02/06/2021, 3:43 PM

## 2021-02-06 NOTE — TOC Transition Note (Signed)
Transition of Care [facility]) - CM/SW Discharge Note   Patient Details  Name: Allen Chang MRN: [id number] Date of Birth: 04-Nov-1932  Transition of Care [facility]) CM/SW Contact:  Candie Chroman, LCSW Phone Number: 02/06/2021, 2:31 PM   Clinical Narrative:  Patient has orders to discharge home today. [facility] is able to accept for PT, OT, RN, aide, SW. Wife is aware and agreeable. No further concerns. CSW signing off.   Final next level of care: [place] Barriers to Discharge: Barriers Resolved   Patient Goals and CMS Choice     Choice offered to / list presented to : Spouse  Discharge Placement                Patient to be transferred to facility by: Wife will take him home. Name of family member notified: Romelle Reiley Patient and family notified of of transfer: 02/06/21  Discharge Plan and Services     Post Acute Care Choice: Home Health                    HH Arranged: RN, PT, OT, Nurse's Aide, Social Work [facility] Agency: [place] Date [facility] Agency Contacted: 02/06/21   Representative spoke with at [place]: [place] [employer]  Social Determinants of Health ([place]) Interventions     Readmission Risk Interventions No flowsheet data found.

## 2021-02-09 LAB — CULTURE, BLOOD (ROUTINE X 2)
Culture: NO GROWTH
Culture: NO GROWTH
Special Requests: ADEQUATE
Special Requests: ADEQUATE

## 2021-03-13 ENCOUNTER — Emergency Department: Payer: Medicare Other

## 2021-03-13 ENCOUNTER — Emergency Department
Admission: EM | Admit: 2021-03-13 | Discharge: 2021-03-13 | Disposition: A | Discharge status: Home / Self Care | Payer: Medicare Other | Attending: Emergency Medicine | Admitting: Emergency Medicine

## 2021-03-13 ENCOUNTER — Other Ambulatory Visit: Payer: Self-pay

## 2021-03-13 ENCOUNTER — Encounter: Payer: Self-pay | Admitting: Emergency Medicine

## 2021-03-13 DIAGNOSIS — N189 Chronic kidney disease, unspecified: Secondary | ICD-10-CM | POA: Diagnosis not present

## 2021-03-13 DIAGNOSIS — E1122 Type 2 diabetes mellitus with diabetic chronic kidney disease: Secondary | ICD-10-CM | POA: Diagnosis not present

## 2021-03-13 DIAGNOSIS — R109 Unspecified abdominal pain: Secondary | ICD-10-CM | POA: Diagnosis not present

## 2021-03-13 DIAGNOSIS — E860 Dehydration: Secondary | ICD-10-CM | POA: Insufficient documentation

## 2021-03-13 DIAGNOSIS — R531 Weakness: Secondary | ICD-10-CM | POA: Diagnosis present

## 2021-03-13 LAB — URINALYSIS, ROUTINE W REFLEX MICROSCOPIC
Bilirubin Urine: NEGATIVE
Glucose, UA: NEGATIVE mg/dL
Hgb urine dipstick: NEGATIVE
Leukocytes,Ua: NEGATIVE
Nitrite: NEGATIVE
Protein, ur: NEGATIVE mg/dL
Specific Gravity, Urine: 1.015 (ref 1.005–1.030)
pH: 5 (ref 5.0–8.0)

## 2021-03-13 LAB — BASIC METABOLIC PANEL
Anion gap: 7 (ref 5–15)
BUN: 23 mg/dL (ref 8–23)
CO2: 27 mmol/L (ref 22–32)
Calcium: 8.8 mg/dL — ABNORMAL LOW (ref 8.9–10.3)
Chloride: 101 mmol/L (ref 98–111)
Creatinine, Ser: 1.1 mg/dL (ref 0.61–1.24)
GFR, Estimated: >60 mL/min (ref >60)
Glucose, Bld: 113 mg/dL — ABNORMAL HIGH (ref 70–99)
Potassium: 4.1 mmol/L (ref 3.5–5.1)
Sodium: 135 mmol/L (ref 135–145)

## 2021-03-13 LAB — CBC
HCT: 38.3 % — ABNORMAL LOW (ref 39.0–52.0)
Hemoglobin: 12.5 g/dL — ABNORMAL LOW (ref 13.0–17.0)
MCH: 34.8 pg — ABNORMAL HIGH (ref 26.0–34.0)
MCHC: 32.6 g/dL (ref 30.0–36.0)
MCV: 106.7 fL — ABNORMAL HIGH (ref 80.0–100.0)
Platelets: 136 10*3/uL — ABNORMAL LOW (ref 150–400)
RBC: 3.59 MIL/uL — ABNORMAL LOW (ref 4.22–5.81)
RDW: 13.8 % (ref 11.5–15.5)
WBC: 17.2 10*3/uL — ABNORMAL HIGH (ref 4.0–10.5)
nRBC: 0 % (ref 0.0–0.2)

## 2021-03-13 MED ORDER — LACTATED RINGERS IV BOLUS
1000.0000 mL | Freq: Once | INTRAVENOUS | Status: AC
  Administered 2021-03-13: 10:00:00 1000 mL via INTRAVENOUS

## 2021-03-13 MED ORDER — IOHEXOL 300 MG/ML  SOLN
100.0000 mL | Freq: Once | INTRAMUSCULAR | Status: AC | PRN
  Administered 2021-03-13: 12:00:00 100 mL via INTRAVENOUS

## 2021-03-13 MED ORDER — SODIUM CHLORIDE 0.9 % IV BOLUS
500.0000 mL | Freq: Once | INTRAVENOUS | Status: AC
Start: 2021-03-13 — End: 2021-03-13
  Administered 2021-03-13: 13:00:00 500 mL via INTRAVENOUS

## 2021-03-13 NOTE — ED Notes (Signed)
Patient to CT scan

## 2021-03-13 NOTE — Discharge Instructions (Signed)
Your lab test today were all okay.  Please focus on increasing fluid intake throughout the day to stay sufficiently well-hydrated.  Follow-up with your doctor for continued evaluation.

## 2021-03-13 NOTE — ED Notes (Signed)
Patient to CT at this time

## 2021-03-13 NOTE — ED Notes (Signed)
Patient in and out cath as requested by Joni Fears, MD. Approx 250 mL removed. Sample sent to the lab.

## 2021-03-13 NOTE — ED Provider Notes (Signed)
[facility] Provider Note    Event Date/Time   First MD Initiated Contact with Patient 03/13/21 [phone]     (approximate)   History   Weakness   HPI  Allen Chang is a 88 y.o. male with a history of CLL, CKD, diabetes, dementia, atrial fibrillation who is brought to the ED due to generalized weakness.  Patient is unable to provide much history, he does deny any pain or any acute symptoms.  Wife reports that he has had decreased mobility recently, not drinking enough fluids, decreased urine output and urine appears dark.  She also notes that he had reported some back pain recently although he denies it now.   Physical Exam   Triage Vital Signs: ED Triage Vitals  Enc Vitals Group     BP 03/13/21 0901 (!) 119/58     Pulse Rate 03/13/21 0901 84     Resp 03/13/21 0901 17     Temp 03/13/21 0901 97.8 F (36.6 C)     Temp Source 03/13/21 0901 Oral     SpO2 03/13/21 0901 100 %     Weight 03/13/21 0902 180 lb 1.9 oz (81.7 kg)     Height 03/13/21 0902 5\' 8"  (1.727 m)     Head Circumference --      Peak Flow --      Pain Score 03/13/21 0901 5     Pain Loc --      Pain Edu? --      Excl. in [place]? --     Most recent vital signs: Vitals:   03/13/21 1130 03/13/21 1323  BP: (!) 134/57 (!) 119/45  Pulse: 64 68  Resp: 15 (!) 23  Temp:    SpO2: 94% 100%     General: Awake, no distress.  CV:  Good peripheral perfusion.  Regular rate. Resp:  Normal effort.  Unlabored, clear to auscultation bilaterally Abd:  No distention.  Soft, nontender Other:  No midline spinal tenderness.  Moves all extremities   ED Results / Procedures / Treatments   Labs (all labs ordered are listed, but only abnormal results are displayed) Labs Reviewed  BASIC METABOLIC PANEL - Abnormal; Notable for the following components:      Result Value   Glucose, Bld 113 (*)    Calcium 8.8 (*)    All other components within normal limits  CBC - Abnormal; Notable for the following  components:   WBC 17.2 (*)    RBC 3.59 (*)    Hemoglobin 12.5 (*)    HCT 38.3 (*)    MCV 106.7 (*)    MCH 34.8 (*)    Platelets 136 (*)    All other components within normal limits  URINALYSIS, ROUTINE W REFLEX MICROSCOPIC - Abnormal; Notable for the following components:   Ketones, ur TRACE (*)    All other components within normal limits  CBG MONITORING, ED     EKG  Interpreted by me Normal sinus rhythm rate of 61, normal axis and intervals.  Normal QRS ST segments and T waves.  No acute ischemic changes.   RADIOLOGY Chest x-ray viewed and interpreted by me, appears unremarkable.  Radiology report reviewed.  CT abdomen pelvis unremarkable  PROCEDURES:  Critical Care performed: No  Procedures   MEDICATIONS ORDERED IN ED: Medications  lactated ringers bolus 1,000 mL (0 mLs Intravenous Stopped 03/13/21 1138)  iohexol (OMNIPAQUE) 300 MG/ML solution 100 mL (100 mLs Intravenous Contrast Given 03/13/21 1203)  sodium chloride  0.9 % bolus 500 mL (500 mLs Intravenous New Bag/Given 03/13/21 1323)     IMPRESSION / MDM / ASSESSMENT AND PLAN / ED COURSE  I reviewed the triage vital signs and the nursing notes.                              Differential diagnosis includes, but is not limited to, dehydration, electrolyte abnormality, acute renal insufficiency, anemia, deconditioning, pneumonia     Patient presents with generalized weakness, likely dehydration.  Will check labs give IV fluids.  ----------------------------------------- 1:34 PM on 03/13/2021 ----------------------------------------- Labs unremarkable.  Urinalysis negative.  No acute findings on CT.  Stable for discharge      FINAL CLINICAL IMPRESSION(S) / ED DIAGNOSES   Final diagnoses:  Dehydration  Generalized weakness     Rx / DC Orders   ED Discharge Orders     None        Note:  This document was prepared using Dragon voice recognition software and may include unintentional dictation  errors.   Carrie Mew, MD 03/13/21 1335

## 2021-03-13 NOTE — ED Notes (Signed)
Patient given ginger ale and graham crackers for po challenge.

## 2021-03-13 NOTE — ED Triage Notes (Signed)
Pt comes into the ED via POV c/o weakness as well as darker urine with less output.  Per the wife the weakness has been ongoing because he had COVID which then went to pneumonia since the first of December.  Wife is concerned about kidney problems due to the urine and back pain.  Pt in NAD.

## 2021-04-10 ENCOUNTER — Other Ambulatory Visit: Payer: Self-pay | Admitting: *Deleted

## 2021-04-10 DIAGNOSIS — C9110 Chronic lymphocytic leukemia of B-cell type not having achieved remission: Secondary | ICD-10-CM

## 2021-04-18 ENCOUNTER — Inpatient Hospital Stay: Payer: Medicare Other | Attending: Oncology

## 2021-04-18 ENCOUNTER — Other Ambulatory Visit: Payer: Self-pay

## 2021-04-18 DIAGNOSIS — D696 Thrombocytopenia, unspecified: Secondary | ICD-10-CM | POA: Insufficient documentation

## 2021-04-18 DIAGNOSIS — C9110 Chronic lymphocytic leukemia of B-cell type not having achieved remission: Secondary | ICD-10-CM | POA: Diagnosis present

## 2021-04-18 DIAGNOSIS — D472 Monoclonal gammopathy: Secondary | ICD-10-CM | POA: Diagnosis not present

## 2021-04-18 DIAGNOSIS — D509 Iron deficiency anemia, unspecified: Secondary | ICD-10-CM | POA: Diagnosis not present

## 2021-04-18 DIAGNOSIS — Z79899 Other long term (current) drug therapy: Secondary | ICD-10-CM | POA: Diagnosis not present

## 2021-04-18 DIAGNOSIS — N1831 Chronic kidney disease, stage 3a: Secondary | ICD-10-CM | POA: Diagnosis not present

## 2021-04-18 LAB — CBC WITH DIFFERENTIAL/PLATELET
Abs Immature Granulocytes: 0.04 10*3/uL (ref 0.00–0.07)
Basophils Absolute: 0 10*3/uL (ref 0.0–0.1)
Basophils Relative: 0 %
Eosinophils Absolute: 0.4 10*3/uL (ref 0.0–0.5)
Eosinophils Relative: 3 %
HCT: 38.9 % — ABNORMAL LOW (ref 39.0–52.0)
Hemoglobin: 12.8 g/dL — ABNORMAL LOW (ref 13.0–17.0)
Immature Granulocytes: 0 %
Lymphocytes Relative: 64 %
Lymphs Abs: 8.2 10*3/uL — ABNORMAL HIGH (ref 0.7–4.0)
MCH: 35.1 pg — ABNORMAL HIGH (ref 26.0–34.0)
MCHC: 32.9 g/dL (ref 30.0–36.0)
MCV: 106.6 fL — ABNORMAL HIGH (ref 80.0–100.0)
Monocytes Absolute: 1.5 10*3/uL — ABNORMAL HIGH (ref 0.1–1.0)
Monocytes Relative: 11 %
Neutro Abs: 2.8 10*3/uL (ref 1.7–7.7)
Neutrophils Relative %: 22 %
Platelets: 148 10*3/uL — ABNORMAL LOW (ref 150–400)
RBC: 3.65 MIL/uL — ABNORMAL LOW (ref 4.22–5.81)
RDW: 13.4 % (ref 11.5–15.5)
Smear Review: NORMAL
WBC: 12.9 10*3/uL — ABNORMAL HIGH (ref 4.0–10.5)
nRBC: 0 % (ref 0.0–0.2)

## 2021-04-18 LAB — COMPREHENSIVE METABOLIC PANEL
ALT: 18 U/L (ref 0–44)
AST: 24 U/L (ref 15–41)
Albumin: 3.8 g/dL (ref 3.5–5.0)
Alkaline Phosphatase: 77 U/L (ref 38–126)
Anion gap: 7 (ref 5–15)
BUN: 19 mg/dL (ref 8–23)
CO2: 31 mmol/L (ref 22–32)
Calcium: 9.6 mg/dL (ref 8.9–10.3)
Chloride: 100 mmol/L (ref 98–111)
Creatinine, Ser: 1.22 mg/dL (ref 0.61–1.24)
GFR, Estimated: 58 mL/min — ABNORMAL LOW (ref >60)
Glucose, Bld: 167 mg/dL — ABNORMAL HIGH (ref 70–99)
Potassium: 4.1 mmol/L (ref 3.5–5.1)
Sodium: 138 mmol/L (ref 135–145)
Total Bilirubin: 0.4 mg/dL (ref 0.3–1.2)
Total Protein: 7 g/dL (ref 6.5–8.1)

## 2021-04-18 LAB — LACTATE DEHYDROGENASE: LDH: 107 U/L (ref 98–192)

## 2021-04-22 LAB — COMP PANEL: LEUKEMIA/LYMPHOMA: Immunophenotypic Profile: 58

## 2021-04-26 ENCOUNTER — Inpatient Hospital Stay (HOSPITAL_BASED_OUTPATIENT_CLINIC_OR_DEPARTMENT_OTHER): Payer: Medicare Other | Admitting: Oncology

## 2021-04-26 ENCOUNTER — Other Ambulatory Visit: Payer: Self-pay

## 2021-04-26 ENCOUNTER — Encounter: Payer: Self-pay | Admitting: Oncology

## 2021-04-26 VITALS — BP 165/77 | HR 47 | Temp 96.5°F | Resp 18 | Wt 170.1 lb

## 2021-04-26 DIAGNOSIS — C9110 Chronic lymphocytic leukemia of B-cell type not having achieved remission: Secondary | ICD-10-CM

## 2021-04-26 DIAGNOSIS — D472 Monoclonal gammopathy: Secondary | ICD-10-CM

## 2021-04-26 DIAGNOSIS — N1831 Chronic kidney disease, stage 3a: Secondary | ICD-10-CM

## 2021-04-26 NOTE — Progress Notes (Signed)
Pt here for follow up. No new concerns voiced.   

## 2021-04-26 NOTE — Progress Notes (Signed)
Hematology/Oncology Follow Up Note [phone]   CONSULT NOTE Patient Care Team: Tracie Harrier, MD as PCP - General (Internal Medicine)  REASON FOR VISIT  follow up for CLL and iron deficiency anemia  HISTORY OF PRESENTING ILLNESS:  Allen Chang 88 y.o.  male with PMH listed as below who was referred by primary care provider to me for evaluation of leukocytosis. He has a history of prostate cancer which was diagnosed 10 years ago. He underwent brachytherapy. He is not any castration treatment. Per patient, he follows up with Dr.Wolf and most recent PSA is normal.   Patient also reports feeling fatigue lately. Recent labs show leukocytosis, mild anemia, macrocytosis, mild thrombocytopenia, low ferritin. Denies blood in the stool. He was started on over the counter iron supplement but wife who manages patient's medication decides to only let patient to take iron medication every other day.   # received IV iron Venofer x 4. Colonoscopy was done on 02/16/2017 which showed polyps, internal hemorrhoids, negative for malignancy.  ##Patient had bone marrow biopsy on 09/22/2018 Pathology showed hypercellular bone marrow with extensive involvement by chronic lymphocytic leukemia. # Started on acalabrutinib 100 mg twice daily on 10/01/2018. 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  #August 2020-acalabrutinib 100 mg twice daily Stopped in October 2021 during his admission.  # admitted from 12/16/19- 10/30/ 2021 due to sudden onset chest pain. Troponins were negative in the emergency room.  Initial EKG showed atrial fibrillation with heart rate of 55. Patient was seen by cardiology and clarified that the EKG and telemetry actually showed sinus rhythm with PACs patient did not require any anticoagulation.Echocardiogram showed normal EF and no wall motion abnormality.  Nuclear stress testing showed low risk study, EF 63%. Patient was discharged home and followed up  with Dr. Clayborn Bigness on January 26, 2020 Currently off of acalabrutinib since October 2021 admission.  INTERVAL HISTORY Allen Chang is a 88 y.o. male who has above history reviewed by me presents for 4 months follow up for CLL and iron deficiency anemia, MGUS Today patient was accompanied by his wife He has good appetite, he has lost weight since last visit, he had COVID19 infection, community acquired pneumonia. Denies night sweat, fever.   Review of Systems  Constitutional:  Negative for appetite change, chills, fatigue, fever and unexpected weight change.  HENT:   Negative for hearing loss and voice change.   Eyes:  Negative for eye problems and icterus.  Respiratory:  Negative for chest tightness, cough and shortness of breath.   Cardiovascular:  Negative for chest pain and leg swelling.  Gastrointestinal:  Negative for abdominal distention and abdominal pain.  Endocrine: Negative for hot flashes.  Genitourinary:  Negative for difficulty urinating, dysuria and frequency.   Musculoskeletal:  Positive for arthralgias. Negative for back pain.  Skin:  Negative for itching and rash.       Skin cancer  Neurological:  Negative for light-headedness and numbness.  Hematological:  Negative for adenopathy. Does not bruise/bleed easily.  Psychiatric/Behavioral:  Negative for confusion.    MEDICAL HISTORY:  Past Medical History:  Diagnosis Date   Cancer [facility])    prostate    CKD (chronic kidney disease) stage 3, GFR 30-59 ml/min (HCC) 0/27/2536   Complication of anesthesia    bradycardia, in ICU for 24 hour after galbladder surgery.    Diverticulosis 2012   Dysrhythmia    Heart skips a beat   Elevated lipids    GERD (gastroesophageal  reflux disease)    History of hiatal hernia    Hypertension    Hypothyroidism    Leukemia ([place])    Prostate cancer ([place])    Skin cancer    face, scalp, behind ear,back and hand   Tremors of nervous system     SURGICAL HISTORY: Past Surgical  History:  Procedure Laterality Date   BLADDER TUMOR EXCISION     CARDIAC CATHETERIZATION     CHOLECYSTECTOMY N/A 10/10/2014   Procedure: LAPAROSCOPIC CHOLECYSTECTOMY WITH INTRAOPERATIVE CHOLANGIOGRAM;  Surgeon: Leonie Green, MD;  Location: [facility] ORS;  Service: General;  Laterality: N/A;   COLONOSCOPY WITH PROPOFOL N/A 02/16/2017   Procedure: COLONOSCOPY WITH PROPOFOL;  Surgeon: Manya Silvas, MD;  Location: [facility] ENDOSCOPY;  Service: Endoscopy;  Laterality: N/A;   ESOPHAGOGASTRODUODENOSCOPY (EGD) WITH PROPOFOL N/A 02/16/2017   Procedure: ESOPHAGOGASTRODUODENOSCOPY (EGD) WITH PROPOFOL;  Surgeon: Manya Silvas, MD;  Location: [facility] ENDOSCOPY;  Service: Endoscopy;  Laterality: N/A;   HEMORRHOID SURGERY     HERNIA REPAIR Left    x2   JOINT REPLACEMENT Left    Partial Knee Replacement, Dr. Roland Rack   PARTIAL KNEE ARTHROPLASTY Left 12/12/2014   Procedure: UNICOMPARTMENTAL KNEE;  Surgeon: Corky Mull, MD;  Location: [facility] ORS;  Service: Orthopedics;  Laterality: Left;   PARTIAL KNEE ARTHROPLASTY Right 09/25/2020   Procedure: RIGHT PARTIAL KNEE REPLACEMENT;  Surgeon: Corky Mull, MD;  Location: [facility] ORS;  Service: Orthopedics;  Laterality: Right;   prostate seeding     SHOULDER ARTHROSCOPY Right    SHOULDER ARTHROSCOPY WITH OPEN ROTATOR CUFF REPAIR Left 02/07/2016   Procedure: SHOULDER ARTHROSCOPY WITH OPEN ROTATOR CUFF REPAIR;  Surgeon: Corky Mull, MD;  Location: [facility] ORS;  Service: Orthopedics;  Laterality: Left;    SOCIAL HISTORY: Social History   Socioeconomic History   Marital status: Married    Spouse name: Not on file   Number of children: Not on file   Years of education: Not on file   Highest education level: Not on file  Occupational History   Not on file  Tobacco Use   Smoking status: Never   Smokeless tobacco: Never  Vaping Use   Vaping Use: Never used  Substance and Sexual Activity   Alcohol use: No   Drug use: No   Sexual activity: Yes  Other Topics  Concern   Not on file  Social History Narrative   Not on file   Social Determinants of Health   Financial Resource Strain: Not on file  Food Insecurity: Not on file  Transportation Needs: Not on file  Physical Activity: Not on file  Stress: Not on file  Social Connections: Not on file  Intimate Partner Violence: Not on file    FAMILY HISTORY: Family History  Problem Relation Age of Onset   Bone cancer Father    Hypertension Mother    Osteoporosis Mother    Breast cancer Sister    Cancer Brother    Cancer Brother    Lung cancer Brother    Bladder Cancer Brother    Melanoma Brother     ALLERGIES:  is allergic to oxycodone-acetaminophen and voltaren [diclofenac sodium].  MEDICATIONS:  Current Outpatient Medications  Medication Sig Dispense Refill   acetaminophen (TYLENOL) 500 MG tablet Take 1,000 mg by mouth every 8 (eight) hours as needed for moderate pain.     albuterol (VENTOLIN HFA) 108 (90 Base) MCG/ACT inhaler Inhale 2 puffs into the lungs every 6 (six) hours as needed for wheezing or  shortness of breath. 8 g 0   ascorbic acid (VITAMIN C) 1000 MG tablet Take 1,000 mg by mouth daily.     b complex vitamins capsule Take 1 capsule by mouth daily.     calcium carbonate (OSCAL) 1500 (600 Ca) MG TABS tablet Take 1,500 mg by mouth daily with breakfast.     Cholecalciferol 25 MCG (1000 UT) tablet Take 1,000 Units by mouth daily.     donepezil (ARICEPT) 10 MG tablet Take 10 mg by mouth at bedtime.     DULoxetine (CYMBALTA) 20 MG capsule Take 20 mg by mouth daily.     famotidine (PEPCID) 40 MG tablet Take 40 mg by mouth 2 (two) times daily.     ferrous sulfate 325 (65 FE) MG EC tablet Take 325 mg by mouth 3 (three) times daily with meals.     Flaxseed, Linseed, (FLAX SEED OIL) 1000 MG CAPS Take 1,000 mg by mouth daily.     gabapentin (NEURONTIN) 100 MG capsule Take 100 mg by mouth 3 (three) times daily.      Garlic 1696 MG CAPS Take 1 capsule by mouth every morning.      Ginkgo Biloba (GINKOBA PO) Take 2 tablets by mouth daily.     latanoprost (XALATAN) 0.005 % ophthalmic solution Place 1 drop into both eyes at bedtime.     levothyroxine (SYNTHROID) 50 MCG tablet Take 50 mcg by mouth daily before breakfast. Take on an empty stomach with a glass of water at least 30-60 minutes before breakfast.     loratadine (CLARITIN REDITABS) 10 MG dissolvable tablet Take 10 mg by mouth daily. In am     Misc Natural Products (BLACK CHERRY CONCENTRATE) LIQD Take 15 mLs by mouth daily.      MISC NATURAL PRODUCTS PO Take 2 capsules by mouth daily. Beet juice capsules     Omega-3 Fatty Acids (FISH OIL) 1000 MG CAPS Take 1 capsule by mouth daily.     OVER THE COUNTER MEDICATION Take 1 capsule by mouth daily. Neuriva     OVER THE COUNTER MEDICATION Take 1 capsule by mouth daily. Ageless Brain     OVER THE COUNTER MEDICATION Take 1,000 mg by mouth daily. BACOPA     pantoprazole (PROTONIX) 40 MG tablet Take 40 mg by mouth 2 (two) times daily.     primidone (MYSOLINE) 50 MG tablet Take 50 mg by mouth 4 (four) times daily.     propranolol (INDERAL) 10 MG tablet Take 10 mg by mouth 2 (two) times daily.     vitamin E 180 MG (400 UNITS) capsule Take 400 Units by mouth daily.     zinc gluconate 50 MG tablet Take 50 mg by mouth daily.     No current facility-administered medications for this visit.      Marland Kitchen  PHYSICAL EXAMINATION: ECOG PERFORMANCE STATUS: 1 - Symptomatic but completely ambulatory Vitals:   04/26/21 1156  BP: (!) 165/77  Pulse: (!) 47  Resp: 18  Temp: (!) 96.5 F (35.8 C)   Filed Weights   04/26/21 1156  Weight: 170 lb 1.6 oz (77.2 kg)    Physical Exam Constitutional:      General: He is not in acute distress.    Appearance: He is not diaphoretic.     Comments: Patient ambulates independently  HENT:     Head: Normocephalic and atraumatic.     Nose: Nose normal.     Mouth/Throat:     Pharynx: No oropharyngeal exudate.  Eyes:  General: No scleral  icterus.    Pupils: Pupils are equal, round, and reactive to light.  Cardiovascular:     Rate and Rhythm: Normal rate.     Heart sounds: No murmur heard. Pulmonary:     Effort: Pulmonary effort is normal. No respiratory distress.     Breath sounds: No rales.  Chest:     Chest wall: No tenderness.  Abdominal:     General: There is no distension.     Palpations: Abdomen is soft.     Tenderness: There is no abdominal tenderness.  Musculoskeletal:        General: Normal range of motion.     Cervical back: Normal range of motion and neck supple.     Comments: Status post right knee replacement  Skin:    General: Skin is warm and dry.     Findings: No erythema.  Neurological:     Mental Status: He is alert and oriented to person, place, and time.  Psychiatric:        Mood and Affect: Affect normal.  .    LABORATORY DATA:  I have reviewed the data as listed Lab Results  Component Value Date   WBC 12.9 (H) 04/18/2021   HGB 12.8 (L) 04/18/2021   HCT 38.9 (L) 04/18/2021   MCV 106.6 (H) 04/18/2021   PLT 148 (L) 04/18/2021   Recent Labs    01/09/21 1100 01/24/21 0729 02/02/21 1747 02/03/21 1701 03/13/21 1023 04/18/21 1035  NA 136   < > 129* 134* 135 138  K 4.4   < > 4.8 4.1 4.1 4.1  CL 97*   < > 98 102 101 100  CO2 31   < > '23 26 27 31  ' GLUCOSE 114*   < > 105* 106* 113* 167*  BUN 17   < > 24* '16 23 19  ' CREATININE 1.12   < > 1.31* 1.07 1.10 1.22  CALCIUM 9.6   < > 9.5 8.6* 8.8* 9.6  GFRNONAA >60   < > 53* >60 >60 58*  PROT 7.2  --  7.3  --   --  7.0  ALBUMIN 4.0  --  3.6  --   --  3.8  AST 20  --  22  --   --  24  ALT 16  --  18  --   --  18  ALKPHOS 102  --  86  --   --  77  BILITOT 0.4  --  0.7  --   --  0.4   < > = values in this interval not displayed.     RADIOGRAPHIC STUDIES: I have personally reviewed the radiological images as listed and agreed with the findings in the report. no recent images. 11/04/2017  US abdomen Stable right renal cyst. Status post  cholecystectomy.Previously seen lesion within the liver is not well appreciated on this exam. No Splenomegaly.    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  Cytogenetics revealed Trisomy 12 IGVH unmutated.  ASSESSMENT & PLAN:  1. CLL (chronic lymphocytic leukemia) ([place])   2. MGUS (monoclonal gammopathy of unknown significance)   3. Stage 3a chronic kidney disease [facility])    Per patient's request, I also called patient's daughter Anderson Malta and updated her. # CLL, intermediate risk with trisomy 12. IgVH unmutated. Stage III, extensive bone marrow involvement. Currently off acalabrutinib 100 mg twice daily Peripheral blood flow cytometry showed CLL population, 58% of total cells. Continue watchful waiting.  MGUS, IgG lambda and IgM kappa clones. 02/14/2020 M protein increased from 2.3 to1.1. 04/03/2020, M protein 1.0. 07/06/2020 M protein 0.9, IgG lambda and new IgM kappa clone 10/05/2020, M protein 1.0 IgG lambda and new IgM kappa clone 01/09/2021 M protein 1.0 IgG lambda and new IgM kappa clone Check labs every 6 months.   #CKD,  avoid nephrotoxins.  Encourage oral hydration. #Thrombocytopenia, mild. but stable.  Follow up in 3 months  [name], MD, PhD  04/26/2021

## 2021-04-27 ENCOUNTER — Encounter: Payer: Self-pay | Admitting: Oncology

## 2021-07-25 ENCOUNTER — Inpatient Hospital Stay: Payer: Medicare Other | Attending: Oncology

## 2021-07-25 ENCOUNTER — Other Ambulatory Visit: Payer: Self-pay

## 2021-07-25 DIAGNOSIS — I129 Hypertensive chronic kidney disease with stage 1 through stage 4 chronic kidney disease, or unspecified chronic kidney disease: Secondary | ICD-10-CM | POA: Diagnosis not present

## 2021-07-25 DIAGNOSIS — N1831 Chronic kidney disease, stage 3a: Secondary | ICD-10-CM | POA: Insufficient documentation

## 2021-07-25 DIAGNOSIS — D696 Thrombocytopenia, unspecified: Secondary | ICD-10-CM | POA: Diagnosis not present

## 2021-07-25 DIAGNOSIS — D472 Monoclonal gammopathy: Secondary | ICD-10-CM | POA: Insufficient documentation

## 2021-07-25 DIAGNOSIS — Z79899 Other long term (current) drug therapy: Secondary | ICD-10-CM | POA: Insufficient documentation

## 2021-07-25 DIAGNOSIS — C9110 Chronic lymphocytic leukemia of B-cell type not having achieved remission: Secondary | ICD-10-CM | POA: Diagnosis present

## 2021-07-25 LAB — COMPREHENSIVE METABOLIC PANEL
ALT: 16 U/L (ref 0–44)
AST: 24 U/L (ref 15–41)
Albumin: 3.8 g/dL (ref 3.5–5.0)
Alkaline Phosphatase: 65 U/L (ref 38–126)
Anion gap: 7 (ref 5–15)
BUN: 29 mg/dL — ABNORMAL HIGH (ref 8–23)
CO2: 30 mmol/L (ref 22–32)
Calcium: 9.2 mg/dL (ref 8.9–10.3)
Chloride: 103 mmol/L (ref 98–111)
Creatinine, Ser: 1.24 mg/dL (ref 0.61–1.24)
GFR, Estimated: 56 mL/min — ABNORMAL LOW (ref >60)
Glucose, Bld: 116 mg/dL — ABNORMAL HIGH (ref 70–99)
Potassium: 4.3 mmol/L (ref 3.5–5.1)
Sodium: 140 mmol/L (ref 135–145)
Total Bilirubin: 1.1 mg/dL (ref 0.3–1.2)
Total Protein: 7.1 g/dL (ref 6.5–8.1)

## 2021-07-25 LAB — CBC WITH DIFFERENTIAL/PLATELET
Abs Immature Granulocytes: 0.05 10*3/uL (ref 0.00–0.07)
Basophils Absolute: 0.1 10*3/uL (ref 0.0–0.1)
Basophils Relative: 0 %
Eosinophils Absolute: 0.4 10*3/uL (ref 0.0–0.5)
Eosinophils Relative: 2 %
HCT: 38.8 % — ABNORMAL LOW (ref 39.0–52.0)
Hemoglobin: 12.7 g/dL — ABNORMAL LOW (ref 13.0–17.0)
Immature Granulocytes: 0 %
Lymphocytes Relative: 75 %
Lymphs Abs: 11.7 10*3/uL — ABNORMAL HIGH (ref 0.7–4.0)
MCH: 35 pg — ABNORMAL HIGH (ref 26.0–34.0)
MCHC: 32.7 g/dL (ref 30.0–36.0)
MCV: 106.9 fL — ABNORMAL HIGH (ref 80.0–100.0)
Monocytes Absolute: 1.5 10*3/uL — ABNORMAL HIGH (ref 0.1–1.0)
Monocytes Relative: 10 %
Neutro Abs: 2.1 10*3/uL (ref 1.7–7.7)
Neutrophils Relative %: 13 %
Platelets: 109 10*3/uL — ABNORMAL LOW (ref 150–400)
RBC: 3.63 MIL/uL — ABNORMAL LOW (ref 4.22–5.81)
RDW: 13.3 % (ref 11.5–15.5)
Smear Review: NORMAL
WBC Morphology: ABNORMAL
WBC: 15.9 10*3/uL — ABNORMAL HIGH (ref 4.0–10.5)
nRBC: 0 % (ref 0.0–0.2)

## 2021-07-25 LAB — LACTATE DEHYDROGENASE: LDH: 98 U/L (ref 98–192)

## 2021-07-26 LAB — KAPPA/LAMBDA LIGHT CHAINS
Kappa free light chain: 60.5 mg/L — ABNORMAL HIGH (ref 3.3–19.4)
Kappa, lambda light chain ratio: 1.21 (ref 0.26–1.65)
Lambda free light chains: 49.9 mg/L — ABNORMAL HIGH (ref 5.7–26.3)

## 2021-07-28 ENCOUNTER — Encounter: Payer: Self-pay | Admitting: Oncology

## 2021-07-28 DIAGNOSIS — D472 Monoclonal gammopathy: Secondary | ICD-10-CM | POA: Insufficient documentation

## 2021-07-29 LAB — MULTIPLE MYELOMA PANEL, SERUM
Albumin SerPl Elph-Mcnc: 3.7 g/dL (ref 2.9–4.4)
Albumin/Glob SerPl: 1.5 (ref 0.7–1.7)
Alpha 1: 0.1 g/dL (ref 0.0–0.4)
Alpha2 Glob SerPl Elph-Mcnc: 0.6 g/dL (ref 0.4–1.0)
B-Globulin SerPl Elph-Mcnc: 0.7 g/dL (ref 0.7–1.3)
Gamma Glob SerPl Elph-Mcnc: 1.2 g/dL (ref 0.4–1.8)
Globulin, Total: 2.6 g/dL (ref 2.2–3.9)
IgA: 53 mg/dL — ABNORMAL LOW (ref 61–437)
IgG (Immunoglobin G), Serum: 1179 mg/dL (ref 603–1613)
IgM (Immunoglobulin M), Srm: 125 mg/dL (ref 15–143)
M Protein SerPl Elph-Mcnc: 0.8 g/dL — ABNORMAL HIGH
Total Protein ELP: 6.3 g/dL (ref 6.0–8.5)

## 2021-08-02 ENCOUNTER — Inpatient Hospital Stay (HOSPITAL_BASED_OUTPATIENT_CLINIC_OR_DEPARTMENT_OTHER): Payer: Medicare Other | Admitting: Oncology

## 2021-08-02 ENCOUNTER — Encounter: Payer: Self-pay | Admitting: Oncology

## 2021-08-02 VITALS — BP 143/72 | HR 52 | Temp 97.1°F | Wt 170.0 lb

## 2021-08-02 DIAGNOSIS — D696 Thrombocytopenia, unspecified: Secondary | ICD-10-CM

## 2021-08-02 DIAGNOSIS — D472 Monoclonal gammopathy: Secondary | ICD-10-CM | POA: Diagnosis not present

## 2021-08-02 DIAGNOSIS — C9110 Chronic lymphocytic leukemia of B-cell type not having achieved remission: Secondary | ICD-10-CM

## 2021-08-02 DIAGNOSIS — N1831 Chronic kidney disease, stage 3a: Secondary | ICD-10-CM | POA: Diagnosis not present

## 2021-08-02 NOTE — Assessment & Plan Note (Signed)
Stable counts. monitor 

## 2021-08-02 NOTE — Assessment & Plan Note (Addendum)
CLL, intermediate risk with trisomy 3. IgVH unmutated. Stage III, extensive bone marrow involvement. Currently off acalabrutinib 100 mg twice daily Leukocytosis is slightly worse.  Previous peripheral blood flow cytometry showed CLL population, 58% of total cells. Continue watchful waiting.

## 2021-08-02 NOTE — Assessment & Plan Note (Signed)
avoid nephrotoxins. Encourage oral hydration 

## 2021-08-02 NOTE — Assessment & Plan Note (Signed)
Labs are reviewed and discussed with patient. Stable M protein and normal light chain ration.  Continue observation. Repeat labs every 6 months.  

## 2021-08-02 NOTE — Progress Notes (Signed)
Hematology/Oncology Follow Up Note [phone]   CONSULT NOTE Patient Care Team: Tracie Harrier, MD as PCP - General (Internal Medicine)   ASSESSMENT & PLAN:  Per patient's request, I also called patient's daughter Anderson Malta and updated her. 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 Stage III, extensive bone marrow involvement. Currently off acalabrutinib 100 mg twice daily Leukocytosis is slightly worse.  Previous peripheral blood flow cytometry showed CLL population, 58% of total cells. Continue watchful waiting.   Chronic kidney disease (CKD) stage G3a/A1, moderately decreased glomerular filtration rate (GFR) between 45-59 mL/min/1.73 square meter and albuminuria creatinine ratio less than 30 mg/g (HCC) avoid nephrotoxins.  Encourage oral hydration.  Thrombocytopenia (HCC) Stable counts. monitor  MGUS (monoclonal gammopathy of unknown significance) Labs are reviewed and discussed with patient. Stable M protein and normal light chain ration.  Continue observation. Repeat labs every 6 months.   Orders Placed This Encounter  Procedures   CBC with Differential/Platelet    Standing Status:   Future    Standing Expiration Date:   08/03/2022   Comprehensive metabolic panel    Standing Status:   Future    Standing Expiration Date:   08/03/2022   Lactate dehydrogenase    Standing Status:   Future    Standing Expiration Date:   08/03/2022   Follow up in 3 months.  All questions were answered. The patient knows to call the clinic with any problems, questions or concerns.  [name], MD, PhD [facility] Hematology Oncology 08/02/2021   REASON FOR VISIT  follow up for CLL and iron deficiency anemia  HISTORY OF PRESENTING ILLNESS:  Allen Chang 88 y.o.  male with PMH listed as below who was referred by primary care provider to me for evaluation of leukocytosis. He has a history of prostate cancer which was  diagnosed 10 years ago. He underwent brachytherapy. He is not any castration treatment. Per patient, he follows up with Dr.Wolf and most recent PSA is normal.   Patient also reports feeling fatigue lately. Recent labs show leukocytosis, mild anemia, macrocytosis, mild thrombocytopenia, low ferritin. Denies blood in the stool. He was started on over the counter iron supplement but wife who manages patient's medication decides to only let patient to take iron medication every other day.   # received IV iron Venofer x 4. Colonoscopy was done on 02/16/2017 which showed polyps, internal hemorrhoids, negative for malignancy.  ##Patient had bone marrow biopsy on 09/22/2018 Pathology showed hypercellular bone marrow with extensive involvement by chronic lymphocytic leukemia. # Started on acalabrutinib 100 mg twice daily on 10/01/2018. 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  #August 2020-acalabrutinib 100 mg twice daily Stopped in October 2021 during his admission.  # admitted from 12/16/19- 10/30/ 2021 due to sudden onset chest pain. Troponins were negative in the emergency room.  Initial EKG showed atrial fibrillation with heart rate of 55. Patient was seen by cardiology and clarified that the EKG and telemetry actually showed sinus rhythm with PACs patient did not require any anticoagulation.Echocardiogram showed normal EF and no wall motion abnormality.  Nuclear stress testing showed low risk study, EF 63%. Patient was discharged home and followed up with Dr. Clayborn Bigness on January 26, 2020 Currently off of acalabrutinib since October 2021 admission.  INTERVAL HISTORY Allen Chang is a 88 y.o. male who has above history reviewed by me presents for 4 months follow up for CLL and iron deficiency anemia, MGUS Today  patient was accompanied by his wife. 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  Good appetite. Denies weight loss, fever, chills, fatigue, night sweats.    Review of  Systems  Constitutional:  Negative for appetite change, chills, fatigue, fever and unexpected weight change.  HENT:   Negative for hearing loss and voice change.   Eyes:  Negative for eye problems and icterus.  Respiratory:  Negative for chest tightness, cough and shortness of breath.   Cardiovascular:  Negative for chest pain and leg swelling.  Gastrointestinal:  Negative for abdominal distention and abdominal pain.  Endocrine: Negative for hot flashes.  Genitourinary:  Negative for difficulty urinating, dysuria and frequency.   Musculoskeletal:  Positive for arthralgias. Negative for back pain.  Skin:  Negative for itching and rash.       Skin cancer  Neurological:  Negative for light-headedness and numbness.  Hematological:  Negative for adenopathy. Does not bruise/bleed easily.  Psychiatric/Behavioral:  Negative for confusion.     MEDICAL HISTORY:  Past Medical History:  Diagnosis Date   Cancer [facility])    prostate    CKD (chronic kidney disease) stage 3, GFR 30-59 ml/min (HCC) 8/56/3149   Complication of anesthesia    bradycardia, in ICU for 24 hour after galbladder surgery.    Diverticulosis 2012   Dysrhythmia    Heart skips a beat   Elevated lipids    GERD (gastroesophageal reflux disease)    History of hiatal hernia    Hypertension    Hypothyroidism    Leukemia (HCC)    Prostate cancer ([place])    Skin cancer    face, scalp, behind ear,back and hand   Tremors of nervous system     SURGICAL HISTORY: Past Surgical History:  Procedure Laterality Date   BLADDER TUMOR EXCISION     CARDIAC CATHETERIZATION     CHOLECYSTECTOMY N/A 10/10/2014   Procedure: LAPAROSCOPIC CHOLECYSTECTOMY WITH INTRAOPERATIVE CHOLANGIOGRAM;  Surgeon: Leonie Green, MD;  Location: [facility] ORS;  Service: General;  Laterality: N/A;   COLONOSCOPY WITH PROPOFOL N/A 02/16/2017   Procedure: COLONOSCOPY WITH PROPOFOL;  Surgeon: Manya Silvas, MD;  Location: [facility] ENDOSCOPY;  Service: Endoscopy;   Laterality: N/A;   ESOPHAGOGASTRODUODENOSCOPY (EGD) WITH PROPOFOL N/A 02/16/2017   Procedure: ESOPHAGOGASTRODUODENOSCOPY (EGD) WITH PROPOFOL;  Surgeon: Manya Silvas, MD;  Location: [facility] ENDOSCOPY;  Service: Endoscopy;  Laterality: N/A;   HEMORRHOID SURGERY     HERNIA REPAIR Left    x2   JOINT REPLACEMENT Left    Partial Knee Replacement, Dr. Roland Rack   PARTIAL KNEE ARTHROPLASTY Left 12/12/2014   Procedure: UNICOMPARTMENTAL KNEE;  Surgeon: Corky Mull, MD;  Location: [facility] ORS;  Service: Orthopedics;  Laterality: Left;   PARTIAL KNEE ARTHROPLASTY Right 09/25/2020   Procedure: RIGHT PARTIAL KNEE REPLACEMENT;  Surgeon: Corky Mull, MD;  Location: [facility] ORS;  Service: Orthopedics;  Laterality: Right;   prostate seeding     SHOULDER ARTHROSCOPY Right    SHOULDER ARTHROSCOPY WITH OPEN ROTATOR CUFF REPAIR Left 02/07/2016   Procedure: SHOULDER ARTHROSCOPY WITH OPEN ROTATOR CUFF REPAIR;  Surgeon: Corky Mull, MD;  Location: [facility] ORS;  Service: Orthopedics;  Laterality: Left;    SOCIAL HISTORY: Social History   Socioeconomic History   Marital status: Married    Spouse name: Not on file   Number of children: Not on file   Years of education: Not on file   Highest education level: Not on file  Occupational History   Not on file  Tobacco Use  Smoking status: Never   Smokeless tobacco: Never  Vaping Use   Vaping Use: Never used  Substance and Sexual Activity   Alcohol use: No   Drug use: No   Sexual activity: Yes  Other Topics Concern   Not on file  Social History Narrative   Not on file   Social Determinants of Health   Financial Resource Strain: Not on file  Food Insecurity: Not on file  Transportation Needs: Not on file  Physical Activity: Not on file  Stress: Not on file  Social Connections: Not on file  Intimate Partner Violence: Not on file    FAMILY HISTORY: Family History  Problem Relation Age of Onset   Bone cancer Father    Hypertension Mother    Osteoporosis  Mother    Breast cancer Sister    Cancer Brother    Cancer Brother    Lung cancer Brother    Bladder Cancer Brother    Melanoma Brother     ALLERGIES:  is allergic to oxycodone-acetaminophen and voltaren [diclofenac sodium].  MEDICATIONS:  Current Outpatient Medications  Medication Sig Dispense Refill   acetaminophen (TYLENOL) 500 MG tablet Take 1,000 mg by mouth every 8 (eight) hours as needed for moderate pain.     albuterol (VENTOLIN HFA) 108 (90 Base) MCG/ACT inhaler Inhale 2 puffs into the lungs every 6 (six) hours as needed for wheezing or shortness of breath. 8 g 0   ascorbic acid (VITAMIN C) 1000 MG tablet Take 1,000 mg by mouth daily.     b complex vitamins capsule Take 1 capsule by mouth daily.     calcium carbonate (OSCAL) 1500 (600 Ca) MG TABS tablet Take 1,500 mg by mouth daily with breakfast.     Cholecalciferol 25 MCG (1000 UT) tablet Take 1,000 Units by mouth daily.     donepezil (ARICEPT) 10 MG tablet Take 10 mg by mouth at bedtime.     DULoxetine (CYMBALTA) 20 MG capsule Take 20 mg by mouth daily.     famotidine (PEPCID) 40 MG tablet Take 40 mg by mouth 2 (two) times daily.     ferrous sulfate 325 (65 FE) MG EC tablet Take 325 mg by mouth 3 (three) times daily with meals.     Flaxseed, Linseed, (FLAX SEED OIL) 1000 MG CAPS Take 1,000 mg by mouth daily.     furosemide (LASIX) 20 MG tablet Take 20 mg by mouth daily.     gabapentin (NEURONTIN) 100 MG capsule Take 100 mg by mouth 3 (three) times daily.      Garlic 8889 MG CAPS Take 1 capsule by mouth every morning.     Ginkgo Biloba (GINKOBA PO) Take 2 tablets by mouth daily.     latanoprost (XALATAN) 0.005 % ophthalmic solution Place 1 drop into both eyes at bedtime.     loratadine (CLARITIN REDITABS) 10 MG dissolvable tablet Take 10 mg by mouth daily. In am     Misc Natural Products (BLACK CHERRY CONCENTRATE) LIQD Take 15 mLs by mouth daily.      MISC NATURAL PRODUCTS PO Take 2 capsules by mouth daily. Beet juice  capsules     Omega-3 Fatty Acids (FISH OIL) 1000 MG CAPS Take 1 capsule by mouth daily.     OVER THE COUNTER MEDICATION Take 1 capsule by mouth daily. Neuriva     OVER THE COUNTER MEDICATION Take 1 capsule by mouth daily. Ageless Brain     OVER THE COUNTER MEDICATION Take 1,000 mg by mouth  daily. BACOPA     pantoprazole (PROTONIX) 40 MG tablet Take 40 mg by mouth 2 (two) times daily.     primidone (MYSOLINE) 50 MG tablet Take 50 mg by mouth 4 (four) times daily.     propranolol (INDERAL) 10 MG tablet Take 10 mg by mouth 2 (two) times daily.     vitamin E 180 MG (400 UNITS) capsule Take 400 Units by mouth daily.     zinc gluconate 50 MG tablet Take 50 mg by mouth daily.     levothyroxine (SYNTHROID) 50 MCG tablet Take 50 mcg by mouth daily before breakfast. Take on an empty stomach with a glass of water at least 30-60 minutes before breakfast.     No current facility-administered medications for this visit.      Marland Kitchen  PHYSICAL EXAMINATION: ECOG PERFORMANCE STATUS: 1 - Symptomatic but completely ambulatory Vitals:   08/02/21 1148  BP: (!) 143/72  Pulse: (!) 52  Temp: (!) 97.1 F (36.2 C)   Filed Weights   08/02/21 1148  Weight: 170 lb (77.1 kg)    Physical Exam Constitutional:      General: He is not in acute distress.    Appearance: He is not diaphoretic.     Comments: Patient ambulates independently  HENT:     Head: Normocephalic and atraumatic.     Nose: Nose normal.     Mouth/Throat:     Pharynx: No oropharyngeal exudate.  Eyes:     General: No scleral icterus.    Pupils: Pupils are equal, round, and reactive to light.  Cardiovascular:     Rate and Rhythm: Normal rate.     Heart sounds: No murmur heard. Pulmonary:     Effort: Pulmonary effort is normal. No respiratory distress.     Breath sounds: No rales.  Chest:     Chest wall: No tenderness.  Abdominal:     General: There is no distension.     Palpations: Abdomen is soft.     Tenderness: There is no abdominal  tenderness.  Musculoskeletal:        General: Normal range of motion.     Cervical back: Normal range of motion and neck supple.     Comments: Status post right knee replacement  Skin:    General: Skin is warm and dry.  Neurological:     Mental Status: He is alert and oriented to person, place, and time.  Psychiatric:        Mood and Affect: Affect normal.   .    LABORATORY DATA:  I have reviewed the data as listed    Latest Ref Rng & Units 07/25/2021   11:02 AM 04/18/2021   10:35 AM 03/13/2021   10:23 AM  CBC  WBC 4.0 - 10.5 K/uL 15.9  12.9  17.2   Hemoglobin 13.0 - 17.0 g/dL 12.7  12.8  12.5   Hematocrit 39.0 - 52.0 % 38.8  38.9  38.3   Platelets 150 - 400 K/uL 109  148  136       Latest Ref Rng & Units 07/25/2021   11:02 AM 04/18/2021   10:35 AM 03/13/2021   10:23 AM  CMP  Glucose 70 - 99 mg/dL 116  167  113   BUN 8 - 23 mg/dL _0 Creatinine 0.61 - 1.24 mg/dL 1.24  1.22  1.10   Sodium 135 - 145 mmol/L 140  138  135   Potassium 3.5 - 5.1  mmol/L 4.3  4.1  4.1   Chloride 98 - 111 mmol/L 103  100  101   CO2 22 - 32 mmol/L _0 Calcium 8.9 - 10.3 mg/dL 9.2  9.6  8.8   Total Protein 6.5 - 8.1 g/dL 7.1  7.0    Total Bilirubin 0.3 - 1.2 mg/dL 1.1  0.4    Alkaline Phos 38 - 126 U/L 65  77    AST 15 - 41 U/L 24  24    ALT 0 - 44 U/L 16  18      Chronic lymphocytic leukemia, B cell, CD38 positive (78%).  Cytogenetics revealed Trisomy 12 IGVH unmutated.  RADIOGRAPHIC STUDIES: I have personally reviewed the radiological images as listed and agreed with the findings in the report. no recent images. 11/04/2017  US abdomen Stable right renal cyst. Status post cholecystectomy.Previously seen lesion within the liver is not well appreciated on this exam. No Splenomegaly.

## 2021-11-01 ENCOUNTER — Inpatient Hospital Stay (HOSPITAL_BASED_OUTPATIENT_CLINIC_OR_DEPARTMENT_OTHER): Payer: Medicare Other | Admitting: Oncology

## 2021-11-01 ENCOUNTER — Encounter: Payer: Self-pay | Admitting: Oncology

## 2021-11-01 ENCOUNTER — Inpatient Hospital Stay: Payer: Medicare Other | Attending: Oncology

## 2021-11-01 VITALS — BP 121/65 | HR 50 | Temp 95.2°F | Resp 18 | Ht 69.0 in | Wt 164.0 lb

## 2021-11-01 DIAGNOSIS — D472 Monoclonal gammopathy: Secondary | ICD-10-CM | POA: Insufficient documentation

## 2021-11-01 DIAGNOSIS — C9110 Chronic lymphocytic leukemia of B-cell type not having achieved remission: Secondary | ICD-10-CM | POA: Diagnosis not present

## 2021-11-01 DIAGNOSIS — Z79899 Other long term (current) drug therapy: Secondary | ICD-10-CM | POA: Insufficient documentation

## 2021-11-01 DIAGNOSIS — D696 Thrombocytopenia, unspecified: Secondary | ICD-10-CM | POA: Insufficient documentation

## 2021-11-01 DIAGNOSIS — N1831 Chronic kidney disease, stage 3a: Secondary | ICD-10-CM | POA: Diagnosis not present

## 2021-11-01 LAB — CBC WITH DIFFERENTIAL/PLATELET
Abs Immature Granulocytes: 0.08 10*3/uL — ABNORMAL HIGH (ref 0.00–0.07)
Basophils Absolute: 0.1 10*3/uL (ref 0.0–0.1)
Basophils Relative: 0 %
Eosinophils Absolute: 0.4 10*3/uL (ref 0.0–0.5)
Eosinophils Relative: 2 %
HCT: 39 % (ref 39.0–52.0)
Hemoglobin: 13.1 g/dL (ref 13.0–17.0)
Immature Granulocytes: 0 %
Lymphocytes Relative: 76 %
Lymphs Abs: 19.1 10*3/uL — ABNORMAL HIGH (ref 0.7–4.0)
MCH: 36.4 pg — ABNORMAL HIGH (ref 26.0–34.0)
MCHC: 33.6 g/dL (ref 30.0–36.0)
MCV: 108.3 fL — ABNORMAL HIGH (ref 80.0–100.0)
Monocytes Absolute: 1.9 10*3/uL — ABNORMAL HIGH (ref 0.1–1.0)
Monocytes Relative: 8 %
Neutro Abs: 3.4 10*3/uL (ref 1.7–7.7)
Neutrophils Relative %: 14 %
Platelets: 114 10*3/uL — ABNORMAL LOW (ref 150–400)
RBC: 3.6 MIL/uL — ABNORMAL LOW (ref 4.22–5.81)
RDW: 12.8 % (ref 11.5–15.5)
Smear Review: NORMAL
WBC: 24.9 10*3/uL — ABNORMAL HIGH (ref 4.0–10.5)
nRBC: 0 % (ref 0.0–0.2)

## 2021-11-01 LAB — COMPREHENSIVE METABOLIC PANEL
ALT: 19 U/L (ref 0–44)
AST: 21 U/L (ref 15–41)
Albumin: 4 g/dL (ref 3.5–5.0)
Alkaline Phosphatase: 67 U/L (ref 38–126)
Anion gap: 7 (ref 5–15)
BUN: 35 mg/dL — ABNORMAL HIGH (ref 8–23)
CO2: 29 mmol/L (ref 22–32)
Calcium: 9.6 mg/dL (ref 8.9–10.3)
Chloride: 103 mmol/L (ref 98–111)
Creatinine, Ser: 1.27 mg/dL — ABNORMAL HIGH (ref 0.61–1.24)
GFR, Estimated: 55 mL/min — ABNORMAL LOW (ref >60)
Glucose, Bld: 106 mg/dL — ABNORMAL HIGH (ref 70–99)
Potassium: 4.4 mmol/L (ref 3.5–5.1)
Sodium: 139 mmol/L (ref 135–145)
Total Bilirubin: 0.6 mg/dL (ref 0.3–1.2)
Total Protein: 7 g/dL (ref 6.5–8.1)

## 2021-11-01 LAB — LACTATE DEHYDROGENASE: LDH: 98 U/L (ref 98–192)

## 2021-11-01 NOTE — Progress Notes (Unsigned)
Low HR today, voices no concerns

## 2021-11-02 ENCOUNTER — Encounter: Payer: Self-pay | Admitting: Oncology

## 2021-11-02 NOTE — Assessment & Plan Note (Signed)
avoid nephrotoxins. Encourage oral hydration 

## 2021-11-02 NOTE — Assessment & Plan Note (Signed)
Stable counts. monitor 

## 2021-11-02 NOTE — Assessment & Plan Note (Signed)
Labs are reviewed and discussed with patient. Stable M protein and normal light chain ration.  Continue observation. Repeat labs every 6 months.  

## 2021-11-02 NOTE — Progress Notes (Signed)
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 IgVH unmutated. Stage III, extensive bone marrow involvement. Currently off acalabrutinib 100 mg twice daily Previous peripheral blood flow cytometry showed CLL population, 58% of total cells. Labs are reviewed and discussed with patient. He has lost some weight, otherwise feeling well. Leukocytosis is worse Continue watchful waiting. We discussed that he likely will need to resume CLL treatment in the near future. Close monitor.   Thrombocytopenia (HCC) Stable counts. monitor  MGUS (monoclonal gammopathy of unknown significance) Labs are reviewed and discussed with patient. Stable M protein and normal light chain ration.  Continue observation. Repeat labs every 6 months.   Chronic kidney disease (CKD) stage G3a/A1, moderately decreased glomerular filtration rate (GFR) between 45-59 mL/min/1.73 square meter and albuminuria creatinine ratio less than 30 mg/g (HCC) avoid nephrotoxins.  Encourage oral hydration.  Orders Placed This Encounter  Procedures   CBC with Differential/Platelet    Standing Status:   Future    Standing Expiration Date:   11/02/2022   Comprehensive metabolic panel    Standing Status:   Future    Standing Expiration Date:   11/01/2022   Lactate dehydrogenase    Standing Status:   Future    Standing Expiration Date:   11/02/2022   Follow up in 2 months.  All questions were answered. The patient knows to call the clinic with any problems, questions or concerns.  [name], MD, PhD [facility] Hematology Oncology 11/01/2021   REASON FOR VISIT  follow up for CLL and iron deficiency anemia, MGUS  HISTORY OF PRESENTING ILLNESS:  Allen Chang 88 y.o.   male with PMH listed as below who was referred by primary care provider to me for evaluation of leukocytosis. He has a history of prostate cancer which was diagnosed 10 years ago. He underwent brachytherapy. He is not any castration treatment. Per patient, he follows up with Dr.Wolf and most recent PSA is normal.   Patient also reports feeling fatigue lately. Recent labs show leukocytosis, mild anemia, macrocytosis, mild thrombocytopenia, low ferritin. Denies blood in the stool. He was started on over the counter iron supplement but wife who manages patient's medication decides to only let patient to take iron medication every other day.   # received IV iron Venofer x 4. Colonoscopy was done on 02/16/2017 which showed polyps, internal hemorrhoids, negative for malignancy.  ##Patient had bone marrow biopsy on 09/22/2018 Pathology showed hypercellular bone marrow with extensive involvement by chronic lymphocytic leukemia. # Started on acalabrutinib 100 mg twice daily on 10/01/2018. 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  #August 2020-acalabrutinib 100 mg twice daily Stopped in October 2021 during his admission.  # admitted from 12/16/19- 10/30/ 2021 due to sudden onset chest pain. Troponins were negative in the emergency room.  Initial EKG showed atrial fibrillation with heart rate of 55. Patient was seen by cardiology and clarified that the EKG and telemetry actually showed sinus rhythm with PACs patient did not require any anticoagulation.Echocardiogram showed normal EF and no wall motion abnormality.  Nuclear stress testing showed low risk study, EF 63%. Patient was discharged home and followed up with Dr. Clayborn Bigness on January 26, 2020 Currently off of acalabrutinib since October 2021 admission.  INTERVAL HISTORY Allen A  Chang is a 88 y.o. male who has above history reviewed by me presents for 4 months follow up for CLL and iron deficiency anemia, MGUS Today patient was  accompanied by his wife. 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  Good appetite. Has lost 6 pounds since his visit 3 months ago. No night sweating, fever,.    Review of Systems  Constitutional:  Positive for unexpected weight change. Negative for appetite change, chills, fatigue and fever.  HENT:   Negative for hearing loss and voice change.   Eyes:  Negative for eye problems and icterus.  Respiratory:  Negative for chest tightness, cough and shortness of breath.   Cardiovascular:  Negative for chest pain and leg swelling.  Gastrointestinal:  Negative for abdominal distention and abdominal pain.  Endocrine: Negative for hot flashes.  Genitourinary:  Negative for difficulty urinating, dysuria and frequency.   Musculoskeletal:  Positive for arthralgias. Negative for back pain.  Skin:  Negative for itching and rash.       Skin cancer  Neurological:  Negative for light-headedness and numbness.  Hematological:  Negative for adenopathy. Does not bruise/bleed easily.  Psychiatric/Behavioral:  Negative for confusion.     MEDICAL HISTORY:  Past Medical History:  Diagnosis Date   Cancer [facility])    prostate    CKD (chronic kidney disease) stage 3, GFR 30-59 ml/min (HCC) 4/53/6468   Complication of anesthesia    bradycardia, in ICU for 24 hour after galbladder surgery.    Diverticulosis 2012   Dysrhythmia    Heart skips a beat   Elevated lipids    GERD (gastroesophageal reflux disease)    History of hiatal hernia    Hypertension    Hypothyroidism    Leukemia (HCC)    Prostate cancer ([place])    Skin cancer    face, scalp, behind ear,back and hand   Tremors of nervous system     SURGICAL HISTORY: Past Surgical History:  Procedure Laterality Date   BLADDER TUMOR EXCISION     CARDIAC CATHETERIZATION     CHOLECYSTECTOMY N/A 10/10/2014   Procedure: LAPAROSCOPIC CHOLECYSTECTOMY WITH INTRAOPERATIVE CHOLANGIOGRAM;  Surgeon: Leonie Green, MD;  Location: [facility] ORS;  Service: General;   Laterality: N/A;   COLONOSCOPY WITH PROPOFOL N/A 02/16/2017   Procedure: COLONOSCOPY WITH PROPOFOL;  Surgeon: Manya Silvas, MD;  Location: [facility] ENDOSCOPY;  Service: Endoscopy;  Laterality: N/A;   ESOPHAGOGASTRODUODENOSCOPY (EGD) WITH PROPOFOL N/A 02/16/2017   Procedure: ESOPHAGOGASTRODUODENOSCOPY (EGD) WITH PROPOFOL;  Surgeon: Manya Silvas, MD;  Location: [facility] ENDOSCOPY;  Service: Endoscopy;  Laterality: N/A;   HEMORRHOID SURGERY     HERNIA REPAIR Left    x2   JOINT REPLACEMENT Left    Partial Knee Replacement, Dr. Roland Rack   PARTIAL KNEE ARTHROPLASTY Left 12/12/2014   Procedure: UNICOMPARTMENTAL KNEE;  Surgeon: Corky Mull, MD;  Location: [facility] ORS;  Service: Orthopedics;  Laterality: Left;   PARTIAL KNEE ARTHROPLASTY Right 09/25/2020   Procedure: RIGHT PARTIAL KNEE REPLACEMENT;  Surgeon: Corky Mull, MD;  Location: [facility] ORS;  Service: Orthopedics;  Laterality: Right;   prostate seeding     SHOULDER ARTHROSCOPY Right    SHOULDER ARTHROSCOPY WITH OPEN ROTATOR CUFF REPAIR Left 02/07/2016   Procedure: SHOULDER ARTHROSCOPY WITH OPEN ROTATOR CUFF REPAIR;  Surgeon: Corky Mull, MD;  Location: [facility] ORS;  Service: Orthopedics;  Laterality: Left;    SOCIAL HISTORY: Social History   Socioeconomic History   Marital status: Married    Spouse name: Not on file  Number of children: Not on file   Years of education: Not on file   Highest education level: Not on file  Occupational History   Not on file  Tobacco Use   Smoking status: Never   Smokeless tobacco: Never  Vaping Use   Vaping Use: Never used  Substance and Sexual Activity   Alcohol use: No   Drug use: No   Sexual activity: Yes  Other Topics Concern   Not on file  Social History Narrative   Not on file   Social Determinants of Health   Financial Resource Strain: Not on file  Food Insecurity: Not on file  Transportation Needs: Not on file  Physical Activity: Not on file  Stress: Not on file  Social Connections:  Not on file  Intimate Partner Violence: Not on file    FAMILY HISTORY: Family History  Problem Relation Age of Onset   Bone cancer Father    Hypertension Mother    Osteoporosis Mother    Breast cancer Sister    Cancer Brother    Cancer Brother    Lung cancer Brother    Bladder Cancer Brother    Melanoma Brother     ALLERGIES:  is allergic to oxycodone-acetaminophen and voltaren [diclofenac sodium].  MEDICATIONS:  Current Outpatient Medications  Medication Sig Dispense Refill   acetaminophen (TYLENOL) 500 MG tablet Take 1,000 mg by mouth every 8 (eight) hours as needed for moderate pain.     albuterol (VENTOLIN HFA) 108 (90 Base) MCG/ACT inhaler Inhale 2 puffs into the lungs every 6 (six) hours as needed for wheezing or shortness of breath. 8 g 0   ascorbic acid (VITAMIN C) 1000 MG tablet Take 1,000 mg by mouth daily.     b complex vitamins capsule Take 1 capsule by mouth daily.     calcium carbonate (OSCAL) 1500 (600 Ca) MG TABS tablet Take 1,500 mg by mouth daily with breakfast.     Cholecalciferol 25 MCG (1000 UT) tablet Take 1,000 Units by mouth daily.     donepezil (ARICEPT) 10 MG tablet Take 10 mg by mouth at bedtime.     DULoxetine (CYMBALTA) 20 MG capsule Take 20 mg by mouth daily.     famotidine (PEPCID) 40 MG tablet Take 40 mg by mouth 2 (two) times daily.     ferrous sulfate 325 (65 FE) MG EC tablet Take 325 mg by mouth 3 (three) times daily with meals.     Flaxseed, Linseed, (FLAX SEED OIL) 1000 MG CAPS Take 1,000 mg by mouth daily.     furosemide (LASIX) 20 MG tablet Take 20 mg by mouth daily.     gabapentin (NEURONTIN) 100 MG capsule Take 100 mg by mouth 3 (three) times daily.      gabapentin (NEURONTIN) 100 MG capsule Take 1 capsule by mouth at bedtime.     Garlic 5093 MG CAPS Take 1 capsule by mouth every morning.     Ginkgo Biloba (GINKOBA PO) Take 2 tablets by mouth daily.     latanoprost (XALATAN) 0.005 % ophthalmic solution Place 1 drop into both eyes at  bedtime.     loratadine (CLARITIN REDITABS) 10 MG dissolvable tablet Take 10 mg by mouth daily. In am     Misc Natural Products (BLACK CHERRY CONCENTRATE) LIQD Take 15 mLs by mouth daily.      MISC NATURAL PRODUCTS PO Take 2 capsules by mouth daily. Beet juice capsules     Omega-3 Fatty Acids (FISH OIL) 1000 MG  CAPS Take 1 capsule by mouth daily.     OVER THE COUNTER MEDICATION Take 1 capsule by mouth daily. Neuriva     OVER THE COUNTER MEDICATION Take 1 capsule by mouth daily. Ageless Brain     OVER THE COUNTER MEDICATION Take 1,000 mg by mouth daily. BACOPA     pantoprazole (PROTONIX) 40 MG tablet Take 40 mg by mouth 2 (two) times daily.     primidone (MYSOLINE) 50 MG tablet Take 50 mg by mouth 4 (four) times daily.     propranolol (INDERAL) 10 MG tablet Take 10 mg by mouth 2 (two) times daily.     vitamin E 180 MG (400 UNITS) capsule Take 400 Units by mouth daily.     zinc gluconate 50 MG tablet Take 50 mg by mouth daily.     levothyroxine (SYNTHROID) 50 MCG tablet Take 50 mcg by mouth daily before breakfast. Take on an empty stomach with a glass of water at least 30-60 minutes before breakfast.     No current facility-administered medications for this visit.      Marland Kitchen  PHYSICAL EXAMINATION: ECOG PERFORMANCE STATUS: 1 - Symptomatic but completely ambulatory Vitals:   11/01/21 1214  BP: 121/65  Pulse: (!) 50  Resp: 18  Temp: (!) 95.2 F (35.1 C)  SpO2: 100%   Filed Weights   11/01/21 1214  Weight: 164 lb (74.4 kg)    Physical Exam Constitutional:      General: He is not in acute distress.    Appearance: He is not diaphoretic.     Comments: Patient ambulates independently  HENT:     Head: Normocephalic and atraumatic.     Nose: Nose normal.     Mouth/Throat:     Pharynx: No oropharyngeal exudate.  Eyes:     General: No scleral icterus.    Pupils: Pupils are equal, round, and reactive to light.  Cardiovascular:     Rate and Rhythm: Normal rate.     Heart sounds: No  murmur heard. Pulmonary:     Effort: Pulmonary effort is normal. No respiratory distress.     Breath sounds: No rales.  Chest:     Chest wall: No tenderness.  Abdominal:     General: There is no distension.     Palpations: Abdomen is soft.     Tenderness: There is no abdominal tenderness.  Musculoskeletal:        General: Normal range of motion.     Cervical back: Normal range of motion and neck supple.     Comments: Status post right knee replacement  Skin:    General: Skin is warm and dry.  Neurological:     Mental Status: He is alert and oriented to person, place, and time.  Psychiatric:        Mood and Affect: Affect normal.   .    LABORATORY DATA:  I have reviewed the data as listed    Latest Ref Rng & Units 11/01/2021   11:39 AM 07/25/2021   11:02 AM 04/18/2021   10:35 AM  CBC  WBC 4.0 - 10.5 K/uL 24.9  15.9  12.9   Hemoglobin 13.0 - 17.0 g/dL 13.1  12.7  12.8   Hematocrit 39.0 - 52.0 % 39.0  38.8  38.9   Platelets 150 - 400 K/uL 114  109  148       Latest Ref Rng & Units 11/01/2021   11:39 AM 07/25/2021   11:02 AM 04/18/2021   10:35  AM  CMP  Glucose 70 - 99 mg/dL 106  116  167   BUN 8 - 23 mg/dL 35  29  19   Creatinine 0.61 - 1.24 mg/dL 1.27  1.24  1.22   Sodium 135 - 145 mmol/L 139  140  138   Potassium 3.5 - 5.1 mmol/L 4.4  4.3  4.1   Chloride 98 - 111 mmol/L 103  103  100   CO2 22 - 32 mmol/L _0 Calcium 8.9 - 10.3 mg/dL 9.6  9.2  9.6   Total Protein 6.5 - 8.1 g/dL 7.0  7.1  7.0   Total Bilirubin 0.3 - 1.2 mg/dL 0.6  1.1  0.4   Alkaline Phos 38 - 126 U/L 67  65  77   AST 15 - 41 U/L _1 ALT 0 - 44 U/L _2 Chronic lymphocytic leukemia, B cell, CD38 positive (78%).  Cytogenetics revealed Trisomy 12 IGVH unmutated.  RADIOGRAPHIC STUDIES: I have personally reviewed the radiological images as listed and agreed with the findings in the report. no recent images. 11/04/2017  US abdomen Stable right renal cyst. Status post  cholecystectomy.Previously seen lesion within the liver is not well appreciated on this exam. No Splenomegaly.

## 2021-11-02 NOTE — Assessment & Plan Note (Addendum)
CLL, intermediate risk with trisomy 10. IgVH unmutated. Stage III, extensive bone marrow involvement. Currently off acalabrutinib 100 mg twice daily Previous peripheral blood flow cytometry showed CLL population, 58% of total cells. Labs are reviewed and discussed with patient. He has lost some weight, otherwise feeling well. Leukocytosis is worse Continue watchful waiting. We discussed that he likely will need to resume CLL treatment in the near future. Close monitor.

## 2022-01-01 ENCOUNTER — Inpatient Hospital Stay: Payer: Medicare Other | Attending: Oncology

## 2022-01-01 ENCOUNTER — Encounter: Payer: Self-pay | Admitting: Oncology

## 2022-01-01 ENCOUNTER — Inpatient Hospital Stay (HOSPITAL_BASED_OUTPATIENT_CLINIC_OR_DEPARTMENT_OTHER): Payer: Medicare Other | Admitting: Oncology

## 2022-01-01 VITALS — BP 125/85 | HR 70 | Temp 98.4°F | Resp 18 | Wt 164.3 lb

## 2022-01-01 DIAGNOSIS — Z8262 Family history of osteoporosis: Secondary | ICD-10-CM | POA: Insufficient documentation

## 2022-01-01 DIAGNOSIS — N1831 Chronic kidney disease, stage 3a: Secondary | ICD-10-CM | POA: Diagnosis not present

## 2022-01-01 DIAGNOSIS — M2550 Pain in unspecified joint: Secondary | ICD-10-CM | POA: Insufficient documentation

## 2022-01-01 DIAGNOSIS — Z9049 Acquired absence of other specified parts of digestive tract: Secondary | ICD-10-CM | POA: Diagnosis not present

## 2022-01-01 DIAGNOSIS — C9110 Chronic lymphocytic leukemia of B-cell type not having achieved remission: Secondary | ICD-10-CM | POA: Insufficient documentation

## 2022-01-01 DIAGNOSIS — Z8546 Personal history of malignant neoplasm of prostate: Secondary | ICD-10-CM | POA: Insufficient documentation

## 2022-01-01 DIAGNOSIS — Z808 Family history of malignant neoplasm of other organs or systems: Secondary | ICD-10-CM | POA: Diagnosis not present

## 2022-01-01 DIAGNOSIS — R61 Generalized hyperhidrosis: Secondary | ICD-10-CM | POA: Diagnosis not present

## 2022-01-01 DIAGNOSIS — D509 Iron deficiency anemia, unspecified: Secondary | ICD-10-CM | POA: Diagnosis not present

## 2022-01-01 DIAGNOSIS — Z809 Family history of malignant neoplasm, unspecified: Secondary | ICD-10-CM | POA: Diagnosis not present

## 2022-01-01 DIAGNOSIS — I4891 Unspecified atrial fibrillation: Secondary | ICD-10-CM | POA: Diagnosis not present

## 2022-01-01 DIAGNOSIS — Z8719 Personal history of other diseases of the digestive system: Secondary | ICD-10-CM | POA: Diagnosis not present

## 2022-01-01 DIAGNOSIS — D696 Thrombocytopenia, unspecified: Secondary | ICD-10-CM | POA: Insufficient documentation

## 2022-01-01 DIAGNOSIS — D472 Monoclonal gammopathy: Secondary | ICD-10-CM | POA: Diagnosis not present

## 2022-01-01 DIAGNOSIS — Z8249 Family history of ischemic heart disease and other diseases of the circulatory system: Secondary | ICD-10-CM | POA: Diagnosis not present

## 2022-01-01 DIAGNOSIS — Z801 Family history of malignant neoplasm of trachea, bronchus and lung: Secondary | ICD-10-CM | POA: Diagnosis not present

## 2022-01-01 DIAGNOSIS — Z8052 Family history of malignant neoplasm of bladder: Secondary | ICD-10-CM | POA: Insufficient documentation

## 2022-01-01 DIAGNOSIS — K648 Other hemorrhoids: Secondary | ICD-10-CM | POA: Insufficient documentation

## 2022-01-01 DIAGNOSIS — R5383 Other fatigue: Secondary | ICD-10-CM | POA: Diagnosis not present

## 2022-01-01 DIAGNOSIS — Z85828 Personal history of other malignant neoplasm of skin: Secondary | ICD-10-CM | POA: Insufficient documentation

## 2022-01-01 DIAGNOSIS — Z79899 Other long term (current) drug therapy: Secondary | ICD-10-CM | POA: Diagnosis not present

## 2022-01-01 DIAGNOSIS — Z803 Family history of malignant neoplasm of breast: Secondary | ICD-10-CM | POA: Insufficient documentation

## 2022-01-01 LAB — COMPREHENSIVE METABOLIC PANEL
ALT: 40 U/L (ref 0–44)
AST: 27 U/L (ref 15–41)
Albumin: 3.6 g/dL (ref 3.5–5.0)
Alkaline Phosphatase: 82 U/L (ref 38–126)
Anion gap: 8 (ref 5–15)
BUN: 47 mg/dL — ABNORMAL HIGH (ref 8–23)
CO2: 27 mmol/L (ref 22–32)
Calcium: 9.7 mg/dL (ref 8.9–10.3)
Chloride: 101 mmol/L (ref 98–111)
Creatinine, Ser: 0.94 mg/dL (ref 0.61–1.24)
GFR, Estimated: >60 mL/min (ref >60)
Glucose, Bld: 107 mg/dL — ABNORMAL HIGH (ref 70–99)
Potassium: 4 mmol/L (ref 3.5–5.1)
Sodium: 136 mmol/L (ref 135–145)
Total Bilirubin: 0.6 mg/dL (ref 0.3–1.2)
Total Protein: 7.1 g/dL (ref 6.5–8.1)

## 2022-01-01 LAB — CBC WITH DIFFERENTIAL/PLATELET
Abs Immature Granulocytes: 0.24 10*3/uL — ABNORMAL HIGH (ref 0.00–0.07)
Basophils Absolute: 0.1 10*3/uL (ref 0.0–0.1)
Basophils Relative: 0 %
Eosinophils Absolute: 0.4 10*3/uL (ref 0.0–0.5)
Eosinophils Relative: 1 %
HCT: 41.4 % (ref 39.0–52.0)
Hemoglobin: 13.5 g/dL (ref 13.0–17.0)
Immature Granulocytes: 1 %
Lymphocytes Relative: 77 %
Lymphs Abs: 30.5 10*3/uL — ABNORMAL HIGH (ref 0.7–4.0)
MCH: 35.5 pg — ABNORMAL HIGH (ref 26.0–34.0)
MCHC: 32.6 g/dL (ref 30.0–36.0)
MCV: 108.9 fL — ABNORMAL HIGH (ref 80.0–100.0)
Monocytes Absolute: 3.5 10*3/uL — ABNORMAL HIGH (ref 0.1–1.0)
Monocytes Relative: 9 %
Neutro Abs: 4.6 10*3/uL (ref 1.7–7.7)
Neutrophils Relative %: 12 %
Platelets: 151 10*3/uL (ref 150–400)
RBC: 3.8 MIL/uL — ABNORMAL LOW (ref 4.22–5.81)
RDW: 13.5 % (ref 11.5–15.5)
Smear Review: NORMAL
WBC: 39.4 10*3/uL — ABNORMAL HIGH (ref 4.0–10.5)
nRBC: 0 % (ref 0.0–0.2)

## 2022-01-01 LAB — LACTATE DEHYDROGENASE: LDH: 115 U/L (ref 98–192)

## 2022-01-01 MED ORDER — ACALABRUTINIB 100 MG PO CAPS: 100.0000 mg | ORAL_CAPSULE | Freq: Two times a day (BID) | ORAL | 1 refills | Status: DC

## 2022-01-01 NOTE — Assessment & Plan Note (Signed)
CLL, intermediate risk with trisomy 32. IgVH unmutated. Stage III, extensive bone marrow involvement. Currently off acalabrutinib 100 mg twice daily Previous peripheral blood flow cytometry showed CLL population, 58% of total cells. Labs are reviewed and discussed with patient. Leukocytosis is worse, also developed night sweats.  Recommend resuming  acalabrutinib 100 mg twice daily

## 2022-01-01 NOTE — Assessment & Plan Note (Signed)
Labs are reviewed and discussed with patient. Stable M protein and normal light chain ration.  Continue observation. Repeat labs every 6 months.  

## 2022-01-01 NOTE — Addendum Note (Signed)
Addended by: [name] on: 01/01/2022 10:51 PM   Modules accepted: Orders

## 2022-01-01 NOTE — Assessment & Plan Note (Signed)
Stable counts. monitor 

## 2022-01-01 NOTE — Progress Notes (Signed)
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 IgVH unmutated. Stage III, extensive bone marrow involvement. Currently off acalabrutinib 100 mg twice daily Previous peripheral blood flow cytometry showed CLL population, 58% of total cells. Labs are reviewed and discussed with patient. Leukocytosis is worse, also developed night sweats.  Recommend resuming  acalabrutinib 100 mg twice daily  Thrombocytopenia (HCC) Stable counts. monitor  MGUS (monoclonal gammopathy of unknown significance) Labs are reviewed and discussed with patient. Stable M protein and normal light chain ration.  Continue observation. Repeat labs every 6 months.  Orders Placed This Encounter  Procedures   CBC with Differential/Platelet    Standing Status:   Future    Standing Expiration Date:   01/02/2023   Comprehensive metabolic panel    Standing Status:   Future    Standing Expiration Date:   01/01/2023   Follow up in 1 month All questions were answered. The patient knows to call the clinic with any problems, questions or concerns.  [name], MD, PhD [facility] Hematology Oncology 01/01/2022   REASON FOR VISIT  follow up for CLL and iron deficiency anemia, MGUS  HISTORY OF PRESENTING ILLNESS:  Allen Chang 88 y.o.  male with PMH listed as below who was referred by primary care provider to me for evaluation of leukocytosis. He has a history of prostate cancer which was diagnosed 10 years ago. He underwent brachytherapy. He is not any castration treatment. Per patient, he follows up with Dr.Wolf and most recent PSA is normal.   Patient also reports feeling fatigue lately. Recent labs show leukocytosis, mild anemia, macrocytosis, mild  thrombocytopenia, low ferritin. Denies blood in the stool. He was started on over the counter iron supplement but wife who manages patient's medication decides to only let patient to take iron medication every other day.   # received IV iron Venofer x 4. Colonoscopy was done on 02/16/2017 which showed polyps, internal hemorrhoids, negative for malignancy.  ##Patient had bone marrow biopsy on 09/22/2018 Pathology showed hypercellular bone marrow with extensive involvement by chronic lymphocytic leukemia. # Started on acalabrutinib 100 mg twice daily on 10/01/2018. 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  #August 2020-acalabrutinib 100 mg twice daily Stopped in October 2021 during his admission.  # admitted from 12/16/19- 10/30/ 2021 due to sudden onset chest pain. Troponins were negative in the emergency room.  Initial EKG showed atrial fibrillation with heart rate of 55. Patient was seen by cardiology and clarified that the EKG and telemetry actually showed sinus rhythm with PACs patient did not require any anticoagulation.Echocardiogram showed normal EF and no wall motion abnormality.  Nuclear stress testing showed low risk study, EF 63%. Patient was discharged home and followed up with Dr. Clayborn Bigness on January 26, 2020 Currently off of acalabrutinib since October 2021 admission.  INTERVAL HISTORY Allen Chang is a 88 y.o. male who has above history reviewed by me presents for 4 months follow up for CLL and iron deficiency anemia, MGUS Today patient was accompanied by his wife. 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  Good appetite. + he has developed night sweats daily.    Review of Systems  Constitutional:  Positive for unexpected weight change. Negative for appetite change, chills, fatigue and fever.  HENT:   Negative for hearing loss and voice change.   Eyes:  Negative for eye problems and icterus.  Respiratory:  Negative for chest tightness, cough and shortness of  breath.   Cardiovascular:  Negative for chest pain and leg swelling.  Gastrointestinal:  Negative for abdominal distention and abdominal pain.  Endocrine: Negative for hot flashes.  Genitourinary:  Negative for difficulty urinating, dysuria and frequency.   Musculoskeletal:  Positive for arthralgias. Negative for back pain.  Skin:  Negative for itching and rash.       Skin cancer  Neurological:  Negative for light-headedness and numbness.  Hematological:  Negative for adenopathy. Does not bruise/bleed easily.  Psychiatric/Behavioral:  Negative for confusion.     MEDICAL HISTORY:  Past Medical History:  Diagnosis Date   Cancer [facility])    prostate    CKD (chronic kidney disease) stage 3, GFR 30-59 ml/min (HCC) 0/93/2355   Complication of anesthesia    bradycardia, in ICU for 24 hour after galbladder surgery.    Diverticulosis 2012   Dysrhythmia    Heart skips a beat   Elevated lipids    GERD (gastroesophageal reflux disease)    History of hiatal hernia    Hypertension    Hypothyroidism    Leukemia (HCC)    Prostate cancer ([place])    Skin cancer    face, scalp, behind ear,back and hand   Tremors of nervous system     SURGICAL HISTORY: Past Surgical History:  Procedure Laterality Date   BLADDER TUMOR EXCISION     CARDIAC CATHETERIZATION     CHOLECYSTECTOMY N/A 10/10/2014   Procedure: LAPAROSCOPIC CHOLECYSTECTOMY WITH INTRAOPERATIVE CHOLANGIOGRAM;  Surgeon: Leonie Green, MD;  Location: [facility] ORS;  Service: General;  Laterality: N/A;   COLONOSCOPY WITH PROPOFOL N/A 02/16/2017   Procedure: COLONOSCOPY WITH PROPOFOL;  Surgeon: Manya Silvas, MD;  Location: [facility] ENDOSCOPY;  Service: Endoscopy;  Laterality: N/A;   ESOPHAGOGASTRODUODENOSCOPY (EGD) WITH PROPOFOL N/A 02/16/2017   Procedure: ESOPHAGOGASTRODUODENOSCOPY (EGD) WITH PROPOFOL;  Surgeon: Manya Silvas, MD;  Location[facility] ENDOSCOPY;  Service: Endoscopy;  Laterality: N/A;   HEMORRHOID SURGERY     HERNIA REPAIR  Left    x2   JOINT REPLACEMENT Left    Partial Knee Replacement, Dr. Roland Rack   PARTIAL KNEE ARTHROPLASTY Left 12/12/2014   Procedure: UNICOMPARTMENTAL KNEE;  Surgeon: Corky Mull, MD;  Location: [facility] ORS;  Service: Orthopedics;  Laterality: Left;   PARTIAL KNEE ARTHROPLASTY Right 09/25/2020   Procedure: RIGHT PARTIAL KNEE REPLACEMENT;  Surgeon: Corky Mull, MD;  Location: [facility] ORS;  Service: Orthopedics;  Laterality: Right;   prostate seeding     SHOULDER ARTHROSCOPY Right    SHOULDER ARTHROSCOPY WITH OPEN ROTATOR CUFF REPAIR Left 02/07/2016   Procedure: SHOULDER ARTHROSCOPY WITH OPEN ROTATOR CUFF REPAIR;  Surgeon: Corky Mull, MD;  Location: [facility] ORS;  Service: Orthopedics;  Laterality: Left;    SOCIAL HISTORY: Social History   Socioeconomic History   Marital status: Married    Spouse name: Not on file   Number of children: Not on file   Years of education: Not on file   Highest education level: Not on file  Occupational History   Not on file  Tobacco Use   Smoking status: Never   Smokeless tobacco: Never  Vaping Use   Vaping Use: Never used  Substance and Sexual Activity   Alcohol use: No   Drug use: No   Sexual activity: Yes  Other Topics Concern  Not on file  Social History Narrative   Not on file   Social Determinants of Health   Financial Resource Strain: Not on file  Food Insecurity: Not on file  Transportation Needs: Not on file  Physical Activity: Not on file  Stress: Not on file  Social Connections: Not on file  Intimate Partner Violence: Not on file    FAMILY HISTORY: Family History  Problem Relation Age of Onset   Bone cancer Father    Hypertension Mother    Osteoporosis Mother    Breast cancer Sister    Cancer Brother    Cancer Brother    Lung cancer Brother    Bladder Cancer Brother    Melanoma Brother     ALLERGIES:  is allergic to oxycodone-acetaminophen and voltaren [diclofenac sodium].  MEDICATIONS:  Current Outpatient Medications   Medication Sig Dispense Refill   acetaminophen (TYLENOL) 500 MG tablet Take 1,000 mg by mouth every 8 (eight) hours as needed for moderate pain.     ascorbic acid (VITAMIN C) 1000 MG tablet Take 1,000 mg by mouth daily.     b complex vitamins capsule Take 1 capsule by mouth daily.     calcium carbonate (OSCAL) 1500 (600 Ca) MG TABS tablet Take 1,500 mg by mouth daily with breakfast.     Cholecalciferol 25 MCG (1000 UT) tablet Take 1,000 Units by mouth daily.     cyanocobalamin 100 MCG tablet Take 100 mcg by mouth daily.     donepezil (ARICEPT) 10 MG tablet Take 10 mg by mouth at bedtime.     DULoxetine (CYMBALTA) 20 MG capsule Take 20 mg by mouth daily.     famotidine (PEPCID) 40 MG tablet Take 40 mg by mouth 2 (two) times daily.     ferrous sulfate 325 (65 FE) MG EC tablet Take 325 mg by mouth 3 (three) times daily with meals.     Flaxseed, Linseed, (FLAX SEED OIL) 1000 MG CAPS Take 1,000 mg by mouth daily.     furosemide (LASIX) 20 MG tablet Take 20 mg by mouth daily.     gabapentin (NEURONTIN) 100 MG capsule Take 1 capsule by mouth at bedtime.     Garlic 6378 MG CAPS Take 1 capsule by mouth every morning.     Ginkgo Biloba (GINKOBA PO) Take 2 tablets by mouth daily.     latanoprost (XALATAN) 0.005 % ophthalmic solution Place 1 drop into both eyes at bedtime.     levothyroxine (SYNTHROID) 50 MCG tablet Take 50 mcg by mouth daily before breakfast. Take on an empty stomach with a glass of water at least 30-60 minutes before breakfast.     loratadine (CLARITIN REDITABS) 10 MG dissolvable tablet Take 10 mg by mouth daily. In am     Misc Natural Products (BLACK CHERRY CONCENTRATE) LIQD Take 15 mLs by mouth daily.      MISC NATURAL PRODUCTS PO Take 2 capsules by mouth daily. Beet juice capsules     Omega-3 Fatty Acids (FISH OIL) 1000 MG CAPS Take 1 capsule by mouth daily.     OVER THE COUNTER MEDICATION Take 1 capsule by mouth daily. Neuriva     OVER THE COUNTER MEDICATION Take 1 capsule by  mouth daily. Ageless Brain     OVER THE COUNTER MEDICATION Take 1,000 mg by mouth daily. BACOPA     pantoprazole (PROTONIX) 40 MG tablet Take 40 mg by mouth 2 (two) times daily.     primidone (MYSOLINE) 50 MG tablet Take  50 mg by mouth 4 (four) times daily.     propranolol (INDERAL) 10 MG tablet Take 10 mg by mouth 2 (two) times daily.     vitamin E 180 MG (400 UNITS) capsule Take 400 Units by mouth daily.     zinc gluconate 50 MG tablet Take 50 mg by mouth daily.     albuterol (VENTOLIN HFA) 108 (90 Base) MCG/ACT inhaler Inhale 2 puffs into the lungs every 6 (six) hours as needed for wheezing or shortness of breath. (Patient not taking: Reported on 01/01/2022) 8 g 0   No current facility-administered medications for this visit.      Marland Kitchen  PHYSICAL EXAMINATION: ECOG PERFORMANCE STATUS: 1 - Symptomatic but completely ambulatory Vitals:   01/01/22 1258  BP: 125/85  Pulse: 70  Resp: 18  Temp: 98.4 F (36.9 C)   Filed Weights   01/01/22 1258  Weight: 164 lb 4.8 oz (74.5 kg)    Physical Exam Constitutional:      General: He is not in acute distress.    Appearance: He is not diaphoretic.     Comments: Patient ambulates independently  HENT:     Head: Normocephalic and atraumatic.     Nose: Nose normal.     Mouth/Throat:     Pharynx: No oropharyngeal exudate.  Eyes:     General: No scleral icterus.    Pupils: Pupils are equal, round, and reactive to light.  Cardiovascular:     Rate and Rhythm: Normal rate.     Heart sounds: No murmur heard. Pulmonary:     Effort: Pulmonary effort is normal. No respiratory distress.     Breath sounds: No rales.  Chest:     Chest wall: No tenderness.  Abdominal:     General: There is no distension.     Palpations: Abdomen is soft.     Tenderness: There is no abdominal tenderness.  Musculoskeletal:        General: Normal range of motion.     Cervical back: Normal range of motion and neck supple.     Comments: Status post right knee  replacement  Skin:    General: Skin is warm and dry.  Neurological:     Mental Status: He is alert and oriented to person, place, and time.  Psychiatric:        Mood and Affect: Affect normal.   .    LABORATORY DATA:  I have reviewed the data as listed    Latest Ref Rng & Units 01/01/2022   12:24 PM 11/01/2021   11:39 AM 07/25/2021   11:02 AM  CBC  WBC 4.0 - 10.5 K/uL 39.4  24.9  15.9   Hemoglobin 13.0 - 17.0 g/dL 13.5  13.1  12.7   Hematocrit 39.0 - 52.0 % 41.4  39.0  38.8   Platelets 150 - 400 K/uL 151  114  109       Latest Ref Rng & Units 01/01/2022   12:24 PM 11/01/2021   11:39 AM 07/25/2021   11:02 AM  CMP  Glucose 70 - 99 mg/dL 107  106  116   BUN 8 - 23 mg/dL 47  35  29   Creatinine 0.61 - 1.24 mg/dL 0.94  1.27  1.24   Sodium 135 - 145 mmol/L 136  139  140   Potassium 3.5 - 5.1 mmol/L 4.0  4.4  4.3   Chloride 98 - 111 mmol/L 101  103  103   CO2 22 - 32  mmol/L _0 Calcium 8.9 - 10.3 mg/dL 9.7  9.6  9.2   Total Protein 6.5 - 8.1 g/dL 7.1  7.0  7.1   Total Bilirubin 0.3 - 1.2 mg/dL 0.6  0.6  1.1   Alkaline Phos 38 - 126 U/L 82  67  65   AST 15 - 41 U/L _1 ALT 0 - 44 U/L 40  19  16     Chronic lymphocytic leukemia, B cell, CD38 positive (78%).  Cytogenetics revealed Trisomy 12 IGVH unmutated.  RADIOGRAPHIC STUDIES: I have personally reviewed the radiological images as listed and agreed with the findings in the report. no recent images. 11/04/2017  US abdomen Stable right renal cyst. Status post cholecystectomy.Previously seen lesion within the liver is not well appreciated on this exam. No Splenomegaly.

## 2022-01-02 ENCOUNTER — Telehealth: Payer: Self-pay

## 2022-01-02 ENCOUNTER — Encounter: Payer: Self-pay | Admitting: Oncology

## 2022-01-02 ENCOUNTER — Other Ambulatory Visit (HOSPITAL_COMMUNITY): Payer: Self-pay

## 2022-01-02 ENCOUNTER — Telehealth: Payer: Self-pay | Admitting: Pharmacist

## 2022-01-02 DIAGNOSIS — C9110 Chronic lymphocytic leukemia of B-cell type not having achieved remission: Secondary | ICD-10-CM

## 2022-01-02 MED ORDER — ACALABRUTINIB MALEATE 100 MG PO TABS: 100.0000 mg | ORAL_TABLET | Freq: Two times a day (BID) | ORAL | 1 refills | Status: DC

## 2022-01-02 MED ORDER — ACALABRUTINIB MALEATE 100 MG PO TABS
100.0000 mg | ORAL_TABLET | Freq: Two times a day (BID) | ORAL | 1 refills | Status: DC
  Filled 2022-01-02: qty 60, 30d supply, fill #0

## 2022-01-02 NOTE — Telephone Encounter (Signed)
Oral Oncology Patient Advocate Encounter   Received notification that assistance for Calquence has been approved through [employer].  Patient is enrolled from 01/02/2022 through 02/17/2023  This will allow the patient to receive their medication for $0.  [employer] phone number [phone].   Berdine Addison, [facility] Patient [place]  [phone] (phone) [phone] (fax) 01/02/2022 1:38 PM

## 2022-01-02 NOTE — Telephone Encounter (Signed)
Oral Oncology Patient Advocate Encounter  After completing a benefits investigation, prior authorization for Calquence is not required at this time through [place].  Patient's copay is $3,273.22.     Berdine Addison, [facility] [facility] Patient [place]  [phone] (phone) [phone] (fax) 01/02/2022 8:57 AM

## 2022-01-02 NOTE — Telephone Encounter (Signed)
Oral Oncology Patient Advocate Encounter   Began application for assistance for Calquence through [employer].   Application will be submitted upon completion of necessary supporting documentation.   [employer]'s phone number [phone].   I will continue to check the status until final determination.   Berdine Addison, [place] Patient [place]  [phone] (phone) [phone] (fax) 01/02/2022 9:26 AM

## 2022-01-02 NOTE — Telephone Encounter (Signed)
Oral Oncology Pharmacy Student Encounter  Received new prescription for Calquence (acalabrutinib) for the treatment of CLL (chronic lymphocytic leukemia), planned duration until disease progression or unacceptable toxicity.  CBC, CMP from 01/01/22 assessed, with no relevant drug abnormalities. Prescription dose and frequency assessed.   Current medication list in Epic reviewed, few DDIs with Calquence identified: Primidone: Category D interaction, primidone may decrease the concentration of Calquence. If the combination can't be avoided the manufacturer recommends increasing the dose to '200mg'$  twice daily. Allen Chang is on a very low dose of primidone. I would suggest starting him on normal dosing '100mg'$  twice daily, see how his disease responds, then increase if it seems like he is not getting enough response from the acalabrutinib.   Patient has been on this combination in the past and was able to achieve response on both medications at the normal dose of Calquence 100 mg BID.   Evaluated chart and no patient barriers to medication adherence identified.   Prescription has been e-scribed to the [facility] for benefits analysis and approval.  Oral Oncology Clinic will continue to follow for insurance authorization, copayment issues, initial counseling and start date.  Patient agreed to treatment on 01/01/22 per MD documentation.  Allen Chang, PharmD Candidate [place]/DB/AP Oral Chemotherapy Navigation [facility] [phone]  01/02/2022 9:46 AM

## 2022-01-06 ENCOUNTER — Encounter: Payer: Self-pay | Admitting: Oncology

## 2022-01-06 ENCOUNTER — Other Ambulatory Visit (HOSPITAL_COMMUNITY): Payer: Self-pay

## 2022-01-07 NOTE — Telephone Encounter (Signed)
Spoke to Tazlina (Mr. Yarde's daughter). Looks like she will be the main contact for Mr. Holdsworth. I gave her [employer]'s phone number to follow up and confirmed with them that [place] is in processing to be shipped.   Berdine Addison, [place] [facility] Patient [place]  [phone] (phone) [phone] (fax) 01/07/2022 12:29 PM

## 2022-01-13 ENCOUNTER — Encounter: Payer: Self-pay | Admitting: Ophthalmology

## 2022-01-15 NOTE — Discharge Instructions (Signed)

## 2022-01-16 ENCOUNTER — Ambulatory Visit: Payer: Medicare Other | Admitting: Anesthesiology

## 2022-01-16 ENCOUNTER — Ambulatory Visit
Admission: RE | Admit: 2022-01-16 | Discharge: 2022-01-16 | Disposition: A | Discharge status: Home / Self Care | Payer: Medicare Other | Source: Ambulatory Visit | Attending: Ophthalmology | Admitting: Ophthalmology

## 2022-01-16 ENCOUNTER — Other Ambulatory Visit: Payer: Self-pay

## 2022-01-16 ENCOUNTER — Encounter
Admission: RE | Disposition: A | Discharge status: Home / Self Care | Payer: Self-pay | Source: Ambulatory Visit | Attending: Ophthalmology

## 2022-01-16 ENCOUNTER — Encounter: Payer: Self-pay | Admitting: Ophthalmology

## 2022-01-16 DIAGNOSIS — I129 Hypertensive chronic kidney disease with stage 1 through stage 4 chronic kidney disease, or unspecified chronic kidney disease: Secondary | ICD-10-CM | POA: Diagnosis not present

## 2022-01-16 DIAGNOSIS — H2511 Age-related nuclear cataract, right eye: Secondary | ICD-10-CM | POA: Insufficient documentation

## 2022-01-16 DIAGNOSIS — Z8679 Personal history of other diseases of the circulatory system: Secondary | ICD-10-CM | POA: Diagnosis not present

## 2022-01-16 DIAGNOSIS — E039 Hypothyroidism, unspecified: Secondary | ICD-10-CM | POA: Insufficient documentation

## 2022-01-16 DIAGNOSIS — K219 Gastro-esophageal reflux disease without esophagitis: Secondary | ICD-10-CM | POA: Insufficient documentation

## 2022-01-16 DIAGNOSIS — H401110 Primary open-angle glaucoma, right eye, stage unspecified: Secondary | ICD-10-CM | POA: Diagnosis not present

## 2022-01-16 DIAGNOSIS — N183 Chronic kidney disease, stage 3 unspecified: Secondary | ICD-10-CM | POA: Diagnosis not present

## 2022-01-16 HISTORY — PX: CATARACT EXTRACTION W/PHACO: SHX586

## 2022-01-16 SURGERY — PHACOEMULSIFICATION, CATARACT, WITH IOL INSERTION
Anesthesia: Monitor Anesthesia Care | Site: Eye | Laterality: Right

## 2022-01-16 MED ORDER — SIGHTPATH DOSE#1 BSS IO SOLN
INTRAOCULAR | Status: DC | PRN
  Administered 2022-01-16: 15 mL via INTRAOCULAR

## 2022-01-16 MED ORDER — SIGHTPATH DOSE#1 NA HYALUR & NA CHOND-NA HYALUR IO KIT
PACK | INTRAOCULAR | Status: DC | PRN
  Administered 2022-01-16: 1 via OPHTHALMIC

## 2022-01-16 MED ORDER — TETRACAINE HCL 0.5 % OP SOLN
1.0000 [drp] | OPHTHALMIC | Status: DC | PRN
  Administered 2022-01-16 (×2): 1 [drp] via OPHTHALMIC

## 2022-01-16 MED ORDER — SIGHTPATH DOSE#1 BSS IO SOLN
INTRAOCULAR | Status: DC | PRN
  Administered 2022-01-16: 68 mL via OPHTHALMIC

## 2022-01-16 MED ORDER — SIGHTPATH DOSE#1 BSS IO SOLN
INTRAOCULAR | Status: DC | PRN
  Administered 2022-01-16: 2 mL

## 2022-01-16 MED ORDER — ARMC OPHTHALMIC DILATING DROPS
1.0000 | OPHTHALMIC | Status: DC | PRN
  Administered 2022-01-16 (×2): 1 via OPHTHALMIC

## 2022-01-16 MED ORDER — LACTATED RINGERS IV SOLN: INTRAVENOUS | Status: DC

## 2022-01-16 MED ORDER — CEFUROXIME OPHTHALMIC INJECTION 1 MG/0.1 ML
INJECTION | OPHTHALMIC | Status: DC | PRN
  Administered 2022-01-16: .1 mL via INTRACAMERAL

## 2022-01-16 MED ORDER — FENTANYL CITRATE (PF) 100 MCG/2ML IJ SOLN
INTRAMUSCULAR | Status: DC | PRN
  Administered 2022-01-16: 25 ug via INTRAVENOUS

## 2022-01-16 SURGICAL SUPPLY — 22 items
BLADE DUAL KAHOOK SINGLE USE (BLADE) IMPLANT
CANNULA ANT/CHMB 27G (MISCELLANEOUS) IMPLANT
CANNULA ANT/CHMB 27GA (MISCELLANEOUS) IMPLANT
CATARACT SUITE SIGHTPATH (MISCELLANEOUS) ×1 IMPLANT
FEE CATARACT SUITE SIGHTPATH (MISCELLANEOUS) ×1 IMPLANT
GLOVE SRG 8 PF TXTR STRL LF DI (GLOVE) ×1 IMPLANT
GLOVE SURG ENC TEXT LTX SZ7.5 (GLOVE) ×1 IMPLANT
GLOVE SURG GAMMEX PI TX LF 7.5 (GLOVE) IMPLANT
GLOVE SURG UNDER POLY LF SZ8 (GLOVE) ×1
ICLIP (OPHTHALMIC RELATED) IMPLANT
LENS IOL TECNIS EYHANCE 22.0 (Intraocular Lens) IMPLANT
NDL FILTER BLUNT 18X1 1/2 (NEEDLE) ×1 IMPLANT
NDL RETROBULBAR .5 NSTRL (NEEDLE) IMPLANT
NEEDLE FILTER BLUNT 18X1 1/2 (NEEDLE) ×1 IMPLANT
PACK VIT ANT 23G (MISCELLANEOUS) IMPLANT
RING MALYGIN 7.0 (MISCELLANEOUS) IMPLANT
SUT ETHILON 10-0 CS-B-6CS-B-6 (SUTURE)
SUT VICRYL  9 0 (SUTURE)
SUT VICRYL 9 0 (SUTURE) IMPLANT
SUTURE EHLN 10-0 CS-B-6CS-B-6 (SUTURE) IMPLANT
SYR 3ML LL SCALE MARK (SYRINGE) ×1 IMPLANT
WATER STERILE IRR 250ML POUR (IV SOLUTION) ×1 IMPLANT

## 2022-01-16 NOTE — Anesthesia Preprocedure Evaluation (Addendum)
Anesthesia Evaluation  Patient identified by MRN, date of birth, ID band Patient awake    Reviewed: Allergy & Precautions, NPO status , Patient's Chart, lab work & pertinent test results, reviewed documented beta blocker date and time   History of Anesthesia Complications Negative for: history of anesthetic complications  Airway Mallampati: III  TM Distance: >3 FB     Dental  (+) Poor Dentition   Pulmonary neg COPD          Cardiovascular hypertension, Pt. on medications + dysrhythmias      Neuro/Psych  PSYCHIATRIC DISORDERS     Dementia    GI/Hepatic hiatal hernia,GERD  ,,  Endo/Other  Hypothyroidism    Renal/GU CRFRenal disease     Musculoskeletal   Abdominal   Peds  Hematology  (+) Blood dyscrasia, anemia   Anesthesia Other Findings   Reproductive/Obstetrics                             Anesthesia Physical Anesthesia Plan  ASA: 3  Anesthesia Plan: MAC   Post-op Pain Management:    Induction: Intravenous  PONV Risk Score and Plan: 1 and TIVA and Midazolam  Airway Management Planned: Natural Airway and Nasal Cannula  Additional Equipment:   Intra-op Plan:   Post-operative Plan:   Informed Consent: I have reviewed the patients History and Physical, chart, labs and discussed the procedure including the risks, benefits and alternatives for the proposed anesthesia with the patient or authorized representative who has indicated his/her understanding and acceptance.     Dental Advisory Given  Plan Discussed with: CRNA  Anesthesia Plan Comments: (Patient consented for risks of anesthesia including but not limited to:  - adverse reactions to medications - damage to eyes, teeth, lips or other oral mucosa - nerve damage due to positioning  - sore throat or hoarseness - Damage to heart, brain, nerves, lungs, other parts of body or loss of life  Patient voiced understanding.)         Anesthesia Quick Evaluation

## 2022-01-16 NOTE — Op Note (Signed)
PREOPERATIVE DIAGNOSIS:  Nuclear sclerotic cataract  right eye. 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 Item Serial No. Manufacturer Lot No. LRB No. Used Action  LENS IOL TECNIS EYHANCE 22.0 - [id number] Intraocular Lens LENS IOL TECNIS EYHANCE 22.0 [id number] [employer]  Right 1 Implanted    SURGEON:  Wyonia Hough, MD   ANESTHESIA:  Topical with tetracaine drops augmented with 1% preservative-free intracameral lidocaine.    COMPLICATIONS:  None.   DESCRIPTION OF PROCEDURE:  The patient was identified in the holding room and transported to the operating room and placed in the supine position under the operating microscope.  The right eye was identified as the operative eye and it was prepped and draped in the usual sterile ophthalmic fashion.   A 1 millimeter clear-corneal paracentesis was made at the 12:00 position.  0.5 ml of preservative-free 1% lidocaine was injected into the anterior chamber.  The anterior chamber was filled with Viscoat viscoelastic.  A 2.4 millimeter keratome was used to make a near-clear corneal incision at the 9:00 position. The microscope was adjusted and a gonioprism was used to visulaize the trabecular meshwork.  The [facility] Dual Blade was advanced across the anterior chamber under viscoelastic.  The blade was used to mark the trabecular meshwork at the 1:30 position.  The blade was placed two clock hours clockwise  into the meshwork.  Proper postioning was confirmed.  The blade ws passed counterclockwise through the meshwork to excise approximately two to three clock-hours of trabecular meshwork. A Malyugin ring was placed to enlarge the pupil to 7 mm for removal of the cataract.  A curvilinear capsulorrhexis was made with a cystotome and capsulorrhexis forceps.  Balanced salt solution was used to hydrodissect and hydrodelineate the nucleus.   Phacoemulsification was then used in stop and chop fashion to remove the lens nucleus and epinucleus.  The remaining cortex was then removed using the irrigation and aspiration handpiece. Provisc was then placed into the capsular bag to distend it for lens placement.  A lens was then injected into the capsular bag.  The Malyugin ring was removed. The remaining viscoelastic was aspirated.   Wounds were hydrated with balanced salt solution.  The anterior chamber was inflated to a physiologic pressure with balanced salt solution.  No wound leaks were noted. Cefuroxime 0.1 ml of a '10mg'$ /ml solution was injected into the anterior chamber for a dose of 1 mg of intracameral antibiotic at the completion of the case.  The patient was taken to the recovery room in stable condition without complications of anesthesia or surgery.

## 2022-01-16 NOTE — Anesthesia Postprocedure Evaluation (Signed)
Anesthesia Post Note  Patient: Allen Chang  Procedure(s) Performed: CATARACT EXTRACTION PHACO AND INTRAOCULAR LENS PLACEMENT (IOC) RIGHT DIABETIC [place] DUAL BLADE GONIOTOMY 14.34 01:38.4 (Right: Eye)  Patient location during evaluation: PACU Anesthesia Type: MAC Level of consciousness: awake and alert Pain management: pain level controlled Vital Signs Assessment: post-procedure vital signs reviewed and stable Respiratory status: spontaneous breathing, nonlabored ventilation, respiratory function stable and patient connected to nasal cannula oxygen Cardiovascular status: stable and blood pressure returned to baseline Postop Assessment: no apparent nausea or vomiting Anesthetic complications: no  No notable events documented.   Last Vitals:  Vitals:   01/16/22 1209 01/16/22 1218  BP: 128/66 132/64  Pulse: (!) 56 (!) 55  Resp: (!) 22 19  Temp: (!) 36.4 C   SpO2: 95% 98%    Last Pain:  Vitals:   01/16/22 1218  TempSrc:   PainSc: 0-No pain                 Dimas Millin

## 2022-01-16 NOTE — Transfer of Care (Signed)
Immediate Anesthesia Transfer of Care Note  Patient: Stevon A Tidd  Procedure(s) Performed: CATARACT EXTRACTION PHACO AND INTRAOCULAR LENS PLACEMENT (IOC) RIGHT DIABETIC [place] DUAL BLADE GONIOTOMY 14.34 01:38.4 (Right: Eye)  Patient Location: PACU  Anesthesia Type: MAC  Level of Consciousness: awake, alert  and patient cooperative  Airway and Oxygen Therapy: Patient Spontanous Breathing and Patient connected to supplemental oxygen  Post-op Assessment: Post-op Vital signs reviewed, Patient's Cardiovascular Status Stable, Respiratory Function Stable, Patent Airway and No signs of Nausea or vomiting  Post-op Vital Signs: Reviewed and stable  Complications: No notable events documented.

## 2022-01-16 NOTE — H&P (Signed)
[place]   Primary Care Physician:  Tracie Harrier, MD Ophthalmologist: Dr. Leandrew Koyanagi  Pre-Procedure History & Physical: HPI:  Allen Chang is a 88 y.o. male here for ophthalmic surgery.   Past Medical History:  Diagnosis Date   Cancer ([place])    prostate    CKD (chronic kidney disease) stage 3, GFR 30-59 ml/min (HCC) 06/04/4079   Complication of anesthesia    bradycardia, in ICU for 24 hour after galbladder surgery.    Diverticulosis 2012   Dysrhythmia    Heart skips a beat   Elevated lipids    GERD (gastroesophageal reflux disease)    History of hiatal hernia    Hypertension    Hypothyroidism    Leukemia (HCC)    Prostate cancer ([place])    Skin cancer    face, scalp, behind ear,back and hand   Tremors of nervous system     Past Surgical History:  Procedure Laterality Date   BLADDER TUMOR EXCISION     CARDIAC CATHETERIZATION     CHOLECYSTECTOMY N/A 10/10/2014   Procedure: LAPAROSCOPIC CHOLECYSTECTOMY WITH INTRAOPERATIVE CHOLANGIOGRAM;  Surgeon: Leonie Green, MD;  Location: [facility] ORS;  Service: General;  Laterality: N/A;   COLONOSCOPY WITH PROPOFOL N/A 02/16/2017   Procedure: COLONOSCOPY WITH PROPOFOL;  Surgeon: Manya Silvas, MD;  Location[facility] ENDOSCOPY;  Service: Endoscopy;  Laterality: N/A;   ESOPHAGOGASTRODUODENOSCOPY (EGD) WITH PROPOFOL N/A 02/16/2017   Procedure: ESOPHAGOGASTRODUODENOSCOPY (EGD) WITH PROPOFOL;  Surgeon: Manya Silvas, MD;  Location: [facility] ENDOSCOPY;  Service: Endoscopy;  Laterality: N/A;   HEMORRHOID SURGERY     HERNIA REPAIR Left    x2   JOINT REPLACEMENT Left    Partial Knee Replacement, Dr. Roland Rack   PARTIAL KNEE ARTHROPLASTY Left 12/12/2014   Procedure: UNICOMPARTMENTAL KNEE;  Surgeon: Corky Mull, MD;  Location: [facility] ORS;  Service: Orthopedics;  Laterality: Left;   PARTIAL KNEE ARTHROPLASTY Right 09/25/2020   Procedure: RIGHT PARTIAL KNEE REPLACEMENT;  Surgeon: Corky Mull, MD;  Location: [facility] ORS;   Service: Orthopedics;  Laterality: Right;   prostate seeding     SHOULDER ARTHROSCOPY Right    SHOULDER ARTHROSCOPY WITH OPEN ROTATOR CUFF REPAIR Left 02/07/2016   Procedure: SHOULDER ARTHROSCOPY WITH OPEN ROTATOR CUFF REPAIR;  Surgeon: Corky Mull, MD;  Location: [facility] ORS;  Service: Orthopedics;  Laterality: Left;    Prior to Admission medications   Medication Sig Start Date End Date Taking? Authorizing Provider  acalabrutinib maleate (CALQUENCE) 100 MG tablet Take 1 tablet (100 mg total) by mouth 2 (two) times daily. 01/02/22  Yes [name], MD  acetaminophen (TYLENOL) 500 MG tablet Take 1,000 mg by mouth every 8 (eight) hours as needed for moderate pain.   Yes [provider]  ascorbic acid (VITAMIN C) 1000 MG tablet Take 1,000 mg by mouth daily.   Yes [provider]  b complex vitamins capsule Take 1 capsule by mouth daily.   Yes [provider]  calcium carbonate (OSCAL) 1500 (600 Ca) MG TABS tablet Take 1,500 mg by mouth daily with breakfast.   Yes [provider]  cyanocobalamin 100 MCG tablet Take 100 mcg by mouth daily.   Yes [provider]  donepezil (ARICEPT) 10 MG tablet Take 10 mg by mouth at bedtime. 05/24/20  Yes [provider]  DULoxetine (CYMBALTA) 20 MG capsule Take 20 mg by mouth daily. 06/08/19  Yes [provider]  famotidine (PEPCID) 40 MG tablet Take 40 mg by mouth 2 (two)  times daily. 04/28/19  Yes [provider]  ferrous sulfate 325 (65 FE) MG EC tablet Take 325 mg by mouth 3 (three) times daily with meals.   Yes [provider]  Flaxseed, Linseed, (FLAX SEED OIL) 1000 MG CAPS Take 1,000 mg by mouth daily.   Yes [provider]  furosemide (LASIX) 20 MG tablet Take 20 mg by mouth daily. 07/10/21  Yes [provider]  gabapentin (NEURONTIN) 100 MG capsule Take 1 capsule by mouth at bedtime. 05/17/21 05/17/22 Yes [provider]  Garlic 0630 MG CAPS Take 1 capsule by  mouth every morning.   Yes [provider]  Ginkgo Biloba (GINKOBA PO) Take 2 tablets by mouth daily.   Yes [provider]  latanoprost (XALATAN) 0.005 % ophthalmic solution Place 1 drop into both eyes at bedtime.   Yes [provider]  levothyroxine (SYNTHROID) 50 MCG tablet Take 50 mcg by mouth daily before breakfast. Take on an empty stomach with a glass of water at least 30-60 minutes before breakfast. 04/17/20 01/16/22 Yes [provider]  loratadine (CLARITIN REDITABS) 10 MG dissolvable tablet Take 10 mg by mouth daily. In am   Yes [provider]  Misc Natural Products (BLACK CHERRY CONCENTRATE) LIQD Take 15 mLs by mouth daily.    Yes [provider]  MISC NATURAL PRODUCTS PO Take 2 capsules by mouth daily. Beet juice capsules   Yes [provider]  Omega-3 Fatty Acids (FISH OIL) 1000 MG CAPS Take 1 capsule by mouth daily.   Yes [provider]  OVER THE COUNTER MEDICATION Take 1 capsule by mouth daily. Neuriva   Yes [provider]  OVER THE COUNTER MEDICATION Take 1 capsule by mouth daily. Ageless Brain   Yes [provider]  OVER THE COUNTER MEDICATION Take 1,000 mg by mouth daily. BACOPA   Yes [provider]  pantoprazole (PROTONIX) 40 MG tablet Take 40 mg by mouth 2 (two) times daily. 03/14/21  Yes [provider]  primidone (MYSOLINE) 50 MG tablet Take 50 mg by mouth 4 (four) times daily.   Yes [provider]  propranolol (INDERAL) 10 MG tablet Take 10 mg by mouth 2 (two) times daily.   Yes [provider]  vitamin E 180 MG (400 UNITS) capsule Take 400 Units by mouth daily.   Yes [provider]  zinc gluconate 50 MG tablet Take 50 mg by mouth daily.   Yes [provider]  albuterol (VENTOLIN HFA) 108 (90 Base) MCG/ACT inhaler Inhale 2 puffs into the lungs every 6 (six) hours as needed for wheezing or shortness of breath. Patient not taking:  Reported on 01/01/2022 02/06/21   Loletha Grayer, MD  Cholecalciferol 25 MCG (1000 UT) tablet Take 1,000 Units by mouth daily. Patient not taking: Reported on 01/13/2022    [provider]    Allergies as of 12/13/2021 - Review Complete 11/01/2021  Allergen Reaction Noted   Oxycodone-acetaminophen Other (See Comments) 10/03/2014   Voltaren [diclofenac sodium] Palpitations 09/29/2014    Family History  Problem Relation Age of Onset   Bone cancer Father    Hypertension Mother    Osteoporosis Mother    Breast cancer Sister    Cancer Brother    Cancer Brother    Lung cancer Brother    Bladder Cancer Brother    Melanoma Brother     Social History   Socioeconomic History   Marital status: Married    Spouse name: Not on  file   Number of children: Not on file   Years of education: Not on file   Highest education level: Not on file  Occupational History   Not on file  Tobacco Use   Smoking status: Never   Smokeless tobacco: Never  Vaping Use   Vaping Use: Never used  Substance and Sexual Activity   Alcohol use: No   Drug use: No   Sexual activity: Yes  Other Topics Concern   Not on file  Social History Narrative   Not on file   Social Determinants of Health   Financial Resource Strain: Not on file  Food Insecurity: Not on file  Transportation Needs: Not on file  Physical Activity: Not on file  Stress: Not on file  Social Connections: Not on file  Intimate Partner Violence: Not on file    Review of Systems: See HPI, otherwise negative ROS  Physical Exam: BP (!) 142/69   Pulse (!) 54   Temp (!) 97.2 F (36.2 C) (Temporal)   Resp 18   Ht '5\' 9"'$  (1.753 m)   Wt 72.1 kg   SpO2 100%   BMI 23.48 kg/m  General:   Alert,  pleasant and cooperative in NAD Head:  Normocephalic and atraumatic. Lungs:  Clear to auscultation.    Heart:  Regular rate and rhythm.   Impression/Plan: Allen Chang is here for ophthalmic surgery.  Risks, benefits,  limitations, and alternatives regarding ophthalmic surgery have been reviewed with the patient.  Questions have been answered.  All parties agreeable.   Leandrew Koyanagi, MD  01/16/2022, 11:36 AM

## 2022-01-20 NOTE — Telephone Encounter (Signed)
Have not heard back from patient or daughter, called [facility] and they stated the medication was shipped to the patient on 01/07/22.

## 2022-01-23 ENCOUNTER — Encounter: Payer: Self-pay | Admitting: Ophthalmology

## 2022-01-27 NOTE — Discharge Instructions (Signed)

## 2022-01-28 ENCOUNTER — Inpatient Hospital Stay: Payer: Medicare Other | Attending: Oncology

## 2022-01-28 ENCOUNTER — Inpatient Hospital Stay (HOSPITAL_BASED_OUTPATIENT_CLINIC_OR_DEPARTMENT_OTHER): Payer: Medicare Other | Admitting: Oncology

## 2022-01-28 ENCOUNTER — Encounter: Payer: Self-pay | Admitting: Oncology

## 2022-01-28 VITALS — BP 164/64 | HR 56 | Temp 96.0°F | Wt 160.2 lb

## 2022-01-28 DIAGNOSIS — D472 Monoclonal gammopathy: Secondary | ICD-10-CM | POA: Diagnosis not present

## 2022-01-28 DIAGNOSIS — D509 Iron deficiency anemia, unspecified: Secondary | ICD-10-CM | POA: Insufficient documentation

## 2022-01-28 DIAGNOSIS — C9110 Chronic lymphocytic leukemia of B-cell type not having achieved remission: Secondary | ICD-10-CM | POA: Insufficient documentation

## 2022-01-28 DIAGNOSIS — D696 Thrombocytopenia, unspecified: Secondary | ICD-10-CM | POA: Diagnosis not present

## 2022-01-28 DIAGNOSIS — Z79899 Other long term (current) drug therapy: Secondary | ICD-10-CM | POA: Diagnosis not present

## 2022-01-28 LAB — COMPREHENSIVE METABOLIC PANEL
ALT: 22 U/L (ref 0–44)
AST: 20 U/L (ref 15–41)
Albumin: 4.2 g/dL (ref 3.5–5.0)
Alkaline Phosphatase: 67 U/L (ref 38–126)
Anion gap: 9 (ref 5–15)
BUN: 42 mg/dL — ABNORMAL HIGH (ref 8–23)
CO2: 28 mmol/L (ref 22–32)
Calcium: 9.6 mg/dL (ref 8.9–10.3)
Chloride: 105 mmol/L (ref 98–111)
Creatinine, Ser: 1.12 mg/dL (ref 0.61–1.24)
GFR, Estimated: >60 mL/min (ref >60)
Glucose, Bld: 79 mg/dL (ref 70–99)
Potassium: 4 mmol/L (ref 3.5–5.1)
Sodium: 142 mmol/L (ref 135–145)
Total Bilirubin: 0.6 mg/dL (ref 0.3–1.2)
Total Protein: 7.5 g/dL (ref 6.5–8.1)

## 2022-01-28 LAB — CBC WITH DIFFERENTIAL/PLATELET
Abs Immature Granulocytes: 0.13 10*3/uL — ABNORMAL HIGH (ref 0.00–0.07)
Basophils Absolute: 0 10*3/uL (ref 0.0–0.1)
Basophils Relative: 0 %
Eosinophils Absolute: 0.3 10*3/uL (ref 0.0–0.5)
Eosinophils Relative: 1 %
HCT: 41 % (ref 39.0–52.0)
Hemoglobin: 13 g/dL (ref 13.0–17.0)
Immature Granulocytes: 0 %
Lymphocytes Relative: 92 %
Lymphs Abs: 63 10*3/uL — ABNORMAL HIGH (ref 0.7–4.0)
MCH: 35 pg — ABNORMAL HIGH (ref 26.0–34.0)
MCHC: 31.7 g/dL (ref 30.0–36.0)
MCV: 110.5 fL — ABNORMAL HIGH (ref 80.0–100.0)
Monocytes Absolute: 0.8 10*3/uL (ref 0.1–1.0)
Monocytes Relative: 1 %
Neutro Abs: 4.2 10*3/uL (ref 1.7–7.7)
Neutrophils Relative %: 6 %
Platelets: 110 10*3/uL — ABNORMAL LOW (ref 150–400)
RBC: 3.71 MIL/uL — ABNORMAL LOW (ref 4.22–5.81)
RDW: 13.2 % (ref 11.5–15.5)
Smear Review: DECREASED
WBC Morphology: ABNORMAL
WBC: 68.5 10*3/uL (ref 4.0–10.5)
nRBC: 0 % (ref 0.0–0.2)

## 2022-01-28 NOTE — Assessment & Plan Note (Addendum)
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 Stage III, extensive bone marrow involvement. Currently off acalabrutinib 100 mg twice daily Previous peripheral blood flow cytometry showed CLL population, 58% of total cells. Labs are reviewed and discussed with patient. Leukocytosis is as expected- side effects from treatment, anticipate to decrease in the near future.  Continue acalabrutinib 100 mg twice daily

## 2022-01-28 NOTE — Assessment & Plan Note (Signed)
Labs are reviewed and discussed with patient. Stable M protein and normal light chain ration.  Continue observation. Repeat labs every 6 months.  

## 2022-01-28 NOTE — Progress Notes (Signed)
Hematology/Oncology Follow Up Note [phone]   CONSULT NOTE Patient Care Team: Tracie Harrier, MD as PCP - General (Internal Medicine) [name], MD as Consulting Physician (Oncology)   ASSESSMENT & PLAN:   CLL (chronic lymphocytic leukemia) ([place]) CLL, intermediate risk with trisomy 12. IgVH unmutated. Stage III, extensive bone marrow involvement. Currently off acalabrutinib 100 mg twice daily Previous peripheral blood flow cytometry showed CLL population, 58% of total cells. Labs are reviewed and discussed with patient. Leukocytosis is as expected- side effects from treatment, anticipate to decrease in the near future.  Continue acalabrutinib 100 mg twice daily  Thrombocytopenia (HCC) Stable counts. monitor  MGUS (monoclonal gammopathy of unknown significance) Labs are reviewed and discussed with patient. Stable M protein and normal light chain ration.  Continue observation. Repeat labs every 6 months.   Orders Placed This Encounter  Procedures   CBC with Differential/Platelet    Standing Status:   Future    Standing Expiration Date:   01/28/2023   Comprehensive metabolic panel    Standing Status:   Future    Standing Expiration Date:   01/28/2023   Follow up in 1 month All questions were answered. The patient knows to call the clinic with any problems, questions or concerns.  [name], MD, PhD [facility] Hematology Oncology 01/28/2022   REASON FOR VISIT  follow up for CLL and iron deficiency anemia, MGUS  HISTORY OF PRESENTING ILLNESS:  Allen Chang 88 y.o.  male with PMH listed as below who was referred by primary care provider to me for evaluation of leukocytosis. He has a history of prostate cancer which was diagnosed 10 years ago. He underwent brachytherapy. He is not any castration treatment. Per patient, he follows up with Dr.Wolf and most recent PSA is normal.   Patient also reports feeling fatigue lately. Recent labs show leukocytosis,  mild anemia, macrocytosis, mild thrombocytopenia, low ferritin. Denies blood in the stool. He was started on over the counter iron supplement but wife who manages patient's medication decides to only let patient to take iron medication every other day.   # received IV iron Venofer x 4. Colonoscopy was done on 02/16/2017 which showed polyps, internal hemorrhoids, negative for malignancy.  ##Patient had bone marrow biopsy on 09/22/2018 Pathology showed hypercellular bone marrow with extensive involvement by chronic lymphocytic leukemia. # Started on acalabrutinib 100 mg twice daily on 10/01/2018. 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  #August 2020-acalabrutinib 100 mg twice daily Stopped in October 2021 during his admission.  # admitted from 12/16/19- 10/30/ 2021 due to sudden onset chest pain. Troponins were negative in the emergency room.  Initial EKG showed atrial fibrillation with heart rate of 55. Patient was seen by cardiology and clarified that the EKG and telemetry actually showed sinus rhythm with PACs patient did not require any anticoagulation.Echocardiogram showed normal EF and no wall motion abnormality.  Nuclear stress testing showed low risk study, EF 63%. Patient was discharged home and followed up with Dr. Clayborn Bigness on January 26, 2020 Currently off of acalabrutinib since October 2021 admission.  INTERVAL HISTORY Allen Chang is a 88 y.o. male who has above history reviewed by me presents for 4 months follow up for CLL and iron deficiency anemia, MGUS Today patient was accompanied by his wife.  Appetite is fair.  He has lost 4 pounds since last visit. He has started on acalabrutinib since last visit. Recent cataract surgeries.   Review of Systems  Constitutional:  Positive for appetite change and unexpected weight change. Negative for chills, fatigue and fever.  HENT:   Negative for hearing loss and voice change.   Eyes:  Negative for eye problems and  icterus.  Respiratory:  Negative for chest tightness, cough and shortness of breath.   Cardiovascular:  Negative for chest pain and leg swelling.  Gastrointestinal:  Negative for abdominal distention and abdominal pain.  Endocrine: Negative for hot flashes.  Genitourinary:  Negative for difficulty urinating, dysuria and frequency.   Musculoskeletal:  Positive for arthralgias. Negative for back pain.  Skin:  Negative for itching and rash.       Skin cancer  Neurological:  Negative for light-headedness and numbness.  Hematological:  Negative for adenopathy. Does not bruise/bleed easily.  Psychiatric/Behavioral:  Negative for confusion.     MEDICAL HISTORY:  Past Medical History:  Diagnosis Date   Cancer [facility])    prostate    CKD (chronic kidney disease) stage 3, GFR 30-59 ml/min (HCC) 6/50/3546   Complication of anesthesia    bradycardia, in ICU for 24 hour after galbladder surgery.    Diverticulosis 2012   Dysrhythmia    Heart skips a beat   Elevated lipids    GERD (gastroesophageal reflux disease)    History of hiatal hernia    Hypertension    Hypothyroidism    Leukemia (HCC)    Prostate cancer (HCC)    Skin cancer    face, scalp, behind ear,back and hand   Tremors of nervous system     SURGICAL HISTORY: Past Surgical History:  Procedure Laterality Date   BLADDER TUMOR EXCISION     CARDIAC CATHETERIZATION     CATARACT EXTRACTION W/PHACO Right 01/16/2022   Procedure: CATARACT EXTRACTION PHACO AND INTRAOCULAR LENS PLACEMENT ([place]) RIGHT DIABETIC KAHOOK DUAL BLADE GONIOTOMY 14.34 01:38.4;  Surgeon: Leandrew Koyanagi, MD;  Location: [facility];  Service: Ophthalmology;  Laterality: Right;   CHOLECYSTECTOMY N/A 10/10/2014   Procedure: LAPAROSCOPIC CHOLECYSTECTOMY WITH INTRAOPERATIVE CHOLANGIOGRAM;  Surgeon: Leonie Green, MD;  Location: [facility] ORS;  Service: General;  Laterality: N/A;   COLONOSCOPY WITH PROPOFOL N/A 02/16/2017   Procedure: COLONOSCOPY WITH  PROPOFOL;  Surgeon: Manya Silvas, MD;  Location[facility] ENDOSCOPY;  Service: Endoscopy;  Laterality: N/A;   ESOPHAGOGASTRODUODENOSCOPY (EGD) WITH PROPOFOL N/A 02/16/2017   Procedure: ESOPHAGOGASTRODUODENOSCOPY (EGD) WITH PROPOFOL;  Surgeon: Manya Silvas, MD;  Location: [facility] ENDOSCOPY;  Service: Endoscopy;  Laterality: N/A;   HEMORRHOID SURGERY     HERNIA REPAIR Left    x2   JOINT REPLACEMENT Left    Partial Knee Replacement, Dr. Roland Rack   PARTIAL KNEE ARTHROPLASTY Left 12/12/2014   Procedure: UNICOMPARTMENTAL KNEE;  Surgeon: Corky Mull, MD;  Location: [facility] ORS;  Service: Orthopedics;  Laterality: Left;   PARTIAL KNEE ARTHROPLASTY Right 09/25/2020   Procedure: RIGHT PARTIAL KNEE REPLACEMENT;  Surgeon: Corky Mull, MD;  Location: [facility] ORS;  Service: Orthopedics;  Laterality: Right;   prostate seeding     SHOULDER ARTHROSCOPY Right    SHOULDER ARTHROSCOPY WITH OPEN ROTATOR CUFF REPAIR Left 02/07/2016   Procedure: SHOULDER ARTHROSCOPY WITH OPEN ROTATOR CUFF REPAIR;  Surgeon: Corky Mull, MD;  Location: [facility] ORS;  Service: Orthopedics;  Laterality: Left;    SOCIAL HISTORY: Social History   Socioeconomic History   Marital status: Married    Spouse name: Not on file   Number of children: Not on file   Years of education: Not on file   Highest education level: Not on  file  Occupational History   Not on file  Tobacco Use   Smoking status: Never   Smokeless tobacco: Never  Vaping Use   Vaping Use: Never used  Substance and Sexual Activity   Alcohol use: No   Drug use: No   Sexual activity: Yes  Other Topics Concern   Not on file  Social History Narrative   Not on file   Social Determinants of Health   Financial Resource Strain: Not on file  Food Insecurity: Not on file  Transportation Needs: Not on file  Physical Activity: Not on file  Stress: Not on file  Social Connections: Not on file  Intimate Partner Violence: Not on file    FAMILY HISTORY: Family History   Problem Relation Age of Onset   Bone cancer Father    Hypertension Mother    Osteoporosis Mother    Breast cancer Sister    Cancer Brother    Cancer Brother    Lung cancer Brother    Bladder Cancer Brother    Melanoma Brother     ALLERGIES:  is allergic to oxycodone-acetaminophen and voltaren [diclofenac sodium].  MEDICATIONS:  Current Outpatient Medications  Medication Sig Dispense Refill   acalabrutinib (CALQUENCE) 100 MG capsule Take by mouth.     acalabrutinib maleate (CALQUENCE) 100 MG tablet Take 1 tablet (100 mg total) by mouth 2 (two) times daily. 60 tablet 1   acetaminophen (TYLENOL) 500 MG tablet Take 1,000 mg by mouth every 8 (eight) hours as needed for moderate pain.     ascorbic acid (VITAMIN C) 1000 MG tablet Take 1,000 mg by mouth daily.     b complex vitamins capsule Take 1 capsule by mouth daily.     calcium carbonate (OSCAL) 1500 (600 Ca) MG TABS tablet Take 1,500 mg by mouth daily with breakfast.     cyanocobalamin 100 MCG tablet Take 100 mcg by mouth daily.     donepezil (ARICEPT) 10 MG tablet Take 10 mg by mouth at bedtime.     DULoxetine (CYMBALTA) 20 MG capsule Take 20 mg by mouth daily.     famotidine (PEPCID) 40 MG tablet Take 40 mg by mouth 2 (two) times daily.     ferrous sulfate 325 (65 FE) MG EC tablet Take 325 mg by mouth 3 (three) times daily with meals.     Flaxseed, Linseed, (FLAX SEED OIL) 1000 MG CAPS Take 1,000 mg by mouth daily.     fluorouracil (EFUDEX) 5 % cream Apply to the back of your right hand twice per day for 5 to 7 days. STOP when the area becomes red and irritated even if you haven't done the full 7 days.     furosemide (LASIX) 20 MG tablet Take 20 mg by mouth daily.     gabapentin (NEURONTIN) 100 MG capsule Take 1 capsule by mouth at bedtime.     Garlic 1443 MG CAPS Take 1 capsule by mouth every morning.     Ginkgo Biloba (GINKOBA PO) Take 2 tablets by mouth daily.     latanoprost (XALATAN) 0.005 % ophthalmic solution Place 1 drop  into both eyes at bedtime.     levothyroxine (SYNTHROID) 50 MCG tablet Take 50 mcg by mouth daily before breakfast. Take on an empty stomach with a glass of water at least 30-60 minutes before breakfast.     loratadine (CLARITIN REDITABS) 10 MG dissolvable tablet Take 10 mg by mouth daily. In am     Misc Natural Products (BLACK CHERRY  CONCENTRATE) LIQD Take 15 mLs by mouth daily.      MISC NATURAL PRODUCTS PO Take 2 capsules by mouth daily. Beet juice capsules     Omega-3 Fatty Acids (FISH OIL) 1000 MG CAPS Take 1 capsule by mouth daily.     OVER THE COUNTER MEDICATION Take 1 capsule by mouth daily. Neuriva     OVER THE COUNTER MEDICATION Take 1 capsule by mouth daily. Ageless Brain     OVER THE COUNTER MEDICATION Take 1,000 mg by mouth daily. BACOPA     pantoprazole (PROTONIX) 40 MG tablet Take 40 mg by mouth 2 (two) times daily.     primidone (MYSOLINE) 50 MG tablet Take 50 mg by mouth 4 (four) times daily.     propranolol (INDERAL) 10 MG tablet Take 10 mg by mouth 2 (two) times daily.     vitamin E 180 MG (400 UNITS) capsule Take 400 Units by mouth daily.     zinc gluconate 50 MG tablet Take 50 mg by mouth daily.     albuterol (VENTOLIN HFA) 108 (90 Base) MCG/ACT inhaler Inhale 2 puffs into the lungs every 6 (six) hours as needed for wheezing or shortness of breath. (Patient not taking: Reported on 01/01/2022) 8 g 0   Cholecalciferol 25 MCG (1000 UT) tablet Take 1,000 Units by mouth daily. (Patient not taking: Reported on 01/13/2022)     No current facility-administered medications for this visit.      Marland Kitchen  PHYSICAL EXAMINATION: ECOG PERFORMANCE STATUS: 1 - Symptomatic but completely ambulatory Vitals:   01/28/22 0955  BP: (!) 164/64  Pulse: (!) 56  Temp: (!) 96 F (35.6 C)  SpO2: 100%   Filed Weights   01/28/22 0955  Weight: 160 lb 3.2 oz (72.7 kg)    Physical Exam Constitutional:      General: He is not in acute distress.    Appearance: He is not diaphoretic.      Comments: Patient ambulates independently  HENT:     Head: Normocephalic and atraumatic.     Nose: Nose normal.     Mouth/Throat:     Pharynx: No oropharyngeal exudate.  Eyes:     General: No scleral icterus.    Pupils: Pupils are equal, round, and reactive to light.  Cardiovascular:     Rate and Rhythm: Normal rate.     Heart sounds: No murmur heard. Pulmonary:     Effort: Pulmonary effort is normal. No respiratory distress.     Breath sounds: No rales.  Chest:     Chest wall: No tenderness.  Abdominal:     General: There is no distension.     Palpations: Abdomen is soft.     Tenderness: There is no abdominal tenderness.  Musculoskeletal:        General: Normal range of motion.     Cervical back: Normal range of motion and neck supple.     Comments: Status post right knee replacement  Skin:    General: Skin is warm and dry.  Neurological:     Mental Status: He is alert and oriented to person, place, and time.  Psychiatric:        Mood and Affect: Affect normal.   .    LABORATORY DATA:  I have reviewed the data as listed    Latest Ref Rng & Units 01/28/2022    9:41 AM 01/01/2022   12:24 PM 11/01/2021   11:39 AM  CBC  WBC 4.0 - 10.5 K/uL 68.5  39.4  24.9   Hemoglobin 13.0 - 17.0 g/dL 13.0  13.5  13.1   Hematocrit 39.0 - 52.0 % 41.0  41.4  39.0   Platelets 150 - 400 K/uL 110  151  114       Latest Ref Rng & Units 01/28/2022    9:41 AM 01/01/2022   12:24 PM 11/01/2021   11:39 AM  CMP  Glucose 70 - 99 mg/dL 79  107  106   BUN 8 - 23 mg/dL 42  47  35   Creatinine 0.61 - 1.24 mg/dL 1.12  0.94  1.27   Sodium 135 - 145 mmol/L 142  136  139   Potassium 3.5 - 5.1 mmol/L 4.0  4.0  4.4   Chloride 98 - 111 mmol/L 105  101  103   CO2 22 - 32 mmol/L _0 Calcium 8.9 - 10.3 mg/dL 9.6  9.7  9.6   Total Protein 6.5 - 8.1 g/dL 7.5  7.1  7.0   Total Bilirubin 0.3 - 1.2 mg/dL 0.6  0.6  0.6   Alkaline Phos 38 - 126 U/L 67  82  67   AST 15 - 41 U/L _1 ALT 0  - 44 U/L 22  40  19     Chronic lymphocytic leukemia, B cell, CD38 positive (78%).  Cytogenetics revealed Trisomy 12 IGVH unmutated.  RADIOGRAPHIC STUDIES: I have personally reviewed the radiological images as listed and agreed with the findings in the report. no recent images. 11/04/2017  US abdomen Stable right renal cyst. Status post cholecystectomy.Previously seen lesion within the liver is not well appreciated on this exam. No Splenomegaly.

## 2022-01-28 NOTE — Assessment & Plan Note (Signed)
Stable counts. monitor 

## 2022-01-29 ENCOUNTER — Ambulatory Visit: Payer: Medicare Other | Admitting: Anesthesiology

## 2022-01-29 ENCOUNTER — Ambulatory Visit
Admission: RE | Admit: 2022-01-29 | Discharge: 2022-01-29 | Disposition: A | Discharge status: Home / Self Care | Payer: Medicare Other | Attending: Ophthalmology | Admitting: Ophthalmology

## 2022-01-29 ENCOUNTER — Encounter
Admission: RE | Disposition: A | Discharge status: Home / Self Care | Payer: Self-pay | Source: Home / Self Care | Attending: Ophthalmology

## 2022-01-29 ENCOUNTER — Other Ambulatory Visit: Payer: Self-pay

## 2022-01-29 DIAGNOSIS — I129 Hypertensive chronic kidney disease with stage 1 through stage 4 chronic kidney disease, or unspecified chronic kidney disease: Secondary | ICD-10-CM | POA: Insufficient documentation

## 2022-01-29 DIAGNOSIS — H5703 Miosis: Secondary | ICD-10-CM | POA: Diagnosis not present

## 2022-01-29 DIAGNOSIS — N183 Chronic kidney disease, stage 3 unspecified: Secondary | ICD-10-CM | POA: Insufficient documentation

## 2022-01-29 DIAGNOSIS — E039 Hypothyroidism, unspecified: Secondary | ICD-10-CM | POA: Diagnosis not present

## 2022-01-29 DIAGNOSIS — H2512 Age-related nuclear cataract, left eye: Secondary | ICD-10-CM | POA: Insufficient documentation

## 2022-01-29 DIAGNOSIS — Z8546 Personal history of malignant neoplasm of prostate: Secondary | ICD-10-CM | POA: Insufficient documentation

## 2022-01-29 DIAGNOSIS — H401122 Primary open-angle glaucoma, left eye, moderate stage: Secondary | ICD-10-CM | POA: Diagnosis not present

## 2022-01-29 HISTORY — PX: CATARACT EXTRACTION W/PHACO: SHX586

## 2022-01-29 SURGERY — PHACOEMULSIFICATION, CATARACT, WITH IOL INSERTION
Anesthesia: Monitor Anesthesia Care | Site: Eye | Laterality: Left

## 2022-01-29 MED ORDER — SIGHTPATH DOSE#1 BSS IO SOLN
INTRAOCULAR | Status: DC | PRN
  Administered 2022-01-29: 1 mL via INTRAMUSCULAR

## 2022-01-29 MED ORDER — LACTATED RINGERS IV SOLN: INTRAVENOUS | Status: DC

## 2022-01-29 MED ORDER — FENTANYL CITRATE (PF) 100 MCG/2ML IJ SOLN
INTRAMUSCULAR | Status: DC | PRN
  Administered 2022-01-29: 25 ug via INTRAVENOUS

## 2022-01-29 MED ORDER — CEFUROXIME OPHTHALMIC INJECTION 1 MG/0.1 ML
INJECTION | OPHTHALMIC | Status: DC | PRN
  Administered 2022-01-29: .1 mL via INTRACAMERAL

## 2022-01-29 MED ORDER — SIGHTPATH DOSE#1 NA HYALUR & NA CHOND-NA HYALUR IO KIT
PACK | INTRAOCULAR | Status: DC | PRN
  Administered 2022-01-29: 1 via OPHTHALMIC

## 2022-01-29 MED ORDER — SIGHTPATH DOSE#1 BSS IO SOLN
INTRAOCULAR | Status: DC | PRN
  Administered 2022-01-29: 15 mL

## 2022-01-29 MED ORDER — FENTANYL CITRATE PF 50 MCG/ML IJ SOSY: 25.0000 ug | PREFILLED_SYRINGE | INTRAMUSCULAR | Status: DC | PRN

## 2022-01-29 MED ORDER — ARMC OPHTHALMIC DILATING DROPS
1.0000 | OPHTHALMIC | Status: DC | PRN
  Administered 2022-01-29 (×3): 1 via OPHTHALMIC

## 2022-01-29 MED ORDER — ONDANSETRON HCL 4 MG/2ML IJ SOLN: 4.0000 mg | Freq: Once | INTRAMUSCULAR | Status: DC | PRN

## 2022-01-29 MED ORDER — SIGHTPATH DOSE#1 BSS IO SOLN
INTRAOCULAR | Status: DC | PRN
  Administered 2022-01-29: 75 mL via OPHTHALMIC

## 2022-01-29 MED ORDER — TETRACAINE HCL 0.5 % OP SOLN
1.0000 [drp] | OPHTHALMIC | Status: DC | PRN
  Administered 2022-01-29 (×3): 1 [drp] via OPHTHALMIC

## 2022-01-29 SURGICAL SUPPLY — 13 items
BLADE DUAL KAHOOK SINGLE USE (BLADE) IMPLANT
CATARACT SUITE SIGHTPATH (MISCELLANEOUS) ×1 IMPLANT
FEE CATARACT SUITE SIGHTPATH (MISCELLANEOUS) ×1 IMPLANT
GLOVE SRG 8 PF TXTR STRL LF DI (GLOVE) ×1 IMPLANT
GLOVE SURG ENC TEXT LTX SZ7.5 (GLOVE) ×1 IMPLANT
GLOVE SURG UNDER POLY LF SZ8 (GLOVE) ×1
ICLIP (OPHTHALMIC RELATED) IMPLANT
LENS IOL TECNIS EYHANCE 22.5 (Intraocular Lens) IMPLANT
NDL FILTER BLUNT 18X1 1/2 (NEEDLE) ×1 IMPLANT
NEEDLE FILTER BLUNT 18X1 1/2 (NEEDLE) ×1 IMPLANT
RING MALYGIN 7.0 (MISCELLANEOUS) IMPLANT
SYR 3ML LL SCALE MARK (SYRINGE) ×1 IMPLANT
WATER STERILE IRR 250ML POUR (IV SOLUTION) ×1 IMPLANT

## 2022-01-29 NOTE — Transfer of Care (Signed)
Immediate Anesthesia Transfer of Care Note  Patient: Ward A Vey  Procedure(s) Performed: CATARACT EXTRACTION PHACO AND INTRAOCULAR LENS PLACEMENT ([facility]) LEFT DIABETIC KAHOOK DUAL BLADE GONIOTOMY MALYUGIN  12.91  01:31.0 (Left: Eye)  Patient Location: PACU  Anesthesia Type: MAC  Level of Consciousness: awake, alert  and patient cooperative  Airway and Oxygen Therapy: Patient Spontanous Breathing and Patient connected to supplemental oxygen  Post-op Assessment: Post-op Vital signs reviewed, Patient's Cardiovascular Status Stable, Respiratory Function Stable, Patent Airway and No signs of Nausea or vomiting  Post-op Vital Signs: Reviewed and stable  Complications: No notable events documented.

## 2022-01-29 NOTE — H&P (Signed)
[place]   Primary Care Physician:  Tracie Harrier, MD Ophthalmologist: Dr. Leandrew Koyanagi  Pre-Procedure History & Physical: HPI:  Allen Chang is a 88 y.o. male here for ophthalmic surgery.   Past Medical History:  Diagnosis Date   Cancer ([place])    prostate    CKD (chronic kidney disease) stage 3, GFR 30-59 ml/min (HCC) 5/39/7673   Complication of anesthesia    bradycardia, in ICU for 24 hour after galbladder surgery.    Diverticulosis 2012   Dysrhythmia    Heart skips a beat   Elevated lipids    GERD (gastroesophageal reflux disease)    History of hiatal hernia    Hypertension    Hypothyroidism    Leukemia (HCC)    Prostate cancer (HCC)    Skin cancer    face, scalp, behind ear,back and hand   Tremors of nervous system     Past Surgical History:  Procedure Laterality Date   BLADDER TUMOR EXCISION     CARDIAC CATHETERIZATION     CATARACT EXTRACTION W/PHACO Right 01/16/2022   Procedure: CATARACT EXTRACTION PHACO AND INTRAOCULAR LENS PLACEMENT ([place]) RIGHT DIABETIC KAHOOK DUAL BLADE GONIOTOMY 14.34 01:38.4;  Surgeon: Leandrew Koyanagi, MD;  Location: [place];  Service: Ophthalmology;  Laterality: Right;   CHOLECYSTECTOMY N/A 10/10/2014   Procedure: LAPAROSCOPIC CHOLECYSTECTOMY WITH INTRAOPERATIVE CHOLANGIOGRAM;  Surgeon: Leonie Green, MD;  Location: [facility]S;  Service: General;  Laterality: N/A;   COLONOSCOPY WITH PROPOFOL N/A 02/16/2017   Procedure: COLONOSCOPY WITH PROPOFOL;  Surgeon: Manya Silvas, MD;  Location: [facility];  Service: Endoscopy;  Laterality: N/A;   ESOPHAGOGASTRODUODENOSCOPY (EGD) WITH PROPOFOL N/A 02/16/2017   Procedure: ESOPHAGOGASTRODUODENOSCOPY (EGD) WITH PROPOFOL;  Surgeon: Manya Silvas, MD;  Location: [facility] ENDOSCOPY;  Service: Endoscopy;  Laterality: N/A;   HEMORRHOID SURGERY     HERNIA REPAIR Left    x2   JOINT REPLACEMENT Left    Partial Knee Replacement, Dr. Roland Rack   PARTIAL KNEE  ARTHROPLASTY Left 12/12/2014   Procedure: UNICOMPARTMENTAL KNEE;  Surgeon: Corky Mull, MD;  Location: [facility] OR[facility];  Service: Orthopedics;  Laterality: Left;   PARTIAL KNEE ARTHROPLASTY Right 09/25/2020   Procedure: RIGHT PARTIAL KNEE REPLACEMENT;  Surgeon: Corky Mull, MD;  Location: [facility] OR[facility];  Service: Orthopedics;  Laterality: Right;   prostate seeding     SHOULDER ARTHROSCOPY Right    SHOULDER ARTHROSCOPY WITH OPEN ROTATOR CUFF REPAIR Left 02/07/2016   Procedure: SHOULDER ARTHROSCOPY WITH OPEN ROTATOR CUFF REPAIR;  Surgeon: Corky Mull, MD;  Location: [facility] OR[facility];  Service: Orthopedics;  Laterality: Left;    Prior to Admission medications   Medication Sig Start Date End Date Taking? Authorizing Provider  acalabrutinib (CALQUENCE) 100 MG capsule Take by mouth.   Yes [provider]  acalabrutinib maleate (CALQUENCE) 100 MG tablet Take 1 tablet (100 mg total) by mouth 2 (two) times daily. 01/02/22  Yes [name], MD  acetaminophen (TYLENOL) 500 MG tablet Take 1,000 mg by mouth every 8 (eight) hours as needed for moderate pain.   Yes [provider]  ascorbic acid (VITAMIN C) 1000 MG tablet Take 1,000 mg by mouth daily.   Yes [provider]  b complex vitamins capsule Take 1 capsule by mouth daily.   Yes [provider]  calcium carbonate (OSCAL) 1500 (600 Ca) MG TABS tablet Take 1,500 mg by mouth daily with breakfast.   Yes [provider]  cyanocobalamin 100 MCG tablet Take 100 mcg by mouth  daily.   Yes [provider]  donepezil (ARICEPT) 10 MG tablet Take 10 mg by mouth at bedtime. 05/24/20  Yes [provider]  DULoxetine (CYMBALTA) 20 MG capsule Take 20 mg by mouth daily. 06/08/19  Yes [provider]  famotidine (PEPCID) 40 MG tablet Take 40 mg by mouth 2 (two) times daily. 04/28/19  Yes [provider]  ferrous sulfate 325 (65 FE) MG EC tablet Take 325 mg by mouth 3 (three) times daily with meals.   Yes [provider]  Flaxseed, Linseed, (FLAX SEED OIL) 1000 MG CAPS Take 1,000 mg by mouth daily.   Yes [provider]  fluorouracil (EFUDEX) 5 % cream Apply to the back of your right hand twice per day for 5 to 7 days. STOP when the area becomes red and irritated even if you haven't done the full 7 days. 01/22/22  Yes [provider]  furosemide (LASIX) 20 MG tablet Take 20 mg by mouth daily. 07/10/21  Yes [provider]  gabapentin (NEURONTIN) 100 MG capsule Take 1 capsule by mouth at bedtime. 05/17/21 05/17/22 Yes [provider]  Garlic 5397 MG CAPS Take 1 capsule by mouth every morning.   Yes [provider]  Ginkgo Biloba (GINKOBA PO) Take 2 tablets by mouth daily.   Yes [provider]  latanoprost (XALATAN) 0.005 % ophthalmic solution Place 1 drop into both eyes at bedtime.   Yes [provider]  levothyroxine (SYNTHROID) 50 MCG tablet Take 50 mcg by mouth daily before breakfast. Take on an empty stomach with a glass of water at least 30-60 minutes before breakfast. 04/17/20 01/29/22 Yes [provider]  loratadine (CLARITIN REDITABS) 10 MG dissolvable tablet Take 10 mg by mouth daily. In am   Yes [provider]  Misc Natural Products (BLACK CHERRY CONCENTRATE) LIQD Take 15 mLs by mouth daily.    Yes [provider]  MISC NATURAL PRODUCTS PO Take 2 capsules by mouth daily. Beet juice capsules   Yes [provider]  Omega-3 Fatty Acids (FISH OIL) 1000 MG CAPS Take 1 capsule by mouth daily.   Yes [provider]  OVER THE COUNTER MEDICATION Take 1 capsule by mouth daily. Neuriva   Yes [provider]  OVER THE COUNTER MEDICATION Take 1 capsule by mouth daily. Ageless Brain   Yes [provider]  OVER THE COUNTER MEDICATION Take 1,000 mg by mouth daily. BACOPA   Yes [provider]  pantoprazole (PROTONIX) 40 MG tablet Take 40 mg by mouth 2 (two) times daily. 03/14/21   Yes [provider]  primidone (MYSOLINE) 50 MG tablet Take 50 mg by mouth 4 (four) times daily.   Yes [provider]  propranolol (INDERAL) 10 MG tablet Take 10 mg by mouth 2 (two) times daily.   Yes [provider]  vitamin E 180 MG (400 UNITS) capsule Take 400 Units by mouth daily.   Yes [provider]  zinc gluconate 50 MG tablet Take 50 mg by mouth daily.   Yes [provider]  albuterol (VENTOLIN HFA) 108 (90 Base) MCG/ACT inhaler Inhale 2 puffs into the lungs every 6 (six) hours as needed for wheezing or shortness of breath. Patient not taking: Reported on 01/01/2022 02/06/21   Loletha Grayer, MD  Cholecalciferol 25 MCG (1000 UT) tablet Take 1,000 Units by mouth daily. Patient not taking: Reported on 01/13/2022    [provider]    Allergies as of 12/13/2021 -  Review Complete 11/01/2021  Allergen Reaction Noted   Oxycodone-acetaminophen Other (See Comments) 10/03/2014   Voltaren [diclofenac sodium] Palpitations 09/29/2014    Family History  Problem Relation Age of Onset   Bone cancer Father    Hypertension Mother    Osteoporosis Mother    Breast cancer Sister    Cancer Brother    Cancer Brother    Lung cancer Brother    Bladder Cancer Brother    Melanoma Brother     Social History   Socioeconomic History   Marital status: Married    Spouse name: Not on file   Number of children: Not on file   Years of education: Not on file   Highest education level: Not on file  Occupational History   Not on file  Tobacco Use   Smoking status: Never   Smokeless tobacco: Never  Vaping Use   Vaping Use: Never used  Substance and Sexual Activity   Alcohol use: No   Drug use: No   Sexual activity: Yes  Other Topics Concern   Not on file  Social History Narrative   Not on file   Social Determinants of Health   Financial Resource Strain: Not on file  Food Insecurity: Not on file  Transportation Needs: Not on file   Physical Activity: Not on file  Stress: Not on file  Social Connections: Not on file  Intimate Partner Violence: Not on file    Review of Systems: See HPI, otherwise negative ROS  Physical Exam: BP (!) 147/68   Pulse 61   Temp 97.9 F (36.6 C) (Temporal)   Resp 11   Ht '5\' 9"'$  (1.753 m)   Wt 72.6 kg   SpO2 95%   BMI 23.63 kg/m  General:   Alert,  pleasant and cooperative in NAD Head:  Normocephalic and atraumatic. Lungs:  Clear to auscultation.    Heart:  Regular rate and rhythm.   Impression/Plan: Allen Chang is here for ophthalmic surgery.  Risks, benefits, limitations, and alternatives regarding ophthalmic surgery have been reviewed with the patient.  Questions have been answered.  All parties agreeable.   Leandrew Koyanagi, MD  01/29/2022, 7:32 AM

## 2022-01-29 NOTE — Anesthesia Postprocedure Evaluation (Signed)
Anesthesia Post Note  Patient: Allen Chang  Procedure(s) Performed: CATARACT EXTRACTION PHACO AND INTRAOCULAR LENS PLACEMENT ([facility]) LEFT DIABETIC KAHOOK DUAL BLADE GONIOTOMY MALYUGIN  12.91  01:31.0 (Left: Eye)  Patient location during evaluation: PACU Anesthesia Type: MAC Level of consciousness: awake and alert Pain management: pain level controlled Vital Signs Assessment: post-procedure vital signs reviewed and stable Respiratory status: spontaneous breathing, nonlabored ventilation, respiratory function stable and patient connected to nasal cannula oxygen Cardiovascular status: stable and blood pressure returned to baseline Postop Assessment: no apparent nausea or vomiting Anesthetic complications: no   No notable events documented.   Last Vitals:  Vitals:   01/29/22 0807 01/29/22 0812  BP: 134/63 (!) 140/69  Pulse: (!) 56 (!) 56  Resp: 13 14  Temp: (!) 36.3 C (!) 36.3 C  SpO2: 99% 94%    Last Pain:  Vitals:   01/29/22 0812  TempSrc:   PainSc: 0-No pain                 Molli Barrows

## 2022-01-29 NOTE — Op Note (Signed)
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 Item Serial No. Manufacturer Lot No. LRB No. Used Action  LENS IOL TECNIS EYHANCE 22.5 - [id number] Intraocular Lens LENS IOL TECNIS EYHANCE 22.5 [id number] [employer]  Left 1 Implanted    SURGEON:  Wyonia Hough, MD   ANESTHESIA:  Topical with tetracaine drops augmented with 1% preservative-free intracameral lidocaine.    COMPLICATIONS:  None.   DESCRIPTION OF PROCEDURE:  The patient was identified in the holding room and transported to the operating room and placed in the supine position under the operating microscope.  The left eye was identified as the operative eye and it was prepped and draped in the usual sterile ophthalmic fashion.   A 1 millimeter clear-corneal paracentesis was made at the 5:30 position.  0.5 ml of preservative-free 1% lidocaine was injected into the anterior chamber.  The anterior chamber was filled with Viscoat viscoelastic.  A 2.4 millimeter keratome was used to make a near-clear corneal incision at the 2:30 position. The microscope was adjusted and a gonioprism was used to visulaize the trabecular meshwork.  The [facility] Dual Blade was advanced across the anterior chamber under viscoelastic.  The blade was used to mark the trabecular meshwork at the 7:30 position.  The blade was placed two clock hours  clockwise into the meshwork.  Proper postioning was confirmed.  The blade ws passed counterclockwise through the meshwork to excise approximately two to three clock-hours of trabecular meshwork. A Malyugin ring was placed to emlarge the pupil to 57m for cataract extraction.  A curvilinear capsulorrhexis was made with a cystotome and capsulorrhexis forceps.  Balanced salt solution was used to hydrodissect and hydrodelineate the nucleus.   Phacoemulsification was then used in stop and chop fashion to remove the lens nucleus and epinucleus.  The remaining cortex was then removed using the irrigation and aspiration handpiece. Provisc was then placed into the capsular bag to distend it for lens placement.  A lens was then injected into the capsular bag. The Malyugin ring was removed. The remaining viscoelastic was aspirated.   Wounds were hydrated with balanced salt solution.  The anterior chamber was inflated to a physiologic pressure with balanced salt solution.  No wound leaks were noted. Cefuroxime 0.1 ml of a '10mg'$ /ml solution was injected into the anterior chamber for a dose of 1 mg of intracameral antibiotic at the completion of the case.  The patient was taken to the recovery room in stable condition without complications of anesthesia or surgery.

## 2022-01-29 NOTE — Anesthesia Preprocedure Evaluation (Signed)
Anesthesia Evaluation  Patient identified by MRN, date of birth, ID band Patient awake    Reviewed: Allergy & Precautions, H&P , NPO status , Patient's Chart, lab work & pertinent test results, reviewed documented beta blocker date and time   History of Anesthesia Complications (+) history of anesthetic complications  Airway Mallampati: II  TM Distance: >3 FB Neck ROM: full    Dental no notable dental hx. (+) Teeth Intact   Pulmonary pneumonia, resolved   Pulmonary exam normal breath sounds clear to auscultation       Cardiovascular Exercise Tolerance: Good hypertension, On Medications + dysrhythmias  Rhythm:regular Rate:Normal     Neuro/Psych  PSYCHIATRIC DISORDERS     Dementia  Neuromuscular disease    GI/Hepatic Neg liver ROS, hiatal hernia,GERD  Medicated,,  Endo/Other  diabetesHypothyroidism    Renal/GU Renal disease     Musculoskeletal   Abdominal   Peds  Hematology  (+) Blood dyscrasia, anemia Wbc noted and    Anesthesia Other Findings   Reproductive/Obstetrics negative OB ROS                             Anesthesia Physical Anesthesia Plan  ASA: 3  Anesthesia Plan: MAC   Post-op Pain Management:    Induction:   PONV Risk Score and Plan:   Airway Management Planned:   Additional Equipment:   Intra-op Plan:   Post-operative Plan:   Informed Consent: I have reviewed the patients History and Physical, chart, labs and discussed the procedure including the risks, benefits and alternatives for the proposed anesthesia with the patient or authorized representative who has indicated his/her understanding and acceptance.       Plan Discussed with: CRNA  Anesthesia Plan Comments:        Anesthesia Quick Evaluation

## 2022-01-30 ENCOUNTER — Encounter: Payer: Self-pay | Admitting: Ophthalmology

## 2022-02-20 ENCOUNTER — Other Ambulatory Visit: Payer: Self-pay | Admitting: Pharmacist

## 2022-02-20 ENCOUNTER — Other Ambulatory Visit (HOSPITAL_COMMUNITY): Payer: Self-pay

## 2022-02-20 ENCOUNTER — Encounter: Payer: Self-pay | Admitting: Oncology

## 2022-02-20 ENCOUNTER — Inpatient Hospital Stay (HOSPITAL_BASED_OUTPATIENT_CLINIC_OR_DEPARTMENT_OTHER): Payer: Medicare Other | Admitting: Oncology

## 2022-02-20 ENCOUNTER — Inpatient Hospital Stay: Payer: Medicare Other | Attending: Oncology

## 2022-02-20 VITALS — BP 125/68 | HR 71 | Temp 97.2°F | Wt 162.4 lb

## 2022-02-20 DIAGNOSIS — Z79899 Other long term (current) drug therapy: Secondary | ICD-10-CM | POA: Insufficient documentation

## 2022-02-20 DIAGNOSIS — D472 Monoclonal gammopathy: Secondary | ICD-10-CM | POA: Diagnosis not present

## 2022-02-20 DIAGNOSIS — C9110 Chronic lymphocytic leukemia of B-cell type not having achieved remission: Secondary | ICD-10-CM | POA: Insufficient documentation

## 2022-02-20 DIAGNOSIS — D696 Thrombocytopenia, unspecified: Secondary | ICD-10-CM

## 2022-02-20 DIAGNOSIS — N1831 Chronic kidney disease, stage 3a: Secondary | ICD-10-CM

## 2022-02-20 LAB — CBC WITH DIFFERENTIAL/PLATELET
Abs Immature Granulocytes: 0.1 10*3/uL — ABNORMAL HIGH (ref 0.00–0.07)
Basophils Absolute: 0 10*3/uL (ref 0.0–0.1)
Basophils Relative: 0 %
Eosinophils Absolute: 0.2 10*3/uL (ref 0.0–0.5)
Eosinophils Relative: 0 %
HCT: 37 % — ABNORMAL LOW (ref 39.0–52.0)
Hemoglobin: 12 g/dL — ABNORMAL LOW (ref 13.0–17.0)
Immature Granulocytes: 0 %
Lymphocytes Relative: 94 %
Lymphs Abs: 69 10*3/uL — ABNORMAL HIGH (ref 0.7–4.0)
MCH: 35.8 pg — ABNORMAL HIGH (ref 26.0–34.0)
MCHC: 32.4 g/dL (ref 30.0–36.0)
MCV: 110.4 fL — ABNORMAL HIGH (ref 80.0–100.0)
Monocytes Absolute: 0.8 10*3/uL (ref 0.1–1.0)
Monocytes Relative: 1 %
Neutro Abs: 3.6 10*3/uL (ref 1.7–7.7)
Neutrophils Relative %: 5 %
Platelets: 102 10*3/uL — ABNORMAL LOW (ref 150–400)
RBC: 3.35 MIL/uL — ABNORMAL LOW (ref 4.22–5.81)
RDW: 13.1 % (ref 11.5–15.5)
Smear Review: NORMAL
WBC: 73.7 10*3/uL (ref 4.0–10.5)
nRBC: 0 % (ref 0.0–0.2)

## 2022-02-20 LAB — COMPREHENSIVE METABOLIC PANEL
ALT: 24 U/L (ref 0–44)
AST: 19 U/L (ref 15–41)
Albumin: 3.9 g/dL (ref 3.5–5.0)
Alkaline Phosphatase: 58 U/L (ref 38–126)
Anion gap: 8 (ref 5–15)
BUN: 42 mg/dL — ABNORMAL HIGH (ref 8–23)
CO2: 28 mmol/L (ref 22–32)
Calcium: 9.2 mg/dL (ref 8.9–10.3)
Chloride: 104 mmol/L (ref 98–111)
Creatinine, Ser: 1.13 mg/dL (ref 0.61–1.24)
GFR, Estimated: >60 mL/min (ref >60)
Glucose, Bld: 92 mg/dL (ref 70–99)
Potassium: 4.1 mmol/L (ref 3.5–5.1)
Sodium: 140 mmol/L (ref 135–145)
Total Bilirubin: 0.6 mg/dL (ref 0.3–1.2)
Total Protein: 7 g/dL (ref 6.5–8.1)

## 2022-02-20 MED ORDER — ACALABRUTINIB MALEATE 100 MG PO TABS: 100.0000 mg | ORAL_TABLET | Freq: Two times a day (BID) | ORAL | 2 refills | Status: DC

## 2022-02-20 MED ORDER — ACALABRUTINIB MALEATE 100 MG PO TABS
100.0000 mg | ORAL_TABLET | Freq: Two times a day (BID) | ORAL | 2 refills | Status: DC
  Filled 2022-02-20: qty 60, 30d supply, fill #0

## 2022-02-20 NOTE — Assessment & Plan Note (Signed)
Stable counts. monitor 

## 2022-02-20 NOTE — Progress Notes (Signed)
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 Stage III, extensive bone marrow involvement. Currently off acalabrutinib 100 mg twice daily Previous peripheral blood flow cytometry showed CLL population, 58% of total cells. Labs are reviewed and discussed with patient. Leukocytosis is as expected- side effects from treatment, anticipate to decrease in the near future.  Continue acalabrutinib 100 mg twice daily  Thrombocytopenia (HCC) Stable counts. monitor  MGUS (monoclonal gammopathy of unknown significance) Labs are reviewed and discussed with patient. Stable M protein and normal light chain ration.  Continue observation. Repeat labs every 6 months.   Orders Placed This Encounter  Procedures   CBC with Differential/Platelet    Standing Status:   Future    Standing Expiration Date:   02/21/2023   CBC with Differential/Platelet    Standing Status:   Future    Standing Expiration Date:   02/21/2023   Comprehensive metabolic panel    Standing Status:   Future    Standing Expiration Date:   02/20/2023   Follow up in 1 month All questions were answered. The patient knows to call the clinic with any problems, questions or concerns.  [name], MD, PhD [facility] Hematology Oncology 02/20/2022   REASON FOR VISIT  follow up for CLL and iron deficiency anemia, MGUS  HISTORY OF PRESENTING ILLNESS:  Allen Chang 89 y.o.  male with PMH listed as below who was referred by primary care provider to me for evaluation of leukocytosis. He has a history of prostate cancer which was diagnosed 10 years ago. He underwent brachytherapy. He is not any castration treatment. Per patient, he follows up with Dr.Wolf and most  recent PSA is normal.   Patient also reports feeling fatigue lately. Recent labs show leukocytosis, mild anemia, macrocytosis, mild thrombocytopenia, low ferritin. Denies blood in the stool. He was started on over the counter iron supplement but wife who manages patient's medication decides to only let patient to take iron medication every other day.   # received IV iron Venofer x 4. Colonoscopy was done on 02/16/2017 which showed polyps, internal hemorrhoids, negative for malignancy.  ##Patient had bone marrow biopsy on 09/22/2018 Pathology showed hypercellular bone marrow with extensive involvement by chronic lymphocytic leukemia. # Started on acalabrutinib 100 mg twice daily on 10/01/2018. 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  #August 2020-acalabrutinib 100 mg twice daily Stopped in October 2021 during his admission.  # admitted from 12/16/19- 10/30/ 2021 due to sudden onset chest pain. Troponins were negative in the emergency room.  Initial EKG showed atrial fibrillation with heart rate of 55. Patient was seen by cardiology and clarified that the EKG and telemetry actually showed sinus rhythm with PACs patient did not require any anticoagulation.Echocardiogram showed normal EF and no wall motion abnormality.  Nuclear stress testing showed low risk study, EF 63%. Patient was discharged home and followed up with Dr. Clayborn Bigness on January 26, 2020 Currently off of acalabrutinib since October 2021 admission.  INTERVAL HISTORY Allen Chang is a 89 y.o. male who has above history reviewed by me presents for 4 months follow up for CLL and iron deficiency anemia, MGUS Today patient was accompanied by his wife.  Appetite is fair.  He has lost 4 pounds  since last visit. 03/04/2021 started on acalabrutinib 173m BID . Night sweat is less frequent. Appetite is fair. Weight is stable.   Review of Systems  Constitutional:  Positive for appetite change and unexpected weight  change. Negative for chills, fatigue and fever.  HENT:   Negative for hearing loss and voice change.   Eyes:  Negative for eye problems and icterus.  Respiratory:  Negative for chest tightness, cough and shortness of breath.   Cardiovascular:  Negative for chest pain and leg swelling.  Gastrointestinal:  Negative for abdominal distention and abdominal pain.  Endocrine: Negative for hot flashes.  Genitourinary:  Negative for difficulty urinating, dysuria and frequency.   Musculoskeletal:  Positive for arthralgias. Negative for back pain.  Skin:  Negative for itching and rash.       Skin cancer  Neurological:  Negative for light-headedness and numbness.  Hematological:  Negative for adenopathy. Does not bruise/bleed easily.  Psychiatric/Behavioral:  Negative for confusion.     MEDICAL HISTORY:  Past Medical History:  Diagnosis Date   Cancer ([facility]    prostate    CKD (chronic kidney disease) stage 3, GFR 30-59 ml/min (HCC) 81/61/0960  Complication of anesthesia    bradycardia, in ICU for 24 hour after galbladder surgery.    Diverticulosis 2012   Dysrhythmia    Heart skips a beat   Elevated lipids    GERD (gastroesophageal reflux disease)    History of hiatal hernia    Hypertension    Hypothyroidism    Leukemia (HCC)    Prostate cancer (HCC)    Skin cancer    face, scalp, behind ear,back and hand   Tremors of nervous system     SURGICAL HISTORY: Past Surgical History:  Procedure Laterality Date   BLADDER TUMOR EXCISION     CARDIAC CATHETERIZATION     CATARACT EXTRACTION W/PHACO Right 01/16/2022   Procedure: CATARACT EXTRACTION PHACO AND INTRAOCULAR LENS PLACEMENT ([place] RIGHT DIABETIC KAHOOK DUAL BLADE GONIOTOMY 14.34 01:38.4;  Surgeon: BLeandrew Koyanagi MD;  [place] [facility]  Service: Ophthalmology;  Laterality: Right;   CATARACT EXTRACTION W/PHACO Left 01/29/2022   Procedure: CATARACT EXTRACTION PHACO AND INTRAOCULAR LENS PLACEMENT ([place] LEFT DIABETIC  KAHOOK DUAL BLADE GONIOTOMY MALYUGIN  12.91  01:31.0;  Surgeon: BLeandrew Koyanagi MD;  [place] [facility] [facility]  Service: Ophthalmology;  Laterality: Left;   CHOLECYSTECTOMY N/A 10/10/2014   Procedure: LAPAROSCOPIC CHOLECYSTECTOMY WITH INTRAOPERATIVE CHOLANGIOGRAM;  Surgeon: JLeonie Green MD;  Location: [facility];  Service: General;  Laterality: N/A;   COLONOSCOPY WITH PROPOFOL N/A 02/16/2017   Procedure: COLONOSCOPY WITH PROPOFOL;  Surgeon: EManya Silvas MD;  [facility];  Service: Endoscopy;  Laterality: N/A;   ESOPHAGOGASTRODUODENOSCOPY (EGD) WITH PROPOFOL N/A 02/16/2017   Procedure: ESOPHAGOGASTRODUODENOSCOPY (EGD) WITH PROPOFOL;  Surgeon: EManya Silvas MD;  [facility];  Service: Endoscopy;  Laterality: N/A;   HEMORRHOID SURGERY     HERNIA REPAIR Left    x2   JOINT REPLACEMENT Left    Partial Knee Replacement, Dr. PRoland Rack  PARTIAL KNEE ARTHROPLASTY Left 12/12/2014   Procedure: UNICOMPARTMENTAL KNEE;  Surgeon: JCorky Mull MD;  Location: [facility];  Service: Orthopedics;  Laterality: Left;   PARTIAL KNEE ARTHROPLASTY Right 09/25/2020   Procedure: RIGHT PARTIAL KNEE REPLACEMENT;  Surgeon: PCorky Mull MD;  Location: [facility];  Service: Orthopedics;  Laterality: Right;   prostate seeding     SHOULDER ARTHROSCOPY Right    SHOULDER ARTHROSCOPY WITH OPEN ROTATOR CUFF REPAIR Left  02/07/2016   Procedure: SHOULDER ARTHROSCOPY WITH OPEN ROTATOR CUFF REPAIR;  Surgeon: Corky Mull, MD;  Location: [facility];  Service: Orthopedics;  Laterality: Left;    SOCIAL HISTORY: Social History   Socioeconomic History   Marital status: Married    Spouse name: Not on file   Number of children: Not on file   Years of education: Not on file   Highest education level: Not on file  Occupational History   Not on file  Tobacco Use   Smoking status: Never   Smokeless tobacco: Never  Vaping Use   Vaping Use: Never used  Substance and Sexual Activity    Alcohol use: No   Drug use: No   Sexual activity: Yes  Other Topics Concern   Not on file  Social History Narrative   Not on file   Social Determinants of Health   Financial Resource Strain: Not on file  Food Insecurity: Not on file  Transportation Needs: Not on file  Physical Activity: Not on file  Stress: Not on file  Social Connections: Not on file  Intimate Partner Violence: Not on file    FAMILY HISTORY: Family History  Problem Relation Age of Onset   Bone cancer Father    Hypertension Mother    Osteoporosis Mother    Breast cancer Sister    Cancer Brother    Cancer Brother    Lung cancer Brother    Bladder Cancer Brother    Melanoma Brother     ALLERGIES:  is allergic to oxycodone-acetaminophen and voltaren [diclofenac sodium].  MEDICATIONS:  Current Outpatient Medications  Medication Sig Dispense Refill   acalabrutinib (CALQUENCE) 100 MG capsule Take by mouth.     acetaminophen (TYLENOL) 500 MG tablet Take 1,000 mg by mouth every 8 (eight) hours as needed for moderate pain.     ascorbic acid (VITAMIN C) 1000 MG tablet Take 1,000 mg by mouth daily.     b complex vitamins capsule Take 1 capsule by mouth daily.     calcium carbonate (OSCAL) 1500 (600 Ca) MG TABS tablet Take 1,500 mg by mouth daily with breakfast.     Cholecalciferol 25 MCG (1000 UT) tablet Take 1,000 Units by mouth daily.     cyanocobalamin 100 MCG tablet Take 100 mcg by mouth daily.     donepezil (ARICEPT) 10 MG tablet Take 10 mg by mouth at bedtime.     DULoxetine (CYMBALTA) 20 MG capsule Take 20 mg by mouth daily.     famotidine (PEPCID) 40 MG tablet Take 40 mg by mouth 2 (two) times daily.     ferrous sulfate 325 (65 FE) MG EC tablet Take 325 mg by mouth 3 (three) times daily with meals.     Flaxseed, Linseed, (FLAX SEED OIL) 1000 MG CAPS Take 1,000 mg by mouth daily.     fluorouracil (EFUDEX) 5 % cream Apply to the back of your right hand twice per day for 5 to 7 days. STOP when the area  becomes red and irritated even if you haven't done the full 7 days.     furosemide (LASIX) 20 MG tablet Take 20 mg by mouth daily.     gabapentin (NEURONTIN) 100 MG capsule Take 1 capsule by mouth at bedtime.     Garlic 6237 MG CAPS Take 1 capsule by mouth every morning.     Ginkgo Biloba (GINKOBA PO) Take 2 tablets by mouth daily.     latanoprost (XALATAN) 0.005 % ophthalmic solution Place 1  drop into both eyes at bedtime.     levothyroxine (SYNTHROID) 50 MCG tablet Take 50 mcg by mouth daily before breakfast. Take on an empty stomach with a glass of water at least 30-60 minutes before breakfast.     loratadine (CLARITIN REDITABS) 10 MG dissolvable tablet Take 10 mg by mouth daily. In am     Misc Natural Products (BLACK CHERRY CONCENTRATE) LIQD Take 15 mLs by mouth daily.      MISC NATURAL PRODUCTS PO Take 2 capsules by mouth daily. Beet juice capsules     Omega-3 Fatty Acids (FISH OIL) 1000 MG CAPS Take 1 capsule by mouth daily.     OVER THE COUNTER MEDICATION Take 1 capsule by mouth daily. Neuriva     OVER THE COUNTER MEDICATION Take 1 capsule by mouth daily. Ageless Brain     OVER THE COUNTER MEDICATION Take 1,000 mg by mouth daily. BACOPA     pantoprazole (PROTONIX) 40 MG tablet Take 40 mg by mouth 2 (two) times daily.     primidone (MYSOLINE) 50 MG tablet Take 50 mg by mouth 4 (four) times daily.     propranolol (INDERAL) 10 MG tablet Take 10 mg by mouth 2 (two) times daily.     vitamin E 180 MG (400 UNITS) capsule Take 400 Units by mouth daily.     zinc gluconate 50 MG tablet Take 50 mg by mouth daily.     acalabrutinib maleate (CALQUENCE) 100 MG tablet Take 1 tablet (100 mg total) by mouth 2 (two) times daily. 60 tablet 2   albuterol (VENTOLIN HFA) 108 (90 Base) MCG/ACT inhaler Inhale 2 puffs into the lungs every 6 (six) hours as needed for wheezing or shortness of breath. (Patient not taking: Reported on 01/01/2022) 8 g 0   No current facility-administered medications for this visit.       Marland Kitchen  PHYSICAL EXAMINATION: ECOG PERFORMANCE STATUS: 1 - Symptomatic but completely ambulatory Vitals:   02/20/22 1332  BP: 125/68  Pulse: 71  Temp: (!) 97.2 F (36.2 C)  SpO2: 100%   Filed Weights   02/20/22 1332  Weight: 162 lb 6.4 oz (73.7 kg)    Physical Exam Constitutional:      General: He is not in acute distress.    Appearance: He is not diaphoretic.     Comments: Patient ambulates independently  HENT:     Head: Normocephalic and atraumatic.     Nose: Nose normal.     Mouth/Throat:     Pharynx: No oropharyngeal exudate.  Eyes:     General: No scleral icterus.    Pupils: Pupils are equal, round, and reactive to light.  Cardiovascular:     Rate and Rhythm: Normal rate.     Heart sounds: No murmur heard. Pulmonary:     Effort: Pulmonary effort is normal. No respiratory distress.     Breath sounds: No rales.  Chest:     Chest wall: No tenderness.  Abdominal:     General: There is no distension.     Palpations: Abdomen is soft.     Tenderness: There is no abdominal tenderness.  Musculoskeletal:        General: Normal range of motion.     Cervical back: Normal range of motion and neck supple.     Comments: Status post right knee replacement  Skin:    General: Skin is warm and dry.  Neurological:     Mental Status: He is alert and oriented to person, place,  and time.  Psychiatric:        Mood and Affect: Affect normal.   .    LABORATORY DATA:  I have reviewed the data as listed    Latest Ref Rng & Units 02/20/2022   12:53 PM 01/28/2022    9:41 AM 01/01/2022   12:24 PM  CBC  WBC 4.0 - 10.5 K/uL 73.7  68.5  39.4   Hemoglobin 13.0 - 17.0 g/dL 12.0  13.0  13.5   Hematocrit 39.0 - 52.0 % 37.0  41.0  41.4   Platelets 150 - 400 K/uL 102  110  151       Latest Ref Rng & Units 02/20/2022   12:53 PM 01/28/2022    9:41 AM 01/01/2022   12:24 PM  CMP  Glucose 70 - 99 mg/dL 92  79  107   BUN 8 - 23 mg/dL 42  42  47   Creatinine 0.61 - 1.24 mg/dL 1.13   1.12  0.94   Sodium 135 - 145 mmol/L 140  142  136   Potassium 3.5 - 5.1 mmol/L 4.1  4.0  4.0   Chloride 98 - 111 mmol/L 104  105  101   CO2 22 - 32 mmol/L _0 Calcium 8.9 - 10.3 mg/dL 9.2  9.6  9.7   Total Protein 6.5 - 8.1 g/dL 7.0  7.5  7.1   Total Bilirubin 0.3 - 1.2 mg/dL 0.6  0.6  0.6   Alkaline Phos 38 - 126 U/L 58  67  82   AST 15 - 41 U/L _1 ALT 0 - 44 U/L 24  22  40     Chronic lymphocytic leukemia, B cell, CD38 positive (78%).  Cytogenetics revealed Trisomy 12 IGVH unmutated.  RADIOGRAPHIC STUDIES: I have personally reviewed the radiological images as listed and agreed with the findings in the report. no recent images. 11/04/2017  US abdomen Stable right renal cyst. Status post cholecystectomy.Previously seen lesion within the liver is not well appreciated on this exam. No Splenomegaly.

## 2022-02-20 NOTE — Assessment & Plan Note (Signed)
CLL, intermediate risk with trisomy 50. IgVH unmutated. Stage III, extensive bone marrow involvement. Currently off acalabrutinib 100 mg twice daily Previous peripheral blood flow cytometry showed CLL population, 58% of total cells. Labs are reviewed and discussed with patient. Leukocytosis is as expected- side effects from treatment, anticipate to decrease in the near future.  Continue acalabrutinib 100 mg twice daily

## 2022-02-20 NOTE — Assessment & Plan Note (Signed)
Labs are reviewed and discussed with patient. Stable M protein and normal light chain ration.  Continue observation. Repeat labs every 6 months.  

## 2022-02-21 ENCOUNTER — Other Ambulatory Visit: Payer: Medicare Other

## 2022-02-21 ENCOUNTER — Ambulatory Visit: Payer: Medicare Other | Admitting: Oncology

## 2022-02-21 LAB — KAPPA/LAMBDA LIGHT CHAINS
Kappa free light chain: 39.6 mg/L — ABNORMAL HIGH (ref 3.3–19.4)
Kappa, lambda light chain ratio: 0.84 (ref 0.26–1.65)
Lambda free light chains: 47.2 mg/L — ABNORMAL HIGH (ref 5.7–26.3)

## 2022-02-25 LAB — MULTIPLE MYELOMA PANEL, SERUM
Albumin SerPl Elph-Mcnc: 3.7 g/dL (ref 2.9–4.4)
Albumin/Glob SerPl: 1.4 (ref 0.7–1.7)
Alpha 1: 0.2 g/dL (ref 0.0–0.4)
Alpha2 Glob SerPl Elph-Mcnc: 0.7 g/dL (ref 0.4–1.0)
B-Globulin SerPl Elph-Mcnc: 0.7 g/dL (ref 0.7–1.3)
Gamma Glob SerPl Elph-Mcnc: 1.1 g/dL (ref 0.4–1.8)
Globulin, Total: 2.8 g/dL (ref 2.2–3.9)
IgA: 51 mg/dL — ABNORMAL LOW (ref 61–437)
IgG (Immunoglobin G), Serum: 1157 mg/dL (ref 603–1613)
IgM (Immunoglobulin M), Srm: 72 mg/dL (ref 15–143)
M Protein SerPl Elph-Mcnc: 0.7 g/dL — ABNORMAL HIGH
Total Protein ELP: 6.5 g/dL (ref 6.0–8.5)

## 2022-03-10 ENCOUNTER — Other Ambulatory Visit (HOSPITAL_COMMUNITY): Payer: Self-pay

## 2022-03-13 ENCOUNTER — Inpatient Hospital Stay: Payer: Medicare Other

## 2022-03-13 DIAGNOSIS — C9110 Chronic lymphocytic leukemia of B-cell type not having achieved remission: Secondary | ICD-10-CM | POA: Diagnosis not present

## 2022-03-13 LAB — CBC WITH DIFFERENTIAL/PLATELET
Abs Immature Granulocytes: 0.06 10*3/uL (ref 0.00–0.07)
Basophils Absolute: 0 10*3/uL (ref 0.0–0.1)
Basophils Relative: 0 %
Eosinophils Absolute: 0.1 10*3/uL (ref 0.0–0.5)
Eosinophils Relative: 0 %
HCT: 38.5 % — ABNORMAL LOW (ref 39.0–52.0)
Hemoglobin: 12.2 g/dL — ABNORMAL LOW (ref 13.0–17.0)
Immature Granulocytes: 0 %
Lymphocytes Relative: 91 %
Lymphs Abs: 46.6 10*3/uL — ABNORMAL HIGH (ref 0.7–4.0)
MCH: 36.2 pg — ABNORMAL HIGH (ref 26.0–34.0)
MCHC: 31.7 g/dL (ref 30.0–36.0)
MCV: 114.2 fL — ABNORMAL HIGH (ref 80.0–100.0)
Monocytes Absolute: 0.7 10*3/uL (ref 0.1–1.0)
Monocytes Relative: 2 %
Neutro Abs: 3.4 10*3/uL (ref 1.7–7.7)
Neutrophils Relative %: 7 %
Platelets: 111 10*3/uL — ABNORMAL LOW (ref 150–400)
RBC: 3.37 MIL/uL — ABNORMAL LOW (ref 4.22–5.81)
RDW: 13.1 % (ref 11.5–15.5)
Smear Review: NORMAL
WBC: 50.8 10*3/uL (ref 4.0–10.5)

## 2022-04-08 ENCOUNTER — Encounter: Payer: Self-pay | Admitting: Oncology

## 2022-04-08 ENCOUNTER — Inpatient Hospital Stay: Payer: Medicare Other

## 2022-04-08 ENCOUNTER — Inpatient Hospital Stay: Payer: Medicare Other | Attending: Oncology | Admitting: Oncology

## 2022-04-08 VITALS — BP 114/63 | HR 62 | Temp 96.0°F | Resp 18 | Wt 166.3 lb

## 2022-04-08 DIAGNOSIS — Z5111 Encounter for antineoplastic chemotherapy: Secondary | ICD-10-CM | POA: Diagnosis not present

## 2022-04-08 DIAGNOSIS — D472 Monoclonal gammopathy: Secondary | ICD-10-CM | POA: Insufficient documentation

## 2022-04-08 DIAGNOSIS — I4891 Unspecified atrial fibrillation: Secondary | ICD-10-CM | POA: Insufficient documentation

## 2022-04-08 DIAGNOSIS — D696 Thrombocytopenia, unspecified: Secondary | ICD-10-CM | POA: Insufficient documentation

## 2022-04-08 DIAGNOSIS — Z8546 Personal history of malignant neoplasm of prostate: Secondary | ICD-10-CM | POA: Insufficient documentation

## 2022-04-08 DIAGNOSIS — Z9049 Acquired absence of other specified parts of digestive tract: Secondary | ICD-10-CM | POA: Diagnosis not present

## 2022-04-08 DIAGNOSIS — C9110 Chronic lymphocytic leukemia of B-cell type not having achieved remission: Secondary | ICD-10-CM | POA: Diagnosis present

## 2022-04-08 DIAGNOSIS — Z79899 Other long term (current) drug therapy: Secondary | ICD-10-CM | POA: Diagnosis not present

## 2022-04-08 LAB — CBC WITH DIFFERENTIAL/PLATELET
Abs Immature Granulocytes: 0.04 10*3/uL (ref 0.00–0.07)
Basophils Absolute: 0.1 10*3/uL (ref 0.0–0.1)
Basophils Relative: 0 %
Eosinophils Absolute: 0.1 10*3/uL (ref 0.0–0.5)
Eosinophils Relative: 1 %
HCT: 41.6 % (ref 39.0–52.0)
Hemoglobin: 13.3 g/dL (ref 13.0–17.0)
Immature Granulocytes: 0 %
Lymphocytes Relative: 87 %
Lymphs Abs: 25.7 10*3/uL — ABNORMAL HIGH (ref 0.7–4.0)
MCH: 35.4 pg — ABNORMAL HIGH (ref 26.0–34.0)
MCHC: 32 g/dL (ref 30.0–36.0)
MCV: 110.6 fL — ABNORMAL HIGH (ref 80.0–100.0)
Monocytes Absolute: 0.5 10*3/uL (ref 0.1–1.0)
Monocytes Relative: 2 %
Neutro Abs: 3.1 10*3/uL (ref 1.7–7.7)
Neutrophils Relative %: 10 %
Platelets: 104 10*3/uL — ABNORMAL LOW (ref 150–400)
RBC: 3.76 MIL/uL — ABNORMAL LOW (ref 4.22–5.81)
RDW: 12.4 % (ref 11.5–15.5)
Smear Review: NORMAL
WBC: 29.6 10*3/uL — ABNORMAL HIGH (ref 4.0–10.5)
nRBC: 0 % (ref 0.0–0.2)

## 2022-04-08 LAB — COMPREHENSIVE METABOLIC PANEL
ALT: 23 U/L (ref 0–44)
AST: 22 U/L (ref 15–41)
Albumin: 4.1 g/dL (ref 3.5–5.0)
Alkaline Phosphatase: 57 U/L (ref 38–126)
Anion gap: 9 (ref 5–15)
BUN: 35 mg/dL — ABNORMAL HIGH (ref 8–23)
CO2: 25 mmol/L (ref 22–32)
Calcium: 9.1 mg/dL (ref 8.9–10.3)
Chloride: 107 mmol/L (ref 98–111)
Creatinine, Ser: 1.13 mg/dL (ref 0.61–1.24)
GFR, Estimated: >60 mL/min (ref >60)
Glucose, Bld: 107 mg/dL — ABNORMAL HIGH (ref 70–99)
Potassium: 3.7 mmol/L (ref 3.5–5.1)
Sodium: 141 mmol/L (ref 135–145)
Total Bilirubin: 0.6 mg/dL (ref 0.3–1.2)
Total Protein: 7.3 g/dL (ref 6.5–8.1)

## 2022-04-08 NOTE — Assessment & Plan Note (Signed)
Treatment as planned above.

## 2022-04-08 NOTE — Progress Notes (Signed)
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 Stage III, extensive bone marrow involvement. Labs are reviewed and discussed with patient. WBC is trending down.  Patient tolerates treatment well. Continue acalabrutinib 100 mg twice daily  MGUS (monoclonal gammopathy of unknown significance) Labs are reviewed and discussed with patient. Stable M protein and normal light chain ration.  Continue observation. Repeat labs every 6 months.   Thrombocytopenia (HCC) Stable counts. monitor  Encounter for antineoplastic chemotherapy Treatment as planned above.  Orders Placed This Encounter  Procedures   CBC with Differential ([place] Only)    Standing Status:   Future    Standing Expiration Date:   04/09/2023   Comprehensive metabolic panel    Standing Status:   Future    Standing Expiration Date:   04/09/2023   Lactate dehydrogenase    Standing Status:   Future    Standing Expiration Date:   04/09/2023   Follow up in 10 weeks All questions were answered. The patient knows to call the clinic with any problems, questions or concerns.  Allen Server, MD, PhD [facility] Hematology Oncology 04/08/2022     HISTORY OF PRESENTING ILLNESS:  Allen Chang 89 y.o.  male with PMH listed as below who was referred by primary care provider to me for evaluation of leukocytosis. He has a history of prostate cancer which was diagnosed 10 years ago. He underwent brachytherapy. He is not any castration treatment. Per patient, he follows up with Allen Chang and most recent PSA is normal.   Patient also reports feeling fatigue lately. Recent labs show leukocytosis, mild anemia, macrocytosis, mild thrombocytopenia, low ferritin. Denies blood in the stool. He was started on over the counter iron  supplement but wife who manages patient's medication decides to only let patient to take iron medication every other day.   # received IV iron Venofer x 4. Colonoscopy was done on 02/16/2017 which showed polyps, internal hemorrhoids, negative for malignancy.  ##Patient had bone marrow biopsy on 09/22/2018 Pathology showed hypercellular bone marrow with extensive involvement by chronic lymphocytic leukemia. # Started on acalabrutinib 100 mg twice daily on 10/01/2018. 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  #August 2020-acalabrutinib 100 mg twice daily Stopped in October 2021 during his admission.  # admitted from 12/16/19- 10/30/ 2021 due to sudden onset chest pain. Troponins were negative in the emergency room.  Initial EKG showed atrial fibrillation with heart rate of 55. Patient was seen by cardiology and clarified that the EKG and telemetry actually showed sinus rhythm with PACs patient did not require any anticoagulation.Echocardiogram showed normal EF and no wall motion abnormality.  Nuclear stress testing showed low risk study, EF 63%. Patient was discharged home and followed up with Dr. Clayborn Chang on January 26, 2020 October 2021 -Dec 2023 off Acalabrutinib  03/04/2021 started on acalabrutinib 175m BID    INTERVAL HISTORY Allen Chang a 89y.o. male who has above history reviewed by me presents for 4 months follow up for CLL and iron deficiency anemia, MGUS Today patient was accompanied by his wife.  Patient reports feeling well.  Appetite is good.   Also subjectively he feels stronger. Night sweats also has improved.  He has gained 4 pounds since last visit.  Review of Systems  Constitutional:  Positive for  appetite change and unexpected weight change. Negative for chills, fatigue and fever.  HENT:   Negative for hearing loss and voice change.   Eyes:  Negative for eye problems and icterus.  Respiratory:  Negative for chest tightness, cough and shortness  of breath.   Cardiovascular:  Negative for chest pain and leg swelling.  Gastrointestinal:  Negative for abdominal distention and abdominal pain.  Endocrine: Negative for hot flashes.  Genitourinary:  Negative for difficulty urinating, dysuria and frequency.   Musculoskeletal:  Positive for arthralgias. Negative for back pain.  Skin:  Negative for itching and rash.       Skin cancer  Neurological:  Negative for light-headedness and numbness.  Hematological:  Negative for adenopathy. Does not bruise/bleed easily.  Psychiatric/Behavioral:  Negative for confusion.     MEDICAL HISTORY:  Past Medical History:  Diagnosis Date   Cancer [facility])    prostate    CKD (chronic kidney disease) stage 3, GFR 30-59 ml/min (HCC) 0000000   Complication of anesthesia    bradycardia, in ICU for 24 hour after galbladder surgery.    Diverticulosis 2012   Dysrhythmia    Heart skips a beat   Elevated lipids    GERD (gastroesophageal reflux disease)    History of hiatal hernia    Hypertension    Hypothyroidism    Leukemia (HCC)    Prostate cancer (HCC)    Skin cancer    face, scalp, behind ear,back and hand   Tremors of nervous system     SURGICAL HISTORY: Past Surgical History:  Procedure Laterality Date   BLADDER TUMOR EXCISION     CARDIAC CATHETERIZATION     CATARACT EXTRACTION W/PHACO Right 01/16/2022   Procedure: CATARACT EXTRACTION PHACO AND INTRAOCULAR LENS PLACEMENT ([place]) RIGHT DIABETIC KAHOOK DUAL BLADE GONIOTOMY 14.34 01:38.4;  Surgeon: Leandrew Koyanagi, MD;  Location: [place];  Service: Ophthalmology;  Laterality: Right;   CATARACT EXTRACTION W/PHACO Left 01/29/2022   Procedure: CATARACT EXTRACTION PHACO AND INTRAOCULAR LENS PLACEMENT ([place]) LEFT DIABETIC KAHOOK DUAL BLADE GONIOTOMY MALYUGIN  12.91  01:31.0;  Surgeon: Leandrew Koyanagi, MD;  Location: [place];  Service: Ophthalmology;  Laterality: Left;   CHOLECYSTECTOMY N/A 10/10/2014   Procedure:  LAPAROSCOPIC CHOLECYSTECTOMY WITH INTRAOPERATIVE CHOLANGIOGRAM;  Surgeon: Leonie Green, MD;  Location: [facility];  Service: General;  Laterality: N/A;   COLONOSCOPY WITH PROPOFOL N/A 02/16/2017   Procedure: COLONOSCOPY WITH PROPOFOL;  Surgeon: Manya Silvas, MD;  Location: [facility] ENDOSCOPY;  Service: Endoscopy;  Laterality: N/A;   ESOPHAGOGASTRODUODENOSCOPY (EGD) WITH PROPOFOL N/A 02/16/2017   Procedure: ESOPHAGOGASTRODUODENOSCOPY (EGD) WITH PROPOFOL;  Surgeon: Manya Silvas, MD;  Location: [facility] ENDOSCOPY;  Service: Endoscopy;  Laterality: N/A;   HEMORRHOID SURGERY     HERNIA REPAIR Left    x2   JOINT REPLACEMENT Left    Partial Knee Replacement, Dr. Roland Rack   PARTIAL KNEE ARTHROPLASTY Left 12/12/2014   Procedure: UNICOMPARTMENTAL KNEE;  Surgeon: Corky Mull, MD;  Location: [facility];  Service: Orthopedics;  Laterality: Left;   PARTIAL KNEE ARTHROPLASTY Right 09/25/2020   Procedure: RIGHT PARTIAL KNEE REPLACEMENT;  Surgeon: Corky Mull, MD;  Location: [facility];  Service: Orthopedics;  Laterality: Right;   prostate seeding     SHOULDER ARTHROSCOPY Right    SHOULDER ARTHROSCOPY WITH OPEN ROTATOR CUFF REPAIR Left 02/07/2016   Procedure: SHOULDER ARTHROSCOPY WITH OPEN ROTATOR CUFF REPAIR;  Surgeon: Corky Mull, MD;  Location: [facility];  Service: Orthopedics;  Laterality: Left;    SOCIAL  HISTORY: Social History   Socioeconomic History   Marital status: Married    Spouse name: Not on file   Number of children: Not on file   Years of education: Not on file   Highest education level: Not on file  Occupational History   Not on file  Tobacco Use   Smoking status: Never   Smokeless tobacco: Never  Vaping Use   Vaping Use: Never used  Substance and Sexual Activity   Alcohol use: No   Drug use: No   Sexual activity: Yes  Other Topics Concern   Not on file  Social History Narrative   Not on file   Social Determinants of Health   Financial Resource Strain: Not on file   Food Insecurity: Not on file  Transportation Needs: Not on file  Physical Activity: Not on file  Stress: Not on file  Social Connections: Not on file  Intimate Partner Violence: Not on file    FAMILY HISTORY: Family History  Problem Relation Age of Onset   Bone cancer Father    Hypertension Mother    Osteoporosis Mother    Breast cancer Sister    Cancer Brother    Cancer Brother    Lung cancer Brother    Bladder Cancer Brother    Melanoma Brother     ALLERGIES:  is allergic to oxycodone-acetaminophen and voltaren [diclofenac sodium].  MEDICATIONS:  Current Outpatient Medications  Medication Sig Dispense Refill   acalabrutinib maleate (CALQUENCE) 100 MG tablet Take 1 tablet (100 mg total) by mouth 2 (two) times daily. 60 tablet 2   acetaminophen (TYLENOL) 500 MG tablet Take 1,000 mg by mouth every 8 (eight) hours as needed for moderate pain.     ascorbic acid (VITAMIN C) 1000 MG tablet Take 1,000 mg by mouth daily.     b complex vitamins capsule Take 1 capsule by mouth daily.     calcium carbonate (OSCAL) 1500 (600 Ca) MG TABS tablet Take 1,500 mg by mouth daily with breakfast.     Cholecalciferol 25 MCG (1000 UT) tablet Take 1,000 Units by mouth daily.     cyanocobalamin 100 MCG tablet Take 100 mcg by mouth daily.     donepezil (ARICEPT) 10 MG tablet Take 10 mg by mouth at bedtime.     DULoxetine (CYMBALTA) 20 MG capsule Take 20 mg by mouth daily.     famotidine (PEPCID) 40 MG tablet Take 40 mg by mouth 2 (two) times daily.     ferrous sulfate 325 (65 FE) MG EC tablet Take 325 mg by mouth 3 (three) times daily with meals.     Flaxseed, Linseed, (FLAX SEED OIL) 1000 MG CAPS Take 1,000 mg by mouth daily.     fluorouracil (EFUDEX) 5 % cream Apply to the back of your right hand twice per day for 5 to 7 days. STOP when the area becomes red and irritated even if you haven't done the full 7 days.     furosemide (LASIX) 20 MG tablet Take 20 mg by mouth daily.     gabapentin  (NEURONTIN) 100 MG capsule Take 1 capsule by mouth at bedtime.     Garlic 123XX123 MG CAPS Take 1 capsule by mouth every morning.     Ginkgo Biloba (GINKOBA PO) Take 2 tablets by mouth daily.     latanoprost (XALATAN) 0.005 % ophthalmic solution Place 1 drop into both eyes at bedtime.     levothyroxine (SYNTHROID) 50 MCG tablet Take 50 mcg by mouth  daily before breakfast. Take on an empty stomach with a glass of water at least 30-60 minutes before breakfast.     loratadine (CLARITIN REDITABS) 10 MG dissolvable tablet Take 10 mg by mouth daily. In am     Misc Natural Products (BLACK CHERRY CONCENTRATE) LIQD Take 15 mLs by mouth daily.      MISC NATURAL PRODUCTS PO Take 2 capsules by mouth daily. Beet juice capsules     Omega-3 Fatty Acids (FISH OIL) 1000 MG CAPS Take 1 capsule by mouth daily.     OVER THE COUNTER MEDICATION Take 1 capsule by mouth daily. Neuriva     OVER THE COUNTER MEDICATION Take 1 capsule by mouth daily. Ageless Brain     OVER THE COUNTER MEDICATION Take 1,000 mg by mouth daily. BACOPA     pantoprazole (PROTONIX) 40 MG tablet Take 40 mg by mouth 2 (two) times daily.     primidone (MYSOLINE) 50 MG tablet Take 50 mg by mouth 4 (four) times daily.     propranolol (INDERAL) 10 MG tablet Take 10 mg by mouth 2 (two) times daily.     vitamin E 180 MG (400 UNITS) capsule Take 400 Units by mouth daily.     zinc gluconate 50 MG tablet Take 50 mg by mouth daily.     albuterol (VENTOLIN HFA) 108 (90 Base) MCG/ACT inhaler Inhale 2 puffs into the lungs every 6 (six) hours as needed for wheezing or shortness of breath. (Patient not taking: Reported on 01/01/2022) 8 g 0   No current facility-administered medications for this visit.      Marland Kitchen  PHYSICAL EXAMINATION: ECOG PERFORMANCE STATUS: 1 - Symptomatic but completely ambulatory Vitals:   04/08/22 1014  BP: 114/63  Pulse: 62  Resp: 18  Temp: (!) 96 F (35.6 C)  SpO2: 100%   Filed Weights   04/08/22 1014  Weight: 166 lb 4.8 oz (75.4  kg)    Physical Exam Constitutional:      General: He is not in acute distress.    Appearance: He is not diaphoretic.     Comments: Patient ambulates independently  HENT:     Head: Normocephalic and atraumatic.     Nose: Nose normal.     Mouth/Throat:     Pharynx: No oropharyngeal exudate.  Eyes:     General: No scleral icterus.    Pupils: Pupils are equal, round, and reactive to light.  Cardiovascular:     Rate and Rhythm: Normal rate.     Heart sounds: No murmur heard. Pulmonary:     Effort: Pulmonary effort is normal. No respiratory distress.     Breath sounds: No rales.  Chest:     Chest wall: No tenderness.  Abdominal:     General: There is no distension.     Palpations: Abdomen is soft.     Tenderness: There is no abdominal tenderness.  Musculoskeletal:        General: Normal range of motion.     Cervical back: Normal range of motion and neck supple.     Comments: Status post right knee replacement  Skin:    General: Skin is warm and dry.  Neurological:     Mental Status: He is alert and oriented to person, place, and time.  Psychiatric:        Mood and Affect: Affect normal.   .    LABORATORY DATA:  I have reviewed the data as listed    Latest Ref Rng & Units  04/08/2022    9:49 AM 03/13/2022   10:57 AM 02/20/2022   12:53 PM  CBC  WBC 4.0 - 10.5 K/uL 29.6  50.8  73.7   Hemoglobin 13.0 - 17.0 g/dL 13.3  12.2  12.0   Hematocrit 39.0 - 52.0 % 41.6  38.5  37.0   Platelets 150 - 400 K/uL 104  111  102       Latest Ref Rng & Units 04/08/2022    9:49 AM 02/20/2022   12:53 PM 01/28/2022    9:41 AM  CMP  Glucose 70 - 99 mg/dL 107  92  79   BUN 8 - 23 mg/dL 35  42  42   Creatinine 0.61 - 1.24 mg/dL 1.13  1.13  1.12   Sodium 135 - 145 mmol/L 141  140  142   Potassium 3.5 - 5.1 mmol/L 3.7  4.1  4.0   Chloride 98 - 111 mmol/L 107  104  105   CO2 22 - 32 mmol/L 25  28  28   $ Calcium 8.9 - 10.3 mg/dL 9.1  9.2  9.6   Total Protein 6.5 - 8.1 g/dL 7.3  7.0  7.5    Total Bilirubin 0.3 - 1.2 mg/dL 0.6  0.6  0.6   Alkaline Phos 38 - 126 U/L 57  58  67   AST 15 - 41 U/L 22  19  20   $ ALT 0 - 44 U/L 23  24  22     $ Chronic lymphocytic leukemia, B cell, CD38 positive (78%).  Cytogenetics revealed Trisomy 12 IGVH unmutated.  RADIOGRAPHIC STUDIES: I have personally reviewed the radiological images as listed and agreed with the findings in the report. no recent images. 11/04/2017  US abdomen Stable right renal cyst. Status post cholecystectomy.Previously seen lesion within the liver is not well appreciated on this exam. No Splenomegaly.

## 2022-04-08 NOTE — Assessment & Plan Note (Addendum)
CLL, intermediate risk with trisomy 41. IgVH unmutated. Stage III, extensive bone marrow involvement. Labs are reviewed and discussed with patient. WBC is trending down.  Patient tolerates treatment well. Continue acalabrutinib 100 mg twice daily

## 2022-04-08 NOTE — Assessment & Plan Note (Signed)
Stable counts. monitor

## 2022-04-08 NOTE — Assessment & Plan Note (Signed)
Labs are reviewed and discussed with patient. Stable M protein and normal light chain ration.  Continue observation. Repeat labs every 6 months.

## 2022-04-23 ENCOUNTER — Other Ambulatory Visit: Payer: Self-pay | Admitting: Pharmacist

## 2022-04-23 DIAGNOSIS — C9110 Chronic lymphocytic leukemia of B-cell type not having achieved remission: Secondary | ICD-10-CM

## 2022-04-23 MED ORDER — ACALABRUTINIB MALEATE 100 MG PO TABS: 100.0000 mg | ORAL_TABLET | Freq: Two times a day (BID) | ORAL | 2 refills | Status: DC

## 2022-05-06 ENCOUNTER — Encounter: Payer: Self-pay | Admitting: Oncology

## 2022-05-07 ENCOUNTER — Other Ambulatory Visit: Payer: Self-pay

## 2022-05-07 DIAGNOSIS — C9110 Chronic lymphocytic leukemia of B-cell type not having achieved remission: Secondary | ICD-10-CM

## 2022-05-07 DIAGNOSIS — D539 Nutritional anemia, unspecified: Secondary | ICD-10-CM

## 2022-05-07 NOTE — Telephone Encounter (Signed)
Please contact daughter, Anderson Malta, with appt details.   Labs (per pt availability) MRI brain- first availa

## 2022-05-08 ENCOUNTER — Other Ambulatory Visit: Payer: Self-pay

## 2022-05-08 DIAGNOSIS — C9110 Chronic lymphocytic leukemia of B-cell type not having achieved remission: Secondary | ICD-10-CM

## 2022-05-08 DIAGNOSIS — R410 Disorientation, unspecified: Secondary | ICD-10-CM

## 2022-05-09 ENCOUNTER — Other Ambulatory Visit: Payer: Self-pay

## 2022-05-12 ENCOUNTER — Inpatient Hospital Stay: Payer: Medicare Other

## 2022-05-12 ENCOUNTER — Inpatient Hospital Stay: Payer: Medicare Other | Attending: Oncology

## 2022-05-12 ENCOUNTER — Encounter: Payer: Self-pay | Admitting: Oncology

## 2022-05-12 DIAGNOSIS — C9110 Chronic lymphocytic leukemia of B-cell type not having achieved remission: Secondary | ICD-10-CM | POA: Diagnosis present

## 2022-05-12 DIAGNOSIS — Z79890 Hormone replacement therapy (postmenopausal): Secondary | ICD-10-CM | POA: Diagnosis not present

## 2022-05-12 DIAGNOSIS — Z79899 Other long term (current) drug therapy: Secondary | ICD-10-CM | POA: Diagnosis not present

## 2022-05-12 DIAGNOSIS — E039 Hypothyroidism, unspecified: Secondary | ICD-10-CM | POA: Diagnosis not present

## 2022-05-12 DIAGNOSIS — D539 Nutritional anemia, unspecified: Secondary | ICD-10-CM

## 2022-05-12 LAB — VITAMIN B12: Vitamin B-12: 3376 pg/mL — ABNORMAL HIGH (ref 180–914)

## 2022-05-12 LAB — TSH: TSH: 4.779 u[IU]/mL — ABNORMAL HIGH (ref 0.350–4.500)

## 2022-05-19 ENCOUNTER — Ambulatory Visit
Admission: RE | Admit: 2022-05-19 | Discharge: 2022-05-19 | Disposition: A | Discharge status: Home / Self Care | Payer: Medicare Other | Source: Ambulatory Visit | Attending: Oncology | Admitting: Oncology

## 2022-05-19 DIAGNOSIS — C9110 Chronic lymphocytic leukemia of B-cell type not having achieved remission: Secondary | ICD-10-CM | POA: Diagnosis present

## 2022-05-19 DIAGNOSIS — R410 Disorientation, unspecified: Secondary | ICD-10-CM | POA: Diagnosis present

## 2022-05-19 MED ORDER — GADOBUTROL 1 MMOL/ML IV SOLN
7.5000 mL | Freq: Once | INTRAVENOUS | Status: AC | PRN
  Administered 2022-05-19: 7.5 mL via INTRAVENOUS

## 2022-05-23 ENCOUNTER — Encounter: Payer: Self-pay | Admitting: Oncology

## 2022-06-07 ENCOUNTER — Encounter: Payer: Self-pay | Admitting: Oncology

## 2022-06-17 ENCOUNTER — Inpatient Hospital Stay: Payer: Medicare Other | Attending: Oncology

## 2022-06-17 ENCOUNTER — Encounter: Payer: Self-pay | Admitting: Oncology

## 2022-06-17 ENCOUNTER — Inpatient Hospital Stay (HOSPITAL_BASED_OUTPATIENT_CLINIC_OR_DEPARTMENT_OTHER): Payer: Medicare Other | Admitting: Oncology

## 2022-06-17 VITALS — BP 122/63 | HR 74 | Temp 96.0°F | Resp 18 | Wt 163.9 lb

## 2022-06-17 DIAGNOSIS — N1831 Chronic kidney disease, stage 3a: Secondary | ICD-10-CM | POA: Insufficient documentation

## 2022-06-17 DIAGNOSIS — Z5111 Encounter for antineoplastic chemotherapy: Secondary | ICD-10-CM | POA: Diagnosis not present

## 2022-06-17 DIAGNOSIS — C9110 Chronic lymphocytic leukemia of B-cell type not having achieved remission: Secondary | ICD-10-CM | POA: Diagnosis not present

## 2022-06-17 DIAGNOSIS — Z79899 Other long term (current) drug therapy: Secondary | ICD-10-CM | POA: Insufficient documentation

## 2022-06-17 DIAGNOSIS — D509 Iron deficiency anemia, unspecified: Secondary | ICD-10-CM | POA: Insufficient documentation

## 2022-06-17 DIAGNOSIS — D696 Thrombocytopenia, unspecified: Secondary | ICD-10-CM | POA: Insufficient documentation

## 2022-06-17 DIAGNOSIS — D472 Monoclonal gammopathy: Secondary | ICD-10-CM | POA: Diagnosis not present

## 2022-06-17 DIAGNOSIS — E1122 Type 2 diabetes mellitus with diabetic chronic kidney disease: Secondary | ICD-10-CM | POA: Diagnosis not present

## 2022-06-17 DIAGNOSIS — I129 Hypertensive chronic kidney disease with stage 1 through stage 4 chronic kidney disease, or unspecified chronic kidney disease: Secondary | ICD-10-CM | POA: Diagnosis not present

## 2022-06-17 LAB — COMPREHENSIVE METABOLIC PANEL
ALT: 22 U/L (ref 0–44)
AST: 24 U/L (ref 15–41)
Albumin: 3.8 g/dL (ref 3.5–5.0)
Alkaline Phosphatase: 48 U/L (ref 38–126)
Anion gap: 8 (ref 5–15)
BUN: 30 mg/dL — ABNORMAL HIGH (ref 8–23)
CO2: 28 mmol/L (ref 22–32)
Calcium: 9.4 mg/dL (ref 8.9–10.3)
Chloride: 103 mmol/L (ref 98–111)
Creatinine, Ser: 1.12 mg/dL (ref 0.61–1.24)
GFR, Estimated: >60 mL/min (ref >60)
Glucose, Bld: 91 mg/dL (ref 70–99)
Potassium: 3.8 mmol/L (ref 3.5–5.1)
Sodium: 139 mmol/L (ref 135–145)
Total Bilirubin: 0.7 mg/dL (ref 0.3–1.2)
Total Protein: 7 g/dL (ref 6.5–8.1)

## 2022-06-17 LAB — CBC WITH DIFFERENTIAL (CANCER CENTER ONLY)
Abs Immature Granulocytes: 0.02 10*3/uL (ref 0.00–0.07)
Basophils Absolute: 0.1 10*3/uL (ref 0.0–0.1)
Basophils Relative: 0 %
Eosinophils Absolute: 0.1 10*3/uL (ref 0.0–0.5)
Eosinophils Relative: 1 %
HCT: 41.2 % (ref 39.0–52.0)
Hemoglobin: 13.6 g/dL (ref 13.0–17.0)
Immature Granulocytes: 0 %
Lymphocytes Relative: 73 %
Lymphs Abs: 8.9 10*3/uL — ABNORMAL HIGH (ref 0.7–4.0)
MCH: 34.8 pg — ABNORMAL HIGH (ref 26.0–34.0)
MCHC: 33 g/dL (ref 30.0–36.0)
MCV: 105.4 fL — ABNORMAL HIGH (ref 80.0–100.0)
Monocytes Absolute: 0.6 10*3/uL (ref 0.1–1.0)
Monocytes Relative: 5 %
Neutro Abs: 2.5 10*3/uL (ref 1.7–7.7)
Neutrophils Relative %: 21 %
Platelet Count: 108 10*3/uL — ABNORMAL LOW (ref 150–400)
RBC: 3.91 MIL/uL — ABNORMAL LOW (ref 4.22–5.81)
RDW: 12.8 % (ref 11.5–15.5)
Smear Review: DECREASED
WBC Count: 12 10*3/uL — ABNORMAL HIGH (ref 4.0–10.5)
nRBC: 0 % (ref 0.0–0.2)

## 2022-06-17 LAB — LACTATE DEHYDROGENASE: LDH: 93 U/L — ABNORMAL LOW (ref 98–192)

## 2022-06-17 NOTE — Progress Notes (Signed)
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 Stage III, extensive bone marrow involvement. Labs are reviewed and discussed with patient. WBC is trending down.  Patient tolerates treatment well. Continue acalabrutinib 100 mg twice daily  Thrombocytopenia (HCC) Stable counts. monitor  Encounter for antineoplastic chemotherapy Treatment as planned above.  MGUS (monoclonal gammopathy of unknown significance) Labs are reviewed and discussed with patient. Stable M protein and normal light chain ration.  Continue observation. Repeat labs every 6 months.   Chronic kidney disease (CKD) stage G3a/A1, moderately decreased glomerular filtration rate (GFR) between 45-59 mL/min/1.73 square meter and albuminuria creatinine ratio less than 30 mg/g (HCC) avoid nephrotoxins.  Encourage oral hydration.  Orders Placed This Encounter  Procedures   CBC with Differential (Cancer Center Only)    Standing Status:   Future    Standing Expiration Date:   06/17/2023   CMP (Cancer Center only)    Standing Status:   Future    Standing Expiration Date:   06/17/2023   Lactate dehydrogenase    Standing Status:   Future    Standing Expiration Date:   06/17/2023   Follow up in  3 months All questions were answered. The patient knows to call the clinic with any problems, questions or concerns.  Rickard Patience, MD, PhD [facility] Hematology Oncology 06/17/2022     HISTORY OF PRESENTING ILLNESS:  Allen Chang 89 y.o.  male with PMH listed as below who was referred by primary care provider to me for evaluation of leukocytosis. He has a history of prostate cancer which was diagnosed 10 years ago. He underwent brachytherapy. He is not any castration treatment. Per patient, he follows up with Dr.Wolf and most  recent PSA is normal.   Patient also reports feeling fatigue lately. Recent labs show leukocytosis, mild anemia, macrocytosis, mild thrombocytopenia, low ferritin. Denies blood in the stool. He was started on over the counter iron supplement but wife who manages patient's medication decides to only let patient to take iron medication every other day.   # received IV iron Venofer x 4. Colonoscopy was done on 02/16/2017 which showed polyps, internal hemorrhoids, negative for malignancy.  ##Patient had bone marrow biopsy on 09/22/2018 Pathology showed hypercellular bone marrow with extensive involvement by chronic lymphocytic leukemia. # Started on acalabrutinib 100 mg twice daily on 10/01/2018. 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  #August 2020-acalabrutinib 100 mg twice daily Stopped in October 2021 during his admission.  # admitted from 12/16/19- 10/30/ 2021 due to sudden onset chest pain. Troponins were negative in the emergency room.  Initial EKG showed atrial fibrillation with heart rate of 55. Patient was seen by cardiology and clarified that the EKG and telemetry actually showed sinus rhythm with PACs patient did not require any anticoagulation.Echocardiogram showed normal EF and no wall motion abnormality.  Nuclear stress testing showed low risk study, EF 63%. Patient was discharged home and followed up with Dr. Juliann Pares on January 26, 2020 October 2021 -Dec 2023 off Acalabrutinib  03/04/2021 started on acalabrutinib 100mg  BID    INTERVAL HISTORY Allen Chang is a 89 y.o. male who has above history reviewed by me presents for 4 months follow up for CLL and iron deficiency anemia, MGUS Today patient was accompanied by his wife.  Patient reports feeling  well.  Appetite is good.    Denies weight loss, fever, chills, fatigue, night sweats.  Occasional he has left side abdominal pain. Sometimes constipated  Review of Systems  Constitutional:  Negative for  appetite change, chills, fatigue, fever and unexpected weight change.  HENT:   Negative for hearing loss and voice change.   Eyes:  Negative for eye problems and icterus.  Respiratory:  Negative for chest tightness, cough and shortness of breath.   Cardiovascular:  Negative for chest pain and leg swelling.  Gastrointestinal:  Positive for constipation. Negative for abdominal distention and abdominal pain.  Endocrine: Negative for hot flashes.  Genitourinary:  Negative for difficulty urinating, dysuria and frequency.   Musculoskeletal:  Positive for arthralgias. Negative for back pain.  Skin:  Negative for itching and rash.       Skin cancer  Neurological:  Negative for light-headedness and numbness.  Hematological:  Negative for adenopathy. Does not bruise/bleed easily.  Psychiatric/Behavioral:  Negative for confusion.     MEDICAL HISTORY:  Past Medical History:  Diagnosis Date   Cancer [facility])    prostate    CKD (chronic kidney disease) stage 3, GFR 30-59 ml/min (HCC) 10/01/2016   Complication of anesthesia    bradycardia, in ICU for 24 hour after galbladder surgery.    Diverticulosis 2012   Dysrhythmia    Heart skips a beat   Elevated lipids    GERD (gastroesophageal reflux disease)    History of hiatal hernia    Hypertension    Hypothyroidism    Leukemia (HCC)    Prostate cancer (HCC)    Skin cancer    face, scalp, behind ear,back and hand   Tremors of nervous system     SURGICAL HISTORY: Past Surgical History:  Procedure Laterality Date   BLADDER TUMOR EXCISION     CARDIAC CATHETERIZATION     CATARACT EXTRACTION W/PHACO Right 01/16/2022   Procedure: CATARACT EXTRACTION PHACO AND INTRAOCULAR LENS PLACEMENT (IOC) RIGHT DIABETIC KAHOOK DUAL BLADE GONIOTOMY 14.34 01:38.4;  Surgeon: Lockie Mola, MD;  Location: [facility];  Service: Ophthalmology;  Laterality: Right;   CATARACT EXTRACTION W/PHACO Left 01/29/2022   Procedure: CATARACT EXTRACTION PHACO AND  INTRAOCULAR LENS PLACEMENT (IOC) LEFT DIABETIC KAHOOK DUAL BLADE GONIOTOMY MALYUGIN  12.91  01:31.0;  Surgeon: Lockie Mola, MD;  Location: [facility];  Service: Ophthalmology;  Laterality: Left;   CHOLECYSTECTOMY N/A 10/10/2014   Procedure: LAPAROSCOPIC CHOLECYSTECTOMY WITH INTRAOPERATIVE CHOLANGIOGRAM;  Surgeon: Nadeen Landau, MD;  Location: [facility] ORS;  Service: General;  Laterality: N/A;   COLONOSCOPY WITH PROPOFOL N/A 02/16/2017   Procedure: COLONOSCOPY WITH PROPOFOL;  Surgeon: Scot Jun, MD;  Location: [facility] END[facility];  Service: Endoscopy;  Laterality: N/A;   ESOPHAGOGASTRODUODENOSCOPY (EGD) WITH PROPOFOL N/A 02/16/2017   Procedure: ESOPHAGOGASTRODUODENOSCOPY (EGD) WITH PROPOFOL;  Surgeon: Scot Jun, MD;  Location: [facility] ENDOSCOPY;  Service: Endoscopy;  Laterality: N/A;   HEMORRHOID SURGERY     HERNIA REPAIR Left    x2   JOINT REPLACEMENT Left    Partial Knee Replacement, Dr. Joice Lofts   PARTIAL KNEE ARTHROPLASTY Left 12/12/2014   Procedure: UNICOMPARTMENTAL KNEE;  Surgeon: Christena Flake, MD;  Location: [facility] ORS;  Service: Orthopedics;  Laterality: Left;   PARTIAL KNEE ARTHROPLASTY Right 09/25/2020   Procedure: RIGHT PARTIAL KNEE REPLACEMENT;  Surgeon: Christena Flake, MD;  Location: [facility] ORS;  Service: Orthopedics;  Laterality: Right;   prostate seeding     SHOULDER ARTHROSCOPY Right    SHOULDER ARTHROSCOPY WITH OPEN  ROTATOR CUFF REPAIR Left 02/07/2016   Procedure: SHOULDER ARTHROSCOPY WITH OPEN ROTATOR CUFF REPAIR;  Surgeon: Christena Flake, MD;  Location: [facility] ORS;  Service: Orthopedics;  Laterality: Left;    SOCIAL HISTORY: Social History   Socioeconomic History   Marital status: Married    Spouse name: Not on file   Number of children: Not on file   Years of education: Not on file   Highest education level: Not on file  Occupational History   Not on file  Tobacco Use   Smoking status: Never   Smokeless tobacco: Never  Vaping Use   Vaping Use:  Never used  Substance and Sexual Activity   Alcohol use: No   Drug use: No   Sexual activity: Yes  Other Topics Concern   Not on file  Social History Narrative   Not on file   Social Determinants of Health   Financial Resource Strain: Not on file  Food Insecurity: Not on file  Transportation Needs: Not on file  Physical Activity: Not on file  Stress: Not on file  Social Connections: Not on file  Intimate Partner Violence: Not on file    FAMILY HISTORY: Family History  Problem Relation Age of Onset   Bone cancer Father    Hypertension Mother    Osteoporosis Mother    Breast cancer Sister    Cancer Brother    Cancer Brother    Lung cancer Brother    Bladder Cancer Brother    Melanoma Brother     ALLERGIES:  is allergic to oxycodone-acetaminophen and voltaren [diclofenac sodium].  MEDICATIONS:  Current Outpatient Medications  Medication Sig Dispense Refill   acetaminophen (TYLENOL) 500 MG tablet Take 1,000 mg by mouth every 8 (eight) hours as needed for moderate pain.     ascorbic acid (VITAMIN C) 1000 MG tablet Take 1,000 mg by mouth daily.     b complex vitamins capsule Take 1 capsule by mouth daily.     calcium carbonate (OSCAL) 1500 (600 Ca) MG TABS tablet Take 1,500 mg by mouth daily with breakfast.     Cholecalciferol 25 MCG (1000 UT) tablet Take 1,000 Units by mouth daily.     donepezil (ARICEPT) 10 MG tablet Take 10 mg by mouth at bedtime.     DULoxetine (CYMBALTA) 20 MG capsule Take 20 mg by mouth daily.     famotidine (PEPCID) 40 MG tablet Take 40 mg by mouth 2 (two) times daily.     ferrous sulfate 325 (65 FE) MG EC tablet Take 325 mg by mouth 3 (three) times daily with meals.     Flaxseed, Linseed, (FLAX SEED OIL) 1000 MG CAPS Take 1,000 mg by mouth daily.     fluorouracil (EFUDEX) 5 % cream Apply to the back of your right hand twice per day for 5 to 7 days. STOP when the area becomes red and irritated even if you haven't done the full 7 days.      furosemide (LASIX) 20 MG tablet Take 20 mg by mouth daily.     gabapentin (NEURONTIN) 100 MG capsule Take 1 capsule by mouth at bedtime.     Garlic 1000 MG CAPS Take 1 capsule by mouth every morning.     Ginkgo Biloba (GINKOBA PO) Take 2 tablets by mouth daily.     latanoprost (XALATAN) 0.005 % ophthalmic solution Place 1 drop into both eyes at bedtime.     levothyroxine (SYNTHROID) 50 MCG tablet Take 50 mcg by mouth daily before  breakfast. Take on an empty stomach with a glass of water at least 30-60 minutes before breakfast.     loratadine (CLARITIN REDITABS) 10 MG dissolvable tablet Take 10 mg by mouth daily. In am     Misc Natural Products (BLACK CHERRY CONCENTRATE) LIQD Take 15 mLs by mouth daily.      MISC NATURAL PRODUCTS PO Take 2 capsules by mouth daily. Beet juice capsules     Omega-3 Fatty Acids (FISH OIL) 1000 MG CAPS Take 1 capsule by mouth daily.     OVER THE COUNTER MEDICATION Take 1 capsule by mouth daily. Neuriva     OVER THE COUNTER MEDICATION Take 1 capsule by mouth daily. Ageless Brain     OVER THE COUNTER MEDICATION Take 1,000 mg by mouth daily. BACOPA     pantoprazole (PROTONIX) 40 MG tablet Take 40 mg by mouth 2 (two) times daily.     propranolol (INDERAL) 10 MG tablet Take 10 mg by mouth 2 (two) times daily.     vitamin E 180 MG (400 UNITS) capsule Take 400 Units by mouth daily.     zinc gluconate 50 MG tablet Take 50 mg by mouth daily.     acalabrutinib maleate (CALQUENCE) 100 MG tablet Take 1 tablet (100 mg total) by mouth 2 (two) times daily. 60 tablet 2   albuterol (VENTOLIN HFA) 108 (90 Base) MCG/ACT inhaler Inhale 2 puffs into the lungs every 6 (six) hours as needed for wheezing or shortness of breath. (Patient not taking: Reported on 01/01/2022) 8 g 0   cyanocobalamin 100 MCG tablet Take 100 mcg by mouth daily. (Patient not taking: Reported on 06/17/2022)     No current facility-administered medications for this visit.      Marland Kitchen  PHYSICAL EXAMINATION: ECOG  PERFORMANCE STATUS: 1 - Symptomatic but completely ambulatory Vitals:   06/17/22 1013  BP: 122/63  Pulse: 74  Resp: 18  Temp: (!) 96 F (35.6 C)  SpO2: 100%   Filed Weights   06/17/22 1013  Weight: 163 lb 14.4 oz (74.3 kg)    Physical Exam Constitutional:      General: He is not in acute distress.    Appearance: He is not diaphoretic.     Comments: Patient ambulates independently  HENT:     Head: Normocephalic and atraumatic.     Nose: Nose normal.     Mouth/Throat:     Pharynx: No oropharyngeal exudate.  Eyes:     General: No scleral icterus.    Pupils: Pupils are equal, round, and reactive to light.  Cardiovascular:     Rate and Rhythm: Normal rate.     Heart sounds: No murmur heard. Pulmonary:     Effort: Pulmonary effort is normal. No respiratory distress.     Breath sounds: No rales.  Chest:     Chest wall: No tenderness.  Abdominal:     General: There is no distension.     Palpations: Abdomen is soft.     Tenderness: There is no abdominal tenderness.  Musculoskeletal:        General: Normal range of motion.     Cervical back: Normal range of motion and neck supple.     Comments: Status post right knee replacement  Skin:    General: Skin is warm and dry.  Neurological:     Mental Status: He is alert and oriented to person, place, and time.  Psychiatric:        Mood and Affect: Affect normal.   .  LABORATORY DATA:  I have reviewed the data as listed    Latest Ref Rng & Units 06/17/2022    9:52 AM 04/08/2022    9:49 AM 03/13/2022   10:57 AM  CBC  WBC 4.0 - 10.5 K/uL 12.0  29.6  50.8   Hemoglobin 13.0 - 17.0 g/dL 16.1  09.6  04.5   Hematocrit 39.0 - 52.0 % 41.2  41.6  38.5   Platelets 150 - 400 K/uL 108  104  111       Latest Ref Rng & Units 06/17/2022    9:52 AM 04/08/2022    9:49 AM 02/20/2022   12:53 PM  CMP  Glucose 70 - 99 mg/dL 91  409  92   BUN 8 - 23 mg/dL 30  35  42   Creatinine 0.61 - 1.24 mg/dL 8.11  9.14  7.82   Sodium 135 - 145  mmol/L 139  141  140   Potassium 3.5 - 5.1 mmol/L 3.8  3.7  4.1   Chloride 98 - 111 mmol/L 103  107  104   CO2 22 - 32 mmol/L 28  25  28    Calcium 8.9 - 10.3 mg/dL 9.4  9.1  9.2   Total Protein 6.5 - 8.1 g/dL 7.0  7.3  7.0   Total Bilirubin 0.3 - 1.2 mg/dL 0.7  0.6  0.6   Alkaline Phos 38 - 126 U/L 48  57  58   AST 15 - 41 U/L 24  22  19    ALT 0 - 44 U/L 22  23  24      Chronic lymphocytic leukemia, B cell, CD38 positive (78%).  Cytogenetics revealed Trisomy 12 IGVH unmutated.  RADIOGRAPHIC STUDIES: I have personally reviewed the radiological images as listed and agreed with the findings in the report. no recent images. 11/04/2017  US abdomen Stable right renal cyst. Status post cholecystectomy.Previously seen lesion within the liver is not well appreciated on this exam. No Splenomegaly.

## 2022-06-17 NOTE — Assessment & Plan Note (Signed)
CLL, intermediate risk with trisomy 12. IgVH unmutated. Stage III, extensive bone marrow involvement. Labs are reviewed and discussed with patient. WBC is trending down.  Patient tolerates treatment well. Continue acalabrutinib 100 mg twice daily 

## 2022-06-17 NOTE — Assessment & Plan Note (Signed)
Labs are reviewed and discussed with patient. Stable M protein and normal light chain ration.  Continue observation. Repeat labs every 6 months.  

## 2022-06-17 NOTE — Assessment & Plan Note (Signed)
Treatment as planned above 

## 2022-06-17 NOTE — Assessment & Plan Note (Signed)
avoid nephrotoxins. Encourage oral hydration 

## 2022-06-17 NOTE — Assessment & Plan Note (Signed)
Stable counts.  monitor 

## 2022-07-05 ENCOUNTER — Emergency Department
Admission: EM | Admit: 2022-07-05 | Discharge: 2022-07-05 | Disposition: A | Discharge status: Home / Self Care | Payer: Medicare Other | Attending: Emergency Medicine | Admitting: Emergency Medicine

## 2022-07-05 ENCOUNTER — Emergency Department: Payer: Medicare Other

## 2022-07-05 ENCOUNTER — Other Ambulatory Visit: Payer: Self-pay

## 2022-07-05 DIAGNOSIS — C9590 Leukemia, unspecified not having achieved remission: Secondary | ICD-10-CM | POA: Insufficient documentation

## 2022-07-05 DIAGNOSIS — M545 Low back pain, unspecified: Secondary | ICD-10-CM

## 2022-07-05 DIAGNOSIS — R079 Chest pain, unspecified: Secondary | ICD-10-CM | POA: Diagnosis present

## 2022-07-05 DIAGNOSIS — N189 Chronic kidney disease, unspecified: Secondary | ICD-10-CM | POA: Diagnosis not present

## 2022-07-05 DIAGNOSIS — I129 Hypertensive chronic kidney disease with stage 1 through stage 4 chronic kidney disease, or unspecified chronic kidney disease: Secondary | ICD-10-CM | POA: Diagnosis not present

## 2022-07-05 LAB — CBC
HCT: 42.8 % (ref 39.0–52.0)
Hemoglobin: 14.2 g/dL (ref 13.0–17.0)
MCH: 34.5 pg — ABNORMAL HIGH (ref 26.0–34.0)
MCHC: 33.2 g/dL (ref 30.0–36.0)
MCV: 103.9 fL — ABNORMAL HIGH (ref 80.0–100.0)
Platelets: 103 10*3/uL — ABNORMAL LOW (ref 150–400)
RBC: 4.12 MIL/uL — ABNORMAL LOW (ref 4.22–5.81)
RDW: 12.5 % (ref 11.5–15.5)
WBC: 12.3 10*3/uL — ABNORMAL HIGH (ref 4.0–10.5)
nRBC: 0 % (ref 0.0–0.2)

## 2022-07-05 LAB — CBC WITH DIFFERENTIAL/PLATELET
Abs Immature Granulocytes: 0.03 10*3/uL (ref 0.00–0.07)
Basophils Absolute: 0.1 10*3/uL (ref 0.0–0.1)
Basophils Relative: 0 %
Eosinophils Absolute: 0.1 10*3/uL (ref 0.0–0.5)
Eosinophils Relative: 1 %
HCT: 42.6 % (ref 39.0–52.0)
Hemoglobin: 14.2 g/dL (ref 13.0–17.0)
Immature Granulocytes: 0 %
Lymphocytes Relative: 54 %
Lymphs Abs: 6.3 10*3/uL — ABNORMAL HIGH (ref 0.7–4.0)
MCH: 34.6 pg — ABNORMAL HIGH (ref 26.0–34.0)
MCHC: 33.3 g/dL (ref 30.0–36.0)
MCV: 103.9 fL — ABNORMAL HIGH (ref 80.0–100.0)
Monocytes Absolute: 0.6 10*3/uL (ref 0.1–1.0)
Monocytes Relative: 5 %
Neutro Abs: 4.7 10*3/uL (ref 1.7–7.7)
Neutrophils Relative %: 40 %
Platelets: 110 10*3/uL — ABNORMAL LOW (ref 150–400)
RBC: 4.1 MIL/uL — ABNORMAL LOW (ref 4.22–5.81)
RDW: 12.5 % (ref 11.5–15.5)
Smear Review: DECREASED
WBC Morphology: ABNORMAL
WBC: 11.8 10*3/uL — ABNORMAL HIGH (ref 4.0–10.5)
nRBC: 0 % (ref 0.0–0.2)

## 2022-07-05 LAB — BASIC METABOLIC PANEL
Anion gap: 10 (ref 5–15)
BUN: 37 mg/dL — ABNORMAL HIGH (ref 8–23)
CO2: 26 mmol/L (ref 22–32)
Calcium: 9.3 mg/dL (ref 8.9–10.3)
Chloride: 102 mmol/L (ref 98–111)
Creatinine, Ser: 1.16 mg/dL (ref 0.61–1.24)
GFR, Estimated: >60 mL/min (ref >60)
Glucose, Bld: 126 mg/dL — ABNORMAL HIGH (ref 70–99)
Potassium: 4.2 mmol/L (ref 3.5–5.1)
Sodium: 138 mmol/L (ref 135–145)

## 2022-07-05 LAB — TROPONIN I (HIGH SENSITIVITY): Troponin I (High Sensitivity): 7 ng/L (ref <18)

## 2022-07-05 MED ORDER — IOHEXOL 350 MG/ML SOLN
75.0000 mL | Freq: Once | INTRAVENOUS | Status: AC | PRN
  Administered 2022-07-05: 75 mL via INTRAVENOUS

## 2022-07-05 NOTE — ED Provider Notes (Signed)
[facility] Provider Note    Event Date/Time   First MD Initiated Contact with Patient 07/05/22 1528     (approximate)  History   Chief Complaint: Chest Pain  HPI  Allen Chang is a 89 y.o. male with a past medical history of CKD, gastric reflux, hypertension, leukemia currently on oral chemotherapy who presents to the emergency department for left-sided chest pain.  According to the patient since this morning he has now had 3 episodes of sharp pain into the left chest, states mostly occur either when he eats or takes a deep breath.  Denies any pain currently.  Wife is here who states a week ago he had a fall from a couple feet landing onto his buttocks.  Since that time he has been complaining of pain in the mid to lower back as well.  Wife states the patient has refused evaluation for this previously but she would like this to be evaluated here if possible.  Patient denies any weakness or numbness of any arm or leg.  No confusion or incontinence.  Physical Exam   Triage Vital Signs: ED Triage Vitals  Enc Vitals Group     BP 07/05/22 1432 (!) 145/94     Pulse Rate 07/05/22 1432 68     Resp 07/05/22 1432 20     Temp 07/05/22 1432 (!) 97.5 F (36.4 C)     Temp Source 07/05/22 1432 Oral     SpO2 07/05/22 1432 97 %     Weight 07/05/22 1429 163 lb (73.9 kg)     Height 07/05/22 1429 5\' 8"  (1.727 m)     Head Circumference --      Peak Flow --      Pain Score 07/05/22 1429 3     Pain Loc --      Pain Edu? --      Excl. in GC? --     Most recent vital signs: Vitals:   07/05/22 1432  BP: (!) 145/94  Pulse: 68  Resp: 20  Temp: (!) 97.5 F (36.4 C)  SpO2: 97%    General: Awake, no distress.  CV:  Good peripheral perfusion.  Regular rate and rhythm  Resp:  Normal effort.  Equal breath sounds bilaterally.  Chest wall is nontender to palpation. Abd:  No distention.  Soft, nontender.  No rebound or guarding.  No abdominal tenderness.  ED Results /  Procedures / Treatments   EKG  EKG viewed and interpreted by myself shows atrial fibrillation at 68 bpm with a narrow QRS, normal axis, normal intervals, no concerning ST changes.  RADIOLOGY  I have reviewed and interpreted the chest x-ray images.  No consolidation seen on my evaluation. Radiology is read the chest x-ray is minimal left basal atelectasis/scarring.  Large hiatal hernia.   CTA of the chest is negative for PE. CT T-spine negative for fracture CT L-spine negative for fracture  MEDICATIONS ORDERED IN ED: Medications - No data to display   IMPRESSION / MDM / ASSESSMENT AND PLAN / ED COURSE  I reviewed the triage vital signs and the nursing notes.  Patient's presentation is most consistent with acute presentation with potential threat to life or bodily function.  Patient presents to the emergency department for left-sided chest pain sharp in nature.  Patient states that has occurred with deep inspiration as well as when he is eating.  Patient is currently on oral chemotherapy for leukemia.  Given the pleuritic nature and sharp  chest pain we will obtain CT imaging to rule out pulmonary embolism.  Chest x-ray shows no consolidation but does show a very large hiatal hernia and this could very likely be the cause of the patient's intermittent sharp pain due to gas pain/stretching.  No vomiting or clinical signs of obstruction.  Lab work is pending.  Given the patient's fall 1 week ago with mid to lower back pain we will also obtain CT imaging of the T and L-spine.  Patient agreeable to plan of care.  Patient CT scan shows no acute finding, no T or L-spine fractures no sign of PE.  Patient does have a large hiatal hernia which is very likely the cause of the patient's intermittent chest discomfort specially associate with eating.  Discussed with the patient following up with his PCP, over-the-counter antacids such as liquid Maalox.  Patient agreeable to plan of care.  Continues to  deny any chest pain in the emergency department.  FINAL CLINICAL IMPRESSION(S) / ED DIAGNOSES   Chest pain Back pain   Note:  This document was prepared using Dragon voice recognition software and may include unintentional dictation errors.   Minna Antis, MD 07/05/22 [phone]

## 2022-07-05 NOTE — ED Triage Notes (Signed)
Pt to ED for L chest pain that started about 1 hour ago. Pain is sharp and worse with inspiration. Pt is on oral chemotherapy. Respirations are unlabored and skin dry. EKG shows ea fib, hx of same.

## 2022-07-05 NOTE — ED Notes (Signed)
Blue top sent with other labs,.

## 2022-08-01 ENCOUNTER — Emergency Department
Admission: EM | Admit: 2022-08-01 | Discharge: 2022-08-01 | Disposition: A | Discharge status: Home / Self Care | Payer: Medicare Other | Attending: Emergency Medicine | Admitting: Emergency Medicine

## 2022-08-01 ENCOUNTER — Emergency Department: Payer: Medicare Other

## 2022-08-01 ENCOUNTER — Other Ambulatory Visit: Payer: Self-pay

## 2022-08-01 DIAGNOSIS — N189 Chronic kidney disease, unspecified: Secondary | ICD-10-CM | POA: Diagnosis not present

## 2022-08-01 DIAGNOSIS — R0789 Other chest pain: Secondary | ICD-10-CM

## 2022-08-01 DIAGNOSIS — I129 Hypertensive chronic kidney disease with stage 1 through stage 4 chronic kidney disease, or unspecified chronic kidney disease: Secondary | ICD-10-CM | POA: Insufficient documentation

## 2022-08-01 LAB — CBC
HCT: 39.8 % (ref 39.0–52.0)
Hemoglobin: 13.1 g/dL (ref 13.0–17.0)
MCH: 34.7 pg — ABNORMAL HIGH (ref 26.0–34.0)
MCHC: 32.9 g/dL (ref 30.0–36.0)
MCV: 105.3 fL — ABNORMAL HIGH (ref 80.0–100.0)
Platelets: 102 10*3/uL — ABNORMAL LOW (ref 150–400)
RBC: 3.78 MIL/uL — ABNORMAL LOW (ref 4.22–5.81)
RDW: 12.7 % (ref 11.5–15.5)
WBC: 8.5 10*3/uL (ref 4.0–10.5)
nRBC: 0 % (ref 0.0–0.2)

## 2022-08-01 LAB — BASIC METABOLIC PANEL
Anion gap: 8 (ref 5–15)
BUN: 34 mg/dL — ABNORMAL HIGH (ref 8–23)
CO2: 27 mmol/L (ref 22–32)
Calcium: 9.5 mg/dL (ref 8.9–10.3)
Chloride: 108 mmol/L (ref 98–111)
Creatinine, Ser: 1.13 mg/dL (ref 0.61–1.24)
GFR, Estimated: >60 mL/min (ref >60)
Glucose, Bld: 175 mg/dL — ABNORMAL HIGH (ref 70–99)
Potassium: 3.9 mmol/L (ref 3.5–5.1)
Sodium: 143 mmol/L (ref 135–145)

## 2022-08-01 LAB — TROPONIN I (HIGH SENSITIVITY)
Troponin I (High Sensitivity): 16 ng/L (ref <18)
Troponin I (High Sensitivity): 16 ng/L (ref <18)

## 2022-08-01 MED ORDER — TRAMADOL HCL 50 MG PO TABS: 50.0000 mg | ORAL_TABLET | Freq: Two times a day (BID) | ORAL | 0 refills | Status: AC | PRN

## 2022-08-01 NOTE — ED Provider Notes (Signed)
[facility] Provider Note    Event Date/Time   First MD Initiated Contact with Patient 08/01/22 1744     (approximate)   History   Chest Pain (X 2 weeks)   HPI  Allen Chang is a 89 y.o. male with a history of atrial fibrillation, CKD, GERD, hypertension, and CLL on oral chemotherapy who presents with chest pain which has been going on for about a month.  The pain is almost always in the left side of his chest near the nipple and is sharp in quality.  Sometimes he feels slightly more in the center of his chest.  It is not related to exertion.  It is sometimes related to certain arm positions or when he is doing physical movements with his upper body.  He denies any associated shortness of breath, lightheadedness, or nausea or vomiting.  The patient states that the pain is similar to what he was seen in the ED for a few weeks ago.  The patient's family member reports that he has a history of atrial fibrillation and previously had a flareup of "severe" atrial fibrillation associated with chemotherapy.  He is now back on oral chemotherapy and she is concerned that this may be the cause of his symptoms.  I reviewed the past medical records.  The patient was seen in the ED on 5/18 for chest pain.  Workup, including CTA chest rule out PE, was negative.  Previously her most recent outpatient encounter was on 4/30 with Dr. Cathie Hoops from oncology for follow-up of her CLL.   Physical Exam   Triage Vital Signs: ED Triage Vitals [08/01/22 1537]  Enc Vitals Group     BP 131/84     Pulse Rate 85     Resp 16     Temp 98 F (36.7 C)     Temp Source Oral     SpO2 98 %     Weight 162 lb 14.7 oz (73.9 kg)     Height 5\' 8"  (1.727 m)     Head Circumference      Peak Flow      Pain Score 5     Pain Loc      Pain Edu?      Excl. in GC?     Most recent vital signs: Vitals:   08/01/22 1537 08/01/22 1923  BP: 131/84 127/71  Pulse: 85 89  Resp: 16 16  Temp: 98 F (36.7  C) 98 F (36.7 C)  SpO2: 98% 97%     General: Alert and oriented, well-appearing for age.  No distress.  CV:  Good peripheral perfusion.  Normal heart sounds. Resp:  Normal effort.  Lungs CTAB. Abd:  No distention.  Other:  No significant peripheral edema.   ED Results / Procedures / Treatments   Labs (all labs ordered are listed, but only abnormal results are displayed) Labs Reviewed  BASIC METABOLIC PANEL - Abnormal; Notable for the following components:      Result Value   Glucose, Bld 175 (*)    BUN 34 (*)    All other components within normal limits  CBC - Abnormal; Notable for the following components:   RBC 3.78 (*)    MCV 105.3 (*)    MCH 34.7 (*)    Platelets 102 (*)    All other components within normal limits  TROPONIN I (HIGH SENSITIVITY)  TROPONIN I (HIGH SENSITIVITY)     EKG  ED ECG REPORT I, Wilmon Pali  Stepan Verrette, the attending physician, personally viewed and interpreted this ECG.  Date: 08/01/2022 EKG Time: 1537 Rate: 86 Rhythm: Atrial fibrillation QRS Axis: left axis Intervals: normal ST/T Wave abnormalities: normal Narrative Interpretation: no evidence of acute ischemia   ED ECG REPORT I, Dionne Bucy, the attending physician, personally viewed and interpreted this ECG.  Date: 08/01/2022 EKG Time: 1829 Rate: 62 Rhythm: Atrial fibrillation QRS Axis: Left axis Intervals: normal ST/T Wave abnormalities: normal Narrative Interpretation: no evidence of acute ischemia   RADIOLOGY  Chest x-ray: I independently viewed and interpreted the images; there is no focal consolidation or edema    PROCEDURES:  Critical Care performed: No  Procedures   MEDICATIONS ORDERED IN ED: Medications - No data to display   IMPRESSION / MDM / ASSESSMENT AND PLAN / ED COURSE  I reviewed the triage vital signs and the nursing notes.  89 year old male with PMH as noted above presents with intermittent chest pain over the last several weeks.  On  exam his vital signs are normal, he is well-appearing, and exam is otherwise unremarkable.  Patient is not experiencing any pain currently.  EKG shows atrial fibrillation with no ischemic changes.  Rate is normal.  Differential diagnosis includes, but is not limited to, musculoskeletal chest wall pain, GERD, other benign etiology.  I have a low suspicion for ACS.  The patient has no signs or symptoms of rapid atrial fibrillation or other cardiac etiology.  The pain is similar to what he was feeling when he presented to the ED last month.  At that time he was ruled out for PE.  The patient associates the pain oftentimes with some positions and overall appears to be most likely musculoskeletal in nature.  At this time the patient is in atrial fibrillation, which was the case last month as well.  I do not suspect that the atrial fibrillation is the cause of his pain.  We will obtain basic labs, cardiac enzymes, and reassess.  Patient's presentation is most consistent with acute complicated illness / injury requiring diagnostic workup.  The patient is on the cardiac monitor to evaluate for evidence of arrhythmia and/or significant heart rate changes.  ----------------------------------------- 7:03 PM on 08/01/2022 -----------------------------------------  Lab workup is unremarkable with no significant electrolyte abnormalities or anemia.  Initial and repeat troponins are negative.  The patient remains without any chest pain.  He feels well and is stable for discharge at this time.  I counseled him and his family member on the results of the workup.  I gave strict return precautions and they expressed understanding.   FINAL CLINICAL IMPRESSION(S) / ED DIAGNOSES   Final diagnoses:  Atypical chest pain     Rx / DC Orders   ED Discharge Orders          Ordered    traMADol (ULTRAM) 50 MG tablet  Every 12 hours PRN        08/01/22 1902             Note:  This document was prepared  using Dragon voice recognition software and may include unintentional dictation errors.   Dionne Bucy, MD 08/01/22 2348

## 2022-08-01 NOTE — Discharge Instructions (Addendum)
Your EKG shows atrial fibrillation but the heart rate is normal and your cardiac enzymes are normal, so there is no sign of strain on the heart.  Continue taking your normal medications as prescribed.  Follow-up with your primary care provider.  You may take the tramadol as needed for severe pain especially later in the day.  It can sometimes make you lightheaded so do not drive or operate machinery while on this medication.  Return to the ER for new, worsening, or persistent severe chest pain, difficulty breathing, weakness or lightheadedness, palpitations, feeling like you are going to pass out, or any other new or worsening symptoms that concern you.

## 2022-08-01 NOTE — ED Triage Notes (Signed)
Pt to ed from home fro CP x 2 weeks. Pt was just seen for same on 5/18. Pt been having intermittent CP.  Pt is caox4, in no acute distress and ambulatory in triage.

## 2022-08-27 IMAGING — CT CT CTA ABD/PEL W/CM AND/OR W/O CM
3 of 10 series · 10 of 46 positions shown, 16 images · IV contrast (omnipaque)
Comparison: None.

CLINICAL DATA: GI bleed.  Abdomen pelvis CT 02/25/2018

EXAM:
CTA ABDOMEN AND PELVIS WITHOUT AND WITH CONTRAST
TECHNIQUE: Multidetector CT imaging of the abdomen and pelvis was performed
using the standard protocol during bolus administration of
intravenous contrast. Multiplanar reconstructed images and MIPs were
obtained and reviewed to evaluate the vascular anatomy.
CONTRAST:  100mL OMNIPAQUE IOHEXOL 350 MG/ML SOLN

[Series 5: axial arterial · axial · arterial · 0.75mm/px · z∈[-1164,-1042]mm · 3 of 231 slices shown]
[im 16/231  soft-tissue]
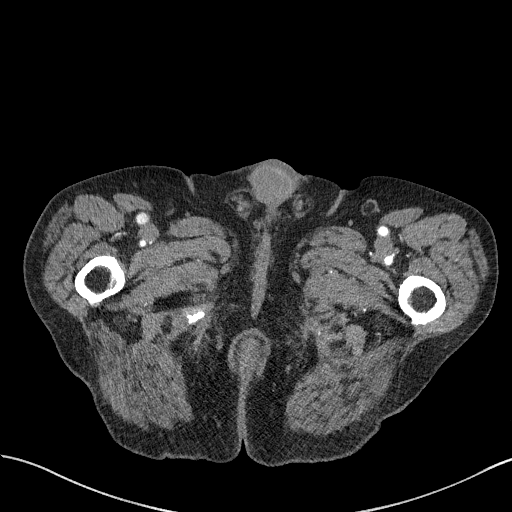
[im 47/231  soft-tissue]
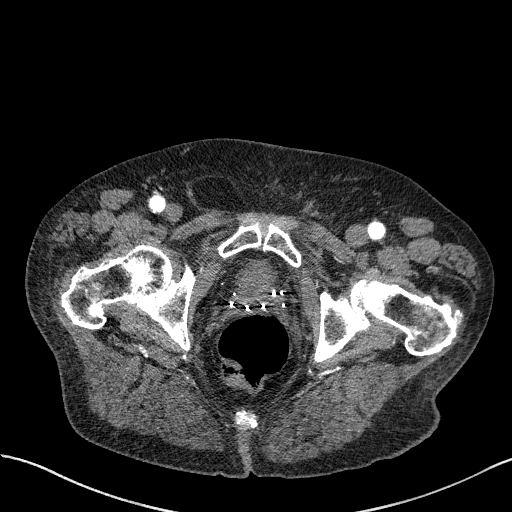
[im 77/231  soft-tissue]
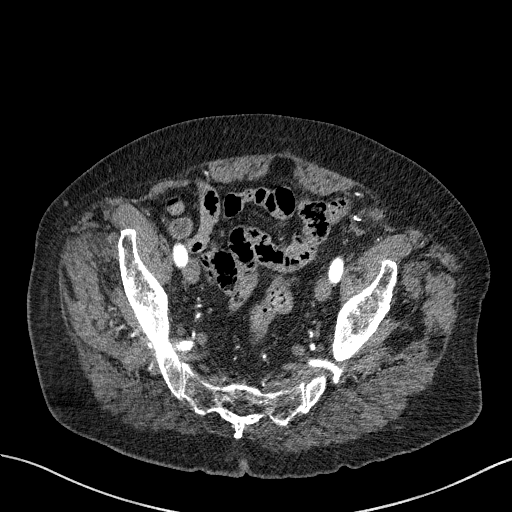

[Series 7: axial venous delay · axial · portal-venous · 0.75mm/px · z∈[-1112,-792]mm · 5 of 96 slices shown, 10 images]
[im 16/96  soft-tissue]
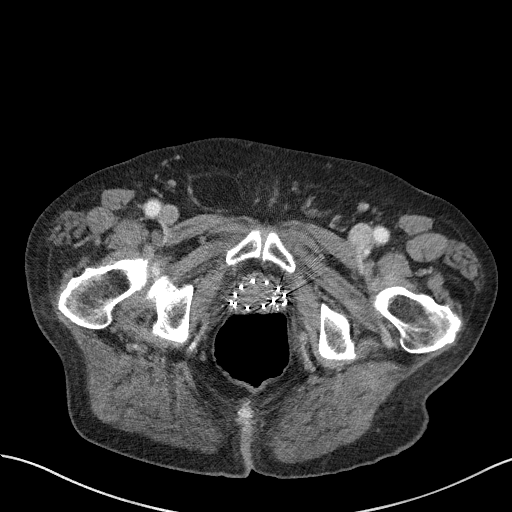
[im 16/96  bone]
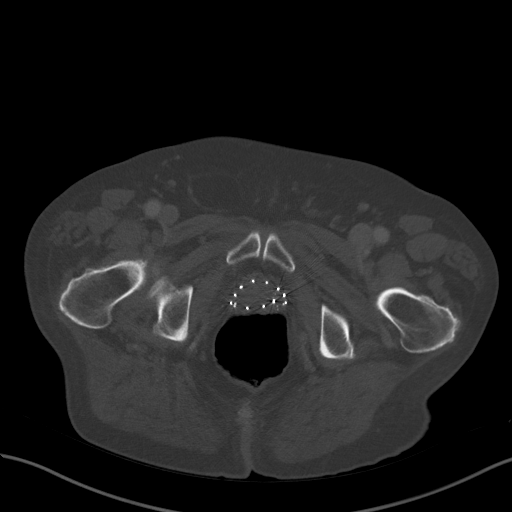
[im 32/96  soft-tissue]
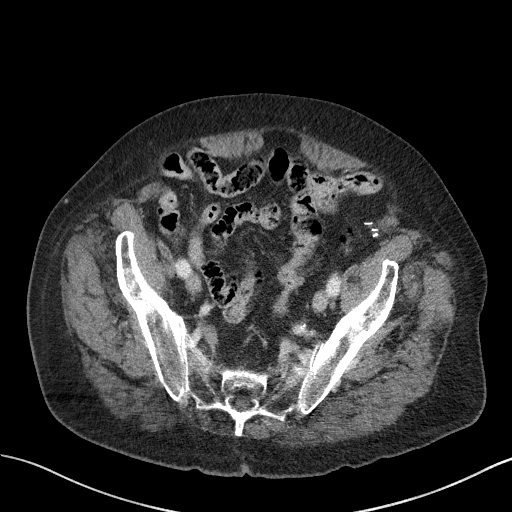
[im 32/96  lung]
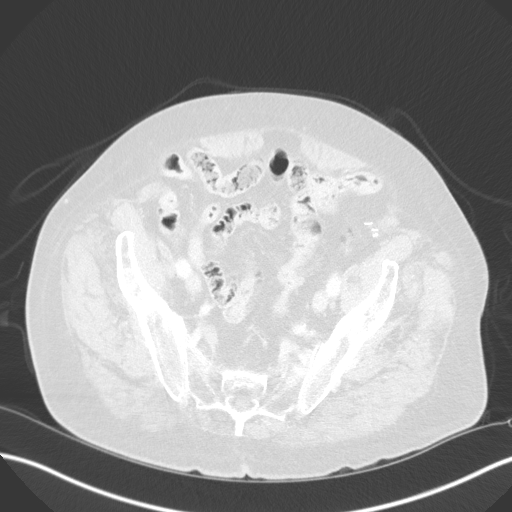
[im 48/96  soft-tissue]
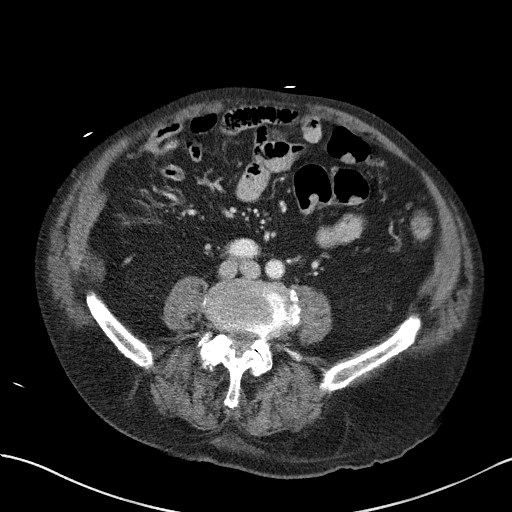
[im 48/96  lung]
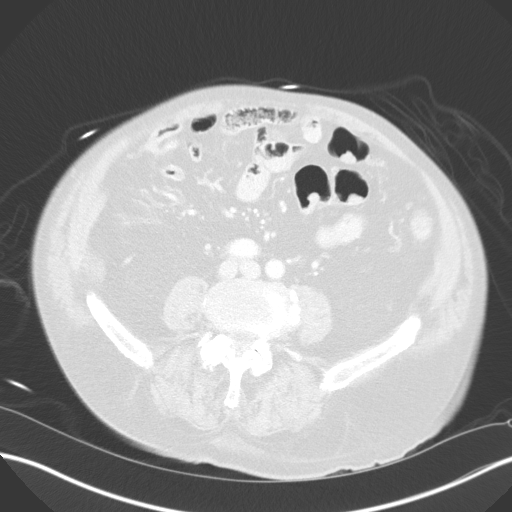
[im 64/96  soft-tissue]
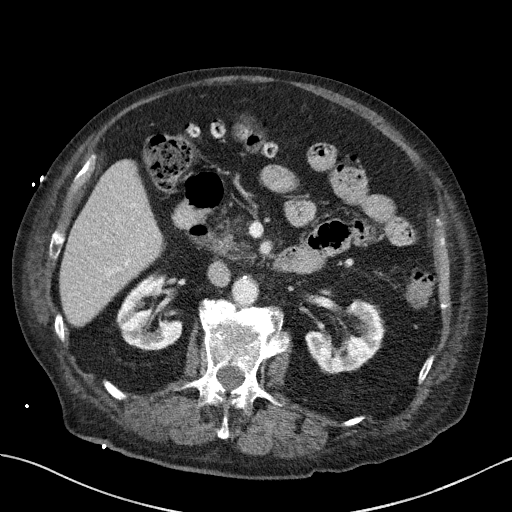
[im 64/96  lung]
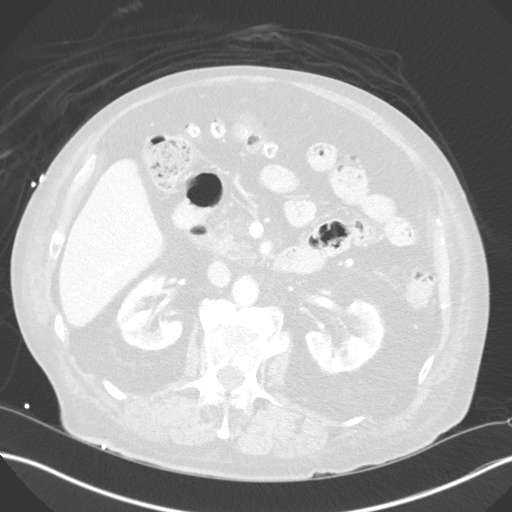
[im 80/96  soft-tissue]
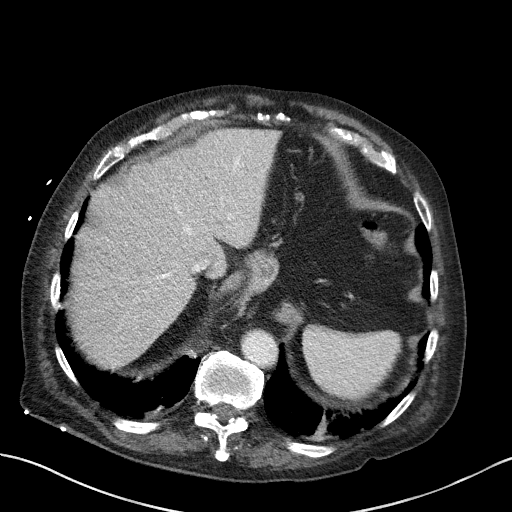
[im 80/96  lung]
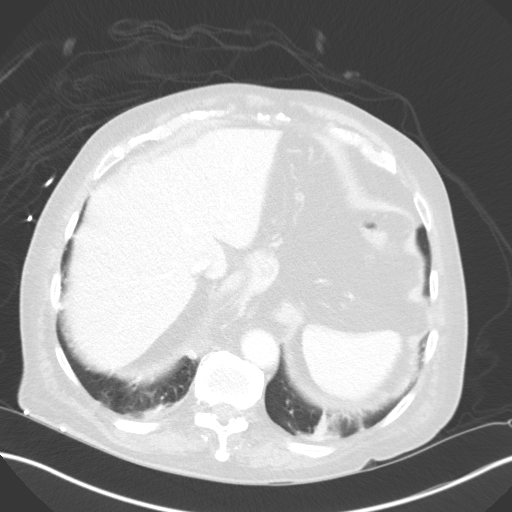

[Series 14: coronal venous · coronal · portal-venous · 0.74mm/px · 2 of 153 slices shown, 3 images]
[im 51/153  soft-tissue]
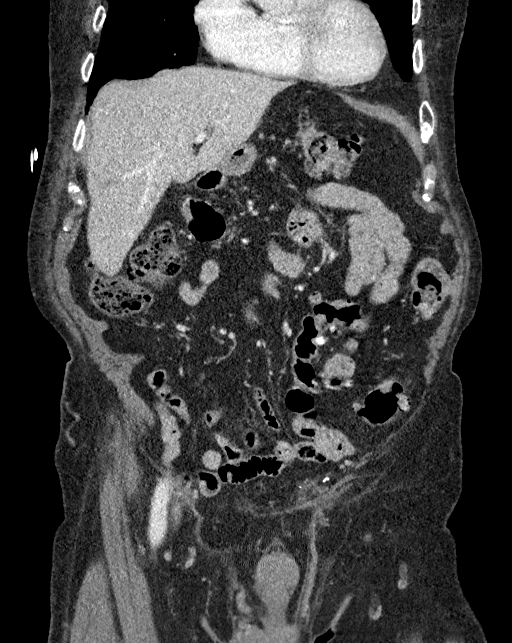
[im 51/153  bone]
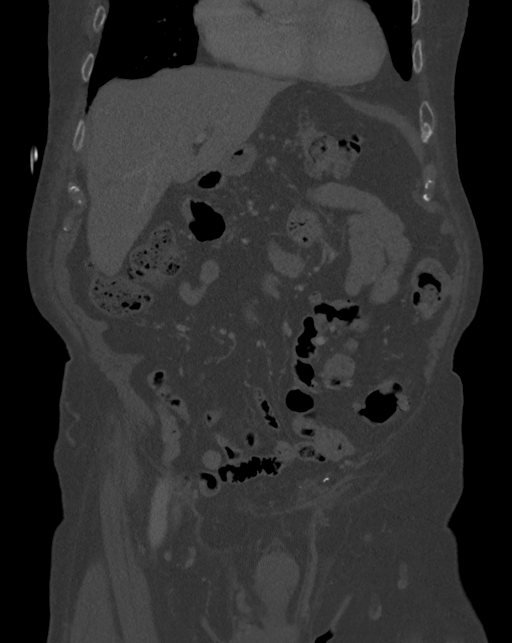
[im 102/153  soft-tissue]
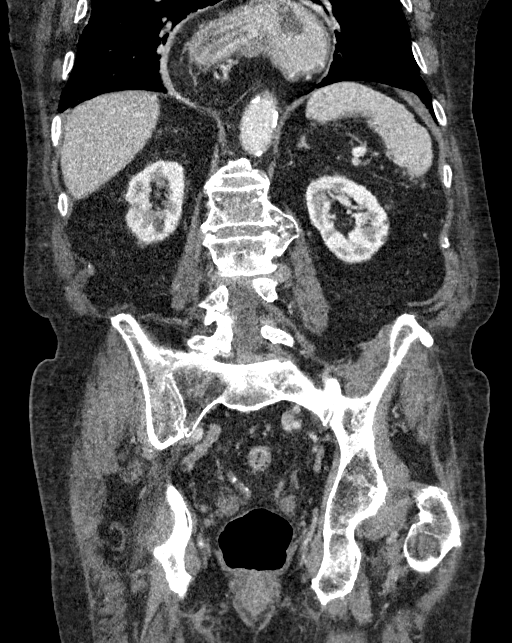

[10 of 46 positions shown; findings below may reference images not displayed]

FINDINGS: VASCULAR

Aorta: Normal caliber aorta without aneurysm, dissection, vasculitis
or significant stenosis. Moderate atherosclerosis.

Celiac: Patent without evidence of aneurysm, dissection, vasculitis
or significant stenosis.

SMA: Patent without evidence of aneurysm, dissection, vasculitis or
significant stenosis.

Renals: Single bilateral renal arteries. Both renal arteries are
patent. There is plaque at the origin of the left renal artery
causing approximately 50% stenosis. No dissection, aneurysm, or
vasculitis.

IMA: Patent.

Inflow: Patent without evidence of aneurysm, dissection, vasculitis
or significant stenosis.

Proximal Outflow: Bilateral common femoral and visualized portions
of the superficial and profunda femoral arteries are patent without
evidence of aneurysm, dissection, vasculitis or significant
stenosis.

Veins: Venous phase imaging demonstrates patent hepatic and portal
veins. Mesenteric veins are patent. No portal venous or mesenteric
gas. Iliac veins and IVC are unremarkable.

Review of the MIP images confirms the above findings.

NON-VASCULAR

Lower chest: Large hiatal hernia. Adjacent compressive atelectasis
in both lower lobes. There is also linear atelectasis or scarring in
both dependent lower lobes. No pleural fluid. Heart size normal.

Hepatobiliary: Tiny hypodensity in the right lobe of the liver,
series 7, image 16, too small to characterize. Cholecystectomy
without biliary dilatation.

Pancreas: Fatty atrophy.  No ductal dilatation or inflammation.

Spleen: Normal in size without focal abnormality.

Adrenals/Urinary Tract: Normal adrenal glands. No hydronephrosis.
Mild symmetric perinephric edema, typically chronic. Right renal
cyst measures 15 mm, additional low-density bilateral renal cortical
lesions are too small to characterize. Unremarkable urinary bladder.

Stomach/Bowel: There is no contrast accumulation within the GI tract
to localize site of GI bleed. There is high-density barium within
scattered colonic diverticula that is also present on precontrast
exam. Moderate diffuse colonic diverticulosis without diverticulitis
or acute colonic inflammation. Colonic tortuosity with moderate
volume of stool. There is fecalization of small bowel contents. No
small bowel obstruction or inflammation. Large hiatal hernia with
the majority of the stomach being intrathoracic.

Lymphatic: No abdominopelvic adenopathy.

Reproductive: Brachytherapy seeds in the prostate gland.

Other: Moderate-sized fat containing right inguinal hernia. Prior
left inguinal hernia repair. There is residual fat in the left
inguinal canal. No intra-abdominal free air or free fluid. No
abscess.

Musculoskeletal: There are no acute or suspicious osseous
abnormalities. Degenerative change of both hips. Degenerative change
in the spine with primarily facet hypertrophy.
IMPRESSION: 1. No contrast accumulation within the GI tract to localize site of
GI bleed. No evidence of bowel inflammation.
2. Moderate diffuse colonic diverticulosis without diverticulitis.
3. Large hiatal hernia with the majority of the stomach being
intrathoracic.
4. Moderate fat containing right inguinal hernia. Prior left
inguinal hernia repair.

Aortic Atherosclerosis ([id number]).

## 2022-09-16 ENCOUNTER — Encounter: Payer: Self-pay | Admitting: Oncology

## 2022-09-16 ENCOUNTER — Inpatient Hospital Stay (HOSPITAL_BASED_OUTPATIENT_CLINIC_OR_DEPARTMENT_OTHER): Payer: Medicare Other | Admitting: Oncology

## 2022-09-16 ENCOUNTER — Inpatient Hospital Stay: Payer: Medicare Other | Attending: Oncology

## 2022-09-16 VITALS — BP 119/68 | HR 60 | Temp 96.0°F | Resp 18 | Wt 162.5 lb

## 2022-09-16 DIAGNOSIS — D696 Thrombocytopenia, unspecified: Secondary | ICD-10-CM | POA: Diagnosis not present

## 2022-09-16 DIAGNOSIS — C9110 Chronic lymphocytic leukemia of B-cell type not having achieved remission: Secondary | ICD-10-CM

## 2022-09-16 DIAGNOSIS — D72819 Decreased white blood cell count, unspecified: Secondary | ICD-10-CM | POA: Insufficient documentation

## 2022-09-16 DIAGNOSIS — D509 Iron deficiency anemia, unspecified: Secondary | ICD-10-CM | POA: Diagnosis present

## 2022-09-16 DIAGNOSIS — T451X5A Adverse effect of antineoplastic and immunosuppressive drugs, initial encounter: Secondary | ICD-10-CM

## 2022-09-16 DIAGNOSIS — D472 Monoclonal gammopathy: Secondary | ICD-10-CM | POA: Diagnosis present

## 2022-09-16 DIAGNOSIS — D701 Agranulocytosis secondary to cancer chemotherapy: Secondary | ICD-10-CM

## 2022-09-16 DIAGNOSIS — Z79899 Other long term (current) drug therapy: Secondary | ICD-10-CM | POA: Diagnosis not present

## 2022-09-16 LAB — CMP (CANCER CENTER ONLY)
ALT: 23 U/L (ref 0–44)
AST: 26 U/L (ref 15–41)
Albumin: 3.6 g/dL (ref 3.5–5.0)
Alkaline Phosphatase: 65 U/L (ref 38–126)
Anion gap: 8 (ref 5–15)
BUN: 29 mg/dL — ABNORMAL HIGH (ref 8–23)
CO2: 24 mmol/L (ref 22–32)
Calcium: 9.2 mg/dL (ref 8.9–10.3)
Chloride: 103 mmol/L (ref 98–111)
Creatinine: 1.19 mg/dL (ref 0.61–1.24)
GFR, Estimated: 59 mL/min — ABNORMAL LOW (ref >60)
Glucose, Bld: 101 mg/dL — ABNORMAL HIGH (ref 70–99)
Potassium: 4.2 mmol/L (ref 3.5–5.1)
Sodium: 135 mmol/L (ref 135–145)
Total Bilirubin: 0.3 mg/dL (ref 0.3–1.2)
Total Protein: 6.9 g/dL (ref 6.5–8.1)

## 2022-09-16 LAB — CBC WITH DIFFERENTIAL (CANCER CENTER ONLY)
Abs Immature Granulocytes: 0.02 10*3/uL (ref 0.00–0.07)
Basophils Absolute: 0 10*3/uL (ref 0.0–0.1)
Basophils Relative: 1 %
Eosinophils Absolute: 0 10*3/uL (ref 0.0–0.5)
Eosinophils Relative: 1 %
HCT: 43 % (ref 39.0–52.0)
Hemoglobin: 14.1 g/dL (ref 13.0–17.0)
Immature Granulocytes: 1 %
Lymphocytes Relative: 37 %
Lymphs Abs: 1 10*3/uL (ref 0.7–4.0)
MCH: 34 pg (ref 26.0–34.0)
MCHC: 32.8 g/dL (ref 30.0–36.0)
MCV: 103.6 fL — ABNORMAL HIGH (ref 80.0–100.0)
Monocytes Absolute: 0.2 10*3/uL (ref 0.1–1.0)
Monocytes Relative: 8 %
Neutro Abs: 1.4 10*3/uL — ABNORMAL LOW (ref 1.7–7.7)
Neutrophils Relative %: 52 %
Platelet Count: 77 10*3/uL — ABNORMAL LOW (ref 150–400)
RBC: 4.15 MIL/uL — ABNORMAL LOW (ref 4.22–5.81)
RDW: 13.1 % (ref 11.5–15.5)
WBC Count: 2.6 10*3/uL — ABNORMAL LOW (ref 4.0–10.5)
nRBC: 0 % (ref 0.0–0.2)

## 2022-09-16 LAB — LACTATE DEHYDROGENASE: LDH: 123 U/L (ref 98–192)

## 2022-09-16 NOTE — Assessment & Plan Note (Signed)
Likely secondary to marrow suppression from acalabrutinib.  Hold off treatments.  Repeat CBC in 4 weeks.

## 2022-09-16 NOTE — Progress Notes (Signed)
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 Stage III, extensive bone marrow involvement. Labs are reviewed and discussed with patient. WBC is trending down.  Patient tolerates treatment well. Recommend patient to hold off acalabrutinib 100 mg twice daily-due to leukopenia and thrombocytopenia.  Thrombocytopenia (HCC) Likely secondary to marrow suppression from acalabrutinib.  Hold off treatments.  Repeat CBC in 4 weeks.  Leukopenia Neutropenic precaution.  ANC 1.4.  Hold off acalabrutinib.  MGUS (monoclonal gammopathy of unknown significance) Labs are reviewed and discussed with patient. Stable M protein and normal light chain ration.  Continue observation. Repeat labs every 6 months.   Orders Placed This Encounter  Procedures   CBC with Differential (Cancer Center Only)    Standing Status:   Future    Standing Expiration Date:   09/16/2023   CMP (Cancer Center only)    Standing Status:   Future    Standing Expiration Date:   09/16/2023   Lactate dehydrogenase    Standing Status:   Future    Standing Expiration Date:   09/16/2023   CBC with Differential (Cancer Center Only)    Standing Status:   Future    Standing Expiration Date:   09/16/2023   Multiple Myeloma Panel (SPEP&IFE w/QIG)    Standing Status:   Future    Standing Expiration Date:   09/16/2023   Kappa/lambda light chains    Standing Status:   Future    Standing Expiration Date:   09/16/2023   Follow up in  2 months All questions were answered. The patient knows to call the clinic with any problems, questions or concerns.  Rickard Patience, MD, PhD [facility] Hematology Oncology 09/16/2022     HISTORY OF PRESENTING ILLNESS:  Allen Chang 89 y.o.  male with PMH listed as below who was referred by primary  care provider to me for evaluation of leukocytosis. He has a history of prostate cancer which was diagnosed 10 years ago. He underwent brachytherapy. He is not any castration treatment. Per patient, he follows up with Dr.Wolf and most recent PSA is normal.   Patient also reports feeling fatigue lately. Recent labs show leukocytosis, mild anemia, macrocytosis, mild thrombocytopenia, low ferritin. Denies blood in the stool. He was started on over the counter iron supplement but wife who manages patient's medication decides to only let patient to take iron medication every other day.   # received IV iron Venofer x 4. Colonoscopy was done on 02/16/2017 which showed polyps, internal hemorrhoids, negative for malignancy.  ##Patient had bone marrow biopsy on 09/22/2018 Pathology showed hypercellular bone marrow with extensive involvement by chronic lymphocytic leukemia. # Started on acalabrutinib 100 mg twice daily on 10/01/2018. 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  #August 2020-acalabrutinib 100 mg twice daily Stopped in October 2021 during his admission.  # admitted from 12/16/19- 10/30/ 2021 due to sudden onset chest pain. Troponins were negative in the emergency room.  Initial EKG showed atrial fibrillation with heart rate of 55. Patient was seen by cardiology and clarified that the EKG and telemetry actually showed sinus rhythm with PACs patient did not require any anticoagulation.Echocardiogram showed normal EF and no wall motion abnormality.  Nuclear stress testing showed low risk study, EF 63%. Patient was discharged home and followed up with Dr.  Callwood on January 26, 2020 October 2021 -Dec 2023 off Acalabrutinib  03/04/2021 started on acalabrutinib 100mg  BID    INTERVAL HISTORY Allen Chang is a 89 y.o. male who has above history reviewed by me presents for 4 months follow up for CLL and iron deficiency anemia, MGUS Today patient was accompanied by his wife.   Patient reports feeling well.  Appetite is good.   + Chronic fatigue. Denies weight loss, fever, chills, fatigue, night sweats.   Review of Systems  Constitutional:  Negative for appetite change, chills, fatigue, fever and unexpected weight change.  HENT:   Negative for hearing loss and voice change.   Eyes:  Negative for eye problems and icterus.  Respiratory:  Negative for chest tightness, cough and shortness of breath.   Cardiovascular:  Negative for chest pain and leg swelling.  Gastrointestinal:  Positive for constipation. Negative for abdominal distention and abdominal pain.  Endocrine: Negative for hot flashes.  Genitourinary:  Negative for difficulty urinating, dysuria and frequency.   Musculoskeletal:  Positive for arthralgias and gait problem. Negative for back pain.  Skin:  Negative for itching and rash.       Skin cancer  Neurological:  Positive for gait problem. Negative for light-headedness and numbness.  Hematological:  Negative for adenopathy. Does not bruise/bleed easily.  Psychiatric/Behavioral:  Negative for confusion.     MEDICAL HISTORY:  Past Medical History:  Diagnosis Date   Cancer [facility])    prostate    CKD (chronic kidney disease) stage 3, GFR 30-59 ml/min (HCC) 10/01/2016   Complication of anesthesia    bradycardia, in ICU for 24 hour after galbladder surgery.    Diverticulosis 2012   Dysrhythmia    Heart skips a beat   Elevated lipids    GERD (gastroesophageal reflux disease)    History of hiatal hernia    Hypertension    Hypothyroidism    Leukemia (HCC)    Prostate cancer (HCC)    Skin cancer    face, scalp, behind ear,back and hand   Tremors of nervous system     SURGICAL HISTORY: Past Surgical History:  Procedure Laterality Date   BLADDER TUMOR EXCISION     CARDIAC CATHETERIZATION     CATARACT EXTRACTION W/PHACO Right 01/16/2022   Procedure: CATARACT EXTRACTION PHACO AND INTRAOCULAR LENS PLACEMENT (IOC) RIGHT DIABETIC KAHOOK DUAL BLADE  GONIOTOMY 14.34 01:38.4;  Surgeon: Lockie Mola, MD;  Location: [facility];  Service: Ophthalmology;  Laterality: Right;   CATARACT EXTRACTION W/PHACO Left 01/29/2022   Procedure: CATARACT EXTRACTION PHACO AND INTRAOCULAR LENS PLACEMENT (IOC) LEFT DIABETIC KAHOOK DUAL BLADE GONIOTOMY MALYUGIN  12.91  01:31.0;  Surgeon: Lockie Mola, MD;  Location: [facility];  Service: Ophthalmology;  Laterality: Left;   CHOLECYSTECTOMY N/A 10/10/2014   Procedure: LAPAROSCOPIC CHOLECYSTECTOMY WITH INTRAOPERATIVE CHOLANGIOGRAM;  Surgeon: Nadeen Landau, MD;  Location: [facility];  Service: General;  Laterality: N/A;   COLONOSCOPY WITH PROPOFOL N/A 02/16/2017   Procedure: COLONOSCOPY WITH PROPOFOL;  Surgeon: Scot Jun, MD;  Location: [facility];  Service: Endoscopy;  Laterality: N/A;   ESOPHAGOGASTRODUODENOSCOPY (EGD) WITH PROPOFOL N/A 02/16/2017   Procedure: ESOPHAGOGASTRODUODENOSCOPY (EGD) WITH PROPOFOL;  Surgeon: Scot Jun, MD;  Location: [facility] END[facility];  Service: Endoscopy;  Laterality: N/A;   HEMORRHOID SURGERY     HERNIA REPAIR Left    x2   JOINT REPLACEMENT Left    Partial Knee Replacement, Dr. Joice Lofts   PARTIAL KNEE ARTHROPLASTY Left 12/12/2014   Procedure: UNICOMPARTMENTAL KNEE;  Surgeon:  Christena Flake, MD;  Location: [facility];  Service: Orthopedics;  Laterality: Left;   PARTIAL KNEE ARTHROPLASTY Right 09/25/2020   Procedure: RIGHT PARTIAL KNEE REPLACEMENT;  Surgeon: Christena Flake, MD;  Location: [facility];  Service: Orthopedics;  Laterality: Right;   prostate seeding     SHOULDER ARTHROSCOPY Right    SHOULDER ARTHROSCOPY WITH OPEN ROTATOR CUFF REPAIR Left 02/07/2016   Procedure: SHOULDER ARTHROSCOPY WITH OPEN ROTATOR CUFF REPAIR;  Surgeon: Christena Flake, MD;  Location: [facility];  Service: Orthopedics;  Laterality: Left;    SOCIAL HISTORY: Social History   Socioeconomic History   Marital status: Married    Spouse name: Not on file   Number of  children: Not on file   Years of education: Not on file   Highest education level: Not on file  Occupational History   Not on file  Tobacco Use   Smoking status: Never   Smokeless tobacco: Never  Vaping Use   Vaping status: Never Used  Substance and Sexual Activity   Alcohol use: No   Drug use: No   Sexual activity: Yes  Other Topics Concern   Not on file  Social History Narrative   Not on file   Social Determinants of Health   Financial Resource Strain: Low Risk  (08/18/2022)   Received from [facility]   Overall Financial Resource Strain (CARDIA)    Difficulty of Paying Living Expenses: Not hard at all  Food Insecurity: No Food Insecurity (08/18/2022)   Received from [facility]   Hunger Vital Sign    Worried About Running Out of Food in the Last Year: Never true    Ran Out of Food in the Last Year: Never true  Transportation Needs: No Transportation Needs (08/18/2022)   Received from [facility] - Transportation    In the past 12 months, has lack of transportation kept you from medical appointments or from getting medications?: No    Lack of Transportation (Non-Medical): No  Physical Activity: Not on file  Stress: Not on file  Social Connections: Not on file  Intimate Partner Violence: Not on file    FAMILY HISTORY: Family History  Problem Relation Age of Onset   Bone cancer Father    Hypertension Mother    Osteoporosis Mother    Breast cancer Sister    Cancer Brother    Cancer Brother    Lung cancer Brother    Bladder Cancer Brother    Melanoma Brother     ALLERGIES:  is allergic to oxycodone-acetaminophen and voltaren [diclofenac sodium].  MEDICATIONS:  Current Outpatient Medications  Medication Sig Dispense Refill   acalabrutinib maleate (CALQUENCE) 100 MG tablet Take 1 tablet (100 mg total) by mouth 2 (two) times daily. 60 tablet 2   acetaminophen (TYLENOL) 500 MG tablet Take 1,000 mg by  mouth every 8 (eight) hours as needed for moderate pain.     ascorbic acid (VITAMIN C) 1000 MG tablet Take 1,000 mg by mouth daily.     b complex vitamins capsule Take 1 capsule by mouth daily.     calcium carbonate (OSCAL) 1500 (600 Ca) MG TABS tablet Take 1,500 mg by mouth daily with breakfast.     Cholecalciferol 25 MCG (1000 UT) tablet Take 1,000 Units by mouth daily.     donepezil (ARICEPT) 10 MG tablet Take 10 mg by mouth at bedtime.     DULoxetine (CYMBALTA) 20 MG capsule Take 20  mg by mouth daily.     famotidine (PEPCID) 40 MG tablet Take 40 mg by mouth 2 (two) times daily.     ferrous sulfate 325 (65 FE) MG EC tablet Take 325 mg by mouth 3 (three) times daily with meals.     Flaxseed, Linseed, (FLAX SEED OIL) 1000 MG CAPS Take 1,000 mg by mouth daily.     fluorouracil (EFUDEX) 5 % cream Apply to the back of your right hand twice per day for 5 to 7 days. STOP when the area becomes red and irritated even if you haven't done the full 7 days.     furosemide (LASIX) 20 MG tablet Take 20 mg by mouth daily.     Garlic 1000 MG CAPS Take 1 capsule by mouth every morning.     Ginkgo Biloba (GINKOBA PO) Take 2 tablets by mouth daily.     latanoprost (XALATAN) 0.005 % ophthalmic solution Place 1 drop into both eyes at bedtime.     levothyroxine (SYNTHROID) 50 MCG tablet Take 50 mcg by mouth daily before breakfast. Take on an empty stomach with a glass of water at least 30-60 minutes before breakfast.     loratadine (CLARITIN REDITABS) 10 MG dissolvable tablet Take 10 mg by mouth daily. In am     Misc Natural Products (BLACK CHERRY CONCENTRATE) LIQD Take 15 mLs by mouth daily.      MISC NATURAL PRODUCTS PO Take 2 capsules by mouth daily. Beet juice capsules     Omega-3 Fatty Acids (FISH OIL) 1000 MG CAPS Take 1 capsule by mouth daily.     OVER THE COUNTER MEDICATION Take 1 capsule by mouth daily. Neuriva     OVER THE COUNTER MEDICATION Take 1 capsule by mouth daily. Ageless Brain     OVER THE  COUNTER MEDICATION Take 1,000 mg by mouth daily. BACOPA     pantoprazole (PROTONIX) 40 MG tablet Take 40 mg by mouth 2 (two) times daily.     propranolol (INDERAL) 10 MG tablet Take 10 mg by mouth 2 (two) times daily.     vitamin E 180 MG (400 UNITS) capsule Take 400 Units by mouth daily.     zinc gluconate 50 MG tablet Take 50 mg by mouth daily.     albuterol (VENTOLIN HFA) 108 (90 Base) MCG/ACT inhaler Inhale 2 puffs into the lungs every 6 (six) hours as needed for wheezing or shortness of breath. (Patient not taking: Reported on 01/01/2022) 8 g 0   cyanocobalamin 100 MCG tablet Take 100 mcg by mouth daily. (Patient not taking: Reported on 06/17/2022)     gabapentin (NEURONTIN) 100 MG capsule Take 1 capsule by mouth at bedtime. (Patient not taking: Reported on 09/16/2022)     No current facility-administered medications for this visit.      Marland Kitchen  PHYSICAL EXAMINATION: ECOG PERFORMANCE STATUS: 1 - Symptomatic but completely ambulatory Vitals:   09/16/22 1309  BP: 119/68  Pulse: 60  Resp: 18  Temp: (!) 96 F (35.6 C)  SpO2: 98%   Filed Weights   09/16/22 1309  Weight: 162 lb 8 oz (73.7 kg)    Physical Exam Constitutional:      General: He is not in acute distress.    Appearance: He is not diaphoretic.     Comments: Patient ambulates independently  HENT:     Head: Normocephalic and atraumatic.     Nose: Nose normal.     Mouth/Throat:     Pharynx: No oropharyngeal exudate.  Eyes:     General: No scleral icterus.    Pupils: Pupils are equal, round, and reactive to light.  Cardiovascular:     Rate and Rhythm: Normal rate.     Heart sounds: No murmur heard. Pulmonary:     Effort: Pulmonary effort is normal. No respiratory distress.     Breath sounds: No rales.  Chest:     Chest wall: No tenderness.  Abdominal:     General: There is no distension.     Palpations: Abdomen is soft.     Tenderness: There is no abdominal tenderness.  Musculoskeletal:        General: Normal  range of motion.     Cervical back: Normal range of motion and neck supple.     Comments: Status post right knee replacement  Skin:    General: Skin is warm and dry.  Neurological:     Mental Status: He is alert and oriented to person, place, and time.  Psychiatric:        Mood and Affect: Affect normal.   .    LABORATORY DATA:  I have reviewed the data as listed    Latest Ref Rng & Units 09/16/2022   12:46 PM 08/01/2022    3:40 PM 07/05/2022    3:05 PM  CBC  WBC 4.0 - 10.5 K/uL 2.6  8.5  12.3    11.8   Hemoglobin 13.0 - 17.0 g/dL 16.1  09.6  04.5    40.9   Hematocrit 39.0 - 52.0 % 43.0  39.8  42.8    42.6   Platelets 150 - 400 K/uL 77  102  103    110       Latest Ref Rng & Units 09/16/2022   12:46 PM 08/01/2022    3:40 PM 07/05/2022    3:05 PM  CMP  Glucose 70 - 99 mg/dL 811  914  782   BUN 8 - 23 mg/dL 29  34  37   Creatinine 0.61 - 1.24 mg/dL 9.56  2.13  0.86   Sodium 135 - 145 mmol/L 135  143  138   Potassium 3.5 - 5.1 mmol/L 4.2  3.9  4.2   Chloride 98 - 111 mmol/L 103  108  102   CO2 22 - 32 mmol/L 24  27  26    Calcium 8.9 - 10.3 mg/dL 9.2  9.5  9.3   Total Protein 6.5 - 8.1 g/dL 6.9     Total Bilirubin 0.3 - 1.2 mg/dL 0.3     Alkaline Phos 38 - 126 U/L 65     AST 15 - 41 U/L 26     ALT 0 - 44 U/L 23       Chronic lymphocytic leukemia, B cell, CD38 positive (78%).  Cytogenetics revealed Trisomy 12 IGVH unmutated.  RADIOGRAPHIC STUDIES: I have personally reviewed the radiological images as listed and agreed with the findings in the report. no recent images. 11/04/2017  US abdomen Stable right renal cyst. Status post cholecystectomy.Previously seen lesion within the liver is not well appreciated on this exam. No Splenomegaly.

## 2022-09-16 NOTE — Assessment & Plan Note (Signed)
CLL, intermediate risk with trisomy 50. IgVH unmutated. Stage III, extensive bone marrow involvement. Labs are reviewed and discussed with patient. WBC is trending down.  Patient tolerates treatment well. Recommend patient to hold off acalabrutinib 100 mg twice daily-due to leukopenia and thrombocytopenia.

## 2022-09-16 NOTE — Assessment & Plan Note (Signed)
Neutropenic precaution.  ANC 1.4.  Hold off acalabrutinib.

## 2022-09-16 NOTE — Assessment & Plan Note (Signed)
Labs are reviewed and discussed with patient. Stable M protein and normal light chain ration.  Continue observation. Repeat labs every 6 months.  

## 2022-10-14 ENCOUNTER — Inpatient Hospital Stay: Payer: Medicare Other | Attending: Oncology

## 2022-10-14 ENCOUNTER — Other Ambulatory Visit: Payer: Self-pay

## 2022-10-14 DIAGNOSIS — C9110 Chronic lymphocytic leukemia of B-cell type not having achieved remission: Secondary | ICD-10-CM | POA: Insufficient documentation

## 2022-10-14 LAB — CBC WITH DIFFERENTIAL (CANCER CENTER ONLY)
Abs Immature Granulocytes: 0.07 10*3/uL (ref 0.00–0.07)
Basophils Absolute: 0.1 10*3/uL (ref 0.0–0.1)
Basophils Relative: 1 %
Eosinophils Absolute: 0.4 10*3/uL (ref 0.0–0.5)
Eosinophils Relative: 4 %
HCT: 43.5 % (ref 39.0–52.0)
Hemoglobin: 14.4 g/dL (ref 13.0–17.0)
Immature Granulocytes: 1 %
Lymphocytes Relative: 36 %
Lymphs Abs: 4 10*3/uL (ref 0.7–4.0)
MCH: 34.1 pg — ABNORMAL HIGH (ref 26.0–34.0)
MCHC: 33.1 g/dL (ref 30.0–36.0)
MCV: 103.1 fL — ABNORMAL HIGH (ref 80.0–100.0)
Monocytes Absolute: 2.7 10*3/uL — ABNORMAL HIGH (ref 0.1–1.0)
Monocytes Relative: 25 %
Neutro Abs: 3.5 10*3/uL (ref 1.7–7.7)
Neutrophils Relative %: 33 %
Platelet Count: 127 10*3/uL — ABNORMAL LOW (ref 150–400)
RBC: 4.22 MIL/uL (ref 4.22–5.81)
RDW: 13.3 % (ref 11.5–15.5)
Smear Review: NORMAL
WBC Count: 10.8 10*3/uL — ABNORMAL HIGH (ref 4.0–10.5)
nRBC: 0 % (ref 0.0–0.2)

## 2022-10-15 ENCOUNTER — Other Ambulatory Visit: Payer: Self-pay

## 2022-10-15 ENCOUNTER — Telehealth: Payer: Self-pay

## 2022-10-15 DIAGNOSIS — C9110 Chronic lymphocytic leukemia of B-cell type not having achieved remission: Secondary | ICD-10-CM

## 2022-10-15 NOTE — Telephone Encounter (Signed)
-----   Message from Rickard Patience sent at 10/14/2022  4:49 PM EDT ----- Advise patient to resume Acalabrutinib 100mg  BID.  Repeat cbc in 2 weeks.  Keep same follow up thanks.  zy

## 2022-10-15 NOTE — Telephone Encounter (Signed)
Called and advised patient to resume Acalabrutinib 100mg  BID. Also informed him that Dr. Cathie Hoops would like to repeat a CBC in 2 weeks.

## 2022-10-29 ENCOUNTER — Inpatient Hospital Stay: Payer: Medicare Other | Attending: Oncology

## 2022-10-29 DIAGNOSIS — C9110 Chronic lymphocytic leukemia of B-cell type not having achieved remission: Secondary | ICD-10-CM | POA: Insufficient documentation

## 2022-10-29 LAB — CBC WITH DIFFERENTIAL (CANCER CENTER ONLY)
Abs Immature Granulocytes: 0.04 10*3/uL (ref 0.00–0.07)
Basophils Absolute: 0.1 10*3/uL (ref 0.0–0.1)
Basophils Relative: 0 %
Eosinophils Absolute: 0.2 10*3/uL (ref 0.0–0.5)
Eosinophils Relative: 2 %
HCT: 41 % (ref 39.0–52.0)
Hemoglobin: 13.4 g/dL (ref 13.0–17.0)
Immature Granulocytes: 0 %
Lymphocytes Relative: 69 %
Lymphs Abs: 8.6 10*3/uL — ABNORMAL HIGH (ref 0.7–4.0)
MCH: 34.3 pg — ABNORMAL HIGH (ref 26.0–34.0)
MCHC: 32.7 g/dL (ref 30.0–36.0)
MCV: 104.9 fL — ABNORMAL HIGH (ref 80.0–100.0)
Monocytes Absolute: 0.6 10*3/uL (ref 0.1–1.0)
Monocytes Relative: 4 %
Neutro Abs: 3.2 10*3/uL (ref 1.7–7.7)
Neutrophils Relative %: 25 %
Platelet Count: 110 10*3/uL — ABNORMAL LOW (ref 150–400)
RBC: 3.91 MIL/uL — ABNORMAL LOW (ref 4.22–5.81)
RDW: 13.4 % (ref 11.5–15.5)
Smear Review: NORMAL
WBC Count: 12.6 10*3/uL — ABNORMAL HIGH (ref 4.0–10.5)
nRBC: 0 % (ref 0.0–0.2)

## 2022-11-18 ENCOUNTER — Encounter: Payer: Self-pay | Admitting: Oncology

## 2022-11-18 ENCOUNTER — Inpatient Hospital Stay (HOSPITAL_BASED_OUTPATIENT_CLINIC_OR_DEPARTMENT_OTHER): Payer: Medicare Other | Admitting: Oncology

## 2022-11-18 ENCOUNTER — Inpatient Hospital Stay: Payer: Medicare Other | Attending: Oncology

## 2022-11-18 ENCOUNTER — Inpatient Hospital Stay: Payer: Medicare Other

## 2022-11-18 VITALS — BP 113/60 | HR 65 | Temp 97.4°F | Resp 18 | Wt 163.0 lb

## 2022-11-18 DIAGNOSIS — N1831 Chronic kidney disease, stage 3a: Secondary | ICD-10-CM | POA: Diagnosis not present

## 2022-11-18 DIAGNOSIS — D509 Iron deficiency anemia, unspecified: Secondary | ICD-10-CM | POA: Insufficient documentation

## 2022-11-18 DIAGNOSIS — D696 Thrombocytopenia, unspecified: Secondary | ICD-10-CM

## 2022-11-18 DIAGNOSIS — C9110 Chronic lymphocytic leukemia of B-cell type not having achieved remission: Secondary | ICD-10-CM | POA: Diagnosis present

## 2022-11-18 DIAGNOSIS — Z23 Encounter for immunization: Secondary | ICD-10-CM | POA: Insufficient documentation

## 2022-11-18 DIAGNOSIS — D472 Monoclonal gammopathy: Secondary | ICD-10-CM | POA: Insufficient documentation

## 2022-11-18 DIAGNOSIS — Z5111 Encounter for antineoplastic chemotherapy: Secondary | ICD-10-CM | POA: Diagnosis not present

## 2022-11-18 DIAGNOSIS — D701 Agranulocytosis secondary to cancer chemotherapy: Secondary | ICD-10-CM

## 2022-11-18 LAB — CBC WITH DIFFERENTIAL (CANCER CENTER ONLY)
Abs Immature Granulocytes: 0.05 10*3/uL (ref 0.00–0.07)
Basophils Absolute: 0.1 10*3/uL (ref 0.0–0.1)
Basophils Relative: 1 %
Eosinophils Absolute: 0.2 10*3/uL (ref 0.0–0.5)
Eosinophils Relative: 2 %
HCT: 41.5 % (ref 39.0–52.0)
Hemoglobin: 13.8 g/dL (ref 13.0–17.0)
Immature Granulocytes: 1 %
Lymphocytes Relative: 59 %
Lymphs Abs: 6.7 10*3/uL — ABNORMAL HIGH (ref 0.7–4.0)
MCH: 34.9 pg — ABNORMAL HIGH (ref 26.0–34.0)
MCHC: 33.3 g/dL (ref 30.0–36.0)
MCV: 105.1 fL — ABNORMAL HIGH (ref 80.0–100.0)
Monocytes Absolute: 0.7 10*3/uL (ref 0.1–1.0)
Monocytes Relative: 6 %
Neutro Abs: 3.4 10*3/uL (ref 1.7–7.7)
Neutrophils Relative %: 31 %
Platelet Count: 109 10*3/uL — ABNORMAL LOW (ref 150–400)
RBC: 3.95 MIL/uL — ABNORMAL LOW (ref 4.22–5.81)
RDW: 13.2 % (ref 11.5–15.5)
Smear Review: NORMAL
WBC Count: 11.1 10*3/uL — ABNORMAL HIGH (ref 4.0–10.5)
nRBC: 0 % (ref 0.0–0.2)

## 2022-11-18 LAB — CMP (CANCER CENTER ONLY)
ALT: 15 U/L (ref 0–44)
AST: 19 U/L (ref 15–41)
Albumin: 3.9 g/dL (ref 3.5–5.0)
Alkaline Phosphatase: 63 U/L (ref 38–126)
Anion gap: 10 (ref 5–15)
BUN: 32 mg/dL — ABNORMAL HIGH (ref 8–23)
CO2: 27 mmol/L (ref 22–32)
Calcium: 9.3 mg/dL (ref 8.9–10.3)
Chloride: 101 mmol/L (ref 98–111)
Creatinine: 1.33 mg/dL — ABNORMAL HIGH (ref 0.61–1.24)
GFR, Estimated: 51 mL/min — ABNORMAL LOW (ref >60)
Glucose, Bld: 151 mg/dL — ABNORMAL HIGH (ref 70–99)
Potassium: 3.6 mmol/L (ref 3.5–5.1)
Sodium: 138 mmol/L (ref 135–145)
Total Bilirubin: 0.8 mg/dL (ref 0.3–1.2)
Total Protein: 7.4 g/dL (ref 6.5–8.1)

## 2022-11-18 LAB — LACTATE DEHYDROGENASE: LDH: 95 U/L — ABNORMAL LOW (ref 98–192)

## 2022-11-18 MED ORDER — INFLUENZA VAC A&B SURF ANT ADJ 0.5 ML IM SUSY
0.5000 mL | PREFILLED_SYRINGE | Freq: Once | INTRAMUSCULAR | Status: AC
  Administered 2022-11-18: 0.5 mL via INTRAMUSCULAR
  Filled 2022-11-18: qty 0.5

## 2022-11-18 NOTE — Assessment & Plan Note (Signed)
Influenza vaccination today 

## 2022-11-18 NOTE — Assessment & Plan Note (Signed)
Treatment as planned above. 

## 2022-11-18 NOTE — Assessment & Plan Note (Signed)
Labs are reviewed and discussed with patient. Stable M protein and normal light chain ration.  Continue observation. Repeat labs every 6 months.  

## 2022-11-18 NOTE — Assessment & Plan Note (Signed)
CLL, intermediate risk with trisomy 3. IgVH unmutated. Stage III, extensive bone marrow involvement. Labs are reviewed and discussed with patient. Wbc is stable.  continue acalabrutinib 100 mg twice daily

## 2022-11-18 NOTE — Assessment & Plan Note (Signed)
avoid nephrotoxins. Encourage oral hydration 

## 2022-11-18 NOTE — Progress Notes (Signed)
Hematology/Oncology Follow Up Note [phone]   REASON FOR VISIT Follow up for CLL and iron deficiency anemia, MGUS   ASSESSMENT & PLAN:   Thrombocytopenia (HCC) Likely secondary to marrow suppression from acalabrutinib.   Improved after holding medication and currently stable after resumed treatment.  Continue monitor  Encounter for antineoplastic chemotherapy Treatment as planned above.  Chronic kidney disease (CKD) stage G3a/A1, moderately decreased glomerular filtration rate (GFR) between 45-59 mL/min/1.73 square meter and albuminuria creatinine ratio less than 30 mg/g (HCC) avoid nephrotoxins.  Encourage oral hydration.  MGUS (monoclonal gammopathy of unknown significance) Labs are reviewed and discussed with patient. Stable M protein and normal light chain ration.  Continue observation. Repeat labs every 6 months.   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 Stage III, extensive bone marrow involvement. Labs are reviewed and discussed with patient. Wbc is stable.  continue acalabrutinib 100 mg twice daily  Need for prophylactic vaccination and inoculation against influenza Influenza vaccination today  Orders Placed This Encounter  Procedures   CBC with Differential (Cancer Center Only)    Standing Status:   Future    Standing Expiration Date:   11/18/2023   CMP (Cancer Center only)    Standing Status:   Future    Standing Expiration Date:   11/18/2023   Follow up in  3 months All questions were answered. The patient knows to call the clinic with any problems, questions or concerns.  Rickard Patience, MD, PhD [facility] Hematology Oncology 11/18/2022     HISTORY OF PRESENTING ILLNESS:  Allen Chang 89 y.o.  male with PMH listed as below who was referred by primary care provider to me for evaluation of leukocytosis. He has a history of prostate cancer which was diagnosed 10 years ago. He underwent  brachytherapy. He is not any castration treatment. Per patient, he follows up with Dr.Wolf and most recent PSA is normal.   Patient also reports feeling fatigue lately. Recent labs show leukocytosis, mild anemia, macrocytosis, mild thrombocytopenia, low ferritin. Denies blood in the stool. He was started on over the counter iron supplement but wife who manages patient's medication decides to only let patient to take iron medication every other day.   # received IV iron Venofer x 4. Colonoscopy was done on 02/16/2017 which showed polyps, internal hemorrhoids, negative for malignancy.  ##Patient had bone marrow biopsy on 09/22/2018 Pathology showed hypercellular bone marrow with extensive involvement by chronic lymphocytic leukemia. # Started on acalabrutinib 100 mg twice daily on 10/01/2018. 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  #August 2020-acalabrutinib 100 mg twice daily Stopped in October 2021 during his admission.  # admitted from 12/16/19- 10/30/ 2021 due to sudden onset chest pain. Troponins were negative in the emergency room.  Initial EKG showed atrial fibrillation with heart rate of 55. Patient was seen by cardiology and clarified that the EKG and telemetry actually showed sinus rhythm with PACs patient did not require any anticoagulation.Echocardiogram showed normal EF and no wall motion abnormality.  Nuclear stress testing showed low risk study, EF 63%. Patient was discharged home and followed up with Dr. Juliann Pares on January 26, 2020 October 2021 -Dec 2023 off Acalabrutinib  03/04/2021 started on acalabrutinib 100mg  BID    INTERVAL HISTORY Allen Chang is a 89 y.o. male who has above history reviewed by me presents for 4 months follow up for CLL and iron deficiency anemia, MGUS Today patient was accompanied by his  wife.  Patient reports feeling well.  Appetite is good.   + Chronic fatigue. Denies weight loss, fever, chills, fatigue, night sweats.   Review  of Systems  Constitutional:  Negative for appetite change, chills, fatigue, fever and unexpected weight change.  HENT:   Negative for hearing loss and voice change.   Eyes:  Negative for eye problems and icterus.  Respiratory:  Negative for chest tightness, cough and shortness of breath.   Cardiovascular:  Negative for chest pain and leg swelling.  Gastrointestinal:  Positive for constipation. Negative for abdominal distention and abdominal pain.  Endocrine: Negative for hot flashes.  Genitourinary:  Negative for difficulty urinating, dysuria and frequency.   Musculoskeletal:  Positive for arthralgias and gait problem. Negative for back pain.  Skin:  Negative for itching and rash.       Skin cancer  Neurological:  Positive for gait problem. Negative for light-headedness and numbness.  Hematological:  Negative for adenopathy. Does not bruise/bleed easily.  Psychiatric/Behavioral:  Negative for confusion.     MEDICAL HISTORY:  Past Medical History:  Diagnosis Date   Cancer [facility])    prostate    CKD (chronic kidney disease) stage 3, GFR 30-59 ml/min (HCC) 10/01/2016   Complication of anesthesia    bradycardia, in ICU for 24 hour after galbladder surgery.    Diverticulosis 2012   Dysrhythmia    Heart skips a beat   Elevated lipids    GERD (gastroesophageal reflux disease)    History of hiatal hernia    Hypertension    Hypothyroidism    Leukemia (HCC)    Prostate cancer (HCC)    Skin cancer    face, scalp, behind ear,back and hand   Tremors of nervous system     SURGICAL HISTORY: Past Surgical History:  Procedure Laterality Date   BLADDER TUMOR EXCISION     CARDIAC CATHETERIZATION     CATARACT EXTRACTION W/PHACO Right 01/16/2022   Procedure: CATARACT EXTRACTION PHACO AND INTRAOCULAR LENS PLACEMENT (IOC) RIGHT DIABETIC KAHOOK DUAL BLADE GONIOTOMY 14.34 01:38.4;  Surgeon: Lockie Mola, MD;  Location: [facility];  Service: Ophthalmology;  Laterality: Right;    CATARACT EXTRACTION W/PHACO Left 01/29/2022   Procedure: CATARACT EXTRACTION PHACO AND INTRAOCULAR LENS PLACEMENT (IOC) LEFT DIABETIC KAHOOK DUAL BLADE GONIOTOMY MALYUGIN  12.91  01:31.0;  Surgeon: Lockie Mola, MD;  Location: [facility];  Service: Ophthalmology;  Laterality: Left;   CHOLECYSTECTOMY N/A 10/10/2014   Procedure: LAPAROSCOPIC CHOLECYSTECTOMY WITH INTRAOPERATIVE CHOLANGIOGRAM;  Surgeon: Nadeen Landau, MD;  Location: [facility] ORS;  Service: General;  Laterality: N/A;   COLONOSCOPY WITH PROPOFOL N/A 02/16/2017   Procedure: COLONOSCOPY WITH PROPOFOL;  Surgeon: Scot Jun, MD;  Location: [facility] END[facility];  Service: Endoscopy;  Laterality: N/A;   ESOPHAGOGASTRODUODENOSCOPY (EGD) WITH PROPOFOL N/A 02/16/2017   Procedure: ESOPHAGOGASTRODUODENOSCOPY (EGD) WITH PROPOFOL;  Surgeon: Scot Jun, MD;  Location: [facility] ENDOSCOPY;  Service: Endoscopy;  Laterality: N/A;   HEMORRHOID SURGERY     HERNIA REPAIR Left    x2   JOINT REPLACEMENT Left    Partial Knee Replacement, Dr. Joice Lofts   PARTIAL KNEE ARTHROPLASTY Left 12/12/2014   Procedure: UNICOMPARTMENTAL KNEE;  Surgeon: Christena Flake, MD;  Location: [facility] ORS;  Service: Orthopedics;  Laterality: Left;   PARTIAL KNEE ARTHROPLASTY Right 09/25/2020   Procedure: RIGHT PARTIAL KNEE REPLACEMENT;  Surgeon: Christena Flake, MD;  Location: [facility] ORS;  Service: Orthopedics;  Laterality: Right;   prostate seeding     SHOULDER ARTHROSCOPY Right  SHOULDER ARTHROSCOPY WITH OPEN ROTATOR CUFF REPAIR Left 02/07/2016   Procedure: SHOULDER ARTHROSCOPY WITH OPEN ROTATOR CUFF REPAIR;  Surgeon: Christena Flake, MD;  Location: [facility] ORS;  Service: Orthopedics;  Laterality: Left;    SOCIAL HISTORY: Social History   Socioeconomic History   Marital status: Married    Spouse name: Not on file   Number of children: Not on file   Years of education: Not on file   Highest education level: Not on file  Occupational History   Not on file  Tobacco  Use   Smoking status: Never   Smokeless tobacco: Never  Vaping Use   Vaping status: Never Used  Substance and Sexual Activity   Alcohol use: No   Drug use: No   Sexual activity: Yes  Other Topics Concern   Not on file  Social History Narrative   Not on file   Social Determinants of Health   Financial Resource Strain: Low Risk  (08/18/2022)   Received from [facility]   Overall Financial Resource Strain (CARDIA)    Difficulty of Paying Living Expenses: Not hard at all  Food Insecurity: No Food Insecurity (08/18/2022)   Received from [facility]   Hunger Vital Sign    Worried About Running Out of Food in the Last Year: Never true    Ran Out of Food in the Last Year: Never true  Transportation Needs: No Transportation Needs (08/18/2022)   Received from [facility] - Transportation    In the past 12 months, has lack of transportation kept you from medical appointments or from getting medications?: No    Lack of Transportation (Non-Medical): No  Physical Activity: Not on file  Stress: Not on file  Social Connections: Not on file  Intimate Partner Violence: Not on file    FAMILY HISTORY: Family History  Problem Relation Age of Onset   Bone cancer Father    Hypertension Mother    Osteoporosis Mother    Breast cancer Sister    Cancer Brother    Cancer Brother    Lung cancer Brother    Bladder Cancer Brother    Melanoma Brother     ALLERGIES:  is allergic to oxycodone-acetaminophen and voltaren [diclofenac sodium].  MEDICATIONS:  Current Outpatient Medications  Medication Sig Dispense Refill   acalabrutinib maleate (CALQUENCE) 100 MG tablet Take 1 tablet (100 mg total) by mouth 2 (two) times daily. 60 tablet 2   acetaminophen (TYLENOL) 500 MG tablet Take 1,000 mg by mouth every 8 (eight) hours as needed for moderate pain.     albuterol (VENTOLIN HFA) 108 (90 Base) MCG/ACT inhaler Inhale 2 puffs into the lungs  every 6 (six) hours as needed for wheezing or shortness of breath. 8 g 0   ascorbic acid (VITAMIN C) 1000 MG tablet Take 1,000 mg by mouth daily.     b complex vitamins capsule Take 1 capsule by mouth daily.     calcium carbonate (OSCAL) 1500 (600 Ca) MG TABS tablet Take 1,500 mg by mouth daily with breakfast.     Cholecalciferol 25 MCG (1000 UT) tablet Take 1,000 Units by mouth daily.     cyanocobalamin 100 MCG tablet Take 100 mcg by mouth daily.     donepezil (ARICEPT) 10 MG tablet Take 10 mg by mouth at bedtime.     DULoxetine (CYMBALTA) 20 MG capsule Take 20 mg by mouth daily.     famotidine (PEPCID) 40 MG tablet  Take 40 mg by mouth 2 (two) times daily.     ferrous sulfate 325 (65 FE) MG EC tablet Take 325 mg by mouth 3 (three) times daily with meals.     Flaxseed, Linseed, (FLAX SEED OIL) 1000 MG CAPS Take 1,000 mg by mouth daily.     fluorouracil (EFUDEX) 5 % cream Apply to the back of your right hand twice per day for 5 to 7 days. STOP when the area becomes red and irritated even if you haven't done the full 7 days.     furosemide (LASIX) 20 MG tablet Take 20 mg by mouth daily.     gabapentin (NEURONTIN) 100 MG capsule Take 1 capsule by mouth at bedtime.     Garlic 1000 MG CAPS Take 1 capsule by mouth every morning.     Ginkgo Biloba (GINKOBA PO) Take 2 tablets by mouth daily.     latanoprost (XALATAN) 0.005 % ophthalmic solution Place 1 drop into both eyes at bedtime.     levothyroxine (SYNTHROID) 50 MCG tablet Take 50 mcg by mouth daily before breakfast. Take on an empty stomach with a glass of water at least 30-60 minutes before breakfast.     loratadine (CLARITIN REDITABS) 10 MG dissolvable tablet Take 10 mg by mouth daily. In am     Misc Natural Products (BLACK CHERRY CONCENTRATE) LIQD Take 15 mLs by mouth daily.      MISC NATURAL PRODUCTS PO Take 2 capsules by mouth daily. Beet juice capsules     Omega-3 Fatty Acids (FISH OIL) 1000 MG CAPS Take 1 capsule by mouth daily.     OVER  THE COUNTER MEDICATION Take 1 capsule by mouth daily. Neuriva     OVER THE COUNTER MEDICATION Take 1 capsule by mouth daily. Ageless Brain     OVER THE COUNTER MEDICATION Take 1,000 mg by mouth daily. BACOPA     pantoprazole (PROTONIX) 40 MG tablet Take 40 mg by mouth 2 (two) times daily.     propranolol (INDERAL) 10 MG tablet Take 10 mg by mouth 2 (two) times daily.     vitamin E 180 MG (400 UNITS) capsule Take 400 Units by mouth daily.     zinc gluconate 50 MG tablet Take 50 mg by mouth daily.     No current facility-administered medications for this visit.      Marland Kitchen  PHYSICAL EXAMINATION: ECOG PERFORMANCE STATUS: 1 - Symptomatic but completely ambulatory Vitals:   11/18/22 1326  BP: 113/60  Pulse: 65  Resp: 18  Temp: (!) 97.4 F (36.3 C)   Filed Weights   11/18/22 1326  Weight: 163 lb (73.9 kg)    Physical Exam Constitutional:      General: He is not in acute distress.    Appearance: He is not diaphoretic.     Comments: Patient ambulates independently  HENT:     Head: Normocephalic and atraumatic.     Nose: Nose normal.     Mouth/Throat:     Pharynx: No oropharyngeal exudate.  Eyes:     General: No scleral icterus.    Pupils: Pupils are equal, round, and reactive to light.  Cardiovascular:     Rate and Rhythm: Normal rate.     Heart sounds: No murmur heard. Pulmonary:     Effort: Pulmonary effort is normal. No respiratory distress.     Breath sounds: No rales.  Chest:     Chest wall: No tenderness.  Abdominal:     General: There is  no distension.     Palpations: Abdomen is soft.     Tenderness: There is no abdominal tenderness.  Musculoskeletal:        General: Normal range of motion.     Cervical back: Normal range of motion and neck supple.     Comments: Status post right knee replacement  Skin:    General: Skin is warm and dry.  Neurological:     Mental Status: He is alert and oriented to person, place, and time.  Psychiatric:        Mood and Affect:  Affect normal.   .    LABORATORY DATA:  I have reviewed the data as listed    Latest Ref Rng & Units 11/18/2022    1:08 PM 10/29/2022    1:20 PM 10/14/2022    1:00 PM  CBC  WBC 4.0 - 10.5 K/uL 11.1  12.6  10.8   Hemoglobin 13.0 - 17.0 g/dL 16.1  09.6  04.5   Hematocrit 39.0 - 52.0 % 41.5  41.0  43.5   Platelets 150 - 400 K/uL 109  110  127       Latest Ref Rng & Units 11/18/2022    1:08 PM 09/16/2022   12:46 PM 08/01/2022    3:40 PM  CMP  Glucose 70 - 99 mg/dL 409  811  914   BUN 8 - 23 mg/dL 32  29  34   Creatinine 0.61 - 1.24 mg/dL 7.82  9.56  2.13   Sodium 135 - 145 mmol/L 138  135  143   Potassium 3.5 - 5.1 mmol/L 3.6  4.2  3.9   Chloride 98 - 111 mmol/L 101  103  108   CO2 22 - 32 mmol/L 27  24  27    Calcium 8.9 - 10.3 mg/dL 9.3  9.2  9.5   Total Protein 6.5 - 8.1 g/dL 7.4  6.9    Total Bilirubin 0.3 - 1.2 mg/dL 0.8  0.3    Alkaline Phos 38 - 126 U/L 63  65    AST 15 - 41 U/L 19  26    ALT 0 - 44 U/L 15  23      Chronic lymphocytic leukemia, B cell, CD38 positive (78%).  Cytogenetics revealed Trisomy 12 IGVH unmutated.  RADIOGRAPHIC STUDIES: I have personally reviewed the radiological images as listed and agreed with the findings in the report. no recent images. 11/04/2017  US abdomen Stable right renal cyst. Status post cholecystectomy.Previously seen lesion within the liver is not well appreciated on this exam. No Splenomegaly.

## 2022-11-18 NOTE — Assessment & Plan Note (Addendum)
Likely secondary to marrow suppression from acalabrutinib.   Improved after holding medication and currently stable after resumed treatment.  Continue monitor

## 2022-11-19 LAB — KAPPA/LAMBDA LIGHT CHAINS
Kappa free light chain: 30.2 mg/L — ABNORMAL HIGH (ref 3.3–19.4)
Kappa, lambda light chain ratio: 0.46 (ref 0.26–1.65)
Lambda free light chains: 65.4 mg/L — ABNORMAL HIGH (ref 5.7–26.3)

## 2022-11-20 LAB — MULTIPLE MYELOMA PANEL, SERUM
Albumin SerPl Elph-Mcnc: 3.9 g/dL (ref 2.9–4.4)
Albumin/Glob SerPl: 1.3 (ref 0.7–1.7)
Alpha 1: 0.2 g/dL (ref 0.0–0.4)
Alpha2 Glob SerPl Elph-Mcnc: 0.7 g/dL (ref 0.4–1.0)
B-Globulin SerPl Elph-Mcnc: 0.8 g/dL (ref 0.7–1.3)
Gamma Glob SerPl Elph-Mcnc: 1.4 g/dL (ref 0.4–1.8)
Globulin, Total: 3.1 g/dL (ref 2.2–3.9)
IgA: 98 mg/dL (ref 61–437)
IgG (Immunoglobin G), Serum: 1616 mg/dL — ABNORMAL HIGH (ref 603–1613)
IgM (Immunoglobulin M), Srm: 97 mg/dL (ref 15–143)
M Protein SerPl Elph-Mcnc: 1.1 g/dL — ABNORMAL HIGH
Total Protein ELP: 7 g/dL (ref 6.0–8.5)

## 2022-12-05 ENCOUNTER — Other Ambulatory Visit: Payer: Self-pay

## 2022-12-05 DIAGNOSIS — C9110 Chronic lymphocytic leukemia of B-cell type not having achieved remission: Secondary | ICD-10-CM

## 2022-12-05 MED ORDER — ACALABRUTINIB MALEATE 100 MG PO TABS
100.0000 mg | ORAL_TABLET | Freq: Two times a day (BID) | ORAL | 2 refills | Status: DC
Start: 2022-12-05 — End: 2023-03-03

## 2022-12-15 ENCOUNTER — Encounter: Payer: Self-pay | Admitting: Oncology

## 2023-01-13 ENCOUNTER — Telehealth: Payer: Self-pay

## 2023-01-13 NOTE — Telephone Encounter (Signed)
Oral Oncology Patient Advocate Encounter  **AZ&Me PAP to [facility] in Jan 2025**  Was successful in securing patient a $8,000.00 grant from [employer] to provide copayment coverage for [employer]  This will keep the out of pocket expense at $0.     [employer] ID: [id number]   The billing information is as follows and has been shared with [facility].    RxBin: [id number] PCN: [id number] Member ID: [id number] Group ID: [id number] Dates of Eligibility: 12/03/22 through 12/02/23  Fund:  Chronic Lymphocytic Leukemia   Ardeen Fillers, CPhT Oncology Pharmacy Patient Advocate  [facility]  [phone] (phone) [phone] (fax) 01/13/2023 1:57 PM

## 2023-01-19 IMAGING — DX DG KNEE 1-2V PORT*R*
3 series · 3 of 3 positions shown · non-contrast
Comparison: None.

CLINICAL DATA: Status post right partial knee replacement.

EXAM:
PORTABLE RIGHT KNEE - 1-2 VIEW

[knee ap]
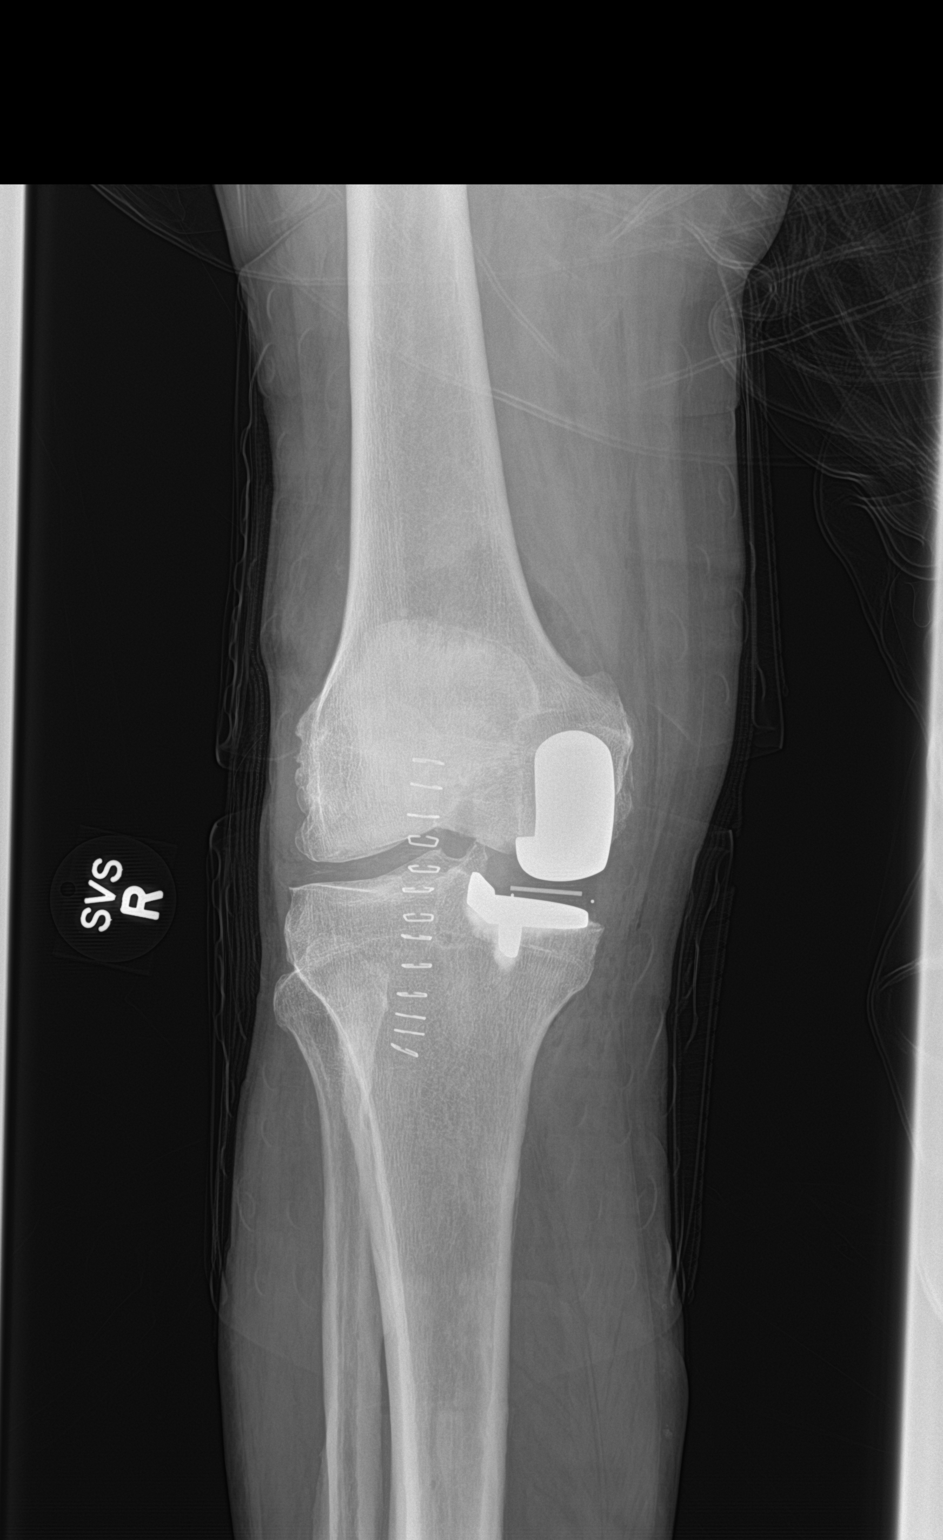

[knee lat]
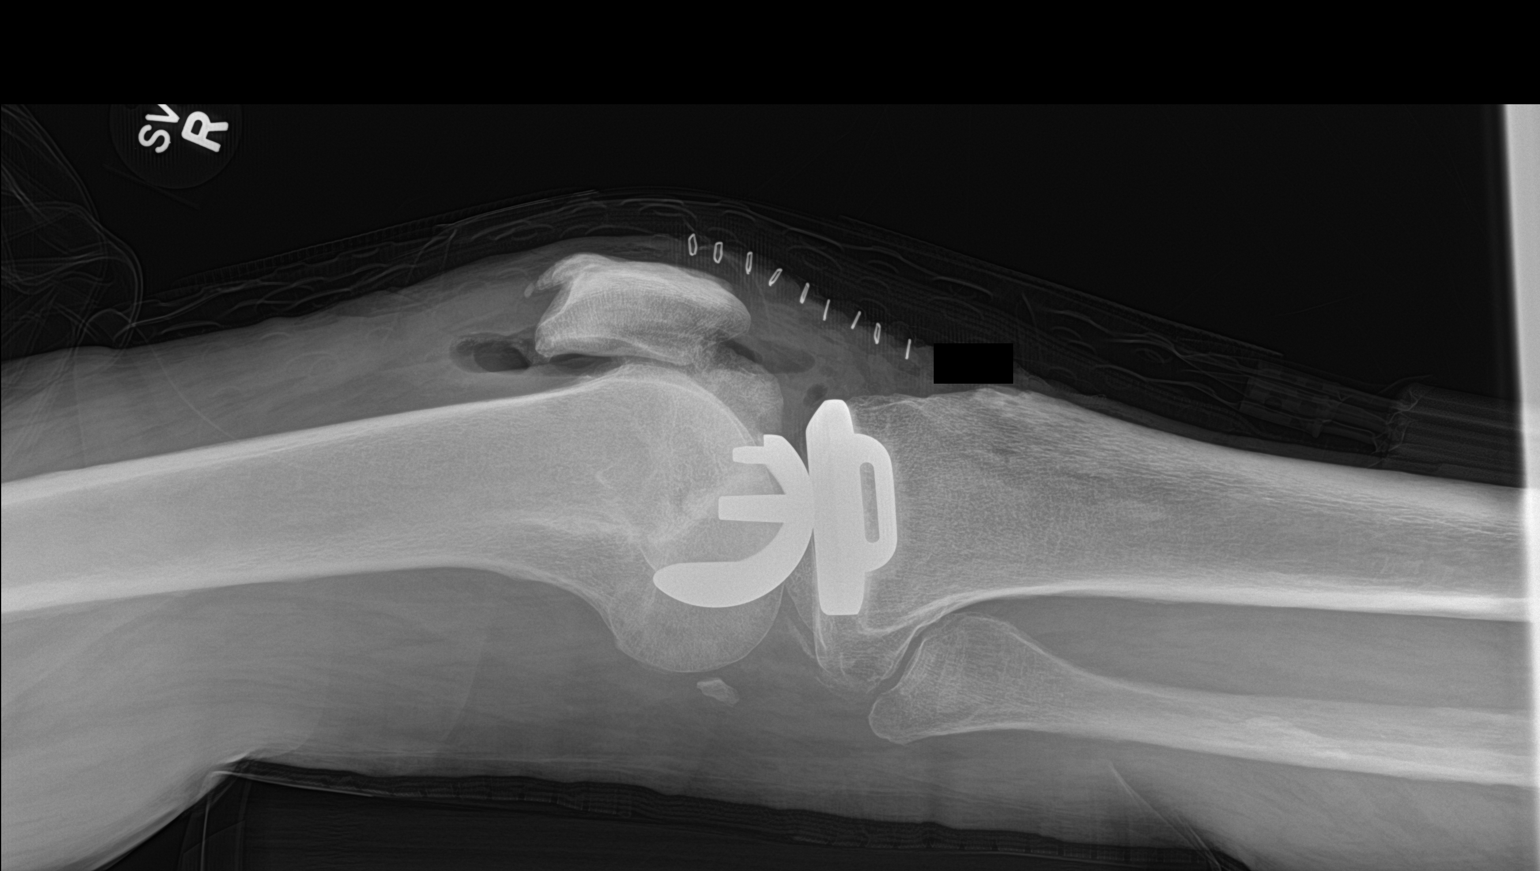

[knee obl]
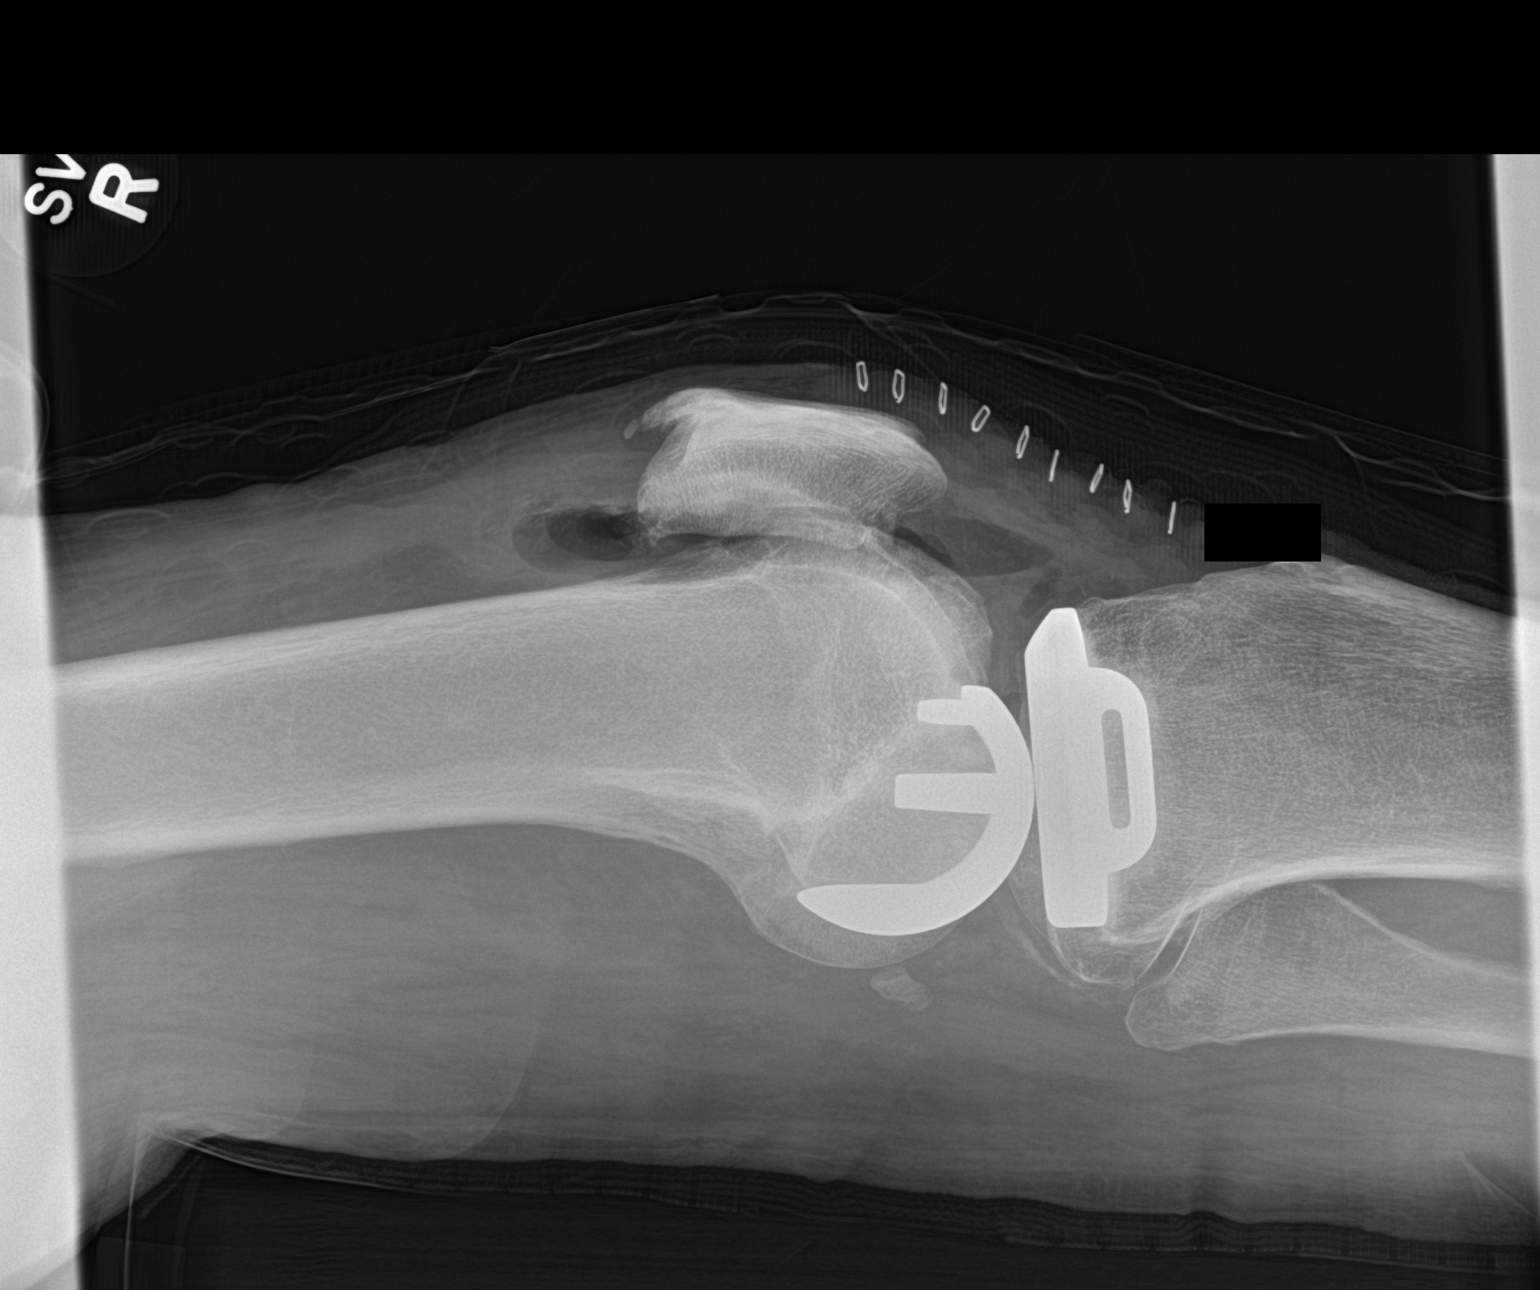

[3 of 3 positions shown; findings below may reference images not displayed]

FINDINGS: Sequelae of medial compartment arthroplasty are identified.
Postoperative gas is noted in the knee joint, and skin staples are
in place. No acute fracture or dislocation is identified. There is a
superior patellar enthesophyte.
IMPRESSION: Status post medial compartment arthroplasty.

## 2023-02-12 NOTE — Telephone Encounter (Signed)
Called and spoke with patient and wife, Nicole Cella, regarding grant for 2025. They know we will be transitioning over to [facility] for 2025. Patient's wife indicated that they had several months worth of medication on hand and requested I call back towards the end of February to get them OnBoarded with [facility]. Reminder has been set for Monday, 04/13/23.    Ardeen Fillers, CPhT Oncology Pharmacy Patient Advocate  [facility]  [phone] (phone) [phone] (fax) 02/12/2023 1:14 PM

## 2023-03-03 ENCOUNTER — Inpatient Hospital Stay (HOSPITAL_BASED_OUTPATIENT_CLINIC_OR_DEPARTMENT_OTHER): Payer: Medicare Other | Admitting: Oncology

## 2023-03-03 ENCOUNTER — Encounter: Payer: Self-pay | Admitting: Oncology

## 2023-03-03 ENCOUNTER — Inpatient Hospital Stay: Payer: Medicare Other | Attending: Oncology

## 2023-03-03 VITALS — BP 127/83 | HR 53 | Temp 97.2°F | Resp 18 | Wt 160.0 lb

## 2023-03-03 DIAGNOSIS — Z5111 Encounter for antineoplastic chemotherapy: Secondary | ICD-10-CM

## 2023-03-03 DIAGNOSIS — D696 Thrombocytopenia, unspecified: Secondary | ICD-10-CM | POA: Diagnosis not present

## 2023-03-03 DIAGNOSIS — D472 Monoclonal gammopathy: Secondary | ICD-10-CM

## 2023-03-03 DIAGNOSIS — D509 Iron deficiency anemia, unspecified: Secondary | ICD-10-CM | POA: Diagnosis present

## 2023-03-03 DIAGNOSIS — Z79899 Other long term (current) drug therapy: Secondary | ICD-10-CM | POA: Diagnosis not present

## 2023-03-03 DIAGNOSIS — C9110 Chronic lymphocytic leukemia of B-cell type not having achieved remission: Secondary | ICD-10-CM | POA: Insufficient documentation

## 2023-03-03 DIAGNOSIS — N1831 Chronic kidney disease, stage 3a: Secondary | ICD-10-CM

## 2023-03-03 LAB — CBC WITH DIFFERENTIAL (CANCER CENTER ONLY)
Abs Immature Granulocytes: 0.03 10*3/uL (ref 0.00–0.07)
Basophils Absolute: 0.1 10*3/uL (ref 0.0–0.1)
Basophils Relative: 1 %
Eosinophils Absolute: 0.1 10*3/uL (ref 0.0–0.5)
Eosinophils Relative: 1 %
HCT: 43.4 % (ref 39.0–52.0)
Hemoglobin: 14.6 g/dL (ref 13.0–17.0)
Immature Granulocytes: 0 %
Lymphocytes Relative: 50 %
Lymphs Abs: 5 10*3/uL — ABNORMAL HIGH (ref 0.7–4.0)
MCH: 35.2 pg — ABNORMAL HIGH (ref 26.0–34.0)
MCHC: 33.6 g/dL (ref 30.0–36.0)
MCV: 104.6 fL — ABNORMAL HIGH (ref 80.0–100.0)
Monocytes Absolute: 0.7 10*3/uL (ref 0.1–1.0)
Monocytes Relative: 7 %
Neutro Abs: 4.2 10*3/uL (ref 1.7–7.7)
Neutrophils Relative %: 41 %
Platelet Count: 121 10*3/uL — ABNORMAL LOW (ref 150–400)
RBC: 4.15 MIL/uL — ABNORMAL LOW (ref 4.22–5.81)
RDW: 12.9 % (ref 11.5–15.5)
Smear Review: NORMAL
WBC Count: 10.1 10*3/uL (ref 4.0–10.5)
nRBC: 0 % (ref 0.0–0.2)

## 2023-03-03 LAB — CMP (CANCER CENTER ONLY)
ALT: 22 U/L (ref 0–44)
AST: 22 U/L (ref 15–41)
Albumin: 4 g/dL (ref 3.5–5.0)
Alkaline Phosphatase: 63 U/L (ref 38–126)
Anion gap: 9 (ref 5–15)
BUN: 34 mg/dL — ABNORMAL HIGH (ref 8–23)
CO2: 26 mmol/L (ref 22–32)
Calcium: 9.5 mg/dL (ref 8.9–10.3)
Chloride: 104 mmol/L (ref 98–111)
Creatinine: 1.09 mg/dL (ref 0.61–1.24)
GFR, Estimated: >60 mL/min (ref >60)
Glucose, Bld: 99 mg/dL (ref 70–99)
Potassium: 4.4 mmol/L (ref 3.5–5.1)
Sodium: 139 mmol/L (ref 135–145)
Total Bilirubin: 1 mg/dL (ref 0.0–1.2)
Total Protein: 7.5 g/dL (ref 6.5–8.1)

## 2023-03-03 MED ORDER — ACALABRUTINIB MALEATE 100 MG PO TABS
100.0000 mg | ORAL_TABLET | Freq: Two times a day (BID) | ORAL | 5 refills | Status: DC
  Filled 2023-04-13: qty 60, 30d supply, fill #0
  Filled 2023-08-03: qty 60, 30d supply, fill #1
  Filled 2023-08-26: qty 60, 30d supply, fill #2

## 2023-03-03 NOTE — Assessment & Plan Note (Signed)
Treatment as planned above. 

## 2023-03-03 NOTE — Assessment & Plan Note (Signed)
CLL, intermediate risk with trisomy 3. IgVH unmutated. Stage III, extensive bone marrow involvement. Labs are reviewed and discussed with patient. Wbc is stable.  continue acalabrutinib 100 mg twice daily

## 2023-03-03 NOTE — Assessment & Plan Note (Signed)
 avoid nephrotoxins. Encourage oral hydration

## 2023-03-03 NOTE — Assessment & Plan Note (Signed)
Likely secondary to marrow suppression from acalabrutinib.   Improved after holding medication and currently stable after resumed treatment.  Continue monitor

## 2023-03-03 NOTE — Progress Notes (Signed)
 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 Stage III, extensive bone marrow involvement. Labs are reviewed and discussed with patient. Wbc is stable.  continue acalabrutinib  100 mg twice daily  Chronic kidney disease (CKD) stage G3a/A1, moderately decreased glomerular filtration rate (GFR) between 45-59 mL/min/1.73 square meter and albuminuria creatinine ratio less than 30 mg/g (HCC) avoid nephrotoxins.  Encourage oral hydration.  Encounter for antineoplastic chemotherapy Treatment as planned above.  MGUS (monoclonal gammopathy of unknown significance) Labs are reviewed and discussed with patient. Stable M protein and normal light chain ration.  Continue observation. Repeat labs every 6 months.   Thrombocytopenia (HCC) Likely secondary to marrow suppression from acalabrutinib .   Improved after holding medication and currently stable after resumed treatment.  Continue monitor  Orders Placed This Encounter  Procedures   CBC with Differential (Cancer Center Only)    Standing Status:   Future    Expected Date:   06/01/2023    Expiration Date:   03/02/2024   CMP (Cancer Center only)    Standing Status:   Future    Expected Date:   06/01/2023    Expiration Date:   03/02/2024   Multiple Myeloma Panel (SPEP&IFE w/QIG)    Standing Status:   Future    Expected Date:   06/01/2023    Expiration Date:   03/02/2024   Kappa/lambda light chains    Standing Status:   Future    Expected Date:   06/01/2023    Expiration Date:   03/02/2024   Follow up in  3 months All questions were answered. The patient knows to call the clinic with any problems, questions or concerns.  Zelphia Cap, MD, PhD [facility] Hematology Oncology 03/03/2023     HISTORY OF PRESENTING ILLNESS:  Allen A  Chang 89 y.o.  male with PMH listed as below who was referred by primary care provider to me for evaluation of leukocytosis. He has a history of prostate cancer which was diagnosed 10 years ago. He underwent brachytherapy. He is not any castration treatment. Per patient, he follows up with Dr.Wolf and most recent PSA is normal.   Patient also reports feeling fatigue lately. Recent labs show leukocytosis, mild anemia, macrocytosis, mild thrombocytopenia, low ferritin. Denies blood in the stool. He was started on over the counter iron  supplement but wife who manages patient's medication decides to only let patient to take iron  medication every other day.   # received IV iron  Venofer  x 4. Colonoscopy was done on 02/16/2017 which showed polyps, internal hemorrhoids, negative for malignancy.  ##Patient had bone marrow biopsy on 09/22/2018 Pathology showed hypercellular bone marrow with extensive involvement by chronic lymphocytic leukemia. # Started on acalabrutinib  100 mg twice daily on 10/01/2018. 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  #August 2020-acalabrutinib  100 mg twice daily Stopped in October 2021 during his admission.  # admitted from 12/16/19- 10/30/ 2021 due to sudden onset chest pain. Troponins were negative in the emergency room.  Initial EKG showed atrial fibrillation with heart rate of 55. Patient was seen by cardiology and clarified that the EKG and telemetry actually showed sinus rhythm with PACs patient did not require any anticoagulation.Echocardiogram showed normal EF and no wall motion abnormality.  Nuclear stress testing showed low risk study, EF 63%. Patient was discharged home  and followed up with Dr. Florencio on January 26, 2020 October 2021 -Dec 2023 off Acalabrutinib   03/04/2021 started on acalabrutinib  100mg  BID    INTERVAL HISTORY AMIERE Chang is a 89 y.o. male who has above history reviewed by me presents for 4 months follow up for CLL and iron   deficiency anemia, MGUS Today patient was accompanied by his wife.  Patient reports feeling well.   + Chronic fatigue. Denies  fever, chills, fatigue, night sweats.   Review of Systems  Constitutional:  Negative for appetite change, chills, fatigue, fever and unexpected weight change.  HENT:   Negative for hearing loss and voice change.   Eyes:  Negative for eye problems and icterus.  Respiratory:  Negative for chest tightness, cough and shortness of breath.   Cardiovascular:  Negative for chest pain and leg swelling.  Gastrointestinal:  Positive for constipation. Negative for abdominal distention and abdominal pain.  Endocrine: Negative for hot flashes.  Genitourinary:  Negative for difficulty urinating, dysuria and frequency.   Musculoskeletal:  Positive for arthralgias and gait problem. Negative for back pain.  Skin:  Negative for itching and rash.       Skin cancer  Neurological:  Positive for gait problem. Negative for light-headedness and numbness.  Hematological:  Negative for adenopathy. Does not bruise/bleed easily.  Psychiatric/Behavioral:  Negative for confusion.     MEDICAL HISTORY:  Past Medical History:  Diagnosis Date   Cancer [facility])    prostate    CKD (chronic kidney disease) stage 3, GFR 30-59 ml/min (HCC) 10/01/2016   Complication of anesthesia    bradycardia, in ICU for 24 hour after galbladder surgery.    Diverticulosis 2012   Dysrhythmia    Heart skips a beat   Elevated lipids    GERD (gastroesophageal reflux disease)    History of hiatal hernia    Hypertension    Hypothyroidism    Leukemia (HCC)    Prostate cancer (HCC)    Skin cancer    face, scalp, behind ear,back and hand   Tremors of nervous system     SURGICAL HISTORY: Past Surgical History:  Procedure Laterality Date   BLADDER TUMOR EXCISION     CARDIAC CATHETERIZATION     CATARACT EXTRACTION W/PHACO Right 01/16/2022   Procedure: CATARACT EXTRACTION PHACO AND INTRAOCULAR LENS PLACEMENT  (IOC) RIGHT DIABETIC KAHOOK DUAL BLADE GONIOTOMY 14.34 01:38.4;  Surgeon: Mittie Gaskin, MD;  Location: [facility];  Service: Ophthalmology;  Laterality: Right;   CATARACT EXTRACTION W/PHACO Left 01/29/2022   Procedure: CATARACT EXTRACTION PHACO AND INTRAOCULAR LENS PLACEMENT (IOC) LEFT DIABETIC KAHOOK DUAL BLADE GONIOTOMY MALYUGIN  12.91  01:31.0;  Surgeon: Mittie Gaskin, MD;  Location: [facility];  Service: Ophthalmology;  Laterality: Left;   CHOLECYSTECTOMY N/A 10/10/2014   Procedure: LAPAROSCOPIC CHOLECYSTECTOMY WITH INTRAOPERATIVE CHOLANGIOGRAM;  Surgeon: Larinda Unknown Sharps, MD;  Location: [facility] ORS;  Service: General;  Laterality: N/A;   COLONOSCOPY WITH PROPOFOL  N/A 02/16/2017   Procedure: COLONOSCOPY WITH PROPOFOL ;  Surgeon: Viktoria Lamar DASEN, MD;  Location: [facility];  Service: Endoscopy;  Laterality: N/A;   ESOPHAGOGASTRODUODENOSCOPY (EGD) WITH PROPOFOL  N/A 02/16/2017   Procedure: ESOPHAGOGASTRODUODENOSCOPY (EGD) WITH PROPOFOL ;  Surgeon: Viktoria Lamar DASEN, MD;  Location: [facility] ENDOSCOPY;  Service: Endoscopy;  Laterality: N/A;   HEMORRHOID SURGERY     HERNIA REPAIR Left    x2   JOINT REPLACEMENT Left    Partial Knee Replacement, Dr. Edie   PARTIAL KNEE ARTHROPLASTY Left 12/12/2014   Procedure: UNICOMPARTMENTAL KNEE;  Surgeon:  Norleen JINNY Maltos, MD;  Location: [facility] OR[facility];  Service: Orthopedics;  Laterality: Left;   PARTIAL KNEE ARTHROPLASTY Right 09/25/2020   Procedure: RIGHT PARTIAL KNEE REPLACEMENT;  Surgeon: Maltos Norleen JINNY, MD;  Location: [facility] OR[facility];  Service: Orthopedics;  Laterality: Right;   prostate seeding     SHOULDER ARTHROSCOPY Right    SHOULDER ARTHROSCOPY WITH OPEN ROTATOR CUFF REPAIR Left 02/07/2016   Procedure: SHOULDER ARTHROSCOPY WITH OPEN ROTATOR CUFF REPAIR;  Surgeon: Norleen JINNY Maltos, MD;  Location: [facility] OR[facility];  Service: Orthopedics;  Laterality: Left;    SOCIAL HISTORY: Social History   Socioeconomic History   Marital status: Married     Spouse name: Not on file   Number of children: Not on file   Years of education: Not on file   Highest education level: Not on file  Occupational History   Not on file  Tobacco Use   Smoking status: Never   Smokeless tobacco: Never  Vaping Use   Vaping status: Never Used  Substance and Sexual Activity   Alcohol use: No   Drug use: No   Sexual activity: Yes  Other Topics Concern   Not on file  Social History Narrative   Not on file   Social Drivers of Health   Financial Resource Strain: Low Risk  (01/22/2023)   Received [facility]   Overall Financial Resource Strain (CARDIA)    Difficulty of Paying Living Expenses: Not hard at all  Food Insecurity: No Food Insecurity (01/22/2023)   [facility] from [facility]   Hunger Vital Sign    Worried About Running Out of Food in the Last Year: Never true    Ran Out of Food in the Last Year: Never true  Transportation Needs: No Transportation Needs (01/22/2023)   Received from [facility] - Transportation    In the past 12 months, has lack of transportation kept you from medical appointments or from getting medications?: No    Lack of Transportation (Non-Medical): No  Physical Activity: Not on file  Stress: Not on file  Social Connections: Not on file  Intimate Partner Violence: Not on file    FAMILY HISTORY: Family History  Problem Relation Age of Onset   Bone cancer Father    Hypertension Mother    Osteoporosis Mother    Breast cancer Sister    Cancer Brother    Cancer Brother    Lung cancer Brother    Bladder Cancer Brother    Melanoma Brother     ALLERGIES:  is allergic to oxycodone -acetaminophen  and voltaren [diclofenac sodium].  MEDICATIONS:  Current Outpatient Medications  Medication Sig Dispense Refill   acetaminophen  (TYLENOL ) 500 MG tablet Take 1,000 mg by mouth every 8 (eight) hours as needed for moderate pain.     albuterol  (VENTOLIN  HFA) 108  (90 Base) MCG/ACT inhaler Inhale 2 puffs into the lungs every 6 (six) hours as needed for wheezing or shortness of breath. 8 g 0   ascorbic acid  (VITAMIN C) 1000 MG tablet Take 1,000 mg by mouth daily.     b complex vitamins capsule Take 1 capsule by mouth daily.     calcium  carbonate (OSCAL) 1500 (600 Ca) MG TABS tablet Take 1,500 mg by mouth daily with breakfast.     Cholecalciferol  25 MCG (1000 UT) tablet Take 1,000 Units by mouth daily.     cyanocobalamin  100 MCG tablet Take 100 mcg by mouth daily.     donepezil  (  ARICEPT ) 10 MG tablet Take 10 mg by mouth at bedtime.     DULoxetine  (CYMBALTA ) 20 MG capsule Take 20 mg by mouth daily.     famotidine  (PEPCID ) 40 MG tablet Take 40 mg by mouth 2 (two) times daily.     ferrous sulfate  325 (65 FE) MG EC tablet Take 325 mg by mouth 3 (three) times daily with meals.     Flaxseed, Linseed, (FLAX SEED OIL) 1000 MG CAPS Take 1,000 mg by mouth daily.     fluorouracil (EFUDEX) 5 % cream Apply to the back of your right hand twice per day for 5 to 7 days. STOP when the area becomes red and irritated even if you haven't done the full 7 days.     furosemide (LASIX) 20 MG tablet Take 20 mg by mouth daily.     gabapentin  (NEURONTIN ) 100 MG capsule Take 1 capsule by mouth at bedtime.     Garlic  1000 MG CAPS Take 1 capsule by mouth every morning.     Ginkgo Biloba (GINKOBA PO) Take 2 tablets by mouth daily.     latanoprost  (XALATAN ) 0.005 % ophthalmic solution Place 1 drop into both eyes at bedtime.     levothyroxine  (SYNTHROID ) 50 MCG tablet Take 50 mcg by mouth daily before breakfast. Take on an empty stomach with a glass of water  at least 30-60 minutes before breakfast.     loratadine  (CLARITIN  REDITABS) 10 MG dissolvable tablet Take 10 mg by mouth daily. In am     Misc Natural Products (BLACK CHERRY CONCENTRATE) LIQD Take 15 mLs by mouth daily.      MISC NATURAL PRODUCTS PO Take 2 capsules by mouth daily. Beet juice capsules     Omega-3 Fatty Acids (FISH OIL)  1000 MG CAPS Take 1 capsule by mouth daily.     OVER THE COUNTER MEDICATION Take 1 capsule by mouth daily. Neuriva     OVER THE COUNTER MEDICATION Take 1 capsule by mouth daily. Ageless Brain     OVER THE COUNTER MEDICATION Take 1,000 mg by mouth daily. BACOPA     pantoprazole  (PROTONIX ) 40 MG tablet Take 40 mg by mouth 2 (two) times daily.     propranolol  (INDERAL ) 10 MG tablet Take 10 mg by mouth 2 (two) times daily.     vitamin E  180 MG (400 UNITS) capsule Take 400 Units by mouth daily.     zinc  gluconate 50 MG tablet Take 50 mg by mouth daily.     acalabrutinib  maleate (CALQUENCE ) 100 MG tablet Take 1 tablet (100 mg total) by mouth 2 (two) times daily. 60 tablet 5   No current facility-administered medications for this visit.      SABRA  PHYSICAL EXAMINATION: ECOG PERFORMANCE STATUS: 1 - Symptomatic but completely ambulatory Vitals:   03/03/23 1319  BP: 127/83  Pulse: (!) 53  Resp: 18  Temp: (!) 97.2 F (36.2 C)   Filed Weights   03/03/23 1319  Weight: 160 lb (72.6 kg)    Physical Exam Constitutional:      General: He is not in acute distress.    Appearance: He is not diaphoretic.     Comments: Patient ambulates independently  HENT:     Head: Normocephalic and atraumatic.     Nose: Nose normal.     Mouth/Throat:     Pharynx: No oropharyngeal exudate.  Eyes:     General: No scleral icterus.    Pupils: Pupils are equal, round, and reactive to light.  Cardiovascular:     Rate and Rhythm: Normal rate.     Heart sounds: No murmur heard. Pulmonary:     Effort: Pulmonary effort is normal. No respiratory distress.     Breath sounds: No rales.  Chest:     Chest wall: No tenderness.  Abdominal:     General: There is no distension.     Palpations: Abdomen is soft.     Tenderness: There is no abdominal tenderness.  Musculoskeletal:        General: Normal range of motion.     Cervical back: Normal range of motion and neck supple.     Comments: Status post right knee  replacement  Skin:    General: Skin is warm and dry.  Neurological:     Mental Status: He is alert and oriented to person, place, and time.  Psychiatric:        Mood and Affect: Affect normal.   .    LABORATORY DATA:  I have reviewed the data as listed    Latest Ref Rng & Units 03/03/2023    1:02 PM 11/18/2022    1:08 PM 10/29/2022    1:20 PM  CBC  WBC 4.0 - 10.5 K/uL 10.1  11.1  12.6   Hemoglobin 13.0 - 17.0 g/dL 85.3  86.1  86.5   Hematocrit 39.0 - 52.0 % 43.4  41.5  41.0   Platelets 150 - 400 K/uL 121  109  110       Latest Ref Rng & Units 03/03/2023    1:02 PM 11/18/2022    1:08 PM 09/16/2022   12:46 PM  CMP  Glucose 70 - 99 mg/dL 99  848  898   BUN 8 - 23 mg/dL 34  32  29   Creatinine 0.61 - 1.24 mg/dL 8.90  8.66  8.80   Sodium 135 - 145 mmol/L 139  138  135   Potassium 3.5 - 5.1 mmol/L 4.4  3.6  4.2   Chloride 98 - 111 mmol/L 104  101  103   CO2 22 - 32 mmol/L 26  27  24    Calcium  8.9 - 10.3 mg/dL 9.5  9.3  9.2   Total Protein 6.5 - 8.1 g/dL 7.5  7.4  6.9   Total Bilirubin 0.0 - 1.2 mg/dL 1.0  0.8  0.3   Alkaline Phos 38 - 126 U/L 63  63  65   AST 15 - 41 U/L 22  19  26    ALT 0 - 44 U/L 22  15  23      Chronic lymphocytic leukemia, B cell, CD38 positive (78%).  Cytogenetics revealed Trisomy 12 IGVH unmutated.  RADIOGRAPHIC STUDIES: I have personally reviewed the radiological images as listed and agreed with the findings in the report. no recent images. 11/04/2017  US  abdomen Stable right renal cyst. Status post cholecystectomy.Previously seen lesion within the liver is not well appreciated on this exam. No Splenomegaly.

## 2023-03-03 NOTE — Assessment & Plan Note (Signed)
Labs are reviewed and discussed with patient. Stable M protein and normal light chain ration.  Continue observation. Repeat labs every 6 months.  

## 2023-04-13 ENCOUNTER — Other Ambulatory Visit: Payer: Self-pay

## 2023-04-13 ENCOUNTER — Other Ambulatory Visit: Payer: Self-pay | Admitting: Pharmacy Technician

## 2023-04-13 ENCOUNTER — Other Ambulatory Visit (HOSPITAL_COMMUNITY): Payer: Self-pay

## 2023-04-13 NOTE — Progress Notes (Signed)
 Specialty Pharmacy Initial Fill Coordination Note  Allen Chang is a 89 y.o. male contacted today regarding refills of specialty medication(s) Acalabrutinib Maleate (CALQUENCE) .  Patient requested Delivery  on 04/15/23  to verified address [name] [name] [address]   Medication will be filled on 04/14/23.   Patient is aware of $0 copayment.    Patty Almedia Balls, CPhT Oncology Pharmacy Patient Advocate [facility] Direct Number: [phone] Fax: [phone]

## 2023-04-13 NOTE — Progress Notes (Signed)
 Specialty Pharmacy Initiation Note   Allen Chang is a 89 y.o. male who will be followed by the specialty pharmacy service for RxSp Oncology    Review of administration, indication, effectiveness, safety, potential side effects, storage/disposable, and missed dose instructions occurred today for patient's specialty medication(s) Acalabrutinib Maleate (CALQUENCE)     Patient/Caregiver did not have any additional questions or concerns.   Patient's therapy is appropriate to: Continue    Goals Addressed             This Visit's Progress    Slow Disease Progression       Patient is on track. Patient will maintain adherence        Patient switching from PAP to filling at [facility] (Specialty)  [name] Specialty Pharmacist

## 2023-04-14 ENCOUNTER — Other Ambulatory Visit (HOSPITAL_COMMUNITY): Payer: Self-pay

## 2023-04-14 ENCOUNTER — Other Ambulatory Visit: Payer: Self-pay

## 2023-04-14 ENCOUNTER — Encounter: Payer: Self-pay | Admitting: Oncology

## 2023-05-05 ENCOUNTER — Other Ambulatory Visit: Payer: Self-pay

## 2023-05-07 ENCOUNTER — Other Ambulatory Visit: Payer: Self-pay

## 2023-06-02 ENCOUNTER — Inpatient Hospital Stay (HOSPITAL_BASED_OUTPATIENT_CLINIC_OR_DEPARTMENT_OTHER): Payer: Medicare Other | Admitting: Oncology

## 2023-06-02 ENCOUNTER — Encounter: Payer: Self-pay | Admitting: Oncology

## 2023-06-02 ENCOUNTER — Inpatient Hospital Stay: Payer: Medicare Other | Attending: Oncology

## 2023-06-02 VITALS — BP 127/80 | HR 85 | Temp 97.6°F | Resp 17 | Wt 164.8 lb

## 2023-06-02 DIAGNOSIS — D696 Thrombocytopenia, unspecified: Secondary | ICD-10-CM

## 2023-06-02 DIAGNOSIS — C9110 Chronic lymphocytic leukemia of B-cell type not having achieved remission: Secondary | ICD-10-CM

## 2023-06-02 DIAGNOSIS — Z8052 Family history of malignant neoplasm of bladder: Secondary | ICD-10-CM | POA: Insufficient documentation

## 2023-06-02 DIAGNOSIS — D472 Monoclonal gammopathy: Secondary | ICD-10-CM

## 2023-06-02 DIAGNOSIS — Z801 Family history of malignant neoplasm of trachea, bronchus and lung: Secondary | ICD-10-CM | POA: Insufficient documentation

## 2023-06-02 DIAGNOSIS — N1831 Chronic kidney disease, stage 3a: Secondary | ICD-10-CM

## 2023-06-02 DIAGNOSIS — Z5111 Encounter for antineoplastic chemotherapy: Secondary | ICD-10-CM

## 2023-06-02 DIAGNOSIS — D509 Iron deficiency anemia, unspecified: Secondary | ICD-10-CM | POA: Insufficient documentation

## 2023-06-02 DIAGNOSIS — Z803 Family history of malignant neoplasm of breast: Secondary | ICD-10-CM | POA: Diagnosis not present

## 2023-06-02 DIAGNOSIS — Z79899 Other long term (current) drug therapy: Secondary | ICD-10-CM | POA: Insufficient documentation

## 2023-06-02 DIAGNOSIS — Z808 Family history of malignant neoplasm of other organs or systems: Secondary | ICD-10-CM | POA: Insufficient documentation

## 2023-06-02 LAB — CMP (CANCER CENTER ONLY)
ALT: 18 U/L (ref 0–44)
AST: 19 U/L (ref 15–41)
Albumin: 3.6 g/dL (ref 3.5–5.0)
Alkaline Phosphatase: 58 U/L (ref 38–126)
Anion gap: 9 (ref 5–15)
BUN: 36 mg/dL — ABNORMAL HIGH (ref 8–23)
CO2: 24 mmol/L (ref 22–32)
Calcium: 9.2 mg/dL (ref 8.9–10.3)
Chloride: 105 mmol/L (ref 98–111)
Creatinine: 1.32 mg/dL — ABNORMAL HIGH (ref 0.61–1.24)
GFR, Estimated: 52 mL/min — ABNORMAL LOW (ref >60)
Glucose, Bld: 129 mg/dL — ABNORMAL HIGH (ref 70–99)
Potassium: 4.3 mmol/L (ref 3.5–5.1)
Sodium: 138 mmol/L (ref 135–145)
Total Bilirubin: 0.7 mg/dL (ref 0.0–1.2)
Total Protein: 7.3 g/dL (ref 6.5–8.1)

## 2023-06-02 LAB — CBC WITH DIFFERENTIAL (CANCER CENTER ONLY)
Abs Immature Granulocytes: 0.04 10*3/uL (ref 0.00–0.07)
Basophils Absolute: 0 10*3/uL (ref 0.0–0.1)
Basophils Relative: 1 %
Eosinophils Absolute: 0.1 10*3/uL (ref 0.0–0.5)
Eosinophils Relative: 1 %
HCT: 42.1 % (ref 39.0–52.0)
Hemoglobin: 13.8 g/dL (ref 13.0–17.0)
Immature Granulocytes: 1 %
Lymphocytes Relative: 35 %
Lymphs Abs: 2.2 10*3/uL (ref 0.7–4.0)
MCH: 34.8 pg — ABNORMAL HIGH (ref 26.0–34.0)
MCHC: 32.8 g/dL (ref 30.0–36.0)
MCV: 106 fL — ABNORMAL HIGH (ref 80.0–100.0)
Monocytes Absolute: 0.6 10*3/uL (ref 0.1–1.0)
Monocytes Relative: 9 %
Neutro Abs: 3.5 10*3/uL (ref 1.7–7.7)
Neutrophils Relative %: 53 %
Platelet Count: 102 10*3/uL — ABNORMAL LOW (ref 150–400)
RBC: 3.97 MIL/uL — ABNORMAL LOW (ref 4.22–5.81)
RDW: 12.8 % (ref 11.5–15.5)
Smear Review: DECREASED
WBC Count: 6.4 10*3/uL (ref 4.0–10.5)
nRBC: 0 % (ref 0.0–0.2)

## 2023-06-02 NOTE — Assessment & Plan Note (Signed)
 avoid nephrotoxins. Encourage oral hydration

## 2023-06-02 NOTE — Assessment & Plan Note (Signed)
 Likely secondary to marrow suppression from acalabrutinib.   currently stable, > 100,000 Continue monitor

## 2023-06-02 NOTE — Assessment & Plan Note (Signed)
Treatment as planned above. 

## 2023-06-02 NOTE — Progress Notes (Signed)
 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 Stage III, extensive bone marrow involvement. Labs are reviewed and discussed with patient. Wbc is stable.  continue acalabrutinib 100 mg twice daily  Chronic kidney disease (CKD) stage G3a/A1, moderately decreased glomerular filtration rate (GFR) between 45-59 mL/min/1.73 square meter and albuminuria creatinine ratio less than 30 mg/g (HCC) avoid nephrotoxins.  Encourage oral hydration.  Encounter for antineoplastic chemotherapy Treatment as planned above.  MGUS (monoclonal gammopathy of unknown significance) Labs are reviewed and discussed with patient. Stable M protein and normal light chain ration.  Continue observation. Repeat labs every 6 months.   Thrombocytopenia (HCC) Likely secondary to marrow suppression from acalabrutinib.   currently stable, > 100,000 Continue monitor  Orders Placed This Encounter  Procedures   CMP (Cancer Center only)    Standing Status:   Future    Expected Date:   09/01/2023    Expiration Date:   06/01/2024   CBC with Differential (Cancer Center Only)    Standing Status:   Future    Expected Date:   09/01/2023    Expiration Date:   06/01/2024   Follow up in  3 months All questions were answered. The patient knows to call the clinic with any problems, questions or concerns.  Rickard Patience, MD, PhD [facility] Hematology Oncology 06/02/2023     HISTORY OF PRESENTING ILLNESS:  Allen Chang 89 y.o.  male with PMH listed as below who was referred by primary care provider to me for evaluation of leukocytosis. He has a history of prostate cancer which was diagnosed 10 years ago. He underwent brachytherapy. He is not any castration treatment. Per patient, he follows up with Dr.Wolf and most recent  PSA is normal.   Patient also reports feeling fatigue lately. Recent labs show leukocytosis, mild anemia, macrocytosis, mild thrombocytopenia, low ferritin. Denies blood in the stool. He was started on over the counter iron supplement but wife who manages patient's medication decides to only let patient to take iron medication every other day.   # received IV iron Venofer x 4. Colonoscopy was done on 02/16/2017 which showed polyps, internal hemorrhoids, negative for malignancy.  ##Patient had bone marrow biopsy on 09/22/2018 Pathology showed hypercellular bone marrow with extensive involvement by chronic lymphocytic leukemia. # Started on acalabrutinib 100 mg twice daily on 10/01/2018. 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  #August 2020-acalabrutinib 100 mg twice daily Stopped in October 2021 during his admission.  # admitted from 12/16/19- 10/30/ 2021 due to sudden onset chest pain. Troponins were negative in the emergency room.  Initial EKG showed atrial fibrillation with heart rate of 55. Patient was seen by cardiology and clarified that the EKG and telemetry actually showed sinus rhythm with PACs patient did not require any anticoagulation.Echocardiogram showed normal EF and no wall motion abnormality.  Nuclear stress testing showed low risk study, EF 63%. Patient was discharged home and followed up with Dr. Juliann Pares on January 26, 2020 October 2021 -Dec 2023 off Acalabrutinib  03/04/2021 started on acalabrutinib 100mg  BID    INTERVAL HISTORY Allen Chang is a 89 y.o. male who has above history reviewed by me presents for 4 months follow up for CLL and iron deficiency anemia, MGUS Today patient was accompanied by his wife.  Patient reports feeling  well.   + Chronic fatigue. Denies  fever, chills, fatigue, night sweats.   Review of Systems  Constitutional:  Negative for appetite change, chills, fatigue, fever and unexpected weight change.  HENT:   Negative for  hearing loss and voice change.   Eyes:  Negative for eye problems and icterus.  Respiratory:  Negative for chest tightness, cough and shortness of breath.   Cardiovascular:  Negative for chest pain and leg swelling.  Gastrointestinal:  Positive for constipation. Negative for abdominal distention and abdominal pain.  Endocrine: Negative for hot flashes.  Genitourinary:  Negative for difficulty urinating, dysuria and frequency.   Musculoskeletal:  Positive for arthralgias and gait problem. Negative for back pain.  Skin:  Negative for itching and rash.       Skin cancer  Neurological:  Positive for gait problem. Negative for light-headedness and numbness.  Hematological:  Negative for adenopathy. Does not bruise/bleed easily.  Psychiatric/Behavioral:  Negative for confusion.     MEDICAL HISTORY:  Past Medical History:  Diagnosis Date   Cancer [facility])    prostate    CKD (chronic kidney disease) stage 3, GFR 30-59 ml/min (HCC) 10/01/2016   Complication of anesthesia    bradycardia, in ICU for 24 hour after galbladder surgery.    Diverticulosis 2012   Dysrhythmia    Heart skips a beat   Elevated lipids    GERD (gastroesophageal reflux disease)    History of hiatal hernia    Hypertension    Hypothyroidism    Leukemia (HCC)    Prostate cancer (HCC)    Skin cancer    face, scalp, behind ear,back and hand   Tremors of nervous system     SURGICAL HISTORY: Past Surgical History:  Procedure Laterality Date   BLADDER TUMOR EXCISION     CARDIAC CATHETERIZATION     CATARACT EXTRACTION W/PHACO Right 01/16/2022   Procedure: CATARACT EXTRACTION PHACO AND INTRAOCULAR LENS PLACEMENT (IOC) RIGHT DIABETIC KAHOOK DUAL BLADE GONIOTOMY 14.34 01:38.4;  Surgeon: Lockie Mola, MD;  Location: [facility];  Service: Ophthalmology;  Laterality: Right;   CATARACT EXTRACTION W/PHACO Left 01/29/2022   Procedure: CATARACT EXTRACTION PHACO AND INTRAOCULAR LENS PLACEMENT (IOC) LEFT DIABETIC  KAHOOK DUAL BLADE GONIOTOMY MALYUGIN  12.91  01:31.0;  Surgeon: Lockie Mola, MD;  Location: [facility];  Service: Ophthalmology;  Laterality: Left;   CHOLECYSTECTOMY N/A 10/10/2014   Procedure: LAPAROSCOPIC CHOLECYSTECTOMY WITH INTRAOPERATIVE CHOLANGIOGRAM;  Surgeon: Nadeen Landau, MD;  Location: [facility] ORS;  Service: General;  Laterality: N/A;   COLONOSCOPY WITH PROPOFOL N/A 02/16/2017   Procedure: COLONOSCOPY WITH PROPOFOL;  Surgeon: Scot Jun, MD;  Location: [facility] ENDOSCOPY;  Service: Endoscopy;  Laterality: N/A;   ESOPHAGOGASTRODUODENOSCOPY (EGD) WITH PROPOFOL N/A 02/16/2017   Procedure: ESOPHAGOGASTRODUODENOSCOPY (EGD) WITH PROPOFOL;  Surgeon: Scot Jun, MD;  Location: [facility] END[facility]COPY;  Service: Endoscopy;  Laterality: N/A;   HEMORRHOID SURGERY     HERNIA REPAIR Left    x2   JOINT REPLACEMENT Left    Partial Knee Replacement, Dr. Joice Lofts   PARTIAL KNEE ARTHROPLASTY Left 12/12/2014   Procedure: UNICOMPARTMENTAL KNEE;  Surgeon: Christena Flake, MD;  Location: [facility] ORS;  Service: Orthopedics;  Laterality: Left;   PARTIAL KNEE ARTHROPLASTY Right 09/25/2020   Procedure: RIGHT PARTIAL KNEE REPLACEMENT;  Surgeon: Christena Flake, MD;  Location: [facility] ORS;  Service: Orthopedics;  Laterality: Right;   prostate seeding     SHOULDER ARTHROSCOPY Right    SHOULDER ARTHROSCOPY WITH OPEN ROTATOR CUFF REPAIR Left 02/07/2016  Procedure: SHOULDER ARTHROSCOPY WITH OPEN ROTATOR CUFF REPAIR;  Surgeon: Christena Flake, MD;  Location: [facility] ORS;  Service: Orthopedics;  Laterality: Left;    SOCIAL HISTORY: Social History   Socioeconomic History   Marital status: Married    Spouse name: Not on file   Number of children: Not on file   Years of education: Not on file   Highest education level: Not on file  Occupational History   Not on file  Tobacco Use   Smoking status: Never   Smokeless tobacco: Never  Vaping Use   Vaping status: Never Used  Substance and Sexual Activity    Alcohol use: No   Drug use: No   Sexual activity: Yes  Other Topics Concern   Not on file  Social History Narrative   Not on file   Social Drivers of Health   Financial Resource Strain: Low Risk  (04/30/2023)   Received from [facility]   Overall Financial Resource Strain (CARDIA)    Difficulty of Paying Living Expenses: Not hard at all  Food Insecurity: No Food Insecurity (04/30/2023)   Received from [facility]   Hunger Vital Sign    Worried About Running Out of Food in the Last Year: Never true    Ran Out of Food in the Last Year: Never true  Transportation Needs: No Transportation Needs (04/30/2023)   Received from [facility] - Transportation    In the past 12 months, has lack of transportation kept you from medical appointments or from getting medications?: No    Lack of Transportation (Non-Medical): No  Physical Activity: Not on file  Stress: Not on file  Social Connections: Not on file  Intimate Partner Violence: Not on file    FAMILY HISTORY: Family History  Problem Relation Age of Onset   Bone cancer Father    Hypertension Mother    Osteoporosis Mother    Breast cancer Sister    Cancer Brother    Cancer Brother    Lung cancer Brother    Bladder Cancer Brother    Melanoma Brother     ALLERGIES:  is allergic to oxycodone-acetaminophen and voltaren [diclofenac sodium].  MEDICATIONS:  Current Outpatient Medications  Medication Sig Dispense Refill   acalabrutinib maleate (CALQUENCE) 100 MG tablet Take 1 tablet (100 mg total) by mouth 2 (two) times daily. 60 tablet 5   acetaminophen (TYLENOL) 500 MG tablet Take 1,000 mg by mouth every 8 (eight) hours as needed for moderate pain.     albuterol (VENTOLIN HFA) 108 (90 Base) MCG/ACT inhaler Inhale 2 puffs into the lungs every 6 (six) hours as needed for wheezing or shortness of breath. 8 g 0   ascorbic acid (VITAMIN C) 1000 MG tablet Take 1,000 mg by  mouth daily.     b complex vitamins capsule Take 1 capsule by mouth daily.     calcium carbonate (OSCAL) 1500 (600 Ca) MG TABS tablet Take 1,500 mg by mouth daily with breakfast.     Cholecalciferol 25 MCG (1000 UT) tablet Take 1,000 Units by mouth daily.     cyanocobalamin 100 MCG tablet Take 100 mcg by mouth daily.     donepezil (ARICEPT) 10 MG tablet Take 10 mg by mouth at bedtime.     DULoxetine (CYMBALTA) 20 MG capsule Take 20 mg by mouth daily.     famotidine (PEPCID) 40 MG tablet Take 40 mg by mouth 2 (two) times daily.  ferrous sulfate 325 (65 FE) MG EC tablet Take 325 mg by mouth 3 (three) times daily with meals.     Flaxseed, Linseed, (FLAX SEED OIL) 1000 MG CAPS Take 1,000 mg by mouth daily.     fluorouracil (EFUDEX) 5 % cream Apply to the back of your right hand twice per day for 5 to 7 days. STOP when the area becomes red and irritated even if you haven't done the full 7 days.     furosemide (LASIX) 20 MG tablet Take 20 mg by mouth daily.     gabapentin (NEURONTIN) 100 MG capsule Take 1 capsule by mouth at bedtime.     Garlic 1000 MG CAPS Take 1 capsule by mouth every morning.     Ginkgo Biloba (GINKOBA PO) Take 2 tablets by mouth daily.     latanoprost (XALATAN) 0.005 % ophthalmic solution Place 1 drop into both eyes at bedtime.     levothyroxine (SYNTHROID) 50 MCG tablet Take 50 mcg by mouth daily before breakfast. Take on an empty stomach with a glass of water at least 30-60 minutes before breakfast.     loratadine (CLARITIN REDITABS) 10 MG dissolvable tablet Take 10 mg by mouth daily. In am     Misc Natural Products (BLACK CHERRY CONCENTRATE) LIQD Take 15 mLs by mouth daily.      MISC NATURAL PRODUCTS PO Take 2 capsules by mouth daily. Beet juice capsules     Omega-3 Fatty Acids (FISH OIL) 1000 MG CAPS Take 1 capsule by mouth daily.     OVER THE COUNTER MEDICATION Take 1 capsule by mouth daily. Neuriva     OVER THE COUNTER MEDICATION Take 1 capsule by mouth daily. Ageless  Brain     OVER THE COUNTER MEDICATION Take 1,000 mg by mouth daily. BACOPA     pantoprazole (PROTONIX) 40 MG tablet Take 40 mg by mouth 2 (two) times daily.     propranolol (INDERAL) 10 MG tablet Take 10 mg by mouth 2 (two) times daily.     vitamin E 180 MG (400 UNITS) capsule Take 400 Units by mouth daily.     zinc gluconate 50 MG tablet Take 50 mg by mouth daily.     No current facility-administered medications for this visit.      Aaron Aas  PHYSICAL EXAMINATION: ECOG PERFORMANCE STATUS: 1 - Symptomatic but completely ambulatory Vitals:   06/02/23 1326  BP: 127/80  Pulse: 85  Resp: 17  Temp: 97.6 F (36.4 C)  SpO2: 93%   Filed Weights   06/02/23 1326  Weight: 164 lb 12.8 oz (74.8 kg)    Physical Exam Constitutional:      General: He is not in acute distress.    Appearance: He is not diaphoretic.     Comments: Patient ambulates independently  HENT:     Head: Normocephalic and atraumatic.     Nose: Nose normal.     Mouth/Throat:     Pharynx: No oropharyngeal exudate.  Eyes:     General: No scleral icterus.    Pupils: Pupils are equal, round, and reactive to light.  Cardiovascular:     Rate and Rhythm: Normal rate.     Heart sounds: No murmur heard. Pulmonary:     Effort: Pulmonary effort is normal. No respiratory distress.     Breath sounds: No rales.  Chest:     Chest wall: No tenderness.  Abdominal:     General: There is no distension.     Palpations: Abdomen is  soft.     Tenderness: There is no abdominal tenderness.  Musculoskeletal:        General: Normal range of motion.     Cervical back: Normal range of motion and neck supple.     Comments: Status post right knee replacement  Skin:    General: Skin is warm and dry.  Neurological:     Mental Status: He is alert and oriented to person, place, and time.  Psychiatric:        Mood and Affect: Affect normal.   .    LABORATORY DATA:  I have reviewed the data as listed    Latest Ref Rng & Units  06/02/2023    1:08 PM 03/03/2023    1:02 PM 11/18/2022    1:08 PM  CBC  WBC 4.0 - 10.5 K/uL 6.4  10.1  11.1   Hemoglobin 13.0 - 17.0 g/dL 84.1  32.4  40.1   Hematocrit 39.0 - 52.0 % 42.1  43.4  41.5   Platelets 150 - 400 K/uL 102  121  109       Latest Ref Rng & Units 06/02/2023    1:08 PM 03/03/2023    1:02 PM 11/18/2022    1:08 PM  CMP  Glucose 70 - 99 mg/dL 027  99  253   BUN 8 - 23 mg/dL 36  34  32   Creatinine 0.61 - 1.24 mg/dL 6.64  4.03  4.74   Sodium 135 - 145 mmol/L 138  139  138   Potassium 3.5 - 5.1 mmol/L 4.3  4.4  3.6   Chloride 98 - 111 mmol/L 105  104  101   CO2 22 - 32 mmol/L 24  26  27    Calcium 8.9 - 10.3 mg/dL 9.2  9.5  9.3   Total Protein 6.5 - 8.1 g/dL 7.3  7.5  7.4   Total Bilirubin 0.0 - 1.2 mg/dL 0.7  1.0  0.8   Alkaline Phos 38 - 126 U/L 58  63  63   AST 15 - 41 U/L 19  22  19    ALT 0 - 44 U/L 18  22  15      Chronic lymphocytic leukemia, B cell, CD38 positive (78%).  Cytogenetics revealed Trisomy 12 IGVH unmutated.  RADIOGRAPHIC STUDIES: I have personally reviewed the radiological images as listed and agreed with the findings in the report. no recent images. 11/04/2017  US  abdomen Stable right renal cyst. Status post cholecystectomy.Previously seen lesion within the liver is not well appreciated on this exam. No Splenomegaly.

## 2023-06-02 NOTE — Assessment & Plan Note (Signed)
CLL, intermediate risk with trisomy 3. IgVH unmutated. Stage III, extensive bone marrow involvement. Labs are reviewed and discussed with patient. Wbc is stable.  continue acalabrutinib 100 mg twice daily

## 2023-06-02 NOTE — Assessment & Plan Note (Signed)
Labs are reviewed and discussed with patient. Stable M protein and normal light chain ration.  Continue observation. Repeat labs every 6 months.  

## 2023-06-03 LAB — KAPPA/LAMBDA LIGHT CHAINS
Kappa free light chain: 23.1 mg/L — ABNORMAL HIGH (ref 3.3–19.4)
Kappa, lambda light chain ratio: 0.3 (ref 0.26–1.65)
Lambda free light chains: 77.5 mg/L — ABNORMAL HIGH (ref 5.7–26.3)

## 2023-06-04 LAB — MULTIPLE MYELOMA PANEL, SERUM
Albumin SerPl Elph-Mcnc: 3.6 g/dL (ref 2.9–4.4)
Albumin/Glob SerPl: 1.2 (ref 0.7–1.7)
Alpha 1: 0.2 g/dL (ref 0.0–0.4)
Alpha2 Glob SerPl Elph-Mcnc: 0.7 g/dL (ref 0.4–1.0)
B-Globulin SerPl Elph-Mcnc: 0.8 g/dL (ref 0.7–1.3)
Gamma Glob SerPl Elph-Mcnc: 1.5 g/dL (ref 0.4–1.8)
Globulin, Total: 3.2 g/dL (ref 2.2–3.9)
IgA: 99 mg/dL (ref 61–437)
IgG (Immunoglobin G), Serum: 1618 mg/dL — ABNORMAL HIGH (ref 603–1613)
IgM (Immunoglobulin M), Srm: 88 mg/dL (ref 15–143)
M Protein SerPl Elph-Mcnc: 1 g/dL — ABNORMAL HIGH
Total Protein ELP: 6.8 g/dL (ref 6.0–8.5)

## 2023-07-07 ENCOUNTER — Other Ambulatory Visit: Payer: Self-pay

## 2023-07-09 ENCOUNTER — Other Ambulatory Visit: Payer: Self-pay

## 2023-07-30 ENCOUNTER — Other Ambulatory Visit: Payer: Self-pay

## 2023-08-03 ENCOUNTER — Other Ambulatory Visit: Payer: Self-pay

## 2023-08-03 NOTE — Progress Notes (Signed)
 Specialty Pharmacy Refill Coordination Note  Allen Chang is a 89 y.o. male contacted today regarding refills of specialty medication(s) Acalabrutinib  Maleate (CALQUENCE )   Patient requested Delivery   Delivery date: 08/04/23   Verified address: [address] [name] [address]   Medication will be filled on 08/03/23.

## 2023-08-06 ENCOUNTER — Encounter: Payer: Self-pay | Admitting: *Deleted

## 2023-08-06 ENCOUNTER — Emergency Department

## 2023-08-06 ENCOUNTER — Emergency Department
Admission: EM | Admit: 2023-08-06 | Discharge: 2023-08-06 | Disposition: A | Discharge status: Home / Self Care | Attending: Emergency Medicine | Admitting: Emergency Medicine

## 2023-08-06 ENCOUNTER — Other Ambulatory Visit: Payer: Self-pay

## 2023-08-06 DIAGNOSIS — R079 Chest pain, unspecified: Secondary | ICD-10-CM | POA: Insufficient documentation

## 2023-08-06 LAB — CBC
HCT: 40.2 % (ref 39.0–52.0)
Hemoglobin: 13.3 g/dL (ref 13.0–17.0)
MCH: 34.5 pg — ABNORMAL HIGH (ref 26.0–34.0)
MCHC: 33.1 g/dL (ref 30.0–36.0)
MCV: 104.1 fL — ABNORMAL HIGH (ref 80.0–100.0)
Platelets: 112 10*3/uL — ABNORMAL LOW (ref 150–400)
RBC: 3.86 MIL/uL — ABNORMAL LOW (ref 4.22–5.81)
RDW: 12.6 % (ref 11.5–15.5)
WBC: 6.8 10*3/uL (ref 4.0–10.5)
nRBC: 0 % (ref 0.0–0.2)

## 2023-08-06 LAB — BASIC METABOLIC PANEL WITH GFR
Anion gap: 11 (ref 5–15)
BUN: 42 mg/dL — ABNORMAL HIGH (ref 8–23)
CO2: 25 mmol/L (ref 22–32)
Calcium: 10.6 mg/dL — ABNORMAL HIGH (ref 8.9–10.3)
Chloride: 103 mmol/L (ref 98–111)
Creatinine, Ser: 1.21 mg/dL (ref 0.61–1.24)
GFR, Estimated: 57 mL/min — ABNORMAL LOW (ref >60)
Glucose, Bld: 131 mg/dL — ABNORMAL HIGH (ref 70–99)
Potassium: 3.7 mmol/L (ref 3.5–5.1)
Sodium: 139 mmol/L (ref 135–145)

## 2023-08-06 LAB — TROPONIN I (HIGH SENSITIVITY)
Troponin I (High Sensitivity): 10 ng/L (ref <18)
Troponin I (High Sensitivity): 9 ng/L (ref <18)

## 2023-08-06 LAB — PROTIME-INR
INR: 1.1 (ref 0.8–1.2)
Prothrombin Time: 14.3 s (ref 11.4–15.2)

## 2023-08-06 NOTE — ED Triage Notes (Signed)
 Pt to triage via wheelchair.  Pt has chest pain.  No sob.  No n/v  pt was riding the mower today and pain began afterwards at 1600 today.  Pt alert  speech clear.

## 2023-08-06 NOTE — ED Provider Notes (Signed)
 [facility] Provider Note    Event Date/Time   First MD Initiated Contact with Patient 08/06/23 2051     (approximate)   History   Chest Pain   HPI  Allen Chang is a 89 y.o. male who presents to the emergency department today because of concerns for chest pain.  Pain started this afternoon.  Patient denies any unusual exertion earlier today although did use his riding mower.  He does this however every week.  The patient cannot describe the quality of the pain.  Indicated to family that was located in the lower central chest.  At the time my exam the patient states he is pain-free.  He denies any associated shortness of breath or diaphoresis.  No nausea or vomiting.     Physical Exam   Triage Vital Signs: ED Triage Vitals  Encounter Vitals Group     BP 08/06/23 2003 (!) 134/98     Girls Systolic BP Percentile --      Girls Diastolic BP Percentile --      Boys Systolic BP Percentile --      Boys Diastolic BP Percentile --      Pulse Rate 08/06/23 2003 74     Resp 08/06/23 2003 18     Temp 08/06/23 2003 97.6 F (36.4 C)     Temp Source 08/06/23 2003 Oral     SpO2 08/06/23 2003 100 %     Weight 08/06/23 2000 164 lb (74.4 kg)     Height 08/06/23 2000 5' 9 (1.753 m)     Head Circumference --      Peak Flow --      Pain Score 08/06/23 1959 3     Pain Loc --      Pain Education --      Exclude from Growth Chart --     Most recent vital signs: Vitals:   08/06/23 2003  BP: (!) 134/98  Pulse: 74  Resp: 18  Temp: 97.6 F (36.4 C)  SpO2: 100%    General: Awake, alert, oriented. CV:  Good peripheral perfusion. Regular rate and rhythm. Resp:  Normal effort. Lungs clear. Abd:  No distention. No tenderness.   ED Results / Procedures / Treatments   Labs (all labs ordered are listed, but only abnormal results are displayed) Labs Reviewed  BASIC METABOLIC PANEL WITH GFR - Abnormal; Notable for the following components:      Result Value    Glucose, Bld 131 (*)    BUN 42 (*)    Calcium  10.6 (*)    GFR, Estimated 57 (*)    All other components within normal limits  CBC - Abnormal; Notable for the following components:   RBC 3.86 (*)    MCV 104.1 (*)    MCH 34.5 (*)    Platelets 112 (*)    All other components within normal limits  PROTIME-INR  TROPONIN I (HIGH SENSITIVITY)     EKG  I, Marylynn Soho, attending physician, personally viewed and interpreted this EKG  EKG Time: 2004 Rate: 67 Rhythm: atrial fibrillation Axis: left axis deviation Intervals: qtc 390 QRS: narrow ST changes: no st elevation Impression: abnormal ekg   RADIOLOGY I independently interpreted and visualized the CXR. My interpretation: No pneumonia. Radiology interpretation:  IMPRESSION:  1. No acute cardiopulmonary abnormality.  2. Redemonstrated, large hiatal hernia.      PROCEDURES:  Critical Care performed: No  MEDICATIONS ORDERED IN ED: Medications - No data to  display   IMPRESSION / MDM / ASSESSMENT AND PLAN / ED COURSE  I reviewed the triage vital signs and the nursing notes.                              Differential diagnosis includes, but is not limited to, ACS, pneumonia, esophagitis  Patient's presentation is most consistent with acute presentation with potential threat to life or bodily function.  Patient presents to the emergency department today because of concern for chest pain. Patient is pain free at the time of my exam. Work up here with troponin negative x 2. Cxr without pneumonia. I have low suspicion for PE or dissection given clinical history.  Patient with known hiatal hernia which I do wonder would be the cause of the patient's symptoms.  Given the patient is pain-free and his workup is reassuring I think is reasonable and safe for patient be discharged.     FINAL CLINICAL IMPRESSION(S) / ED DIAGNOSES   Final diagnoses:  Nonspecific chest pain       Note:  This document was prepared using  Dragon voice recognition software and may include unintentional dictation errors.    Marylynn Soho, MD 08/06/23 [phone]

## 2023-08-20 ENCOUNTER — Other Ambulatory Visit: Payer: Self-pay

## 2023-08-25 ENCOUNTER — Emergency Department

## 2023-08-25 ENCOUNTER — Emergency Department
Admission: EM | Admit: 2023-08-25 | Discharge: 2023-08-25 | Disposition: A | Discharge status: Home / Self Care | Attending: Emergency Medicine | Admitting: Emergency Medicine

## 2023-08-25 DIAGNOSIS — N183 Chronic kidney disease, stage 3 unspecified: Secondary | ICD-10-CM | POA: Insufficient documentation

## 2023-08-25 DIAGNOSIS — R1032 Left lower quadrant pain: Secondary | ICD-10-CM | POA: Diagnosis present

## 2023-08-25 DIAGNOSIS — Z856 Personal history of leukemia: Secondary | ICD-10-CM | POA: Diagnosis not present

## 2023-08-25 LAB — CBC
HCT: 41.7 % (ref 39.0–52.0)
Hemoglobin: 13.7 g/dL (ref 13.0–17.0)
MCH: 34.2 pg — ABNORMAL HIGH (ref 26.0–34.0)
MCHC: 32.9 g/dL (ref 30.0–36.0)
MCV: 104 fL — ABNORMAL HIGH (ref 80.0–100.0)
Platelets: 107 K/uL — ABNORMAL LOW (ref 150–400)
RBC: 4.01 MIL/uL — ABNORMAL LOW (ref 4.22–5.81)
RDW: 13.1 % (ref 11.5–15.5)
WBC: 7.2 K/uL (ref 4.0–10.5)
nRBC: 0 % (ref 0.0–0.2)

## 2023-08-25 LAB — COMPREHENSIVE METABOLIC PANEL WITH GFR
ALT: 18 U/L (ref 0–44)
AST: 20 U/L (ref 15–41)
Albumin: 4 g/dL (ref 3.5–5.0)
Alkaline Phosphatase: 54 U/L (ref 38–126)
Anion gap: 10 (ref 5–15)
BUN: 47 mg/dL — ABNORMAL HIGH (ref 8–23)
CO2: 25 mmol/L (ref 22–32)
Calcium: 10.6 mg/dL — ABNORMAL HIGH (ref 8.9–10.3)
Chloride: 105 mmol/L (ref 98–111)
Creatinine, Ser: 1.2 mg/dL (ref 0.61–1.24)
GFR, Estimated: 58 mL/min — ABNORMAL LOW (ref >60)
Glucose, Bld: 138 mg/dL — ABNORMAL HIGH (ref 70–99)
Potassium: 4.2 mmol/L (ref 3.5–5.1)
Sodium: 140 mmol/L (ref 135–145)
Total Bilirubin: 0.9 mg/dL (ref 0.0–1.2)
Total Protein: 7.7 g/dL (ref 6.5–8.1)

## 2023-08-25 LAB — URINALYSIS, ROUTINE W REFLEX MICROSCOPIC
Bilirubin Urine: NEGATIVE
Glucose, UA: NEGATIVE mg/dL
Hgb urine dipstick: NEGATIVE
Ketones, ur: NEGATIVE mg/dL
Leukocytes,Ua: NEGATIVE
Nitrite: NEGATIVE
Protein, ur: NEGATIVE mg/dL
Specific Gravity, Urine: 1.032 — ABNORMAL HIGH (ref 1.005–1.030)
pH: 5 (ref 5.0–8.0)

## 2023-08-25 LAB — TROPONIN I (HIGH SENSITIVITY)
Troponin I (High Sensitivity): 10 ng/L (ref <18)
Troponin I (High Sensitivity): 10 ng/L (ref <18)

## 2023-08-25 LAB — LIPASE, BLOOD: Lipase: 37 U/L (ref 11–51)

## 2023-08-25 MED ORDER — MORPHINE SULFATE (PF) 4 MG/ML IV SOLN
4.0000 mg | Freq: Once | INTRAVENOUS | Status: AC
  Administered 2023-08-25: 4 mg via INTRAVENOUS
  Filled 2023-08-25: qty 1

## 2023-08-25 MED ORDER — IOHEXOL 300 MG/ML  SOLN
100.0000 mL | Freq: Once | INTRAMUSCULAR | Status: AC | PRN
  Administered 2023-08-25: 100 mL via INTRAVENOUS

## 2023-08-25 NOTE — ED Notes (Addendum)
 Pt given a urinal to collect a urine sample. Pt insists that he does not have to go right now but will try. Family at bedside.

## 2023-08-25 NOTE — ED Triage Notes (Signed)
 Patient to ED via [facility] from home for left sided abd pain. Started 2 weeks ago. Recently seen for same and dx with a fib.

## 2023-08-25 NOTE — ED Notes (Signed)
 Reviewed D/C information with the patient & family, all persons verbalized understanding. No additional concerns at this time.

## 2023-08-25 NOTE — ED Triage Notes (Signed)
 Pt presents to the ED via [facility] from home for left sided abdominal pain that radiated into the left side of his chest. Pt reports that the abdominal pain is still about an 8/10.

## 2023-08-25 NOTE — Discharge Instructions (Addendum)
 CT imaging shows no evidence of any bowel obstruction or other infections in your abdomen.  No signs of infection in your urine.  Your laboratory workup is otherwise reassuring and heart markers are normal today.  This could have potentially been constipation or bowel spasms.  Otherwise do not see any life-threatening issues at this time.  You can increase your dose of MiraLAX at home and follow-up with your PCP.  Please return for any severe or worsening symptoms.  Separately, there was an incidental finding of a spot along your pancreas which is very small.  There is no obvious indication of what this actually is at this time but you can follow-up with your primary care provider.  Often people get an MRI 1 to 2 years after the fact to see if there is any changes in this.  This would not be the source of any of your symptoms today.

## 2023-08-25 NOTE — ED Provider Notes (Signed)
 [facility] Provider Note    Event Date/Time   First MD Initiated Contact with Patient 08/25/23 1623     (approximate)   History   Abdominal Pain   HPI Allen Chang is a 89 y.o. male with history of CLL, CKD stage III presenting today for left lower quadrant abdominal pain.  States pain started earlier on the left side of his abdomen and is now localized to the left lower quadrant.  Felt he might of had chest pain but also might of been from his abdominal pain but that is no longer present.  Denies nausea, vomiting, shortness of breath, diarrhea, constipation, dysuria.  No prior abdominal surgeries aside from a cholecystectomy.     Physical Exam   Triage Vital Signs: ED Triage Vitals [08/25/23 1520]  Encounter Vitals Group     BP (!) 149/76     Girls Systolic BP Percentile      Girls Diastolic BP Percentile      Boys Systolic BP Percentile      Boys Diastolic BP Percentile      Pulse Rate (!) 57     Resp 18     Temp 97.6 F (36.4 C)     Temp Source Oral     SpO2 98 %     Weight 163 lb 2.3 oz (74 kg)     Height 5' 9 (1.753 m)     Head Circumference      Peak Flow      Pain Score 8     Pain Loc      Pain Education      Exclude from Growth Chart     Most recent vital signs: Vitals:   08/25/23 1830 08/25/23 1900  BP: 127/65 130/74  Pulse: (!) 52 67  Resp:  18  Temp:  97.7 F (36.5 C)  SpO2: 93% 97%   Physical Exam: I have reviewed the vital signs and nursing notes. General: Awake, alert, no acute distress.  Nontoxic appearing. Head:  Atraumatic, normocephalic.   ENT:  EOM intact, PERRL. Oral mucosa is pink and moist with no lesions. Neck: Neck is supple with full range of motion, No meningeal signs. Cardiovascular:  RRR, No murmurs. Peripheral pulses palpable and equal bilaterally. Respiratory:  Symmetrical chest wall expansion.  No rhonchi, rales, or wheezes.  Good air movement throughout.  No use of accessory muscles.    Musculoskeletal:  No cyanosis or edema. Moving extremities with full ROM Abdomen:  Soft, sharp tenderness to palpation in the left lower quadrant with mild tenderness elsewhere, nondistended. Neuro:  GCS 15, moving all four extremities, interacting appropriately. Speech clear. Psych:  Calm, appropriate.   Skin:  Warm, dry, no rash.    ED Results / Procedures / Treatments   Labs (all labs ordered are listed, but only abnormal results are displayed) Labs Reviewed  COMPREHENSIVE METABOLIC PANEL WITH GFR - Abnormal; Notable for the following components:      Result Value   Glucose, Bld 138 (*)    BUN 47 (*)    Calcium  10.6 (*)    GFR, Estimated 58 (*)    All other components within normal limits  CBC - Abnormal; Notable for the following components:   RBC 4.01 (*)    MCV 104.0 (*)    MCH 34.2 (*)    Platelets 107 (*)    All other components within normal limits  URINALYSIS, ROUTINE W REFLEX MICROSCOPIC - Abnormal; Notable for the following  components:   Color, Urine YELLOW (*)    APPearance CLEAR (*)    Specific Gravity, Urine 1.032 (*)    All other components within normal limits  LIPASE, BLOOD  TROPONIN I (HIGH SENSITIVITY)  TROPONIN I (HIGH SENSITIVITY)     EKG My EKG interpretation: Rate of 64, atrial fibrillation.  No acute ST elevations or depressions   RADIOLOGY Independently interpreted CT abdomen/pelvis with no acute pathology   PROCEDURES:  Critical Care performed: No  Procedures   MEDICATIONS ORDERED IN ED: Medications  morphine  (PF) 4 MG/ML injection 4 mg (4 mg Intravenous Given 08/25/23 1720)  iohexol  (OMNIPAQUE ) 300 MG/ML solution 100 mL (100 mLs Intravenous Contrast Given 08/25/23 1839)     IMPRESSION / MDM / ASSESSMENT AND PLAN / ED COURSE  I reviewed the triage vital signs and the nursing notes.                              Differential diagnosis includes, but is not limited to, diverticulitis, SBO, colitis, enteritis, lower suspicion for  appendicitis, low suspicion for ACS  Patient's presentation is most consistent with acute complicated illness / injury requiring diagnostic workup.  Patient is an 89 year old male presenting today for acute onset of left lower quadrant abdominal pain.  Sharp tenderness there with mild tenderness elsewhere.  No other associated symptoms and vital signs are stable.  Laboratory workup largely reassuring from a CBC, CMP, and lipase standpoint.  Will get CT abdomen/pelvis to evaluate for acute intra-abdominal infection and patient given morphine  for pain.  CT abdomen/pelvis negative.  He was reassessed with no ongoing pain symptoms.  Subsequent UA shows no signs of infection either.  Patient having no ongoing pain symptoms.  No obvious life-threatening pathology and he is otherwise stable.  Will discharge at this time.  Does indicate history of constipation and recommend they increase his laxative dose at home and follow-up with PCP.  Given strict return precautions.  He was made aware of incidental finding on his pancreas seen on CT.  The patient is on the cardiac monitor to evaluate for evidence of arrhythmia and/or significant heart rate changes. Clinical Course as of 08/25/23 2117  Tue Aug 25, 2023  1904 Reassessed.  No ongoing pain symptoms.  Awaiting CT abdomen/pelvis result [DW]  2010 CT ABDOMEN PELVIS W CONTRAST No acute CT findings [DW]  2116 Reassessed and still asymptomatic.  Will discharge at this time [DW]    Clinical Course User Index [DW] Malvina Alm DASEN, MD     FINAL CLINICAL IMPRESSION(S) / ED DIAGNOSES   Final diagnoses:  Left lower quadrant abdominal pain     Rx / DC Orders   ED Discharge Orders     None        Note:  This document was prepared using Dragon voice recognition software and may include unintentional dictation errors.   Malvina Alm DASEN, MD 08/25/23 2119

## 2023-08-26 ENCOUNTER — Other Ambulatory Visit: Payer: Self-pay

## 2023-08-26 NOTE — Progress Notes (Signed)
 Specialty Pharmacy Refill Coordination Note  Allen Chang is a 89 y.o. male contacted today regarding refills of specialty medication(s) Acalabrutinib  Maleate (CALQUENCE )   Patient requested Delivery   Delivery date: 09/02/23   Verified address: [address] [place] [address]   Medication will be filled on 09/01/23.

## 2023-09-09 ENCOUNTER — Encounter: Payer: Self-pay | Admitting: Oncology

## 2023-09-09 ENCOUNTER — Inpatient Hospital Stay (HOSPITAL_BASED_OUTPATIENT_CLINIC_OR_DEPARTMENT_OTHER): Admitting: Oncology

## 2023-09-09 ENCOUNTER — Inpatient Hospital Stay: Attending: Oncology

## 2023-09-09 VITALS — BP 120/88 | HR 112 | Temp 97.0°F | Resp 16 | Ht 69.0 in | Wt 161.0 lb

## 2023-09-09 DIAGNOSIS — Z801 Family history of malignant neoplasm of trachea, bronchus and lung: Secondary | ICD-10-CM | POA: Insufficient documentation

## 2023-09-09 DIAGNOSIS — C9110 Chronic lymphocytic leukemia of B-cell type not having achieved remission: Secondary | ICD-10-CM | POA: Diagnosis not present

## 2023-09-09 DIAGNOSIS — D472 Monoclonal gammopathy: Secondary | ICD-10-CM | POA: Insufficient documentation

## 2023-09-09 DIAGNOSIS — I4891 Unspecified atrial fibrillation: Secondary | ICD-10-CM | POA: Diagnosis not present

## 2023-09-09 DIAGNOSIS — E1122 Type 2 diabetes mellitus with diabetic chronic kidney disease: Secondary | ICD-10-CM | POA: Insufficient documentation

## 2023-09-09 DIAGNOSIS — N1831 Chronic kidney disease, stage 3a: Secondary | ICD-10-CM

## 2023-09-09 DIAGNOSIS — Z79890 Hormone replacement therapy (postmenopausal): Secondary | ICD-10-CM | POA: Diagnosis not present

## 2023-09-09 DIAGNOSIS — D696 Thrombocytopenia, unspecified: Secondary | ICD-10-CM | POA: Diagnosis not present

## 2023-09-09 DIAGNOSIS — D692 Other nonthrombocytopenic purpura: Secondary | ICD-10-CM | POA: Insufficient documentation

## 2023-09-09 DIAGNOSIS — I129 Hypertensive chronic kidney disease with stage 1 through stage 4 chronic kidney disease, or unspecified chronic kidney disease: Secondary | ICD-10-CM | POA: Diagnosis not present

## 2023-09-09 DIAGNOSIS — Z8546 Personal history of malignant neoplasm of prostate: Secondary | ICD-10-CM | POA: Insufficient documentation

## 2023-09-09 DIAGNOSIS — Z803 Family history of malignant neoplasm of breast: Secondary | ICD-10-CM | POA: Diagnosis not present

## 2023-09-09 DIAGNOSIS — D509 Iron deficiency anemia, unspecified: Secondary | ICD-10-CM | POA: Diagnosis not present

## 2023-09-09 DIAGNOSIS — E039 Hypothyroidism, unspecified: Secondary | ICD-10-CM | POA: Insufficient documentation

## 2023-09-09 DIAGNOSIS — Z8249 Family history of ischemic heart disease and other diseases of the circulatory system: Secondary | ICD-10-CM | POA: Insufficient documentation

## 2023-09-09 DIAGNOSIS — Z7901 Long term (current) use of anticoagulants: Secondary | ICD-10-CM | POA: Insufficient documentation

## 2023-09-09 DIAGNOSIS — Z79899 Other long term (current) drug therapy: Secondary | ICD-10-CM | POA: Diagnosis not present

## 2023-09-09 LAB — CBC WITH DIFFERENTIAL (CANCER CENTER ONLY)
Abs Immature Granulocytes: 0.03 K/uL (ref 0.00–0.07)
Basophils Absolute: 0.1 K/uL (ref 0.0–0.1)
Basophils Relative: 1 %
Eosinophils Absolute: 0.1 K/uL (ref 0.0–0.5)
Eosinophils Relative: 2 %
HCT: 40.7 % (ref 39.0–52.0)
Hemoglobin: 13.6 g/dL (ref 13.0–17.0)
Immature Granulocytes: 0 %
Lymphocytes Relative: 33 %
Lymphs Abs: 2.4 K/uL (ref 0.7–4.0)
MCH: 34.5 pg — ABNORMAL HIGH (ref 26.0–34.0)
MCHC: 33.4 g/dL (ref 30.0–36.0)
MCV: 103.3 fL — ABNORMAL HIGH (ref 80.0–100.0)
Monocytes Absolute: 0.7 K/uL (ref 0.1–1.0)
Monocytes Relative: 10 %
Neutro Abs: 4 K/uL (ref 1.7–7.7)
Neutrophils Relative %: 54 %
Platelet Count: 107 K/uL — ABNORMAL LOW (ref 150–400)
RBC: 3.94 MIL/uL — ABNORMAL LOW (ref 4.22–5.81)
RDW: 13 % (ref 11.5–15.5)
WBC Count: 7.3 K/uL (ref 4.0–10.5)
nRBC: 0 % (ref 0.0–0.2)

## 2023-09-09 LAB — CMP (CANCER CENTER ONLY)
ALT: 19 U/L (ref 0–44)
AST: 17 U/L (ref 15–41)
Albumin: 4.1 g/dL (ref 3.5–5.0)
Alkaline Phosphatase: 59 U/L (ref 38–126)
Anion gap: 5 (ref 5–15)
BUN: 44 mg/dL — ABNORMAL HIGH (ref 8–23)
CO2: 26 mmol/L (ref 22–32)
Calcium: 9.7 mg/dL (ref 8.9–10.3)
Chloride: 105 mmol/L (ref 98–111)
Creatinine: 1.15 mg/dL (ref 0.61–1.24)
GFR, Estimated: >60 mL/min (ref >60)
Glucose, Bld: 109 mg/dL — ABNORMAL HIGH (ref 70–99)
Potassium: 4 mmol/L (ref 3.5–5.1)
Sodium: 136 mmol/L (ref 135–145)
Total Bilirubin: 0.8 mg/dL (ref 0.0–1.2)
Total Protein: 7.5 g/dL (ref 6.5–8.1)

## 2023-09-09 NOTE — Assessment & Plan Note (Signed)
 Worsened due to acalabrutinib  as well as Eliquis. Holding paraproteinemia.  Recommend patient to wear long sleeves

## 2023-09-09 NOTE — Progress Notes (Signed)
 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 Stage III, extensive bone marrow involvement. Labs are reviewed and discussed with patient. Wbc is stable.  Given increasing bruising as well as decline of performance status due to cognitive decline, I recommend to stop acalabrutinib  100 mg twice daily and monitor his counts.  Consider repeat in the future if recurrence.  Chronic kidney disease (CKD) stage G3a/A1, moderately decreased glomerular filtration rate (GFR) between 45-59 mL/min/1.73 square meter and albuminuria creatinine ratio less than 30 mg/g (HCC) avoid nephrotoxins.  Encourage oral hydration.  MGUS (monoclonal gammopathy of unknown significance) Labs are reviewed and discussed with patient. Stable M protein and normal light chain ration.  Continue observation. Repeat labs every 6 months.   Thrombocytopenia (HCC) Likely secondary to marrow suppression from acalabrutinib .    Continue monitor  Senile purpura (HCC) Worsened due to acalabrutinib  as well as Eliquis. Holding paraproteinemia.  Recommend patient to wear long sleeves  Orders Placed This Encounter  Procedures   CMP (Cancer Center only)    Standing Status:   Future    Expected Date:   12/10/2023    Expiration Date:   03/09/2024   CBC with Differential (Cancer Center Only)    Standing Status:   Future    Expected Date:   12/10/2023    Expiration Date:   03/09/2024   Follow up in  3 months All questions were answered. The patient knows to call the clinic with any problems, questions or concerns.  Zelphia Cap, MD, PhD [facility] Hematology Oncology 09/09/2023     HISTORY OF PRESENTING ILLNESS:  Allen Chang 89 y.o.  male with PMH listed as below who was referred by primary care provider to me for  evaluation of leukocytosis. He has a history of prostate cancer which was diagnosed 10 years ago. He underwent brachytherapy. He is not any castration treatment. Per patient, he follows up with Dr.Wolf and most recent PSA is normal.   Patient also reports feeling fatigue lately. Recent labs show leukocytosis, mild anemia, macrocytosis, mild thrombocytopenia, low ferritin. Denies blood in the stool. He was started on over the counter iron  supplement but wife who manages patient's medication decides to only let patient to take iron  medication every other day.   # received IV iron  Venofer  x 4. Colonoscopy was done on 02/16/2017 which showed polyps, internal hemorrhoids, negative for malignancy.  ##Patient had bone marrow biopsy on 09/22/2018 Pathology showed hypercellular bone marrow with extensive involvement by chronic lymphocytic leukemia. # Started on acalabrutinib  100 mg twice daily on 10/01/2018. 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  #August 2020-acalabrutinib  100 mg twice daily Stopped in October 2021 during his admission.  # admitted from 12/16/19- 10/30/ 2021 due to sudden onset chest pain. Troponins were negative in the emergency room.  Initial EKG showed atrial fibrillation with heart rate of 55. Patient was seen by cardiology and clarified that the EKG and telemetry actually showed sinus rhythm with PACs patient did not require any anticoagulation.Echocardiogram showed normal EF and no wall motion abnormality.  Nuclear stress testing showed low risk study, EF 63%. Patient was discharged home and followed up with Dr. Florencio on January 26, 2020 October 2021 -Dec 2023 off Acalabrutinib   03/04/2021 started on acalabrutinib  100mg  BID    INTERVAL HISTORY Allen Chang is a 89 y.o. male who has above history reviewed by me presents for 4 months follow up for CLL and iron  deficiency anemia, MGUS Today patient was accompanied by his wife.  Patient reports feeling  well.   + Chronic fatigue. Patient reports increasing bilateral upper extremity bruising with mild swelling.  Patient is taking acalabrutinib . Daughter reports that patient's cognitive condition has recently declined and wonders if patient may get chemotherapy holiday.  Review of Systems  Constitutional:  Negative for appetite change, chills, fatigue, fever and unexpected weight change.  HENT:   Negative for hearing loss and voice change.   Eyes:  Negative for eye problems and icterus.  Respiratory:  Negative for chest tightness, cough and shortness of breath.   Cardiovascular:  Negative for chest pain and leg swelling.  Gastrointestinal:  Positive for constipation. Negative for abdominal distention and abdominal pain.  Endocrine: Negative for hot flashes.  Genitourinary:  Negative for difficulty urinating, dysuria and frequency.   Musculoskeletal:  Positive for arthralgias and gait problem. Negative for back pain.  Skin:  Negative for itching and rash.       Skin cancer  Neurological:  Positive for gait problem. Negative for light-headedness and numbness.  Hematological:  Negative for adenopathy. Bruises/bleeds easily.  Psychiatric/Behavioral:  Negative for confusion.     MEDICAL HISTORY:  Past Medical History:  Diagnosis Date   Cancer [facility])    prostate    CKD (chronic kidney disease) stage 3, GFR 30-59 ml/min (HCC) 10/01/2016   Complication of anesthesia    bradycardia, in ICU for 24 hour after galbladder surgery.    Diverticulosis 2012   Dysrhythmia    Heart skips a beat   Elevated lipids    GERD (gastroesophageal reflux disease)    History of hiatal hernia    Hypertension    Hypothyroidism    Leukemia (HCC)    Prostate cancer (HCC)    Skin cancer    face, scalp, behind ear,back and hand   Tremors of nervous system     SURGICAL HISTORY: Past Surgical History:  Procedure Laterality Date   BLADDER TUMOR EXCISION     CARDIAC CATHETERIZATION     CATARACT EXTRACTION  W/PHACO Right 01/16/2022   Procedure: CATARACT EXTRACTION PHACO AND INTRAOCULAR LENS PLACEMENT (IOC) RIGHT DIABETIC KAHOOK DUAL BLADE GONIOTOMY 14.34 01:38.4;  Surgeon: Mittie Gaskin, MD;  Location: [facility];  Service: Ophthalmology;  Laterality: Right;   CATARACT EXTRACTION W/PHACO Left 01/29/2022   Procedure: CATARACT EXTRACTION PHACO AND INTRAOCULAR LENS PLACEMENT (IOC) LEFT DIABETIC KAHOOK DUAL BLADE GONIOTOMY MALYUGIN  12.91  01:31.0;  Surgeon: Mittie Gaskin, MD;  Location: [facility];  Service: Ophthalmology;  Laterality: Left;   CHOLECYSTECTOMY N/A 10/10/2014   Procedure: LAPAROSCOPIC CHOLECYSTECTOMY WITH INTRAOPERATIVE CHOLANGIOGRAM;  Surgeon: Larinda Unknown Sharps, MD;  Location: [facility] ORS;  Service: General;  Laterality: N/A;   COLONOSCOPY WITH PROPOFOL  N/A 02/16/2017   Procedure: COLONOSCOPY WITH PROPOFOL ;  Surgeon: Viktoria Lamar DASEN, MD;  Location: [facility] ENDOSCOPY;  Service: Endoscopy;  Laterality: N/A;   ESOPHAGOGASTRODUODENOSCOPY (EGD) WITH PROPOFOL  N/A 02/16/2017   Procedure: ESOPHAGOGASTRODUODENOSCOPY (EGD) WITH PROPOFOL ;  Surgeon: Viktoria Lamar DASEN, MD;  Location: [facility] ENDOSCOPY;  Service: Endoscopy;  Laterality: N/A;   HEMORRHOID SURGERY     HERNIA REPAIR Left    x2   JOINT REPLACEMENT Left    Partial Knee Replacement, Dr. Edie   PARTIAL KNEE ARTHROPLASTY Left 12/12/2014   Procedure: UNICOMPARTMENTAL KNEE;  Surgeon: Norleen JINNY Edie, MD;  Location: [facility]  ORS;  Service: Orthopedics;  Laterality: Left;   PARTIAL KNEE ARTHROPLASTY Right 09/25/2020   Procedure: RIGHT PARTIAL KNEE REPLACEMENT;  Surgeon: Edie Norleen PARAS, MD;  Location: [facility] ORS;  Service: Orthopedics;  Laterality: Right;   prostate seeding     SHOULDER ARTHROSCOPY Right    SHOULDER ARTHROSCOPY WITH OPEN ROTATOR CUFF REPAIR Left 02/07/2016   Procedure: SHOULDER ARTHROSCOPY WITH OPEN ROTATOR CUFF REPAIR;  Surgeon: Norleen PARAS Edie, MD;  Location: [facility] ORS;  Service: Orthopedics;  Laterality: Left;     SOCIAL HISTORY: Social History   Socioeconomic History   Marital status: Married    Spouse name: Not on file   Number of children: Not on file   Years of education: Not on file   Highest education level: Not on file  Occupational History   Not on file  Tobacco Use   Smoking status: Never   Smokeless tobacco: Never  Vaping Use   Vaping status: Never Used  Substance and Sexual Activity   Alcohol use: No   Drug use: No   Sexual activity: Yes  Other Topics Concern   Not on file  Social History Narrative   Not on file   Social Drivers of Health   Financial Resource Strain: Low Risk  (04/30/2023)   Received from [facility]   Overall Financial Resource Strain (CARDIA)    Difficulty of Paying Living Expenses: Not hard at all  Food Insecurity: No Food Insecurity (04/30/2023)   Received from [facility]   Hunger Vital Sign    Within the past 12 months, you worried that your food would run out before you got the money to buy more.: Never true    Within the past 12 months, the food you bought just didn't last and you didn't have money to get more.: Never true  Transportation Needs: No Transportation Needs (04/30/2023)   Received from [place] - Transportation    In the past 12 months, has lack of transportation kept you from medical appointments or from getting medications?: No    Lack of Transportation (Non-Medical): No  Physical Activity: Not on file  Stress: Not on file  Social Connections: Not on file  Intimate Partner Violence: Not on file    FAMILY HISTORY: Family History  Problem Relation Age of Onset   Bone cancer Father    Hypertension Mother    Osteoporosis Mother    Breast cancer Sister    Cancer Brother    Cancer Brother    Lung cancer Brother    Bladder Cancer Brother    Melanoma Brother     ALLERGIES:  is allergic to oxycodone -acetaminophen  and voltaren [diclofenac  sodium].  MEDICATIONS:  Current Outpatient Medications  Medication Sig Dispense Refill   acetaminophen  (TYLENOL ) 500 MG tablet Take 1,000 mg by mouth every 8 (eight) hours as needed for moderate pain.     albuterol  (VENTOLIN  HFA) 108 (90 Base) MCG/ACT inhaler Inhale 2 puffs into the lungs every 6 (six) hours as needed for wheezing or shortness of breath. 8 g 0   apixaban (ELIQUIS) 5 MG TABS tablet Take 5 mg by mouth.     ascorbic acid  (VITAMIN C) 1000 MG tablet Take 1,000 mg by mouth daily.     b complex vitamins capsule Take 1 capsule by mouth daily.     calcium  carbonate (OSCAL) 1500 (600 Ca) MG TABS tablet Take 1,500 mg by mouth daily with breakfast.  Cholecalciferol  25 MCG (1000 UT) tablet Take 1,000 Units by mouth daily.     cyanocobalamin  100 MCG tablet Take 100 mcg by mouth daily.     donepezil  (ARICEPT ) 10 MG tablet Take 10 mg by mouth at bedtime.     DULoxetine  (CYMBALTA ) 20 MG capsule Take 20 mg by mouth daily.     famotidine  (PEPCID ) 40 MG tablet Take 40 mg by mouth 2 (two) times daily.     ferrous sulfate  325 (65 FE) MG EC tablet Take 325 mg by mouth 3 (three) times daily with meals.     Flaxseed, Linseed, (FLAX SEED OIL) 1000 MG CAPS Take 1,000 mg by mouth daily.     fluorouracil (EFUDEX) 5 % cream Apply to the back of your right hand twice per day for 5 to 7 days. STOP when the area becomes red and irritated even if you haven't done the full 7 days.     furosemide (LASIX) 20 MG tablet Take 20 mg by mouth daily.     gabapentin  (NEURONTIN ) 100 MG capsule Take 1 capsule by mouth at bedtime.     Garlic  1000 MG CAPS Take 1 capsule by mouth every morning.     Ginkgo Biloba (GINKOBA PO) Take 2 tablets by mouth daily.     latanoprost  (XALATAN ) 0.005 % ophthalmic solution Place 1 drop into both eyes at bedtime.     levothyroxine  (SYNTHROID ) 50 MCG tablet Take 50 mcg by mouth daily before breakfast. Take on an empty stomach with a glass of water  at least 30-60 minutes before breakfast.      loratadine  (CLARITIN  REDITABS) 10 MG dissolvable tablet Take 10 mg by mouth daily. In am     Misc Natural Products (BLACK CHERRY CONCENTRATE) LIQD Take 15 mLs by mouth daily.      MISC NATURAL PRODUCTS PO Take 2 capsules by mouth daily. Beet juice capsules     Omega-3 Fatty Acids (FISH OIL) 1000 MG CAPS Take 1 capsule by mouth daily.     OVER THE COUNTER MEDICATION Take 1 capsule by mouth daily. Neuriva     OVER THE COUNTER MEDICATION Take 1 capsule by mouth daily. Ageless Brain     OVER THE COUNTER MEDICATION Take 1,000 mg by mouth daily. BACOPA     pantoprazole  (PROTONIX ) 40 MG tablet Take 40 mg by mouth 2 (two) times daily.     propranolol  (INDERAL ) 10 MG tablet Take 10 mg by mouth 2 (two) times daily.     timolol (TIMOPTIC) 0.5 % ophthalmic solution 1 drop daily.     vitamin E  180 MG (400 UNITS) capsule Take 400 Units by mouth daily.     zinc  gluconate 50 MG tablet Take 50 mg by mouth daily.     No current facility-administered medications for this visit.      SABRA  PHYSICAL EXAMINATION: ECOG PERFORMANCE STATUS: 1 - Symptomatic but completely ambulatory Vitals:   09/09/23 1335 09/09/23 1336  BP: (!) 133/96 120/88  Pulse: (!) 112   Resp: 16   Temp: (!) 97 F (36.1 C)   SpO2: 98%    Filed Weights   09/09/23 1335  Weight: 161 lb (73 kg)    Physical Exam Constitutional:      General: He is not in acute distress.    Appearance: He is not diaphoretic.     Comments: Patient ambulates independently  HENT:     Head: Normocephalic and atraumatic.     Nose: Nose normal.  Mouth/Throat:     Pharynx: No oropharyngeal exudate.  Eyes:     General: No scleral icterus.    Pupils: Pupils are equal, round, and reactive to light.  Cardiovascular:     Rate and Rhythm: Normal rate.     Heart sounds: No murmur heard. Pulmonary:     Effort: Pulmonary effort is normal. No respiratory distress.     Breath sounds: No rales.  Chest:     Chest wall: No tenderness.  Abdominal:      General: There is no distension.     Palpations: Abdomen is soft.     Tenderness: There is no abdominal tenderness.  Musculoskeletal:        General: Normal range of motion.     Cervical back: Normal range of motion and neck supple.     Comments: Status post right knee replacement  Skin:    General: Skin is warm and dry.     Comments: Bilateral upper extremity bruising/purpura  Neurological:     Mental Status: He is alert and oriented to person, place, and time.  Psychiatric:        Mood and Affect: Affect normal.   .    LABORATORY DATA:  I have reviewed the data as listed    Latest Ref Rng & Units 09/09/2023    1:09 PM 08/25/2023    3:19 PM 08/06/2023    8:03 PM  CBC  WBC 4.0 - 10.5 K/uL 7.3  7.2  6.8   Hemoglobin 13.0 - 17.0 g/dL 86.3  86.2  86.6   Hematocrit 39.0 - 52.0 % 40.7  41.7  40.2   Platelets 150 - 400 K/uL 107  107  112       Latest Ref Rng & Units 09/09/2023    1:09 PM 08/25/2023    3:19 PM 08/06/2023    8:03 PM  CMP  Glucose 70 - 99 mg/dL 890  861  868   BUN 8 - 23 mg/dL 44  47  42   Creatinine 0.61 - 1.24 mg/dL 8.84  8.79  8.78   Sodium 135 - 145 mmol/L 136  140  139   Potassium 3.5 - 5.1 mmol/L 4.0  4.2  3.7   Chloride 98 - 111 mmol/L 105  105  103   CO2 22 - 32 mmol/L 26  25  25    Calcium  8.9 - 10.3 mg/dL 9.7  89.3  89.3   Total Protein 6.5 - 8.1 g/dL 7.5  7.7    Total Bilirubin 0.0 - 1.2 mg/dL 0.8  0.9    Alkaline Phos 38 - 126 U/L 59  54    AST 15 - 41 U/L 17  20    ALT 0 - 44 U/L 19  18      Chronic lymphocytic leukemia, B cell, CD38 positive (78%).  Cytogenetics revealed Trisomy 12 IGVH unmutated.  RADIOGRAPHIC STUDIES: I have personally reviewed the radiological images as listed and agreed with the findings in the report. no recent images. 11/04/2017  US  abdomen Stable right renal cyst. Status post cholecystectomy.Previously seen lesion within the liver is not well appreciated on this exam. No Splenomegaly.

## 2023-09-09 NOTE — Assessment & Plan Note (Addendum)
 CLL, intermediate risk with trisomy 23. IgVH unmutated. Stage III, extensive bone marrow involvement. Labs are reviewed and discussed with patient. Wbc is stable.  Given increasing bruising as well as decline of performance status due to cognitive decline, I recommend to stop acalabrutinib  100 mg twice daily and monitor his counts.  Consider repeat in the future if recurrence.

## 2023-09-09 NOTE — Assessment & Plan Note (Signed)
 avoid nephrotoxins. Encourage oral hydration

## 2023-09-09 NOTE — Assessment & Plan Note (Signed)
Labs are reviewed and discussed with patient. Stable M protein and normal light chain ration.  Continue observation. Repeat labs every 6 months.  

## 2023-09-09 NOTE — Assessment & Plan Note (Addendum)
 Likely secondary to marrow suppression from acalabrutinib .    Continue monitor

## 2023-09-09 NOTE — Progress Notes (Signed)
 Patients Left arm is dark black and blue colored and his elbow is swollen. Wife told me that he tells her that he runs into things all the time. I let them know that Eliquis is a bloo Patient just started on Eliquis from his cardiologist at the end of June 2025.

## 2023-09-28 ENCOUNTER — Other Ambulatory Visit: Payer: Self-pay

## 2023-09-28 NOTE — Progress Notes (Signed)
 Patient's Calquence  was discontinued on 7/23 by provider due to potential adverse effects. Patient will remain under observation instead. Alyson ok'd to disenroll.

## 2023-11-04 ENCOUNTER — Other Ambulatory Visit: Payer: Self-pay | Admitting: Internal Medicine

## 2023-11-04 DIAGNOSIS — Z9181 History of falling: Secondary | ICD-10-CM

## 2023-11-05 ENCOUNTER — Other Ambulatory Visit: Payer: Self-pay | Admitting: Physician Assistant

## 2023-11-05 ENCOUNTER — Ambulatory Visit
Admission: RE | Admit: 2023-11-05 | Discharge: 2023-11-05 | Disposition: A | Discharge status: Home / Self Care | Source: Ambulatory Visit | Attending: Physician Assistant | Admitting: Physician Assistant

## 2023-11-05 DIAGNOSIS — W19XXXA Unspecified fall, initial encounter: Secondary | ICD-10-CM | POA: Insufficient documentation

## 2023-11-05 DIAGNOSIS — W19XXXD Unspecified fall, subsequent encounter: Secondary | ICD-10-CM

## 2023-11-05 DIAGNOSIS — M47892 Other spondylosis, cervical region: Secondary | ICD-10-CM | POA: Insufficient documentation

## 2023-11-05 DIAGNOSIS — G4486 Cervicogenic headache: Secondary | ICD-10-CM

## 2023-11-05 DIAGNOSIS — S0990XS Unspecified injury of head, sequela: Secondary | ICD-10-CM

## 2023-11-05 DIAGNOSIS — M542 Cervicalgia: Secondary | ICD-10-CM

## 2023-11-05 DIAGNOSIS — I672 Cerebral atherosclerosis: Secondary | ICD-10-CM | POA: Insufficient documentation

## 2023-11-05 DIAGNOSIS — Y92009 Unspecified place in unspecified non-institutional (private) residence as the place of occurrence of the external cause: Secondary | ICD-10-CM | POA: Diagnosis not present

## 2023-11-05 DIAGNOSIS — S12401A Unspecified nondisplaced fracture of fifth cervical vertebra, initial encounter for closed fracture: Secondary | ICD-10-CM | POA: Diagnosis not present

## 2023-11-09 ENCOUNTER — Ambulatory Visit

## 2023-12-18 ENCOUNTER — Inpatient Hospital Stay: Admitting: Oncology

## 2023-12-18 ENCOUNTER — Other Ambulatory Visit: Payer: Self-pay

## 2023-12-18 ENCOUNTER — Emergency Department

## 2023-12-18 ENCOUNTER — Encounter: Payer: Self-pay | Admitting: Oncology

## 2023-12-18 ENCOUNTER — Inpatient Hospital Stay: Attending: Oncology

## 2023-12-18 ENCOUNTER — Emergency Department: Admission: EM | Admit: 2023-12-18 | Discharge: 2023-12-18 | Disposition: A | Discharge status: Home / Self Care

## 2023-12-18 VITALS — BP 104/72 | HR 58 | Temp 97.6°F | Resp 18 | Wt 155.4 lb

## 2023-12-18 DIAGNOSIS — I129 Hypertensive chronic kidney disease with stage 1 through stage 4 chronic kidney disease, or unspecified chronic kidney disease: Secondary | ICD-10-CM | POA: Insufficient documentation

## 2023-12-18 DIAGNOSIS — Z7901 Long term (current) use of anticoagulants: Secondary | ICD-10-CM | POA: Diagnosis not present

## 2023-12-18 DIAGNOSIS — C9110 Chronic lymphocytic leukemia of B-cell type not having achieved remission: Secondary | ICD-10-CM | POA: Diagnosis not present

## 2023-12-18 DIAGNOSIS — D509 Iron deficiency anemia, unspecified: Secondary | ICD-10-CM | POA: Insufficient documentation

## 2023-12-18 DIAGNOSIS — R319 Hematuria, unspecified: Secondary | ICD-10-CM

## 2023-12-18 DIAGNOSIS — Z85828 Personal history of other malignant neoplasm of skin: Secondary | ICD-10-CM | POA: Diagnosis not present

## 2023-12-18 DIAGNOSIS — Z79890 Hormone replacement therapy (postmenopausal): Secondary | ICD-10-CM | POA: Insufficient documentation

## 2023-12-18 DIAGNOSIS — Z8546 Personal history of malignant neoplasm of prostate: Secondary | ICD-10-CM | POA: Insufficient documentation

## 2023-12-18 DIAGNOSIS — D696 Thrombocytopenia, unspecified: Secondary | ICD-10-CM | POA: Insufficient documentation

## 2023-12-18 DIAGNOSIS — Z801 Family history of malignant neoplasm of trachea, bronchus and lung: Secondary | ICD-10-CM | POA: Diagnosis not present

## 2023-12-18 DIAGNOSIS — Z803 Family history of malignant neoplasm of breast: Secondary | ICD-10-CM | POA: Insufficient documentation

## 2023-12-18 DIAGNOSIS — I4891 Unspecified atrial fibrillation: Secondary | ICD-10-CM | POA: Insufficient documentation

## 2023-12-18 DIAGNOSIS — Z79899 Other long term (current) drug therapy: Secondary | ICD-10-CM | POA: Diagnosis not present

## 2023-12-18 DIAGNOSIS — N3001 Acute cystitis with hematuria: Secondary | ICD-10-CM | POA: Diagnosis not present

## 2023-12-18 DIAGNOSIS — Z808 Family history of malignant neoplasm of other organs or systems: Secondary | ICD-10-CM | POA: Insufficient documentation

## 2023-12-18 DIAGNOSIS — E119 Type 2 diabetes mellitus without complications: Secondary | ICD-10-CM | POA: Insufficient documentation

## 2023-12-18 DIAGNOSIS — N183 Chronic kidney disease, stage 3 unspecified: Secondary | ICD-10-CM | POA: Diagnosis not present

## 2023-12-18 DIAGNOSIS — E1122 Type 2 diabetes mellitus with diabetic chronic kidney disease: Secondary | ICD-10-CM | POA: Insufficient documentation

## 2023-12-18 DIAGNOSIS — Z8052 Family history of malignant neoplasm of bladder: Secondary | ICD-10-CM | POA: Diagnosis not present

## 2023-12-18 DIAGNOSIS — N189 Chronic kidney disease, unspecified: Secondary | ICD-10-CM | POA: Insufficient documentation

## 2023-12-18 DIAGNOSIS — D472 Monoclonal gammopathy: Secondary | ICD-10-CM

## 2023-12-18 DIAGNOSIS — R1030 Lower abdominal pain, unspecified: Secondary | ICD-10-CM | POA: Diagnosis present

## 2023-12-18 DIAGNOSIS — E039 Hypothyroidism, unspecified: Secondary | ICD-10-CM | POA: Insufficient documentation

## 2023-12-18 LAB — BASIC METABOLIC PANEL WITH GFR
Anion gap: 11 (ref 5–15)
BUN: 32 mg/dL — ABNORMAL HIGH (ref 8–23)
CO2: 30 mmol/L (ref 22–32)
Calcium: 9.8 mg/dL (ref 8.9–10.3)
Chloride: 97 mmol/L — ABNORMAL LOW (ref 98–111)
Creatinine, Ser: 1.25 mg/dL — ABNORMAL HIGH (ref 0.61–1.24)
GFR, Estimated: 55 mL/min — ABNORMAL LOW (ref >60)
Glucose, Bld: 116 mg/dL — ABNORMAL HIGH (ref 70–99)
Potassium: 3.9 mmol/L (ref 3.5–5.1)
Sodium: 138 mmol/L (ref 135–145)

## 2023-12-18 LAB — CMP (CANCER CENTER ONLY)
ALT: 21 U/L (ref 0–44)
AST: 19 U/L (ref 15–41)
Albumin: 3.6 g/dL (ref 3.5–5.0)
Alkaline Phosphatase: 71 U/L (ref 38–126)
Anion gap: 11 (ref 5–15)
BUN: 32 mg/dL — ABNORMAL HIGH (ref 8–23)
CO2: 29 mmol/L (ref 22–32)
Calcium: 9.8 mg/dL (ref 8.9–10.3)
Chloride: 95 mmol/L — ABNORMAL LOW (ref 98–111)
Creatinine: 1.28 mg/dL — ABNORMAL HIGH (ref 0.61–1.24)
GFR, Estimated: 53 mL/min — ABNORMAL LOW (ref >60)
Glucose, Bld: 127 mg/dL — ABNORMAL HIGH (ref 70–99)
Potassium: 4 mmol/L (ref 3.5–5.1)
Sodium: 135 mmol/L (ref 135–145)
Total Bilirubin: 1.2 mg/dL (ref 0.0–1.2)
Total Protein: 7.4 g/dL (ref 6.5–8.1)

## 2023-12-18 LAB — CBC WITH DIFFERENTIAL (CANCER CENTER ONLY)
Abs Immature Granulocytes: 0.06 K/uL (ref 0.00–0.07)
Basophils Absolute: 0.1 K/uL (ref 0.0–0.1)
Basophils Relative: 1 %
Eosinophils Absolute: 0.2 K/uL (ref 0.0–0.5)
Eosinophils Relative: 2 %
HCT: 41 % (ref 39.0–52.0)
Hemoglobin: 13.7 g/dL (ref 13.0–17.0)
Immature Granulocytes: 1 %
Lymphocytes Relative: 39 %
Lymphs Abs: 3.4 K/uL (ref 0.7–4.0)
MCH: 34.3 pg — ABNORMAL HIGH (ref 26.0–34.0)
MCHC: 33.4 g/dL (ref 30.0–36.0)
MCV: 102.5 fL — ABNORMAL HIGH (ref 80.0–100.0)
Monocytes Absolute: 1 K/uL (ref 0.1–1.0)
Monocytes Relative: 12 %
Neutro Abs: 4.1 K/uL (ref 1.7–7.7)
Neutrophils Relative %: 45 %
Platelet Count: 121 K/uL — ABNORMAL LOW (ref 150–400)
RBC: 4 MIL/uL — ABNORMAL LOW (ref 4.22–5.81)
RDW: 12.7 % (ref 11.5–15.5)
Smear Review: NORMAL
WBC Count: 8.8 K/uL (ref 4.0–10.5)
nRBC: 0 % (ref 0.0–0.2)

## 2023-12-18 LAB — CBC
HCT: 41.6 % (ref 39.0–52.0)
Hemoglobin: 13.7 g/dL (ref 13.0–17.0)
MCH: 34.3 pg — ABNORMAL HIGH (ref 26.0–34.0)
MCHC: 32.9 g/dL (ref 30.0–36.0)
MCV: 104 fL — ABNORMAL HIGH (ref 80.0–100.0)
Platelets: 120 K/uL — ABNORMAL LOW (ref 150–400)
RBC: 4 MIL/uL — ABNORMAL LOW (ref 4.22–5.81)
RDW: 12.8 % (ref 11.5–15.5)
WBC: 9.2 K/uL (ref 4.0–10.5)
nRBC: 0 % (ref 0.0–0.2)

## 2023-12-18 LAB — URINALYSIS, ROUTINE W REFLEX MICROSCOPIC
Bilirubin Urine: NEGATIVE
Glucose, UA: NEGATIVE mg/dL
Ketones, ur: NEGATIVE mg/dL
Nitrite: NEGATIVE
Protein, ur: NEGATIVE mg/dL
Specific Gravity, Urine: 1.035 — ABNORMAL HIGH (ref 1.005–1.030)
WBC, UA: >50 WBC/hpf (ref 0–5)
pH: 5 (ref 5.0–8.0)

## 2023-12-18 LAB — HEPATIC FUNCTION PANEL
ALT: 21 U/L (ref 0–44)
AST: 21 U/L (ref 15–41)
Albumin: 3.6 g/dL (ref 3.5–5.0)
Alkaline Phosphatase: 73 U/L (ref 38–126)
Bilirubin, Direct: 0.2 mg/dL (ref 0.0–0.2)
Indirect Bilirubin: 0.9 mg/dL (ref 0.3–0.9)
Total Bilirubin: 1.1 mg/dL (ref 0.0–1.2)
Total Protein: 7.6 g/dL (ref 6.5–8.1)

## 2023-12-18 LAB — LIPASE, BLOOD: Lipase: 21 U/L (ref 11–51)

## 2023-12-18 MED ORDER — CEPHALEXIN 500 MG PO CAPS: 500.0000 mg | ORAL_CAPSULE | Freq: Four times a day (QID) | ORAL | 0 refills | Status: AC

## 2023-12-18 MED ORDER — SODIUM CHLORIDE 0.9 % IV BOLUS
1000.0000 mL | Freq: Once | INTRAVENOUS | Status: AC
  Administered 2023-12-18: 1000 mL via INTRAVENOUS

## 2023-12-18 MED ORDER — SODIUM CHLORIDE 0.9 % IV SOLN
1.0000 g | Freq: Once | INTRAVENOUS | Status: AC
  Administered 2023-12-18: 1 g via INTRAVENOUS
  Filled 2023-12-18: qty 10

## 2023-12-18 MED ORDER — IOHEXOL 300 MG/ML  SOLN
100.0000 mL | Freq: Once | INTRAMUSCULAR | Status: AC | PRN
  Administered 2023-12-18: 100 mL via INTRAVENOUS

## 2023-12-18 NOTE — Progress Notes (Signed)
 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 Stage III, extensive bone marrow involvement. Labs are reviewed and discussed with patient. Wbc is stable.  He has been off Acalabrutinib  100mg  BID since July 2025 due to increased bruising and decline of PS.  Continue observation.    MGUS (monoclonal gammopathy of unknown significance) Labs are reviewed and discussed with patient. Stable M protein and normal light chain ration.  Continue observation. Repeat labs every 6 months.   Thrombocytopenia Likely secondary to marrow suppression from acalabrutinib .   Improved counts after stop of acalabrutinib   Hematuria Hematuria and flank pain. Possible kidney stone. He plans to go to ER for evaluation.   Orders Placed This Encounter  Procedures   CBC with Differential (Cancer Center Only)    Standing Status:   Future    Expected Date:   04/16/2024    Expiration Date:   07/15/2024   CMP (Cancer Center only)    Standing Status:   Future    Expected Date:   04/16/2024    Expiration Date:   07/15/2024   Multiple Myeloma Panel (SPEP&IFE w/QIG)    Standing Status:   Future    Expected Date:   03/19/2024    Expiration Date:   06/17/2024   Kappa/lambda light chains    Standing Status:   Future    Expected Date:   03/19/2024    Expiration Date:   06/17/2024   Follow up in  3 months All questions were answered. The patient knows to call the clinic with any problems, questions or concerns.  Zelphia Cap, MD, PhD [facility] Hematology Oncology 12/18/2023     HISTORY OF PRESENTING ILLNESS:  Allen Chang 89 y.o.  male with PMH listed as below who was referred by primary care provider to me for evaluation of leukocytosis. He has a history of prostate cancer which was diagnosed 10 years ago. He  underwent brachytherapy. He is not any castration treatment. Per patient, he follows up with Dr.Wolf and most recent PSA is normal.   Patient also reports feeling fatigue lately. Recent labs show leukocytosis, mild anemia, macrocytosis, mild thrombocytopenia, low ferritin. Denies blood in the stool. He was started on over the counter iron  supplement but wife who manages patient's medication decides to only let patient to take iron  medication every other day.   # received IV iron  Venofer  x 4. Colonoscopy was done on 02/16/2017 which showed polyps, internal hemorrhoids, negative for malignancy.  ##Patient had bone marrow biopsy on 09/22/2018 Pathology showed hypercellular bone marrow with extensive involvement by chronic lymphocytic leukemia. # Started on acalabrutinib  100 mg twice daily on 10/01/2018. 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  #August 2020-acalabrutinib  100 mg twice daily Stopped in October 2021 during his admission.  # admitted from 12/16/19- 10/30/ 2021 due to sudden onset chest pain. Troponins were negative in the emergency room.  Initial EKG showed atrial fibrillation with heart rate of 55. Patient was seen by cardiology and clarified that the EKG and telemetry actually showed sinus rhythm with PACs patient did not require any anticoagulation.Echocardiogram showed normal EF and no wall motion abnormality.  Nuclear stress testing showed low risk study, EF 63%. Patient was discharged home and followed up with Dr. Florencio on January 26, 2020 October 2021 -Dec 2023 off  Acalabrutinib   03/04/2021 started on acalabrutinib  100mg  BID    INTERVAL HISTORY Allen Chang is a 89 y.o. male who has above history reviewed by me presents for 4 months follow up for CLL and iron  deficiency anemia, MGUS Today patient was accompanied by his daughter Patient reports feeling well.   + Chronic fatigue. He has been experiencing right-sided back pain that radiates downwards,  accompanied by hematuria. The pain has been present for approximately five days, with the hematuria starting two days ago. +cognitive condition declined.  Review of Systems  Constitutional:  Negative for appetite change, chills, fatigue, fever and unexpected weight change.  HENT:   Negative for hearing loss and voice change.   Eyes:  Negative for eye problems and icterus.  Respiratory:  Negative for chest tightness, cough and shortness of breath.   Cardiovascular:  Negative for chest pain and leg swelling.  Gastrointestinal:  Positive for constipation. Negative for abdominal distention and abdominal pain.  Endocrine: Negative for hot flashes.  Genitourinary:  Positive for hematuria. Negative for difficulty urinating, dysuria and frequency.   Musculoskeletal:  Positive for arthralgias and gait problem. Negative for back pain.  Skin:  Negative for itching and rash.       Skin cancer  Neurological:  Positive for gait problem. Negative for light-headedness and numbness.  Hematological:  Negative for adenopathy. Bruises/bleeds easily.  Psychiatric/Behavioral:  Negative for confusion.     MEDICAL HISTORY:  Past Medical History:  Diagnosis Date   Cancer [facility])    prostate    CKD (chronic kidney disease) stage 3, GFR 30-59 ml/min (HCC) 10/01/2016   Complication of anesthesia    bradycardia, in ICU for 24 hour after galbladder surgery.    Diverticulosis 2012   Dysrhythmia    Heart skips a beat   Elevated lipids    GERD (gastroesophageal reflux disease)    History of hiatal hernia    Hypertension    Hypothyroidism    Leukemia (HCC)    Prostate cancer (HCC)    Skin cancer    face, scalp, behind ear,back and hand   Tremors of nervous system     SURGICAL HISTORY: Past Surgical History:  Procedure Laterality Date   BLADDER TUMOR EXCISION     CARDIAC CATHETERIZATION     CATARACT EXTRACTION W/PHACO Right 01/16/2022   Procedure: CATARACT EXTRACTION PHACO AND INTRAOCULAR LENS PLACEMENT  (IOC) RIGHT DIABETIC KAHOOK DUAL BLADE GONIOTOMY 14.34 01:38.4;  Surgeon: Mittie Gaskin, MD;  Location: [facility];  Service: Ophthalmology;  Laterality: Right;   CATARACT EXTRACTION W/PHACO Left 01/29/2022   Procedure: CATARACT EXTRACTION PHACO AND INTRAOCULAR LENS PLACEMENT (IOC) LEFT DIABETIC KAHOOK DUAL BLADE GONIOTOMY MALYUGIN  12.91  01:31.0;  Surgeon: Mittie Gaskin, MD;  Location: [facility];  Service: Ophthalmology;  Laterality: Left;   CHOLECYSTECTOMY N/A 10/10/2014   Procedure: LAPAROSCOPIC CHOLECYSTECTOMY WITH INTRAOPERATIVE CHOLANGIOGRAM;  Surgeon: Larinda Unknown Sharps, MD;  Location: [facility] ORS;  Service: General;  Laterality: N/A;   COLONOSCOPY WITH PROPOFOL  N/A 02/16/2017   Procedure: COLONOSCOPY WITH PROPOFOL ;  Surgeon: Viktoria Lamar DASEN, MD;  Location: [facility];  Service: Endoscopy;  Laterality: N/A;   ESOPHAGOGASTRODUODENOSCOPY (EGD) WITH PROPOFOL  N/A 02/16/2017   Procedure: ESOPHAGOGASTRODUODENOSCOPY (EGD) WITH PROPOFOL ;  Surgeon: Viktoria Lamar DASEN, MD;  Location: [facility];  Service: Endoscopy;  Laterality: N/A;   HEMORRHOID SURGERY     HERNIA REPAIR Left    x2   JOINT REPLACEMENT Left    Partial Knee Replacement, Dr. Edie   PARTIAL KNEE ARTHROPLASTY  Left 12/12/2014   Procedure: UNICOMPARTMENTAL KNEE;  Surgeon: Norleen JINNY Maltos, MD;  Location: [facility] OR[facility];  Service: Orthopedics;  Laterality: Left;   PARTIAL KNEE ARTHROPLASTY Right 09/25/2020   Procedure: RIGHT PARTIAL KNEE REPLACEMENT;  Surgeon: Maltos Norleen JINNY, MD;  Location: [facility] OR[facility];  Service: Orthopedics;  Laterality: Right;   prostate seeding     SHOULDER ARTHROSCOPY Right    SHOULDER ARTHROSCOPY WITH OPEN ROTATOR CUFF REPAIR Left 02/07/2016   Procedure: SHOULDER ARTHROSCOPY WITH OPEN ROTATOR CUFF REPAIR;  Surgeon: Norleen JINNY Maltos, MD;  Location: [facility];  Service: Orthopedics;  Laterality: Left;    SOCIAL HISTORY: Social History   Socioeconomic History   Marital status: Married     Spouse name: Not on file   Number of children: Not on file   Years of education: Not on file   Highest education level: Not on file  Occupational History   Not on file  Tobacco Use   Smoking status: Never   Smokeless tobacco: Never  Vaping Use   Vaping status: Never Used  Substance and Sexual Activity   Alcohol use: No   Drug use: No   Sexual activity: Yes  Other Topics Concern   Not on file  Social History Narrative   Not on file   Social Drivers of Health   Financial Resource Strain: Low Risk  (09/14/2023)   Received from [facility]   Overall Financial Resource Strain (CARDIA)    Difficulty of Paying Living Expenses: Not hard at all  Food Insecurity: No Food Insecurity (09/14/2023)   Received from [facility]   Hunger Vital Sign    Within the past 12 months, you worried that your food would run out before you got the money to buy more.: Never true    Within the past 12 months, the food you bought just didn't last and you didn't have money to get more.: Never true  Transportation Needs: No Transportation Needs (09/14/2023)   Received from [facility] - Transportation    In the past 12 months, has lack of transportation kept you from medical appointments or from getting medications?: No    Lack of Transportation (Non-Medical): No  Physical Activity: Not on file  Stress: Not on file  Social Connections: Not on file  Intimate Partner Violence: Not on file    FAMILY HISTORY: Family History  Problem Relation Age of Onset   Bone cancer Father    Hypertension Mother    Osteoporosis Mother    Breast cancer Sister    Cancer Brother    Cancer Brother    Lung cancer Brother    Bladder Cancer Brother    Melanoma Brother     ALLERGIES:  is allergic to oxycodone -acetaminophen  and voltaren [diclofenac sodium].  MEDICATIONS:  Current Outpatient Medications  Medication Sig Dispense Refill   acetaminophen   (TYLENOL ) 500 MG tablet Take 1,000 mg by mouth every 8 (eight) hours as needed for moderate pain.     apixaban (ELIQUIS) 5 MG TABS tablet Take 5 mg by mouth.     ascorbic acid  (VITAMIN C) 1000 MG tablet Take 1,000 mg by mouth daily.     b complex vitamins capsule Take 1 capsule by mouth daily.     calcium  carbonate (OSCAL) 1500 (600 Ca) MG TABS tablet Take 1,500 mg by mouth daily with breakfast.     Cholecalciferol  25 MCG (1000 UT) tablet Take 1,000 Units by mouth daily.  donepezil  (ARICEPT ) 10 MG tablet Take 10 mg by mouth at bedtime.     DULoxetine  (CYMBALTA ) 20 MG capsule Take 20 mg by mouth daily.     ferrous sulfate  325 (65 FE) MG EC tablet Take 325 mg by mouth 3 (three) times daily with meals.     Flaxseed, Linseed, (FLAX SEED OIL) 1000 MG CAPS Take 1,000 mg by mouth daily.     furosemide (LASIX) 20 MG tablet Take 20 mg by mouth daily.     gabapentin  (NEURONTIN ) 100 MG capsule Take 1 capsule by mouth at bedtime.     Garlic  1000 MG CAPS Take 1 capsule by mouth every morning.     Ginkgo Biloba (GINKOBA PO) Take 2 tablets by mouth daily.     latanoprost  (XALATAN ) 0.005 % ophthalmic solution Place 1 drop into both eyes at bedtime.     levothyroxine  (SYNTHROID ) 50 MCG tablet Take 50 mcg by mouth daily before breakfast. Take on an empty stomach with a glass of water  at least 30-60 minutes before breakfast.     loratadine  (CLARITIN  REDITABS) 10 MG dissolvable tablet Take 10 mg by mouth daily. In am     Misc Natural Products (BLACK CHERRY CONCENTRATE) LIQD Take 15 mLs by mouth daily.      MISC NATURAL PRODUCTS PO Take 2 capsules by mouth daily. Beet juice capsules     Omega-3 Fatty Acids (FISH OIL) 1000 MG CAPS Take 1 capsule by mouth daily.     OVER THE COUNTER MEDICATION Take 1 capsule by mouth daily. Neuriva     OVER THE COUNTER MEDICATION Take 1 capsule by mouth daily. Ageless Brain     OVER THE COUNTER MEDICATION Take 1,000 mg by mouth daily. BACOPA     pantoprazole  (PROTONIX ) 40 MG  tablet Take 40 mg by mouth 2 (two) times daily.     propranolol  (INDERAL ) 10 MG tablet Take 10 mg by mouth 2 (two) times daily.     timolol (TIMOPTIC) 0.5 % ophthalmic solution 1 drop daily.     vitamin E  180 MG (400 UNITS) capsule Take 400 Units by mouth daily.     zinc  gluconate 50 MG tablet Take 50 mg by mouth daily.     albuterol  (VENTOLIN  HFA) 108 (90 Base) MCG/ACT inhaler Inhale 2 puffs into the lungs every 6 (six) hours as needed for wheezing or shortness of breath. (Patient not taking: Reported on 12/18/2023) 8 g 0   cephALEXin (KEFLEX) 500 MG capsule Take 1 capsule (500 mg total) by mouth 4 (four) times daily for 7 days. 28 capsule 0   cyanocobalamin  100 MCG tablet Take 100 mcg by mouth daily. (Patient not taking: Reported on 12/18/2023)     famotidine  (PEPCID ) 40 MG tablet Take 40 mg by mouth 2 (two) times daily. (Patient not taking: Reported on 12/18/2023)     fluorouracil (EFUDEX) 5 % cream Apply to the back of your right hand twice per day for 5 to 7 days. STOP when the area becomes red and irritated even if you haven't done the full 7 days. (Patient not taking: Reported on 12/18/2023)     No current facility-administered medications for this visit.      SABRA  PHYSICAL EXAMINATION: ECOG PERFORMANCE STATUS: 1 - Symptomatic but completely ambulatory Vitals:   12/18/23 1215  BP: 104/72  Pulse: (!) 58  Resp: 18  Temp: 97.6 F (36.4 C)  SpO2: 95%   Filed Weights   12/18/23 1215  Weight: 155 lb 6.4 oz (  70.5 kg)    Physical Exam Constitutional:      General: He is not in acute distress.    Appearance: He is not diaphoretic.     Comments: Patient ambulates independently  HENT:     Head: Normocephalic and atraumatic.  Eyes:     General: No scleral icterus. Cardiovascular:     Rate and Rhythm: Normal rate.     Heart sounds: No murmur heard. Pulmonary:     Effort: Pulmonary effort is normal. No respiratory distress.     Breath sounds: Normal breath sounds.  Chest:      Chest wall: No tenderness.  Abdominal:     General: There is no distension.     Palpations: Abdomen is soft.     Tenderness: There is no abdominal tenderness.  Musculoskeletal:        General: Normal range of motion.     Cervical back: Normal range of motion and neck supple.     Comments: Status post right knee replacement  Skin:    General: Skin is warm and dry.     Comments: Bilateral upper extremity bruising/purpura  Neurological:     Mental Status: He is alert. Mental status is at baseline.  Psychiatric:        Mood and Affect: Mood and affect normal.   .    LABORATORY DATA:  I have reviewed the data as listed    Latest Ref Rng & Units 12/18/2023    1:21 PM 12/18/2023   12:00 PM 09/09/2023    1:09 PM  CBC  WBC 4.0 - 10.5 K/uL 9.2  8.8  7.3   Hemoglobin 13.0 - 17.0 g/dL 86.2  86.2  86.3   Hematocrit 39.0 - 52.0 % 41.6  41.0  40.7   Platelets 150 - 400 K/uL 120  121  107       Latest Ref Rng & Units 12/18/2023    1:21 PM 12/18/2023   12:00 PM 09/09/2023    1:09 PM  CMP  Glucose 70 - 99 mg/dL 883  872  890   BUN 8 - 23 mg/dL 32  32  44   Creatinine 0.61 - 1.24 mg/dL 8.74  8.71  8.84   Sodium 135 - 145 mmol/L 138  135  136   Potassium 3.5 - 5.1 mmol/L 3.9  4.0  4.0   Chloride 98 - 111 mmol/L 97  95  105   CO2 22 - 32 mmol/L 30  29  26    Calcium  8.9 - 10.3 mg/dL 9.8  9.8  9.7   Total Protein 6.5 - 8.1 g/dL 7.6  7.4  7.5   Total Bilirubin 0.0 - 1.2 mg/dL 1.1  1.2  0.8   Alkaline Phos 38 - 126 U/L 73  71  59   AST 15 - 41 U/L 21  19  17    ALT 0 - 44 U/L 21  21  19      Chronic lymphocytic leukemia, B cell, CD38 positive (78%).  Cytogenetics revealed Trisomy 12 IGVH unmutated.  RADIOGRAPHIC STUDIES: I have personally reviewed the radiological images as listed and agreed with the findings in the report. no recent images. 11/04/2017  US  abdomen Stable right renal cyst. Status post cholecystectomy.Previously seen lesion within the liver is not well appreciated  on this exam. No Splenomegaly.

## 2023-12-18 NOTE — Assessment & Plan Note (Signed)
 Likely secondary to marrow suppression from acalabrutinib .   Improved counts after stop of acalabrutinib 

## 2023-12-18 NOTE — Assessment & Plan Note (Signed)
 Hematuria and flank pain. Possible kidney stone. He plans to go to ER for evaluation.

## 2023-12-18 NOTE — ED Triage Notes (Signed)
 Patient reports blood in urine and pelvic pain for 2-3 days.

## 2023-12-18 NOTE — Assessment & Plan Note (Addendum)
 CLL, intermediate risk with trisomy 65. IgVH unmutated. Stage III, extensive bone marrow involvement. Labs are reviewed and discussed with patient. Wbc is stable.  He has been off Acalabrutinib  100mg  BID since July 2025 due to increased bruising and decline of PS.  Continue observation.

## 2023-12-18 NOTE — ED Provider Notes (Signed)
 [facility] Provider Note    Event Date/Time   First MD Initiated Contact with Patient 12/18/23 1640     (approximate)   History   Hematuria   HPI  Allen Chang is a 89 y.o. male  89 y.o.male patient who presents for a 3 month follow up. PMH significant for a-fib, CLL, lymphocytosis, GERD, edema, CKD, bradycardia, hypertension, type 2 diabetes, PVD he presents pleasant and show and for 2 to 3 days and pain at the level of his penis and suprapubic area.  Patient denies any chest pain or shortness of breath.  Denies any laterality of the pain.  He has no prior history of kidney stones.  He was seen by his hematologist/oncologist today reportedly his CLL blood work has continued to improve and he is not on any therapy.  He presents with his wife and daughter who help contribute to the history      Physical Exam   Triage Vital Signs: ED Triage Vitals  Encounter Vitals Group     BP 12/18/23 1319 (!) 98/57     Girls Systolic BP Percentile --      Girls Diastolic BP Percentile --      Boys Systolic BP Percentile --      Boys Diastolic BP Percentile --      Pulse Rate 12/18/23 1319 100     Resp 12/18/23 1319 18     Temp 12/18/23 1319 97.8 F (36.6 C)     Temp Source 12/18/23 1319 Oral     SpO2 12/18/23 1319 97 %     Weight --      Height --      Head Circumference --      Peak Flow --      Pain Score 12/18/23 1317 10     Pain Loc --      Pain Education --      Exclude from Growth Chart --     Most recent vital signs: Vitals:   12/18/23 1708 12/18/23 2015  BP:  139/81  Pulse:  (!) 57  Resp:  19  Temp: 98.5 F (36.9 C) 97.7 F (36.5 C)  SpO2:  91%    Nursing Triage Note reviewed. Vital signs reviewed and patients oxygen saturation is normoxic  General: Patient is well nourished, well developed, awake and alert, resting comfortably in no acute distress Head: Normocephalic and atraumatic Eyes: Normal inspection, extraocular muscles  intact, no conjunctival pallor Ear, nose, throat: Normal external exam Neck: Normal range of motion Respiratory: Patient is in no respiratory distress, lungs CTAB Cardiovascular: Patient is not tachycardic, RRR without murmur appreciated GI: Abd soft, mild tenderness to palpation in the suprapubic or back area, no CVA tenderness to palpation GU: No penile or scrotal erythema edema or gross abnormality Back: Normal inspection of the back with good strength and range of motion throughout all ext Extremities: pulses intact with good cap refills, no LE pitting edema or calf tenderness Neuro: The patient is alert and oriented to person, place, and time, appropriately conversive, with 5/5 bilat UE/LE strength, no gross motor or sensory defects noted. Coordination appears to be adequate. Skin: Warm, dry, and intact Psych: normal mood and affect, no SI or HI  ED Results / Procedures / Treatments   Labs (all labs ordered are listed, but only abnormal results are displayed) Labs Reviewed  URINALYSIS, ROUTINE W REFLEX MICROSCOPIC - Abnormal; Notable for the following components:      Result Value  Color, Urine YELLOW (*)    APPearance HAZY (*)    Specific Gravity, Urine 1.035 (*)    Hgb urine dipstick SMALL (*)    Leukocytes,Ua MODERATE (*)    Bacteria, UA FEW (*)    All other components within normal limits  BASIC METABOLIC PANEL WITH GFR - Abnormal; Notable for the following components:   Chloride 97 (*)    Glucose, Bld 116 (*)    BUN 32 (*)    Creatinine, Ser 1.25 (*)    GFR, Estimated 55 (*)    All other components within normal limits  CBC - Abnormal; Notable for the following components:   RBC 4.00 (*)    MCV 104.0 (*)    MCH 34.3 (*)    Platelets 120 (*)    All other components within normal limits  URINE CULTURE  HEPATIC FUNCTION PANEL  LIPASE, BLOOD     EKG EKG and rhythm strip are interpreted by myself:   EKG: afib at heart rate of 51, normal QRS duration, QTc 394,  nonspecific ST segments and T waves no ectopy EKG not consistent with Acute STEMI Rhythm strip: afib in lead II Not significantly changed from baseline.    RADIOLOGY CT abd and pelvis with iv contrast: No acute abnormality on my independent review interpretation radiologist agrees.  I did review the incidental findings with the patient, patient's daughter and wife in the room and gave them a copy of the report    PROCEDURES:  Critical Care performed: No  Procedures   MEDICATIONS ORDERED IN ED: Medications  sodium chloride  0.9 % bolus 1,000 mL (0 mLs Intravenous Stopped 12/18/23 2019)  iohexol  (OMNIPAQUE ) 300 MG/ML solution 100 mL (100 mLs Intravenous Contrast Given 12/18/23 1741)  cefTRIAXone  (ROCEPHIN ) 1 g in sodium chloride  0.9 % 100 mL IVPB (0 g Intravenous Stopped 12/18/23 2019)     IMPRESSION / MDM / ASSESSMENT AND PLAN / ED COURSE                                Differential diagnosis includes, but is not limited to, urinary tract infection, pyelonephritis, sepsis, nephrolithiasis, renal artery dissection, malignancy   ED course: Patient presents and abdomen demonstrates no evidence of peritonitis.  He did not have a leukocytosis or an acute renal insufficiency (creatinine at baseline for him.  He was not acutely anemic.  A CT abdomen pelvis was ordered given patient's risk factors which demonstrated no acute abnormality and incidental findings were reviewed with family.  Urinalysis was consistent with a UTI.  I did send the urine for culture and gave the patient a dose of ceftriaxone .  I will send the patient home with 7 days of 500 mg Keflex 4 times daily.  He knows to test within 2 weeks to ensure resolve and to ensure no malignancy.  All questions answered and patient voiced understanding and requested discharge   Clinical Course as of 12/19/23 0003  Fri Dec 18, 2023  1904 CBC(!) Not anemic no leukocytosis [HD]  1905 Basic metabolic panel(!) No profound electrolyte  derangements.  Creatinine is only very borderline elevated which she is getting IV fluid [HD]  1905 Hepatic function panel No elevated liver function tests [HD]  1905 Lipase: 21 Not elevated [HD]  1905 CT ABDOMEN PELVIS W CONTRAST No acute abnormality [HD]  1905 EKG 12-Lead Consistent with atrial fibrillation [HD]  1940 Urinalysis, Routine w reflex microscopic -Urine, Clean CatchROLLEN)  Consistent with UTI [HD]  1941 On 04/15/2024, patient's urine culture was susceptible to everything except for Macrobid [HD]    Clinical Course User Index [HD] Nicholaus Rolland BRAVO, MD   At time of discharge there is no evidence of acute life, limb, vision, or fertility threat. Patient has stable vital signs, pain is well controlled, patient is ambulatory and p.o. tolerant.  Discharge instructions were completed using the EPIC system. I would refer you to those at this time. All warnings prescriptions follow-up etc. were discussed in detail with the patient. Patient indicates understanding and is agreeable with this plan. All questions answered.  Patient is made aware that they may return to the emergency department for any worsening or new condition or for any other emergency.  -- Risk: 5 This patient has a high risk of morbidity due to further diagnostic testing or treatment. Rationale: This patient's evaluation and management involve a high risk of morbidity due to the potential severity of presenting symptoms, need for diagnostic testing, and/or initiation of treatment that may require close monitoring. The differential includes conditions with potential for significant deterioration or requiring escalation of care. Treatment decisions in the ED, including medication administration, procedural interventions, or disposition planning, reflect this level of risk. COPA: 5 The patient has the following acute or chronic illness/injury that poses a possible threat to life or bodily function: [X] : The patient has a  potentially serious acute condition or an acute exacerbation of a chronic illness requiring urgent evaluation and management in the Emergency Department. The clinical presentation necessitates immediate consideration of life-threatening or function-threatening diagnoses, even if they are ultimately ruled out.   FINAL CLINICAL IMPRESSION(S) / ED DIAGNOSES   Final diagnoses:  Acute cystitis with hematuria     Rx / DC Orders   ED Discharge Orders          Ordered    cephALEXin (KEFLEX) 500 MG capsule  4 times daily        12/18/23 1956             Note:  This document was prepared using Dragon voice recognition software and may include unintentional dictation errors.   Nicholaus Rolland BRAVO, MD 12/19/23 [phone]

## 2023-12-18 NOTE — Discharge Instructions (Signed)
 You was seen in the emergency department for pain with urination and blood in the urine.  Workup today was reassuring and you are safe to return home.  Please take the full course of antibiotics of the next due tomorrow morning.  Please follow-up with your primary care physician and have a repeat urinalysis completed in 2 weeks time to ensure that your UTI has resolved because on rare occasions, recurrent urinary tract infections can be secondary to bladder malignancies.  Return with any acutely worsening symptoms or any other emergency. -- RETURN PRECAUTIONS & AFTERCARE: (ENGLISH) RETURN PRECAUTIONS: Return immediately to the emergency department or see/call your doctor if you feel worse, weak or have changes in speech or vision, are short of breath, have fever, vomiting, pain, bleeding or dark stool, trouble urinating or any new issues. Return here or see/call your doctor if not improving as expected for your suspected condition. FOLLOW-UP CARE: Call your doctor and/or any doctors we referred you to for more advice and to make an appointment. Do this today, tomorrow or after the weekend. Some doctors only take PPO insurance so if you have HMO insurance you may want to contact your HMO or your regular doctor for referral to a specialist within your plan. Either way tell the doctor's office that it was a referral from the emergency department so you get the soonest possible appointment.  YOUR TEST RESULTS: Take result reports of any blood or urine tests, imaging tests and EKG's to your doctor and any referral doctor. Have any abnormal tests repeated. Your doctor or a referral doctor can let you know when this should be done. Also make sure your doctor contacts this hospital to get any test results that are not currently available such as cultures or special tests for infection and final imaging reports, which are often not available at the time you leave the ER but which may list additional important findings  that are not documented on the preliminary report. BLOOD PRESSURE: If your blood pressure was greater than 120/80 have your blood pressure rechecked within 1 to 2 weeks. MEDICATION SIDE EFFECTS: Do not drive, walk, bike, take the bus, etc. if you have received or are being prescribed any sedating medications such as those for pain or anxiety or certain antihistamines like Benadryl . If you have been give one of these here get a taxi home or have a friend drive you home. Ask your pharmacist to counsel you on potential side effects of any new medication

## 2023-12-18 NOTE — ED Notes (Signed)
 Pt reported right sided/flank pain while EDP  Nicholaus was doing their physical exam.

## 2023-12-18 NOTE — Assessment & Plan Note (Signed)
Labs are reviewed and discussed with patient. Stable M protein and normal light chain ration.  Continue observation. Repeat labs every 6 months.  

## 2023-12-20 LAB — URINE CULTURE: Culture: >=100000 — AB

## 2024-02-22 NOTE — Progress Notes (Signed)
 Chief Complaint  Patient presents with   Follow-up    HPI  Allen Chang is a 89 y.o. here for a Follow up Hx Moh's surgery at [facility] (Dr. Gregorio) on skin lesions on Rt hand  Has been feeling well  Continues to have memory loss   Hx of Rt knee replacement surgery on 09/25/20  Memory has been gradually deteriorating - On Neuriva  Denies chest pains, shortness of breath  Underwent left shoulder surgery in Dec 2017  Appetite is good. Non Smoker. No alcohol. Recent labs : Hgb :13.9 Sugar;118 , BUN; 39 Se Creat : 1.1 EGFR:; 64), A1c; 6.3 TSH ;3.761 Total Cholesterol: 146,Triglycerides; 105   PSA: <0.01 Rest of 10 point review of systems is normal     Outpatient Encounter Medications as of 02/22/2024  Medication Sig Dispense Refill   acalabrutinib  maleate (CALQUENCE ) 100 mg tablet Take 200 mg by mouth 2 (two) times daily     acetaminophen  (TYLENOL ) 500 MG tablet Take 1,000 mg by mouth 2 (two) times daily as needed for Pain     apixaban  (ELIQUIS ) 5 mg tablet Take 1 tablet (5 mg total) by mouth every 12 (twelve) hours 180 tablet 3   apixaban  (ELIQUIS ) 5 mg tablet Take 1 tablet (5 mg total) by mouth every 12 (twelve) hours 60 tablet 0   ascorbic acid , vitamin C, (VITAMIN C) 1000 MG tablet Take 1,000 mg by mouth once daily     calcium  carbonate 600 mg calcium  (1,500 mg) Tab tablet Take 1 tablet by mouth daily with breakfast     cephalexin  (KEFLEX ) 500 MG capsule Take 500 mg by mouth 4 (four) times daily     cholecalciferol  (VITAMIN D3) 1000 unit tablet Take 1,000 Units by mouth once daily     donepeziL  (ARICEPT ) 10 MG tablet Take 1 tablet (10 mg total) by mouth at bedtime 90 tablet 3   DULoxetine  (CYMBALTA ) 20 MG DR capsule TAKE 1 CAPSULE BY MOUTH ONCE  DAILY 90 capsule 1   ferrous sulfate  325 (65 FE) MG EC tablet Take 1 tablet (325 mg total) by mouth 3 (three) times daily with meals 270 tablet 3   flaxseed oil Oil Take 1 capsule by mouth once daily     FUROsemide (LASIX) 20  MG tablet TAKE 1 TABLET BY MOUTH ONCE  DAILY 90 tablet 3   gabapentin  (NEURONTIN ) 100 MG capsule Take 1 capsule (100 mg total) by mouth at bedtime 90 capsule 3   GARLIC  ORAL Take by mouth     ginkgo biloba 40 mg Tab Take 2 tablets by mouth once daily     Herbal Supplement Take 2 tablets by mouth once daily Herbal Name: BEET ROOT     latanoprost  (XALATAN ) 0.005 % ophthalmic solution Place 1 drop into both eyes nightly        levothyroxine  (SYNTHROID ) 50 MCG tablet TAKE 1 TABLET BY MOUTH MONDAY  THROUGH FRIDAY ON AN EMPTY  STOMACH WITH A GLASS OF WATER  AT LEAST 30 TO 60 MINUTES BEFORE  BREAKFAST 65 tablet 1   levothyroxine  (SYNTHROID ) 75 MCG tablet TAKE 1 TABLET BY MOUTH EVERY  SATURDAY AND SUNDAY ON AN EMPTY  STOMACH WITH A GLASS OF WATER  AT LEAST 30 TO 60 MINUTES BEFORE  BREAKFAST 26 tablet 1   loratadine  (CLARITIN ) 10 mg capsule Take 10 mg by mouth once daily.     multivitamin with minerals tablet Take 1 tablet by mouth once daily     OMEGA-3-DHA-EPA-DPA ORAL Take by mouth  pantoprazole  (PROTONIX ) 40 MG DR tablet TAKE 1 TABLET BY MOUTH ONCE  DAILY 90 tablet 1   propranoloL  (INDERAL ) 10 MG tablet Take 1 tablet (10 mg total) by mouth 2 (two) times daily 180 tablet 3   triamcinolone 0.1 % cream      UNABLE TO FIND Med Name: black cherry concentrate liquid 2 tbsp     vit B complex 100 no.2/herbs (VITAMIN B COMPLEX 100  2-HERBS ORAL) Take 1 capsule by mouth once daily     vit C/vit E ac/selenium/ginkgo (MEMORY COMPLEX ORAL) Take by mouth     vitamin E  400 UNIT capsule Take 400 Units by mouth once daily     ZINC  ORAL Take by mouth     No facility-administered encounter medications on file as of 02/22/2024.    Allergies as of 02/22/2024 - Reviewed 11/04/2023  Allergen Reaction Noted   Oxycodone -acetaminophen  Hallucination 10/03/2014   Voltaren [diclofenac sodium] Palpitations 05/02/2013    Past Medical History:  Diagnosis Date   Actinic keratosis    Allergic state     Anemia    Asthma without status asthmaticus (HHS-HCC)    Cancer (CMS/HHS-HCC)    prostate   Chronic kidney disease    stage 3   GERD (gastroesophageal reflux disease)    Hiatal hernia    History of colon polyps 11/13/2016   Hyperlipidemia    Hypertension    IDA (iron  deficiency anemia) 11/13/2016   Occasional tremors    Osteoarthritis    bilateral   Tremor     Past Surgical History:  Procedure Laterality Date   HEMORRHOIDECTOMY BY SIMPLE LIGATION  01/2010   COLONOSCOPY  06/27/2011   Adenomatous Polyp   COLONOSCOPY  08/10/2013   PH Adenomatous Polyp   CHOLECYSTECTOMY  10/10/2014   Dr Unknown Sharps   left unicondylar knee arthroplasty  Left 12/12/2014   Dr.Poggi    extensive arthroscopic debridement arthroscopic subacromail decompression mini-open rotator cuff repair, and mini-open biceps tenodesis left shoulder  Left 02/07/2016   Dr.Poggi    COLONOSCOPY  02/16/2017   Adenomatous Polyps: CBF 01/2020   EGD  02/16/2017   No repeat per RTE   Right unicondylar knee arthroplasty Right 09/25/2020   Dr.Poggi   Cancer Removal  12/11/2021   Right Hand- [place]   BASAL CELL CARCINOMA EXCISION     HERNIA REPAIR     x2   JOINT REPLACEMENT     PROSTATE SURGERY     seed implants   shoulder surgery Right     Vitals:   02/22/24 1102  BP: 118/74  Pulse: 104      Exam Blood pressure 118/74, pulse 104, height 175.3 cm (5' 9), weight 76.7 kg (169 lb 3.2 oz), SpO2 93%.  Wt Readings from Last 3 Encounters:  02/22/24 76.7 kg (169 lb 3.2 oz)  12/25/23 77.6 kg (171 lb)  11/17/23 73.5 kg (162 lb)   General. Not i VS reviewed     Eyes. Sclera and conjunctiva clear; Vision grossly intact; extraocular movements intact Oropharynx. No suspicious lesions Neck. Supple. No swelling, masses, thyroid  normal size, no masses palpated.   Cervical collar noted  Lungs. Respirations unlabored; CTA No chest wall tenderness or deformity Cardiovascular. Heart  regular rate and rhythm without murmurs, gallops, or rubs Abdomen: Soft - Non tender   . No masses felt RECTAL; Declined  Actinic keratosis on arms and face  Extremities; Left knee OA noted  Rt TKA scar noted  Poor peripheral pulses  Neurologic.  Tremors noted  Alert and oriented x3; CN 2-12 grossly intact; no focal deficits  Assessment and Plan:  1 CLL:- Stable- Sees Dr. 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 Gregorio and Dr. Chrystie  [facility])  3  Memory loss/ Cognitive impairment:.  On  Aricept  10 mg po qd   4 Low back pain and  Left trochanteric bursitis; On Gabapentin  100 mg po  q hs n Tylenol  for pain  5 A-fib with slow ventricular response; Sees Dr. Florencio ECHO: Left ventricular ejection fraction, by estimation, is 60 to 65%. The left ventricle has normal function.  On Eliquis   6 Irritability and mood changes; Doing better- On Cymbalta  20 mg po qd  7 HTN- No longer on On Lisinopril - Monitor  8 GERD- On Famotidine   9 Tremors:Stable- States he is doing better-  Primidone  has not helped much 10 Ca Prostate:No longer sees Urology  11 Low se Ferritin: On Oral iron  replacement  12 Hypothyroidism:  On  Levothyroxine  50 mcg po qd Monday thru Friday and 75 mcg on Sat and Sundays -  TSH: 2.968 Monitor  13 Elevated A1c (6.3); Rec :Low carb diet. Monitor 14 Health Maintenance: Up todate with Flu shot Pneumovax  , Inj Shingrix Zostavax and COVID vaccine  Colonoscopy- 2011- Dr. Claudene - 3 polyps, Repeated in May 2013- Single tubular adenoma - 12 cms form Os Repeated in Dec 2018: 3 polyps in rectum, 1 polyp in descending colon, and Divertics in sigmoid,Transverse and ascending colon  Discussed results of labs Prescription sent for meds  PSA- Normal (< 0.01 ) Follow up in 6 months      Tamra Leventhal  MD

## 2024-03-03 NOTE — Progress Notes (Addendum)
 " Dermatology Note   Assessment and Plan:    Assessment & Plan  Shave Removal and Treatment Neoplasm Unsp x 2 (A1. R-forehead, B2. L-temple):   Risks, benefits and adverse effects of shave removal were reviewed.  After obtaining verbal consent, the sites were anesthetized using 1% lidocaine  with epinephrine .  Tissue sites were obtained using a dermblade and hemostasis was achieved using drysol.    Immediately after the removal, the sites were treated using curettage and IL5FU as described below:  Curettage was performed x 2 passes. The base was injected with 5-Fluorouracil x 1 ml. Area was cleaned, dressed and post-procedure wound care instructions were reviewed.   Lot #: [id number] Exp: 12/26  Treatment Area (# of treatments by date) Tumor Size (mm)  (1) R-forehead ISK 21mm  (2) L-temple  AK 12mm     History of NMSC:   No evidence of recurrence The importance of regular skin checks and sun protection was reviewed If pt experiences any tenderness at site or any regrowth/nodularity, he/she should contact our office for further evaluation   Seborrheic Keratoses:  I counseled the patient regarding the following:  Seborrheic keratoses are benign and no treatment is necessary. Lesions can be warty, smooth, flat or raised. Patients can get more of these lesions as they age.   If lesions change and become enlarged, tender, burn or change colors, pt was instructed to contact us  for further evaluation.   Benign appearing lentigines and actinic skin damage:  Pt was reassured.  No concerning lesions warranting biopsy The importance of sun protection and using OTC  broad spectrum, SPF 30 or higher sunscreen was reviewed.     The patient was advised to call for an appointment should any new, changing, or symptomatic lesions develop.   RTC: 6 mo   I personally spent 30 minutes face-to-face and non-face-to-face in the care of this patient, which includes all pre, intra, and post visit time  on the date of service distinct from any time spent performing procedure(s).   _________________________________________________________________   Chief Complaint   Chief Complaint  Patient presents with   Hair/Scalp Problem    Rough places that he is picking at    HPI   Allen Chang is a 89 y.o. male who presents as a returning patient (last seen 10/15/2023) to Dermatology for a waist up skin exam. Patient reports several lesions of concern.   History of Present Illness Allen Chang is an 89 year old male who presents with concerns about skin lesions on his scalp and forehead.  He has persistent lesions on his scalp and forehead previously treated with liquid nitrogen but not resolved. He frequently picks at them and they occasionally bleed, including a recent episode from a forehead lesion. He also has a spot on his knuckle that he wants evaluated.   The patient denies any other new or changing lesions or areas of concern.   Pertinent Past Medical History   Problem List       Other   Personal history of skin cancer - Primary   Skin Cancer History- Non-Melanoma Skin Cancer  Diagnosis Location Biopsy Date Treatment date Procedure Surgeon  SCCis Scalp 08/13/23  Efudex   SCCis L hand 08/13/23 Planning for 10/26/23 Mohs   iSCC R prox med hand 06/18/22 07/17/2022 Mohs Dr. Gregorio  iSCC R dorsal hand 11/21/21 12/11/21 [facility]  Sup-nod [facility] Upper back 07/17/21 - MONITOR for recurrence   SCCis Left neck 07/17/21 08/05/21 Mohs [facility]  SCCis Right cheek 07/17/21 08/05/21 [facility] [place]  [facility] Right wrist 04/17/21 04/17/21 [facility]  [facility] Right neck 07/13/20 08/13/20 Excision [facility]  [facility] Left elbow  11/2019 Excision   [facility] Left temple/hairline  08/04/19 [facility] [place]  [facility] R forehead  05/2012 [facility] [place]  [facility] Upper back  06/2010 [facility] [place]  SCCIS R Hand  02/2009 [facility] [place]  BCC L upper helix  11/2006 [facility] [place]  [facility] R Hand  10/2006 [facility] [place]  SCCIS L 4th finger  11/2001  [facility] [place]  [facility] R Hand  06/2001 [facility] [place]  - Numerous previous NMSC treated by Dr Charlott as well      Past Medical History, Family History, Social History, Medication List, Allergies, and Problem List were reviewed in the rooming section of Epic.   Physical Examination   Physical Exam: Patient is a well developed caucasian male in no apparent distress.  Alert and oriented x 3.  Skin: Examination of the patient's head, neck, chest, abdomen, back, buttocks, bilateral upper and lower extremities was performed and all areas not specifically commented on were within normal limits: - scattered tan macules, rhytids and speckled pigment change c/w previous sun exposure  - well-healed scars at sites of previous excisions/biopsies  - several scattered stuck-on tan macules and papules over trunk and extremities   -- concerning areas as shown below (see dermpath order for details on site and size):         (Approved Template 03/21/2023) "

## 2024-03-17 ENCOUNTER — Inpatient Hospital Stay
Admission: EM | Admit: 2024-03-17 | Discharge: 2024-03-21 | DRG: 178 | Disposition: A | Discharge status: Skilled Nursing Facility | Attending: Internal Medicine | Admitting: Internal Medicine

## 2024-03-17 ENCOUNTER — Emergency Department

## 2024-03-17 ENCOUNTER — Other Ambulatory Visit: Payer: Self-pay

## 2024-03-17 DIAGNOSIS — D472 Monoclonal gammopathy: Secondary | ICD-10-CM | POA: Diagnosis present

## 2024-03-17 DIAGNOSIS — W19XXXA Unspecified fall, initial encounter: Principal | ICD-10-CM

## 2024-03-17 DIAGNOSIS — G9340 Encephalopathy, unspecified: Secondary | ICD-10-CM

## 2024-03-17 DIAGNOSIS — R531 Weakness: Secondary | ICD-10-CM

## 2024-03-17 DIAGNOSIS — R319 Hematuria, unspecified: Secondary | ICD-10-CM | POA: Diagnosis present

## 2024-03-17 DIAGNOSIS — I10 Essential (primary) hypertension: Secondary | ICD-10-CM | POA: Diagnosis present

## 2024-03-17 DIAGNOSIS — I4891 Unspecified atrial fibrillation: Secondary | ICD-10-CM | POA: Diagnosis present

## 2024-03-17 DIAGNOSIS — F0390 Unspecified dementia without behavioral disturbance: Secondary | ICD-10-CM | POA: Diagnosis present

## 2024-03-17 DIAGNOSIS — E119 Type 2 diabetes mellitus without complications: Secondary | ICD-10-CM

## 2024-03-17 DIAGNOSIS — N183 Chronic kidney disease, stage 3 unspecified: Secondary | ICD-10-CM | POA: Diagnosis present

## 2024-03-17 DIAGNOSIS — C9110 Chronic lymphocytic leukemia of B-cell type not having achieved remission: Secondary | ICD-10-CM | POA: Diagnosis present

## 2024-03-17 DIAGNOSIS — D539 Nutritional anemia, unspecified: Secondary | ICD-10-CM | POA: Diagnosis present

## 2024-03-17 DIAGNOSIS — U071 COVID-19: Secondary | ICD-10-CM

## 2024-03-17 DIAGNOSIS — E039 Hypothyroidism, unspecified: Secondary | ICD-10-CM | POA: Diagnosis present

## 2024-03-17 DIAGNOSIS — E785 Hyperlipidemia, unspecified: Secondary | ICD-10-CM | POA: Diagnosis present

## 2024-03-17 DIAGNOSIS — D696 Thrombocytopenia, unspecified: Secondary | ICD-10-CM | POA: Diagnosis present

## 2024-03-17 LAB — CBC
HCT: 42.4 % (ref 39.0–52.0)
Hemoglobin: 14 g/dL (ref 13.0–17.0)
MCH: 34.7 pg — ABNORMAL HIGH (ref 26.0–34.0)
MCHC: 33 g/dL (ref 30.0–36.0)
MCV: 105.2 fL — ABNORMAL HIGH (ref 80.0–100.0)
Platelets: 125 10*3/uL — ABNORMAL LOW (ref 150–400)
RBC: 4.03 MIL/uL — ABNORMAL LOW (ref 4.22–5.81)
RDW: 13.2 % (ref 11.5–15.5)
WBC: 9.2 10*3/uL (ref 4.0–10.5)
nRBC: 0 % (ref 0.0–0.2)

## 2024-03-17 LAB — HEPATIC FUNCTION PANEL
ALT: 25 U/L (ref 0–44)
AST: 20 U/L (ref 15–41)
Albumin: 4.3 g/dL (ref 3.5–5.0)
Alkaline Phosphatase: 79 U/L (ref 38–126)
Bilirubin, Direct: 0.4 mg/dL — ABNORMAL HIGH (ref 0.0–0.2)
Indirect Bilirubin: 0.5 mg/dL (ref 0.3–0.9)
Total Bilirubin: 0.8 mg/dL (ref 0.0–1.2)
Total Protein: 7.6 g/dL (ref 6.5–8.1)

## 2024-03-17 LAB — URINALYSIS, ROUTINE W REFLEX MICROSCOPIC
Bacteria, UA: NONE SEEN
Bilirubin Urine: NEGATIVE
Glucose, UA: NEGATIVE mg/dL
Hgb urine dipstick: NEGATIVE
Ketones, ur: NEGATIVE mg/dL
Leukocytes,Ua: NEGATIVE
Nitrite: NEGATIVE
Protein, ur: 30 mg/dL — AB
Specific Gravity, Urine: 1.019 (ref 1.005–1.030)
pH: 7 (ref 5.0–8.0)

## 2024-03-17 LAB — CK: Total CK: 32 U/L — ABNORMAL LOW (ref 49–397)

## 2024-03-17 LAB — BASIC METABOLIC PANEL WITH GFR
Anion gap: 11 (ref 5–15)
BUN: 21 mg/dL (ref 8–23)
CO2: 27 mmol/L (ref 22–32)
Calcium: 9.9 mg/dL (ref 8.9–10.3)
Chloride: 99 mmol/L (ref 98–111)
Creatinine, Ser: 1.3 mg/dL — ABNORMAL HIGH (ref 0.61–1.24)
GFR, Estimated: 53 mL/min — ABNORMAL LOW
Glucose, Bld: 123 mg/dL — ABNORMAL HIGH (ref 70–99)
Potassium: 4.1 mmol/L (ref 3.5–5.1)
Sodium: 137 mmol/L (ref 135–145)

## 2024-03-17 LAB — RESP PANEL BY RT-PCR (RSV, FLU A&B, COVID)  RVPGX2
Influenza A by PCR: NEGATIVE
Influenza B by PCR: NEGATIVE
Resp Syncytial Virus by PCR: NEGATIVE
SARS Coronavirus 2 by RT PCR: POSITIVE — AB

## 2024-03-17 LAB — TROPONIN T, HIGH SENSITIVITY
Troponin T High Sensitivity: 27 ng/L — ABNORMAL HIGH (ref 0–19)
Troponin T High Sensitivity: 32 ng/L — ABNORMAL HIGH (ref 0–19)

## 2024-03-17 LAB — TSH: TSH: 1.94 u[IU]/mL (ref 0.350–4.500)

## 2024-03-17 LAB — GLUCOSE, CAPILLARY
Glucose-Capillary: 143 mg/dL — ABNORMAL HIGH (ref 70–99)
Glucose-Capillary: 132 mg/dL — ABNORMAL HIGH (ref 70–99)

## 2024-03-17 LAB — MAGNESIUM: Magnesium: 1.9 mg/dL (ref 1.7–2.4)

## 2024-03-17 MED ORDER — APIXABAN 5 MG PO TABS
5.0000 mg | ORAL_TABLET | Freq: Two times a day (BID) | ORAL | Status: DC
  Administered 2024-03-17 – 2024-03-21 (×8): 5 mg via ORAL
  Filled 2024-03-17 (×8): qty 1

## 2024-03-17 MED ORDER — PANTOPRAZOLE SODIUM 40 MG PO TBEC
40.0000 mg | DELAYED_RELEASE_TABLET | Freq: Two times a day (BID) | ORAL | Status: DC
  Administered 2024-03-17 – 2024-03-21 (×8): 40 mg via ORAL
  Filled 2024-03-17 (×8): qty 1

## 2024-03-17 MED ORDER — LEVOTHYROXINE SODIUM 50 MCG PO TABS
50.0000 ug | ORAL_TABLET | ORAL | Status: DC
  Administered 2024-03-18 – 2024-03-21 (×2): 50 ug via ORAL
  Filled 2024-03-17 (×2): qty 1

## 2024-03-17 MED ORDER — ACETAMINOPHEN 325 MG PO TABS
650.0000 mg | ORAL_TABLET | Freq: Four times a day (QID) | ORAL | Status: DC | PRN
  Administered 2024-03-17 – 2024-03-21 (×4): 650 mg via ORAL
  Filled 2024-03-17 (×4): qty 2

## 2024-03-17 MED ORDER — PROPRANOLOL HCL 20 MG PO TABS
10.0000 mg | ORAL_TABLET | Freq: Two times a day (BID) | ORAL | Status: DC
  Administered 2024-03-17 – 2024-03-21 (×8): 10 mg via ORAL
  Filled 2024-03-17 (×8): qty 1

## 2024-03-17 MED ORDER — SENNOSIDES-DOCUSATE SODIUM 8.6-50 MG PO TABS
1.0000 | ORAL_TABLET | Freq: Every evening | ORAL | Status: DC | PRN
  Administered 2024-03-17 – 2024-03-21 (×2): 1 via ORAL
  Filled 2024-03-17 (×2): qty 1

## 2024-03-17 MED ORDER — SODIUM CHLORIDE 0.9 % IV SOLN: 200.0000 mg | Freq: Once | INTRAVENOUS | Status: DC

## 2024-03-17 MED ORDER — INSULIN ASPART 100 UNIT/ML IJ SOLN
0.0000 [IU] | Freq: Three times a day (TID) | INTRAMUSCULAR | Status: DC
  Administered 2024-03-17: 1 [IU] via SUBCUTANEOUS
  Administered 2024-03-18: 2 [IU] via SUBCUTANEOUS
  Administered 2024-03-18: 1 [IU] via SUBCUTANEOUS
  Administered 2024-03-19: 2 [IU] via SUBCUTANEOUS
  Administered 2024-03-19 – 2024-03-20 (×2): 1 [IU] via SUBCUTANEOUS
  Filled 2024-03-17 (×3): qty 1
  Filled 2024-03-17: qty 5
  Filled 2024-03-17: qty 2
  Filled 2024-03-17: qty 1
  Filled 2024-03-17: qty 2

## 2024-03-17 MED ORDER — SODIUM CHLORIDE 0.9% FLUSH
3.0000 mL | Freq: Two times a day (BID) | INTRAVENOUS | Status: DC
  Administered 2024-03-17 – 2024-03-21 (×9): 3 mL via INTRAVENOUS

## 2024-03-17 MED ORDER — SODIUM CHLORIDE 0.9 % IV BOLUS
500.0000 mL | Freq: Once | INTRAVENOUS | Status: AC
  Administered 2024-03-17: 500 mL via INTRAVENOUS

## 2024-03-17 MED ORDER — LEVOTHYROXINE SODIUM 50 MCG PO TABS
75.0000 ug | ORAL_TABLET | ORAL | Status: DC
  Administered 2024-03-19 – 2024-03-20 (×2): 75 ug via ORAL
  Filled 2024-03-17 (×2): qty 1

## 2024-03-17 MED ORDER — INSULIN ASPART 100 UNIT/ML IJ SOLN: 0.0000 [IU] | Freq: Every day | INTRAMUSCULAR | Status: DC

## 2024-03-17 MED ORDER — ACETAMINOPHEN 650 MG RE SUPP: 650.0000 mg | Freq: Four times a day (QID) | RECTAL | Status: DC | PRN

## 2024-03-17 MED ORDER — ONDANSETRON HCL 4 MG/2ML IJ SOLN: 4.0000 mg | Freq: Four times a day (QID) | INTRAMUSCULAR | Status: DC | PRN

## 2024-03-17 MED ORDER — SODIUM CHLORIDE 0.9 % IV SOLN: 100.0000 mg | Freq: Every day | INTRAVENOUS | Status: DC

## 2024-03-17 MED ORDER — ONDANSETRON HCL 4 MG PO TABS: 4.0000 mg | ORAL_TABLET | Freq: Four times a day (QID) | ORAL | Status: DC | PRN

## 2024-03-17 MED ORDER — LACTATED RINGERS IV SOLN: INTRAVENOUS | Status: AC

## 2024-03-17 MED ORDER — ACETAMINOPHEN 500 MG PO TABS
1000.0000 mg | ORAL_TABLET | Freq: Once | ORAL | Status: AC
  Administered 2024-03-17: 1000 mg via ORAL
  Filled 2024-03-17: qty 2

## 2024-03-17 MED ORDER — TIMOLOL MALEATE 0.5 % OP SOLN
1.0000 [drp] | Freq: Every day | OPHTHALMIC | Status: DC
  Administered 2024-03-18 – 2024-03-21 (×4): 1 [drp] via OPHTHALMIC
  Filled 2024-03-17 (×2): qty 5

## 2024-03-17 NOTE — ED Triage Notes (Signed)
 Pt comes via EMS from home with c/o multiple falls. Pt not on thinners. Pt doesn't remember falls. Pt has dementia and at baseline.  Pt states head is hurting right now. Pt was found on floor. Unknown if any loc.

## 2024-03-17 NOTE — ED Provider Notes (Addendum)
 "  [facility] Provider Note    Event Date/Time   First MD Initiated Contact with Patient 03/17/24 1156     (approximate)   History   Fall   HPI  Allen Chang is a 89 y.o. male with a history of dementia who comes in with concerns for a fall.  According to family member he only had 1 fall today.  There was not multiple falls.  She reported that yesterday he was coughing a lot and she felt like he was getting sick.  He does not wanting to eat or drink.  They report that he was more weak and more confused this morning than normal.  They were trying to get him up and to help him get to the bathroom when he got confused and was going to the den instead and there was a mechanical fall.  Physical Exam   Triage Vital Signs: ED Triage Vitals [03/17/24 1034]  Encounter Vitals Group     BP (!) 158/73     Girls Systolic BP Percentile      Girls Diastolic BP Percentile      Boys Systolic BP Percentile      Boys Diastolic BP Percentile      Pulse Rate 60     Resp 18     Temp 99.9 F (37.7 C)     Temp src      SpO2 96 %     Weight      Height      Head Circumference      Peak Flow      Pain Score      Pain Loc      Pain Education      Exclude from Growth Chart     Most recent vital signs: Vitals:   03/17/24 1034  BP: (!) 158/73  Pulse: 60  Resp: 18  Temp: 99.9 F (37.7 C)  SpO2: 96%     General: Awake, no distress.  CV:  Good peripheral perfusion.  Resp:  Normal effort.  Abd:  No distention.  Other:  Patient unclear why he is here.  He is moving all extremities well.  Got no chest wall tenderness no abdominal tenderness.  Patient's got no CTL spine tenderness.  No bruising noted.   ED Results / Procedures / Treatments   Labs (all labs ordered are listed, but only abnormal results are displayed) Labs Reviewed  CBC - Abnormal; Notable for the following components:      Result Value   RBC 4.03 (*)    MCV 105.2 (*)    MCH 34.7 (*)     Platelets 125 (*)    All other components within normal limits  BASIC METABOLIC PANEL WITH GFR - Abnormal; Notable for the following components:   Glucose, Bld 123 (*)    Creatinine, Ser 1.30 (*)    GFR, Estimated 53 (*)    All other components within normal limits  URINALYSIS, ROUTINE W REFLEX MICROSCOPIC  CK     EKG  My interpretation of EKG:  Artifact versus atrial fibrillation with a rate of 77 without any ST elevation or T wave inversions, normal intervals  RADIOLOGY I have reviewed the xray personally and interpreted possible pna?    PROCEDURES:  Critical Care performed: No  Procedures   MEDICATIONS ORDERED IN ED: Medications - No data to display   IMPRESSION / MDM / ASSESSMENT AND PLAN / ED COURSE  I reviewed the triage vital  signs and the nursing notes.   Patient's presentation is most consistent with acute presentation with potential threat to life or bodily function.   Patient is EKG concerning for A-fib.  Did review prior and he does have a history of A-fib patient is on Eliquis  for this.  CT imaging done to evaluate for intracranial hemorrhage, cervical fracture.  Patient does not seem to have any chest wall pain, pelvic pain but x-rays were ordered for screening given patient's dementia.  BMP shows creatinine that is around baseline at 1.3.  CBC shows normal white count  CT head and neck were negative for acute pathology.  Hepatic function reassuring troponin slightly elevated more likely demand urine without evidence of UTI  1. Low lung volumes with patchy opacity at the right costophrenic angle and blunting of the right lateral costophrenic angle, which may reflect atelectasis/scarring and/or a small pleural effusion. 2. Calcified aorta consistent with atherosclerosis. 3. Old healed right distal clavicle fracture.  Patient with difficulty ambulating lives with wife.  She is having difficulties taking care of him given the weakness  discussed with  the hospital team for admission.       FINAL CLINICAL IMPRESSION(S) / ED DIAGNOSES   Final diagnoses:  Fall, initial encounter  Acute encephalopathy  COVID-19  Weakness     Rx / DC Orders   ED Discharge Orders     None        Note:  This document was prepared using Dragon voice recognition software and may include unintentional dictation errors.   Ernest Ronal BRAVO, MD 03/17/24 1432    Ernest Ronal BRAVO, MD 03/17/24 1434  "

## 2024-03-17 NOTE — H&P (Addendum)
 " History and Physical    Allen Chang FMW:[id number] DOB: 22-Feb-1932 DOA: 03/17/2024  DOS: the patient was seen and examined on 03/17/2024  PCP: Sadie Manna, MD   Patient coming from: Home  I have personally briefly reviewed patient's old medical records in [facility] and CareEverywhere  HPI:   Allen Chang is a 89 y.o. year old male with medical history of HTN, HLD, T2DM, atrial fibrillation, dementia, hypothyroidism, CKDIIIa, CLL presenting to the ED after a fall. Fall appears to be mechanical. Pt is limited historian and unable to provide history. Per wife pt has been having URI symptoms since yesterday. He is more confused than baseline and weaker then baseline. Denies any fevers or chills. Lab work and imaging obtained. CBC without leukocytosis, baseline hgb and plt count. BMP with baseline renal function. UA without signs of infection but with increased RBC. Resp panel positive for COVID. CK normal. Troponin mildly elevated so repeat pending. CT head and C spine without any acute findings. DG pelvis without any acute findings. DG chest with right costophrenic blunting concerning for atelectasis vs small pleural effusion. Given need for continued care, [facility] contacted for admission.  Review of Systems: unable to review all systems due to the inability of the patient to answer questions.   Past Medical History:  Diagnosis Date   Cancer (HCC)    prostate    CKD (chronic kidney disease) stage 3, GFR 30-59 ml/min (HCC) 10/01/2016   Complication of anesthesia    bradycardia, in ICU for 24 hour after galbladder surgery.    Diverticulosis 2012   Dysrhythmia    Heart skips a beat   Elevated lipids    GERD (gastroesophageal reflux disease)    History of hiatal hernia    Hypertension    Hypothyroidism    Leukemia (HCC)    Prostate cancer (HCC)    Skin cancer    face, scalp, behind ear,back and hand   Tremors of nervous system     Past Surgical History:  Procedure  Laterality Date   BLADDER TUMOR EXCISION     CARDIAC CATHETERIZATION     CATARACT EXTRACTION W/PHACO Right 01/16/2022   Procedure: CATARACT EXTRACTION PHACO AND INTRAOCULAR LENS PLACEMENT (IOC) RIGHT DIABETIC KAHOOK DUAL BLADE GONIOTOMY 14.34 01:38.4;  Surgeon: Mittie Gaskin, MD;  Location: [facility];  Service: Ophthalmology;  Laterality: Right;   CATARACT EXTRACTION W/PHACO Left 01/29/2022   Procedure: CATARACT EXTRACTION PHACO AND INTRAOCULAR LENS PLACEMENT (IOC) LEFT DIABETIC KAHOOK DUAL BLADE GONIOTOMY MALYUGIN  12.91  01:31.0;  Surgeon: Mittie Gaskin, MD;  Location: [facility];  Service: Ophthalmology;  Laterality: Left;   CHOLECYSTECTOMY N/A 10/10/2014   Procedure: LAPAROSCOPIC CHOLECYSTECTOMY WITH INTRAOPERATIVE CHOLANGIOGRAM;  Surgeon: Larinda Unknown Sharps, MD;  Location: [facility];  Service: General;  Laterality: N/A;   COLONOSCOPY WITH PROPOFOL  N/A 02/16/2017   Procedure: COLONOSCOPY WITH PROPOFOL ;  Surgeon: Viktoria Lamar DASEN, MD;  Location: [facility];  Service: Endoscopy;  Laterality: N/A;   ESOPHAGOGASTRODUODENOSCOPY (EGD) WITH PROPOFOL  N/A 02/16/2017   Procedure: ESOPHAGOGASTRODUODENOSCOPY (EGD) WITH PROPOFOL ;  Surgeon: Viktoria Lamar DASEN, MD;  Location: [facility];  Service: Endoscopy;  Laterality: N/A;   HEMORRHOID SURGERY     HERNIA REPAIR Left    x2   JOINT REPLACEMENT Left    Partial Knee Replacement, Dr. Edie   PARTIAL KNEE ARTHROPLASTY Left 12/12/2014   Procedure: UNICOMPARTMENTAL KNEE;  Surgeon: Norleen JINNY Edie, MD;  Location: [facility];  Service: Orthopedics;  Laterality: Left;   PARTIAL  KNEE ARTHROPLASTY Right 09/25/2020   Procedure: RIGHT PARTIAL KNEE REPLACEMENT;  Surgeon: Edie Norleen PARAS, MD;  Location: [facility];  Service: Orthopedics;  Laterality: Right;   prostate seeding     SHOULDER ARTHROSCOPY Right    SHOULDER ARTHROSCOPY WITH OPEN ROTATOR CUFF REPAIR Left 02/07/2016   Procedure: SHOULDER ARTHROSCOPY WITH OPEN ROTATOR CUFF  REPAIR;  Surgeon: Norleen PARAS Edie, MD;  Location: [facility];  Service: Orthopedics;  Laterality: Left;     Allergies[1]  Family History  Problem Relation Age of Onset   Bone cancer Father    Hypertension Mother    Osteoporosis Mother    Breast cancer Sister    Cancer Brother    Cancer Brother    Lung cancer Brother    Bladder Cancer Brother    Melanoma Brother     Prior to Admission medications  Medication Sig Start Date End Date Taking? Authorizing Provider  acetaminophen  (TYLENOL ) 500 MG tablet Take 1,000 mg by mouth every 8 (eight) hours as needed for moderate pain.    [provider]  albuterol  (VENTOLIN  HFA) 108 (90 Base) MCG/ACT inhaler Inhale 2 puffs into the lungs every 6 (six) hours as needed for wheezing or shortness of breath. Patient not taking: Reported on 12/18/2023 02/06/21   Josette Ade, MD  apixaban  (ELIQUIS ) 5 MG TABS tablet Take 5 mg by mouth. 08/14/23   [provider]  ascorbic acid  (VITAMIN C) 1000 MG tablet Take 1,000 mg by mouth daily.    [provider]  b complex vitamins capsule Take 1 capsule by mouth daily.    [provider]  calcium  carbonate (OSCAL) 1500 (600 Ca) MG TABS tablet Take 1,500 mg by mouth daily with breakfast.    [provider]  Cholecalciferol  25 MCG (1000 UT) tablet Take 1,000 Units by mouth daily.    [provider]  cyanocobalamin  100 MCG tablet Take 100 mcg by mouth daily. Patient not taking: Reported on 12/18/2023    [provider]  donepezil  (ARICEPT ) 10 MG tablet Take 10 mg by mouth at bedtime. 05/24/20   [provider]  DULoxetine  (CYMBALTA ) 20 MG capsule Take 20 mg by mouth daily. 06/08/19   [provider]  famotidine  (PEPCID ) 40 MG tablet Take 40 mg by mouth 2 (two) times daily. Patient not taking: Reported on 12/18/2023 04/28/19   [provider]  ferrous sulfate  325 (65 FE) MG EC tablet Take 325 mg by mouth 3 (three) times daily with  meals.    [provider]  Flaxseed, Linseed, (FLAX SEED OIL) 1000 MG CAPS Take 1,000 mg by mouth daily.    [provider]  fluorouracil (EFUDEX) 5 % cream Apply to the back of your right hand twice per day for 5 to 7 days. STOP when the area becomes red and irritated even if you haven't done the full 7 days. Patient not taking: Reported on 12/18/2023 01/22/22   [provider]  furosemide (LASIX) 20 MG tablet Take 20 mg by mouth daily. 07/10/21   [provider]  gabapentin  (NEURONTIN ) 100 MG capsule Take 1 capsule by mouth at bedtime. 05/17/21 12/18/23  [provider]  Garlic  1000 MG CAPS Take 1 capsule by mouth every morning.    [provider]  Ginkgo Biloba (GINKOBA PO) Take 2 tablets by mouth daily.    [provider]  latanoprost  (XALATAN ) 0.005 % ophthalmic solution Place 1 drop into both eyes at bedtime.    [provider]  levothyroxine  (SYNTHROID ) 50 MCG tablet Take 50 mcg by mouth daily before breakfast. Take on an empty stomach with a glass of water  at least 30-60 minutes before breakfast. 04/17/20 12/18/23  [provider]  levothyroxine  (SYNTHROID ) 50 MCG tablet Take 50 mcg by mouth. Monday through Friday on an empty stomach with a glass of water  at least 30 to 60 minutes before breakfast 01/25/24   [provider]  levothyroxine  (SYNTHROID ) 75 MCG tablet Take 75 mcg by mouth. Every Saturday and Sunday on an empty stomach with a glass of water  at least 30 to 60 minutes before breakfast 01/25/24   [provider]  loratadine  (CLARITIN  REDITABS) 10 MG dissolvable tablet Take 10 mg by mouth daily. In am    [provider]  Misc Natural Products (BLACK CHERRY CONCENTRATE) LIQD Take 15 mLs by mouth daily.     [provider]  MISC NATURAL PRODUCTS PO Take 2 capsules by mouth daily. Beet juice capsules    [provider]  Omega-3 Fatty Acids (FISH OIL) 1000 MG CAPS Take 1  capsule by mouth daily.    [provider]  OVER THE COUNTER MEDICATION Take 1 capsule by mouth daily. Neuriva    [provider]  OVER THE COUNTER MEDICATION Take 1 capsule by mouth daily. Ageless Brain    [provider]  OVER THE COUNTER MEDICATION Take 1,000 mg by mouth daily. BACOPA    [provider]  pantoprazole  (PROTONIX ) 40 MG tablet Take 40 mg by mouth 2 (two) times daily. 03/14/21   [provider]  propranolol  (INDERAL ) 10 MG tablet Take 10 mg by mouth 2 (two) times daily.    [provider]  timolol  (TIMOPTIC ) 0.5 % ophthalmic solution 1 drop daily. 08/18/23   [provider]  vitamin E  180 MG (400 UNITS) capsule Take 400 Units by mouth daily.    [provider]  zinc  gluconate 50 MG tablet Take 50 mg by mouth daily.    [provider]    Social History:  reports that he has never smoked. He has never used smokeless tobacco. He reports that he does not drink alcohol and does not use drugs.    Physical Exam: Vitals:   03/17/24 1034 03/17/24 1552  BP: (!) 158/73   Pulse: 60   Resp: 18   Temp: 99.9 F (37.7 C) 98.1 F (36.7 C)  TempSrc:  Axillary  SpO2: 96%     Gen: NAD HENT: NCAT CV: IRIR Lung: rhonchi present diffusely Abd: No TTP, normal bowel sounds MSK: No asymmetry, decreased muscle bulk Neuro: alert and oriented x1, oriented to self only   Labs on Admission: I have personally reviewed following labs and imaging studies  CBC: Recent Labs  Lab 03/17/24 1033  WBC 9.2  HGB 14.0  HCT 42.4  MCV 105.2*  PLT 125*   Basic Metabolic Panel: Recent Labs  Lab 03/17/24 1033  NA 137  K 4.1  CL 99  CO2 27  GLUCOSE 123*  BUN 21  CREATININE 1.30*  CALCIUM  9.9   GFR: CrCl cannot be calculated (Unknown ideal weight.). Liver Function Tests: Recent Labs  Lab 03/17/24 1259  AST 20  ALT 25  ALKPHOS 79  BILITOT 0.8  PROT 7.6  ALBUMIN 4.3   No results for input(s):  LIPASE, AMYLASE in the last 168 hours. No results for input(s): AMMONIA in the last 168 hours. Coagulation Profile: No results for input(s): INR, PROTIME in the last 168 hours.  Cardiac Enzymes: Recent Labs  Lab 03/17/24 1259  CKTOTAL 32*   BNP (last 3 results) No results for input(s): BNP in the last 8760 hours. HbA1C: No results for input(s): HGBA1C in the last 72 hours. CBG: No results for input(s): GLUCAP in the last 168 hours. Lipid Profile: No results for input(s): CHOL, HDL, LDLCALC, TRIG, CHOLHDL, LDLDIRECT in the last 72 hours. Thyroid  Function Tests: No results for input(s): TSH, T4TOTAL, FREET4, T3FREE, THYROIDAB in the last 72 hours. Anemia Panel: No results for input(s): VITAMINB12, FOLATE, FERRITIN, TIBC, IRON , RETICCTPCT in the last 72 hours. Urine analysis:    Component Value Date/Time   COLORURINE YELLOW (A) 03/17/2024 1236   APPEARANCEUR CLEAR (A) 03/17/2024 1236   APPEARANCEUR Clear 05/28/2020 1146   LABSPEC 1.019 03/17/2024 1236   LABSPEC 1.016 05/22/2013 0646   PHURINE 7.0 03/17/2024 1236   GLUCOSEU NEGATIVE 03/17/2024 1236   GLUCOSEU Negative 05/22/2013 0646   HGBUR NEGATIVE 03/17/2024 1236   BILIRUBINUR NEGATIVE 03/17/2024 1236   BILIRUBINUR Negative 05/28/2020 1146   BILIRUBINUR Negative 05/22/2013 0646   KETONESUR NEGATIVE 03/17/2024 1236   PROTEINUR 30 (A) 03/17/2024 1236   NITRITE NEGATIVE 03/17/2024 1236   LEUKOCYTESUR NEGATIVE 03/17/2024 1236   LEUKOCYTESUR Negative 05/22/2013 0646    Radiological Exams on Admission: I have personally reviewed images CT Cervical Spine Wo Contrast Result Date: 03/17/2024 EXAM: CT CERVICAL SPINE WITHOUT CONTRAST 03/17/2024 12:16:36 PM TECHNIQUE: CT of the cervical spine was performed without the administration of intravenous contrast. Multiplanar reformatted images are provided for review. Automated exposure control, iterative reconstruction, and/or weight based  adjustment of the mA/kV was utilized to reduce the radiation dose to as low as reasonably achievable. COMPARISON: CT of the cervical spine dated 11/05/2023. CLINICAL HISTORY: fall Fall. FINDINGS: BONES AND ALIGNMENT: There has been interval healing of nondisplaced fracture of the C5 spinous process. There is no acute fracture present. No traumatic malalignment. DEGENERATIVE CHANGES: There is mild diffuse degenerative disc disease and facet arthrosis throughout the cervical spine. SOFT TISSUES: No prevertebral soft tissue swelling. There is moderate calcific plaque within the carotid bulbs. IMPRESSION: 1. Interval healing of nondisplaced fracture of the C5 spinous process. No acute fracture. 2. Mild diffuse degenerative disc disease and facet arthrosis throughout the cervical spine. 3. Moderate calcific plaque within the carotid bulbs. Electronically signed by: Evalene Coho MD 03/17/2024 12:29 PM EST RP Workstation: [id number]   DG Pelvis 1-2 Views Result Date: 03/17/2024 EXAM: 1-2 VIEW(S) XRAY OF THE PELVIS 03/17/2024 12:11:00 PM COMPARISON: None available. CLINICAL HISTORY: fall Fall. FINDINGS: BONES AND JOINTS: The bony pelvis is intact. No acute fracture. No malalignment. SOFT TISSUES: There are prostate seed implants present. There are numerous staples projecting over the left pelvis. IMPRESSION: 1. No acute pelvic fracture or dislocation. 2. Prostate brachytherapy seeds and left pelvic surgical staples noted. Electronically signed by: Evalene Coho MD 03/17/2024 12:23 PM EST RP Workstation: [id number]   DG Chest Portable 1 View Result Date: 03/17/2024 EXAM: 1 VIEW XRAY OF THE CHEST 03/17/2024 12:11:00 PM COMPARISON: 08/06/2023 CLINICAL HISTORY: Fall. FINDINGS: LUNGS AND PLEURA: Low lung volumes. Patchy airspace opacity right costophrenic angle. Blunting of right lateral costophrenic angle. No pneumothorax. HEART AND MEDIASTINUM: Calcified aorta. No acute abnormality of the cardiac and mediastinal  silhouettes. BONES AND SOFT TISSUES: Old healed right distal clavicle fracture. No acute osseous abnormality. IMPRESSION: 1. Low lung volumes with patchy opacity at the right costophrenic angle and blunting of the right lateral costophrenic angle, which may reflect atelectasis/scarring and/or a small pleural  effusion. 2. Calcified aorta consistent with atherosclerosis. 3. Old healed right distal clavicle fracture. Electronically signed by: Evalene Coho MD 03/17/2024 12:22 PM EST RP Workstation: [id number]   CT Head Wo Contrast Result Date: 03/17/2024 EXAM: CT HEAD WITHOUT CONTRAST 03/17/2024 12:16:36 PM TECHNIQUE: CT of the head was performed without the administration of intravenous contrast. Automated exposure control, iterative reconstruction, and/or weight based adjustment of the mA/kV was utilized to reduce the radiation dose to as low as reasonably achievable. COMPARISON: 11/05/2023 CLINICAL HISTORY: Fall. FINDINGS: BRAIN AND VENTRICLES: No acute hemorrhage. No evidence of acute infarct. No hydrocephalus. No extra-axial collection. No mass effect or midline shift. Stable atrophy and mild chronic small vessel ischemia from prior. ORBITS: No acute abnormality. Bilateral cataract resection. SINUSES: No acute abnormality. SOFT TISSUES AND SKULL: No acute soft tissue abnormality. No skull fracture. Atherosclerosis of skullbase vasculature without hyperdense vessel or abnormal calcification. IMPRESSION: 1. No acute intracranial abnormality related to the fall. 2. Stable atrophy and mild chronic small vessel ischemia from prior. Electronically signed by: Evalene Coho MD 03/17/2024 12:22 PM EST RP Workstation: [id number]    EKG: My personal interpretation of EKG shows: atrial fibrillation without any acute ST changes. Rate in 70s.     Assessment/Plan Principal Problem:   Weakness generalized Active Problems:   MGUS (monoclonal gammopathy of unknown significance)   Thrombocytopenia   CLL  (chronic lymphocytic leukemia) (HCC)   Hypothyroidism   Benign essential hypertension   Type II diabetes mellitus (HCC)   Hyperlipidemia, unspecified   Atrial fibrillation (HCC)   CKD (chronic kidney disease), stage III (HCC)   Dementia without behavioral disturbance, psychotic disturbance, mood disturbance, or anxiety (HCC)   Hematuria   Pt with generalized weakness secondary to COVID 19 infection. Currently on room air. Has some coughing. Given multiple co-morbidities plan was to start remdesivir but per pharmacy pt does not meet criteria given he has not oxygen requirement. Will continue supportive care, and if he has oxygen requirement, will start remdesivir. Given increased weakness, will place consult for PT/OT to start tomorrow.   Dementia:Appears moderate to severe. There appears to be acute worsening given covid infection.  Delirium precautions ordered. Continue home donepezil .   Atrial fibrillation: follows with cardiology. Continue home apixaban . Rate controlled. Will continue home beta blocker. Given fall will need to discuss with wife risk of continuing anticoagulation.   Hematuria: elevated RBC noted on UA. No report of dysuria. This can be repeated outpatient and if it remains elevated then can discuss with pt and wife regarding cystoscopy. He was previously seeing urology for his prostate cancer but has not seen in the last few years.   Hypertension/CHF: Continue home propanolol and lasix.   MDD: continue home SSRI.   T2DM: Start SSI.   Neuropathy: continue home gabapentin .   Hypothyroidism: continue home levothyroxine  50 mcg. Ordered TSH testing.\  GERD: continue home PPI.  Macrocytic anemia: appears at baseline. Folic acid  and B12 level ordered.   CLL/MGUS: pt follows with oncology. They recommend q6 month follow up.   VTE prophylaxis:  Eliquis   Diet: HH/Carb Code Status:  Full Code Telemetry:  Admission status: Observation, Telemetry bed Patient is from:  Home Anticipated d/c is to: Home Anticipated d/c is in: 1-2 days   Family Communication: Updated at bedside  Consults called: None   Severity of Illness: The appropriate patient status for this patient is OBSERVATION. Observation status is judged to be reasonable and necessary in order to provide the required intensity of service to ensure  the patient's safety. The patient's presenting symptoms, physical exam findings, and initial radiographic and laboratory data in the context of their medical condition is felt to place them at decreased risk for further clinical deterioration. Furthermore, it is anticipated that the patient will be medically stable for discharge from the hospital within 2 midnights of admission.    Morene Bathe, MD [facility]. [facility]     [1]  Allergies Allergen Reactions   Oxycodone -Acetaminophen  Other (See Comments)    Other reaction(s): Hallucinations     Voltaren [Diclofenac Sodium] Palpitations   "

## 2024-03-18 DIAGNOSIS — R531 Weakness: Secondary | ICD-10-CM | POA: Diagnosis not present

## 2024-03-18 LAB — CBC
HCT: 38.1 % — ABNORMAL LOW (ref 39.0–52.0)
Hemoglobin: 12.3 g/dL — ABNORMAL LOW (ref 13.0–17.0)
MCH: 34.2 pg — ABNORMAL HIGH (ref 26.0–34.0)
MCHC: 32.3 g/dL (ref 30.0–36.0)
MCV: 105.8 fL — ABNORMAL HIGH (ref 80.0–100.0)
Platelets: 101 10*3/uL — ABNORMAL LOW (ref 150–400)
RBC: 3.6 MIL/uL — ABNORMAL LOW (ref 4.22–5.81)
RDW: 13.3 % (ref 11.5–15.5)
WBC: 8.3 10*3/uL (ref 4.0–10.5)
nRBC: 0 % (ref 0.0–0.2)

## 2024-03-18 LAB — GLUCOSE, CAPILLARY
Glucose-Capillary: 142 mg/dL — ABNORMAL HIGH (ref 70–99)
Glucose-Capillary: 120 mg/dL — ABNORMAL HIGH (ref 70–99)
Glucose-Capillary: 151 mg/dL — ABNORMAL HIGH (ref 70–99)
Glucose-Capillary: 123 mg/dL — ABNORMAL HIGH (ref 70–99)

## 2024-03-18 LAB — BASIC METABOLIC PANEL WITH GFR
Anion gap: 8 (ref 5–15)
BUN: 20 mg/dL (ref 8–23)
CO2: 28 mmol/L (ref 22–32)
Calcium: 9 mg/dL (ref 8.9–10.3)
Chloride: 101 mmol/L (ref 98–111)
Creatinine, Ser: 1.17 mg/dL (ref 0.61–1.24)
GFR, Estimated: 60 mL/min — ABNORMAL LOW
Glucose, Bld: 115 mg/dL — ABNORMAL HIGH (ref 70–99)
Potassium: 3.7 mmol/L (ref 3.5–5.1)
Sodium: 136 mmol/L (ref 135–145)

## 2024-03-18 LAB — HEMOGLOBIN A1C
Hgb A1c MFr Bld: 6.2 % — ABNORMAL HIGH (ref 4.8–5.6)
Mean Plasma Glucose: 131.24 mg/dL

## 2024-03-18 LAB — VITAMIN B12: Vitamin B-12: 1635 pg/mL — ABNORMAL HIGH (ref 180–914)

## 2024-03-18 LAB — FOLATE: Folate: >20 ng/mL

## 2024-03-18 MED ORDER — SODIUM CHLORIDE 0.9 % IV SOLN
1.0000 g | INTRAVENOUS | Status: DC
  Administered 2024-03-18 – 2024-03-20 (×3): 1 g via INTRAVENOUS
  Filled 2024-03-18 (×4): qty 10

## 2024-03-18 MED ORDER — SODIUM CHLORIDE 0.9 % IV SOLN
100.0000 mg | Freq: Two times a day (BID) | INTRAVENOUS | Status: DC
  Administered 2024-03-18: 100 mg via INTRAVENOUS
  Filled 2024-03-18 (×2): qty 100

## 2024-03-18 NOTE — Evaluation (Signed)
 Occupational Therapy Evaluation Patient Details Name: Allen Chang MRN: [id number] DOB: 07/28/1932 Today's Date: 03/18/2024   History of Present Illness   Allen Chang is an 89yoM who comes to [facility] on 03/17/24 after a fall. URI symptoms x 1 day. Resp panel positive for COVID. PMH: HTN, DM2, AF, dementia, hypoTSH, CKD3a, CLL.     Clinical Impressions Chart reviewed to date, pt greeted semi supine in bed, oriented to self only and restless. Unsure of PLOF/PTA and pt Is a poor historian. Attempted to call emergency contacts, no answer. Pt requires assist for all mobility on this date including at least MIN A for static sitting on edge of bed. MAX A for ADLs on this date. Pt noted to cough with sip of thin liquids despite aspiration precautions, nurse/team notified. Pt is left as received, all needs met. OT will follow acutely to facilitate optimal ADL/functional mobily engagement.   VSS on RA following evaluation, HR to 110s bpm with mobilty, spo2 >90% on RA throughout      If plan is discharge home, recommend the following:   Two people to help with walking and/or transfers;Two people to help with bathing/dressing/bathroom;Supervision due to cognitive status     Functional Status Assessment   Patient has had a recent decline in their functional status and demonstrates the ability to make significant improvements in function in a reasonable and predictable amount of time.     Equipment Recommendations   Audiological Scientist for [employer]         Precautions/Restrictions   Precautions Precautions: Fall Recall of Precautions/Restrictions: Impaired Restrictions [employer] Bearing Restrictions Per Provider Order: No     Mobility Bed Mobility Overal bed mobility: Modified Independent Bed Mobility: Supine to Sit, Sit to Supine     Supine to sit: Max assist, HOB elevated, Used rails Sit to supine: Max assist        Transfers                    General transfer comment: unable to attempt on this date, unable to tolerate sitting on the edge of the bed      Balance Overall balance assessment: Needs assistance Sitting-balance support: Feet supported Sitting balance-Leahy Scale: Poor                                     ADL either performed or assessed with clinical judgement   ADL Overall ADL's : Needs assistance/impaired Eating/Feeding: Maximal assistance;Sitting   Grooming: Maximal assistance;Wash/dry face;Sitting               Lower Body Dressing: Maximal assistance;Bed level                       Vision   Additional Comments: will continue to assess     Perception         Praxis         Pertinent Vitals/Pain Pain Assessment Pain Assessment: PAINAD Breathing: normal Negative Vocalization: occasional moan/groan, low speech, negative/disapproving quality Facial Expression: sad, frightened, frown Body Language: tense, distressed pacing, fidgeting Consolability: distracted or reassured by voice/touch PAINAD Score: 4 Pain Intervention(s): Monitored during session, Repositioned     Extremity/Trunk Assessment Upper Extremity Assessment Upper Extremity Assessment: Generalized weakness;Difficult to assess due to impaired cognition   Lower Extremity Assessment Lower Extremity Assessment: Generalized weakness;Difficult to assess due to impaired  cognition       Communication Communication Communication: Impaired Factors Affecting Communication: Reduced clarity of speech   Cognition Arousal: Alert Behavior During Therapy: Flat affect Cognition: No family/caregiver present to determine baseline, Cognition impaired, History of cognitive impairments   Orientation impairments: Place, Time, Situation Awareness: Intellectual awareness impaired, Online awareness impaired Memory impairment (select all impairments): Short-term memory Attention impairment (select first level of  impairment): Focused attention Executive functioning impairment (select all impairments): Initiation, Organization, Sequencing, Reasoning, Problem solving                   Following commands: Impaired Following commands impaired: Follows one step commands inconsistently     Cueing  General Comments   Cueing Techniques: Verbal cues;Gestural cues;Tactile cues;Visual cues  HR to 110s bpm after mobility   Exercises Other Exercises Other Exercises: edu re role of OT, role of rehab   Shoulder Instructions      Home Living Family/patient expects to be discharged to:: Private residence Living Arrangements: Spouse/significant other Available Help at Discharge: Family;Available 24 hours/day   Home Access: Stairs to enter Entrance Stairs-Number of Steps: 3-4 steps Entrance Stairs-Rails: Right Home Layout: Two level         Bathroom Toilet: Handicapped height         Additional Comments: info per chart, pt is a poor historian      Prior Functioning/Environment Prior Level of Function : Needs assist;Patient poor historian/Family not available;History of Falls (last six months)             Mobility Comments: pt is a poor historian, will need to confirm ADLs Comments: pt is a poor historian, will need to confirm    OT Problem List: Decreased strength;Decreased activity tolerance;Impaired balance (sitting and/or standing);Decreased knowledge of use of DME or AE;Decreased safety awareness;Cardiopulmonary status limiting activity   OT Treatment/Interventions: Self-care/ADL training;Therapeutic exercise;Energy conservation;DME and/or AE instruction;Therapeutic activities;Patient/family education;Balance training      OT Goals(Current goals can be found in the care plan section)   Acute Rehab OT Goals Patient Stated Goal: lie down OT Goal Formulation: With patient Time For Goal Achievement: 04/01/24 Potential to Achieve Goals: Fair ADL Goals Pt Will Perform  Grooming: with supervision;sitting Pt Will Perform Lower Body Dressing: with mod assist;sitting/lateral leans Pt Will Transfer to Toilet: with mod assist;stand pivot transfer;bedside commode Pt Will Perform Toileting - Clothing Manipulation and hygiene: with mod assist;sitting/lateral leans   OT Frequency:  Min 2X/week    Co-evaluation              AM-PAC OT 6 Clicks Daily Activity     Outcome Measure Help from another person eating meals?: A Lot Help from another person taking care of personal grooming?: A Lot Help from another person toileting, which includes using toliet, bedpan, or urinal?: Total Help from another person bathing (including washing, rinsing, drying)?: Total Help from another person to put on and taking off regular upper body clothing?: A Lot Help from another person to put on and taking off regular lower body clothing?: Total 6 Click Score: 9   End of Session Nurse Communication: Mobility status  Activity Tolerance: Patient tolerated treatment well Patient left: in bed;with call bell/phone within reach;with bed alarm set  OT Visit Diagnosis: Other abnormalities of gait and mobility (R26.89);Muscle weakness (generalized) (M62.81)                Time: 8598-8571 OT Time Calculation (min): 27 min Charges:  OT General Charges $OT Visit: 1 Visit  OT Evaluation $OT Eval Moderate Complexity: 1 Mod  Therisa Sheffield, OTD OTR/L  03/18/24, 3:58 PM

## 2024-03-18 NOTE — TOC CM/SW Note (Cosign Needed Addendum)
 Transition of Care (TOC) CM/SW Note   Patient will require a [employer], without the use of this the patient will be confined to the bed

## 2024-03-18 NOTE — Plan of Care (Signed)
" °  Problem: Coping: Goal: Psychosocial and spiritual needs will be supported Outcome: Progressing   Problem: Respiratory: Goal: Complications related to the disease process, condition or treatment will be avoided or minimized Outcome: Progressing   Problem: Fluid Volume: Goal: Ability to maintain a balanced intake and output will improve Outcome: Progressing   Problem: Health Behavior/Discharge Planning: Goal: Ability to identify and utilize available resources and services will improve Outcome: Progressing   "

## 2024-03-18 NOTE — Progress Notes (Signed)
 " Progress Note   Patient: Allen Chang FMW:[id number] DOB: 11/16/1932 DOA: 03/17/2024     0 DOS: the patient was seen and examined on 03/18/2024   Brief hospital course:  From HPI Allen Chang is a 89 y.o. year old male with medical history of HTN, HLD, T2DM, atrial fibrillation, dementia, hypothyroidism, CKDIIIa, CLL presenting to the ED after a fall. Fall appears to be mechanical. Pt is limited historian and unable to provide history. Per wife pt has been having URI symptoms since yesterday. He is more confused than baseline and weaker then baseline. Denies any fevers or chills. Lab work and imaging obtained. CBC without leukocytosis, baseline hgb and plt count. BMP with baseline renal function. UA without signs of infection but with increased RBC. Resp panel positive for COVID. CK normal. Troponin mildly elevated so repeat pending. CT head and C spine without any acute findings. DG pelvis without any acute findings. DG chest with right costophrenic blunting concerning for atelectasis vs small pleural effusion. Given need for continued care, TRH contacted for admission.     Assessment and Plan:   Weakness generalized secondary to COVID-19 infection PT OT requested We will give supplemental oxygen as needed Patient does not meet criteria for remdesivir Continue other supportive care PT OT recommending SNF when medically stable I will cover with empiric antibiotic to rule out any superimposed bacterial pneumonia  Chronic dementia: Appears moderate to severe.  There appears to be acute worsening given covid infection.  Delirium precautions ordered.  Continue home donepezil .    Chronic atrial fibrillation: follows with cardiology.  Continue home apixaban .  Rate controlled. Will continue home beta blocker.  Given fall will need to discuss with wife risk of continuing anticoagulation.    Hematuria: elevated RBC noted on UA. No report of dysuria. This can be repeated outpatient and if  it remains elevated then can discuss with pt and wife regarding cystoscopy. He was previously seeing urology for his prostate cancer but has not seen in the last few years.    Hypertension/CHF: Continue home propanolol and lasix .    MDD: continue home SSRI.    T2DM: Start SSI.    Neuropathy: continue home gabapentin .    Hypothyroidism: continue home levothyroxine  50 mcg. Ordered TSH testing.   GERD: continue home PPI.   Macrocytic anemia: appears at baseline. Folic acid  and B12 level ordered.    CLL/MGUS: pt follows with oncology. They recommend q6 month follow up.    VTE prophylaxis:  Eliquis   Diet: HH/Carb Code Status:  Full Code    Family Communication: Updated at bedside   Consults called: None   Subjective:  Patient seen and examined at bedside this morning Admits to having generalized weakness Still having persistent cough Denies nausea vomiting  Physical Exam: General: Elderly male laying in bed appears lethargic not on oxygen CV: S1-S2 present in atrial fibrillation Lung: rhonchi present diffusely, air entry decreased bilaterally Abd: No TTP, normal bowel sounds MSK: No asymmetry, decreased muscle bulk Neuro: alert and oriented x1, oriented to self only    Vitals:   03/17/24 1727 03/17/24 2038 03/18/24 0425 03/18/24 0834  BP: (!) 105/54 (!) 108/58 106/62 (!) 126/99  Pulse: 68 68 66 63  Resp: 16 18 18 16   Temp: 99.4 F (37.4 C) 99.7 F (37.6 C) 99.1 F (37.3 C) 98.6 F (37 C)  TempSrc: Oral Oral Oral   SpO2: 94% 96% 96% 94%    Data Reviewed:  I have personally reviewed patient chest  x-ray showing findings of patchy opacity on the right     Latest Ref Rng & Units 03/18/2024    7:26 AM 03/17/2024   10:33 AM 12/18/2023    1:21 PM  CBC  WBC 4.0 - 10.5 K/uL 8.3  9.2  9.2   Hemoglobin 13.0 - 17.0 g/dL 87.6  85.9  86.2   Hematocrit 39.0 - 52.0 % 38.1  42.4  41.6   Platelets 150 - 400 K/uL 101  125  120        Latest Ref Rng & Units 03/18/2024     7:26 AM 03/17/2024   10:33 AM 12/18/2023    1:21 PM  BMP  Glucose 70 - 99 mg/dL 884  876  883   BUN 8 - 23 mg/dL 20  21  32   Creatinine 0.61 - 1.24 mg/dL 8.82  8.69  8.74   Sodium 135 - 145 mmol/L 136  137  138   Potassium 3.5 - 5.1 mmol/L 3.7  4.1  3.9   Chloride 98 - 111 mmol/L 101  99  97   CO2 22 - 32 mmol/L 28  27  30    Calcium  8.9 - 10.3 mg/dL 9.0  9.9  9.8       Author: Drue ONEIDA Potter, MD 03/18/2024 5:47 PM  For on call review [email or web address]  "

## 2024-03-19 DIAGNOSIS — R531 Weakness: Secondary | ICD-10-CM | POA: Diagnosis not present

## 2024-03-19 LAB — CBC
HCT: 37.7 % — ABNORMAL LOW (ref 39.0–52.0)
Hemoglobin: 12.6 g/dL — ABNORMAL LOW (ref 13.0–17.0)
MCH: 35 pg — ABNORMAL HIGH (ref 26.0–34.0)
MCHC: 33.4 g/dL (ref 30.0–36.0)
MCV: 104.7 fL — ABNORMAL HIGH (ref 80.0–100.0)
Platelets: 94 10*3/uL — ABNORMAL LOW (ref 150–400)
RBC: 3.6 MIL/uL — ABNORMAL LOW (ref 4.22–5.81)
RDW: 13.2 % (ref 11.5–15.5)
WBC: 7.6 10*3/uL (ref 4.0–10.5)
nRBC: 0 % (ref 0.0–0.2)

## 2024-03-19 LAB — GLUCOSE, CAPILLARY
Glucose-Capillary: 139 mg/dL — ABNORMAL HIGH (ref 70–99)
Glucose-Capillary: 186 mg/dL — ABNORMAL HIGH (ref 70–99)
Glucose-Capillary: 94 mg/dL (ref 70–99)
Glucose-Capillary: 102 mg/dL — ABNORMAL HIGH (ref 70–99)

## 2024-03-19 LAB — BASIC METABOLIC PANEL WITH GFR
Anion gap: 11 (ref 5–15)
BUN: 19 mg/dL (ref 8–23)
CO2: 26 mmol/L (ref 22–32)
Calcium: 8.7 mg/dL — ABNORMAL LOW (ref 8.9–10.3)
Chloride: 101 mmol/L (ref 98–111)
Creatinine, Ser: 0.98 mg/dL (ref 0.61–1.24)
GFR, Estimated: >60 mL/min
Glucose, Bld: 127 mg/dL — ABNORMAL HIGH (ref 70–99)
Potassium: 3.8 mmol/L (ref 3.5–5.1)
Sodium: 137 mmol/L (ref 135–145)

## 2024-03-19 MED ORDER — DOXYCYCLINE HYCLATE 100 MG PO TABS: 100.0000 mg | ORAL_TABLET | Freq: Two times a day (BID) | ORAL | Status: DC

## 2024-03-19 MED ORDER — DOXYCYCLINE HYCLATE 100 MG PO TABS
100.0000 mg | ORAL_TABLET | Freq: Two times a day (BID) | ORAL | Status: DC
  Administered 2024-03-19 – 2024-03-21 (×5): 100 mg via ORAL
  Filled 2024-03-19 (×5): qty 1

## 2024-03-19 NOTE — TOC Progression Note (Addendum)
 Transition of Care [facility]) - Progression Note    Patient Details  Name: Allen Chang MRN: [id number] Date of Birth: [birth year]  Transition of Care [facility]) CM/SW Contact  Lorraine LILLETTE Fenton, LCSW Phone Number: 03/19/2024, 11:49 AM  Clinical Narrative:    CSW spoke with daughter Delon.    Spouse now wants STR for pt, initially spouse did not want him to DC to SNF.  Family is interested in [place] resources for SNF.  Bed requested PASSR will be initiated for planning and FL2. ICM following.  Addendum: PASSR still screening, need clinicals uploaded to continue.   Expected Discharge Plan: Skilled Nursing Facility Barriers to Discharge: Continued Medical Work up               Expected Discharge Plan and Services In-house Referral: Clinical Social Work     Living arrangements for the past 2 months: Single Family Home                                       Social Drivers of Health (SDOH) Interventions SDOH Screenings   Food Insecurity: No Food Insecurity (03/18/2024)  Housing: Unknown (03/18/2024)  Transportation Needs: No Transportation Needs (03/18/2024)  Utilities: Not At Risk (03/18/2024)  Financial Resource Strain: Low Risk  (09/14/2023)   Received from [facility]  Social Connections: Moderately Integrated (03/18/2024)  Tobacco Use: Low Risk (03/17/2024)    Readmission Risk Interventions     No data to display

## 2024-03-19 NOTE — Progress Notes (Signed)
 " Progress Note   Patient: Allen Chang FMW:[id number] DOB: 07/08/1932 DOA: 03/17/2024     1 DOS: the patient was seen and examined on 03/19/2024     Brief hospital course:  From HPI Allen Chang is a 89 y.o. year old male with medical history of HTN, HLD, T2DM, atrial fibrillation, dementia, hypothyroidism, CKDIIIa, CLL presenting to the ED after a fall. Fall appears to be mechanical. Pt is limited historian and unable to provide history. Per wife pt has been having URI symptoms since yesterday. He is more confused than baseline and weaker then baseline. Denies any fevers or chills. Lab work and imaging obtained. CBC without leukocytosis, baseline hgb and plt count. BMP with baseline renal function. UA without signs of infection but with increased RBC. Resp panel positive for COVID. CK normal. Troponin mildly elevated so repeat pending. CT head and C spine without any acute findings. DG pelvis without any acute findings. DG chest with right costophrenic blunting concerning for atelectasis vs small pleural effusion. Given need for continued care, TRH contacted for admission.       Assessment and Plan:    Weakness generalized secondary to COVID-19 infection PT OT requested We will give supplemental oxygen as needed Patient does not meet criteria for remdesivir Continue other supportive care PT OT recommending SNF when medically stable I will cover with empiric antibiotic to rule out any superimposed bacterial pneumonia  Chronic dementia: Appears moderate to severe.  There appears to be acute worsening given covid infection.  Delirium precautions ordered.  Continue home donepezil .    Chronic atrial fibrillation: follows with cardiology.  Continue home apixaban .  Rate controlled. Will continue home beta blocker.  Given fall will need to discuss with wife risk of continuing anticoagulation.    Hematuria: elevated RBC noted on UA. No report of dysuria. This can be repeated outpatient  and if it remains elevated then can discuss with pt and wife regarding cystoscopy. He was previously seeing urology for his prostate cancer but has not seen in the last few years.    Hypertension/CHF: Continue home propanolol and lasix .    MDD: continue home SSRI.    T2DM: Start SSI.    Neuropathy: continue home gabapentin .    Hypothyroidism: continue home levothyroxine  50 mcg. Ordered TSH testing.   GERD: continue home PPI.   Macrocytic anemia: appears at baseline. Folic acid  and B12 level ordered.    CLL/MGUS: pt follows with oncology. They recommend q6 month follow up.    VTE prophylaxis:  Eliquis   Diet: HH/Carb Code Status:  Full Code     Family Communication: Updated at bedside   Consults called: None     Subjective:  Patient seen and examined at bedside this morning Still appears lethargic PT OT recommending SNF placement   Physical Exam: General: Elderly male laying in bed appears lethargic not on oxygen CV: S1-S2 present in atrial fibrillation Lung: rhonchi present diffusely, air entry decreased bilaterally Abd: No TTP, normal bowel sounds MSK: No asymmetry, decreased muscle bulk Neuro: alert and oriented x1, oriented to self only    Vitals:   03/19/24 0448 03/19/24 0814 03/19/24 0940 03/19/24 1700  BP: (!) 138/90 (!) 148/77 103/65 135/80  Pulse: 62 61 77 (!) 52  Resp: 20 17 15 18   Temp: 98.6 F (37 C) 98.7 F (37.1 C) 99.6 F (37.6 C) 99.3 F (37.4 C)  TempSrc:    Oral  SpO2: 91% 91% 93% 100%      Latest Ref  Rng & Units 03/19/2024    8:36 AM 03/18/2024    7:26 AM 03/17/2024   10:33 AM  CBC  WBC 4.0 - 10.5 K/uL 7.6  8.3  9.2   Hemoglobin 13.0 - 17.0 g/dL 87.3  87.6  85.9   Hematocrit 39.0 - 52.0 % 37.7  38.1  42.4   Platelets 150 - 400 K/uL 94  101  125      Author: Drue ONEIDA Potter, MD 03/19/2024 7:12 PM  For on call review [email or web address].  "

## 2024-03-19 NOTE — NC FL2 (Signed)
 " [place]  MEDICAID FL2 LEVEL OF CARE FORM     IDENTIFICATION  Patient Name: Allen Chang Birthdate: [birth year] Sex: male Admission Date (Current Location): 03/17/2024  [facility] and [place] Number:  Chiropodist and Address:  [facility], [address] [place], [place] [address]      Provider Number: [id number]  Attending Physician Name and Address:  Dorinda Drue DASEN, MD  Relative Name and Phone Number:  Bram, Hottel, Emergency Contact  [phone] [facility] Phone)    Current Level of Care: Hospital Recommended Level of Care: Skilled Nursing Facility Prior Approval Number:    Date Approved/Denied:   PASRR Number: Screening  Discharge Plan: SNF    Current Diagnoses: Patient Active Problem List   Diagnosis Date Noted   Weakness generalized 03/17/2024   Hematuria 12/18/2023   Senile purpura 09/09/2023   Need for prophylactic vaccination and inoculation against influenza 11/18/2022   Leukopenia 09/16/2022   MGUS (monoclonal gammopathy of unknown significance) 07/28/2021   Hyponatremia    Dehydration    Dementia without behavioral disturbance, psychotic disturbance, mood disturbance, or anxiety (HCC)    CAP (community acquired pneumonia) 02/03/2021   Status post right partial knee replacement 09/25/2020   Lower GI bleed 05/03/2020   CKD (chronic kidney disease), stage III (HCC) 05/03/2020   Hiatal hernia 12/16/2019   Postural dizziness with presyncope 12/16/2019   Atrial fibrillation (HCC) 12/16/2019   Chest pain 12/16/2019   Encounter for antineoplastic chemotherapy 10/22/2018   Goals of care, counseling/discussion 09/28/2018   Thrombocytopenia 10/27/2016   Iron  deficiency 10/27/2016   CLL (chronic lymphocytic leukemia) (HCC) 10/27/2016   Macrocytic anemia 10/27/2016   Hypothyroidism 10/01/2016   Chronic kidney disease (CKD) stage G3a/A1, moderately decreased glomerular filtration rate (GFR) between 45-59 mL/min/1.73  square meter and albuminuria creatinine ratio less than 30 mg/g (HCC) 10/01/2016   Status post left partial knee replacement 12/12/2014   Bradycardia 10/10/2014   Severe sinus bradycardia 10/10/2014   Benign essential hypertension 05/02/2013   Type II diabetes mellitus (HCC) 05/02/2013   Hyperlipidemia, unspecified 05/02/2013    Orientation RESPIRATION BLADDER Height & Weight     Self  Normal Continent Weight:   Height:     BEHAVIORAL SYMPTOMS/MOOD NEUROLOGICAL BOWEL NUTRITION STATUS      Continent Diet  AMBULATORY STATUS COMMUNICATION OF NEEDS Skin   Limited Assist Verbally Normal                       Personal Care Assistance Level of Assistance  Bathing, Feeding, Dressing Bathing Assistance: Limited assistance Feeding assistance: Limited assistance Dressing Assistance: Limited assistance     Functional Limitations Info  Sight, Hearing Sight Info: Adequate Hearing Info: Adequate      SPECIAL CARE FACTORS FREQUENCY  PT (By licensed PT), OT (By licensed OT)     PT Frequency: 5X a week OT Frequency: 5 X a week            Contractures      Additional Factors Info  Code Status, Allergies Code Status Info: Full Allergies Info: Allergies: Oxycodone -acetaminophen , Voltaren (Diclofenac Sodium)           Current Medications (03/19/2024):  This is the current hospital active medication list Current Facility-Administered Medications  Medication Dose Route Frequency Provider Last Rate Last Admin   acetaminophen  (TYLENOL ) tablet 650 mg  650 mg Oral Q6H PRN Khan, Ghalib, MD   650 mg at 03/17/24 2216   Or  acetaminophen  (TYLENOL ) suppository 650 mg  650 mg Rectal Q6H PRN Khan, Ghalib, MD       apixaban  (ELIQUIS ) tablet 5 mg  5 mg Oral BID Khan, Ghalib, MD   5 mg at 03/19/24 0957   cefTRIAXone  (ROCEPHIN ) 1 g in sodium chloride  0.9 % 100 mL IVPB  1 g Intravenous Q24H Dorinda Homans T, MD 200 mL/hr at 03/18/24 2054 1 g at 03/18/24 2054   doxycycline  (VIBRA -TABS)  tablet 100 mg  100 mg Oral Q12H Djan, Prince T, MD   100 mg at 03/19/24 0957   insulin  aspart (novoLOG ) injection 0-5 Units  0-5 Units Subcutaneous QHS Khan, Ghalib, MD       insulin  aspart (novoLOG ) injection 0-9 Units  0-9 Units Subcutaneous TID WC Khan, Ghalib, MD   1 Units at 03/19/24 0930   levothyroxine  (SYNTHROID ) tablet 50 mcg  50 mcg Oral Once per day on Monday Tuesday Wednesday Thursday Friday Fernand Prost, MD   50 mcg at 03/18/24 9388   levothyroxine  (SYNTHROID ) tablet 75 mcg  75 mcg Oral Once per day on Sunday Saturday Fernand Prost, MD   75 mcg at 03/19/24 9375   ondansetron  (ZOFRAN ) tablet 4 mg  4 mg Oral Q6H PRN Fernand Prost, MD       Or   ondansetron  (ZOFRAN ) injection 4 mg  4 mg Intravenous Q6H PRN Fernand Prost, MD       pantoprazole  (PROTONIX ) EC tablet 40 mg  40 mg Oral BID Khan, Ghalib, MD   40 mg at 03/19/24 9041   propranolol  (INDERAL ) tablet 10 mg  10 mg Oral BID Khan, Ghalib, MD   10 mg at 03/19/24 9043   senna-docusate (Senokot-S) tablet 1 tablet  1 tablet Oral QHS PRN Khan, Ghalib, MD   1 tablet at 03/17/24 2146   sodium chloride  flush (NS) 0.9 % injection 3 mL  3 mL Intravenous Q12H Khan, Ghalib, MD   3 mL at 03/19/24 0958   timolol  (TIMOPTIC ) 0.5 % ophthalmic solution 1 drop  1 drop Both Eyes Daily Fernand Prost, MD   1 drop at 03/19/24 9041     Discharge Medications: Please see discharge summary for a list of discharge medications.  Relevant Imaging Results:  Relevant Lab Results:   Additional Information [id number]  Lorraine LILLETTE Fenton, LCSW     "

## 2024-03-20 DIAGNOSIS — R531 Weakness: Secondary | ICD-10-CM | POA: Diagnosis not present

## 2024-03-20 LAB — GLUCOSE, CAPILLARY
Glucose-Capillary: 104 mg/dL — ABNORMAL HIGH (ref 70–99)
Glucose-Capillary: 143 mg/dL — ABNORMAL HIGH (ref 70–99)
Glucose-Capillary: 103 mg/dL — ABNORMAL HIGH (ref 70–99)
Glucose-Capillary: 121 mg/dL — ABNORMAL HIGH (ref 70–99)

## 2024-03-20 MED ORDER — BENZONATATE 100 MG PO CAPS
200.0000 mg | ORAL_CAPSULE | Freq: Three times a day (TID) | ORAL | Status: DC | PRN
  Administered 2024-03-21 (×2): 200 mg via ORAL
  Filled 2024-03-20 (×2): qty 2

## 2024-03-20 NOTE — Plan of Care (Signed)
  Problem: Respiratory: Goal: Will maintain a patent airway Outcome: Progressing   

## 2024-03-20 NOTE — Progress Notes (Signed)
 " Progress Note   Patient: Allen Chang FMW:[id number] DOB: May 23, 1932 DOA: 03/17/2024     2 DOS: the patient was seen and examined on 03/20/2024      Brief hospital course:  From HPI Allen Chang is a 89 y.o. year old male with medical history of HTN, HLD, T2DM, atrial fibrillation, dementia, hypothyroidism, CKDIIIa, CLL presenting to the ED after a fall. Fall appears to be mechanical. Pt is limited historian and unable to provide history. Per wife pt has been having URI symptoms since yesterday. He is more confused than baseline and weaker then baseline. Denies any fevers or chills. Lab work and imaging obtained. CBC without leukocytosis, baseline hgb and plt count. BMP with baseline renal function. UA without signs of infection but with increased RBC. Resp panel positive for COVID. CK normal. Troponin mildly elevated so repeat pending. CT head and C spine without any acute findings. DG pelvis without any acute findings. DG chest with right costophrenic blunting concerning for atelectasis vs small pleural effusion. Given need for continued care, [facility] contacted for admission.       Assessment and Plan:    Weakness generalized secondary to COVID-19 infection Continue PT OT We will give supplemental oxygen as needed Patient does not meet criteria for remdesivir Continue other supportive care Patient pending SNF placement Continue empiric antibiotics for any superimposed bacterial pneumonia  Chronic dementia: Appears moderate to severe.  Continue delirium precaution Continue home donepezil .    Chronic atrial fibrillation: follows with cardiology.  Continue home apixaban .  Rate controlled. Will continue home beta blocker.  Given fall will need to discuss with wife risk of continuing anticoagulation.    Hematuria: elevated RBC noted on UA. No report of dysuria. This can be repeated outpatient and if it remains elevated then can discuss with pt and wife regarding cystoscopy. He was  previously seeing urology for his prostate cancer but has not seen in the last few years.    Hypertension/CHF: Continue home propanolol and lasix .    MDD: continue home SSRI.    T2DM: Start SSI.    Neuropathy: continue home gabapentin .    Hypothyroidism: continue home levothyroxine  50 mcg. Ordered TSH testing.   GERD: continue home PPI.   Macrocytic anemia: appears at baseline. Folic acid  and B12 level ordered.    CLL/MGUS: pt follows with oncology. They recommend q6 month follow up.    VTE prophylaxis:  Eliquis   Diet: HH/Carb Code Status:  Full Code     Family Communication: Updated at bedside   Consults called: None     Subjective:  Patient seen and examined at bedside this morning Still appears lethargic PT OT recommending SNF placement Denies any acute complaints   Physical Exam: General: Elderly male laying in bed appears lethargic not on oxygen CV: S1-S2 present in atrial fibrillation Lung: rhonchi present diffusely, air entry decreased bilaterally Abd: No TTP, normal bowel sounds MSK: No asymmetry, decreased muscle bulk Neuro: alert and oriented x1, oriented to self only   Data reviewed    Latest Ref Rng & Units 03/19/2024    8:36 AM 03/18/2024    7:26 AM 03/17/2024   10:33 AM  CBC  WBC 4.0 - 10.5 K/uL 7.6  8.3  9.2   Hemoglobin 13.0 - 17.0 g/dL 87.3  87.6  85.9   Hematocrit 39.0 - 52.0 % 37.7  38.1  42.4   Platelets 150 - 400 K/uL 94  101  125        Latest  Ref Rng & Units 03/19/2024    8:36 AM 03/18/2024    7:26 AM 03/17/2024   10:33 AM  BMP  Glucose 70 - 99 mg/dL 872  884  876   BUN 8 - 23 mg/dL 19  20  21    Creatinine 0.61 - 1.24 mg/dL 9.01  8.82  8.69   Sodium 135 - 145 mmol/L 137  136  137   Potassium 3.5 - 5.1 mmol/L 3.8  3.7  4.1   Chloride 98 - 111 mmol/L 101  101  99   CO2 22 - 32 mmol/L 26  28  27    Calcium  8.9 - 10.3 mg/dL 8.7  9.0  9.9      Vitals:   03/20/24 0000 03/20/24 0442 03/20/24 0800 03/20/24 1600  BP: (!) 144/98 (!) 158/71  (!) 157/93 (!) 148/97  Pulse: (!) 52 60 80 88  Resp: 16 17 19 20   Temp: 97.8 F (36.6 C) 98.5 F (36.9 C) 97.7 F (36.5 C) 98.3 F (36.8 C)  TempSrc: Oral  Axillary Oral  SpO2: 94% 97% 99% 98%     Author: Drue ONEIDA Potter, MD 03/20/2024 5:35 PM  For on call review [email or web address].  "

## 2024-03-20 NOTE — Plan of Care (Signed)
   Problem: Education: Goal: Knowledge of risk factors and measures for prevention of condition will improve Outcome: Progressing

## 2024-03-21 ENCOUNTER — Encounter: Payer: Self-pay | Admitting: Oncology

## 2024-03-21 ENCOUNTER — Other Ambulatory Visit: Payer: Self-pay

## 2024-03-21 DIAGNOSIS — R531 Weakness: Secondary | ICD-10-CM | POA: Diagnosis not present

## 2024-03-21 LAB — GLUCOSE, CAPILLARY
Glucose-Capillary: 100 mg/dL — ABNORMAL HIGH (ref 70–99)
Glucose-Capillary: 120 mg/dL — ABNORMAL HIGH (ref 70–99)
Glucose-Capillary: 173 mg/dL — ABNORMAL HIGH (ref 70–99)

## 2024-03-21 MED ORDER — SENNOSIDES-DOCUSATE SODIUM 8.6-50 MG PO TABS
1.0000 | ORAL_TABLET | Freq: Every evening | ORAL | 0 refills | Status: AC | PRN
  Filled 2024-03-21: qty 5, 5d supply, fill #0

## 2024-03-21 MED ORDER — PROPRANOLOL HCL 10 MG PO TABS
10.0000 mg | ORAL_TABLET | Freq: Two times a day (BID) | ORAL | 0 refills | Status: AC
  Filled 2024-03-21: qty 10, 5d supply, fill #0

## 2024-03-21 MED ORDER — LATANOPROST 0.005 % OP SOLN
1.0000 [drp] | Freq: Every day | OPHTHALMIC | Status: DC
  Filled 2024-03-21: qty 2.5

## 2024-03-21 MED ORDER — TIMOLOL MALEATE 0.5 % OP SOLN
1.0000 [drp] | Freq: Every day | OPHTHALMIC | 0 refills | Status: AC
  Filled 2024-03-21: qty 5, 30d supply, fill #0

## 2024-03-21 MED ORDER — LEVOTHYROXINE SODIUM 75 MCG PO TABS
75.0000 ug | ORAL_TABLET | Freq: Every day | ORAL | 0 refills | Status: AC
  Filled 2024-03-21: qty 2, 2d supply, fill #0

## 2024-03-21 MED ORDER — DONEPEZIL HCL 5 MG PO TABS: 10.0000 mg | ORAL_TABLET | Freq: Every day | ORAL | Status: DC

## 2024-03-21 MED ORDER — APIXABAN 5 MG PO TABS
5.0000 mg | ORAL_TABLET | Freq: Two times a day (BID) | ORAL | 0 refills | Status: AC
  Filled 2024-03-21: qty 10, 5d supply, fill #0

## 2024-03-21 MED ORDER — DULOXETINE HCL 20 MG PO CPEP
20.0000 mg | ORAL_CAPSULE | Freq: Every day | ORAL | Status: DC
  Administered 2024-03-21: 20 mg via ORAL
  Filled 2024-03-21: qty 1

## 2024-03-21 MED ORDER — FUROSEMIDE 20 MG PO TABS
20.0000 mg | ORAL_TABLET | Freq: Every day | ORAL | Status: DC
  Administered 2024-03-21: 20 mg via ORAL
  Filled 2024-03-21: qty 1

## 2024-03-21 MED ORDER — AMOXICILLIN-POT CLAVULANATE 875-125 MG PO TABS
1.0000 | ORAL_TABLET | Freq: Two times a day (BID) | ORAL | 0 refills | Status: AC
  Filled 2024-03-21: qty 10, 5d supply, fill #0

## 2024-03-21 MED ORDER — PANTOPRAZOLE SODIUM 40 MG PO TBEC
40.0000 mg | DELAYED_RELEASE_TABLET | Freq: Every day | ORAL | 0 refills | Status: AC
  Filled 2024-03-21: qty 5, 5d supply, fill #0

## 2024-03-21 MED ORDER — ZIPRASIDONE HCL 20 MG PO CAPS
20.0000 mg | ORAL_CAPSULE | Freq: Two times a day (BID) | ORAL | Status: DC
  Administered 2024-03-21: 20 mg via ORAL
  Filled 2024-03-21: qty 1

## 2024-03-21 MED ORDER — GABAPENTIN 100 MG PO CAPS
100.0000 mg | ORAL_CAPSULE | Freq: Every day | ORAL | 0 refills | Status: AC
  Filled 2024-03-21: qty 5, 5d supply, fill #0

## 2024-03-21 MED ORDER — BENZONATATE 200 MG PO CAPS
200.0000 mg | ORAL_CAPSULE | Freq: Three times a day (TID) | ORAL | 0 refills | Status: AC | PRN
  Filled 2024-03-21: qty 15, 5d supply, fill #0

## 2024-03-21 MED ORDER — GABAPENTIN 100 MG PO CAPS: 100.0000 mg | ORAL_CAPSULE | Freq: Every day | ORAL | Status: DC

## 2024-03-21 MED ORDER — FUROSEMIDE 20 MG PO TABS
20.0000 mg | ORAL_TABLET | Freq: Every day | ORAL | 1 refills | Status: AC
  Filled 2024-03-21: qty 5, 5d supply, fill #0

## 2024-03-21 MED ORDER — DULOXETINE HCL 20 MG PO CPEP
20.0000 mg | ORAL_CAPSULE | Freq: Every day | ORAL | 0 refills | Status: AC
  Filled 2024-03-21: qty 5, 5d supply, fill #0

## 2024-03-21 MED ORDER — DONEPEZIL HCL 10 MG PO TABS
10.0000 mg | ORAL_TABLET | Freq: Every day | ORAL | 0 refills | Status: AC
  Filled 2024-03-21: qty 5, 5d supply, fill #0

## 2024-03-21 MED ORDER — LATANOPROST 0.005 % OP SOLN
1.0000 [drp] | Freq: Every day | OPHTHALMIC | 0 refills | Status: AC
  Filled 2024-03-21: qty 2.5, 5d supply, fill #0

## 2024-03-21 MED ORDER — LEVOTHYROXINE SODIUM 50 MCG PO TABS
50.0000 ug | ORAL_TABLET | Freq: Every day | ORAL | 0 refills | Status: AC
  Filled 2024-03-21: qty 5, 5d supply, fill #0

## 2024-03-21 MED ORDER — FERROUS SULFATE 325 (65 FE) MG PO TABS
325.0000 mg | ORAL_TABLET | Freq: Three times a day (TID) | ORAL | 0 refills | Status: AC
  Filled 2024-03-21: qty 15, 5d supply, fill #0

## 2024-03-21 NOTE — Progress Notes (Signed)
 Patient discharged to SNF - [facility] with all belongings with [employer] Officer. All care transferred.

## 2024-03-21 NOTE — Progress Notes (Signed)
 Report given to Fatmata, CHARITY FUNDRAISER from [facility] via phone. Awaiting transport.

## 2024-03-21 NOTE — Progress Notes (Signed)
 Attempted to call SNF, no answer yet. Will print all paperwork and send with transport. Will make another attempt for report soon.

## 2024-04-15 ENCOUNTER — Inpatient Hospital Stay

## 2024-04-15 ENCOUNTER — Inpatient Hospital Stay: Admitting: Oncology
